# Patient Record
Sex: Male | Born: 1955
Health system: Southern US, Community
[De-identification: ages and names within clinical notes are randomized; demographics above are authoritative.]

## PROBLEM LIST (undated history)

## (undated) DIAGNOSIS — Z87442 Personal history of urinary calculi: Secondary | ICD-10-CM

## (undated) DIAGNOSIS — E46 Unspecified protein-calorie malnutrition: Secondary | ICD-10-CM

## (undated) DIAGNOSIS — Z9221 Personal history of antineoplastic chemotherapy: Secondary | ICD-10-CM

## (undated) DIAGNOSIS — M1990 Unspecified osteoarthritis, unspecified site: Secondary | ICD-10-CM

## (undated) DIAGNOSIS — F329 Major depressive disorder, single episode, unspecified: Secondary | ICD-10-CM

## (undated) DIAGNOSIS — R011 Cardiac murmur, unspecified: Secondary | ICD-10-CM

## (undated) DIAGNOSIS — N289 Disorder of kidney and ureter, unspecified: Secondary | ICD-10-CM

## (undated) DIAGNOSIS — Z923 Personal history of irradiation: Secondary | ICD-10-CM

## (undated) DIAGNOSIS — R0600 Dyspnea, unspecified: Secondary | ICD-10-CM

## (undated) DIAGNOSIS — R682 Dry mouth, unspecified: Secondary | ICD-10-CM

## (undated) DIAGNOSIS — E039 Hypothyroidism, unspecified: Secondary | ICD-10-CM

## (undated) DIAGNOSIS — F17200 Nicotine dependence, unspecified, uncomplicated: Secondary | ICD-10-CM

## (undated) DIAGNOSIS — I2510 Atherosclerotic heart disease of native coronary artery without angina pectoris: Secondary | ICD-10-CM

## (undated) DIAGNOSIS — K117 Disturbances of salivary secretion: Secondary | ICD-10-CM

## (undated) DIAGNOSIS — C109 Malignant neoplasm of oropharynx, unspecified: Secondary | ICD-10-CM

## (undated) DIAGNOSIS — I10 Essential (primary) hypertension: Secondary | ICD-10-CM

## (undated) DIAGNOSIS — Z972 Presence of dental prosthetic device (complete) (partial): Secondary | ICD-10-CM

## (undated) DIAGNOSIS — K08109 Complete loss of teeth, unspecified cause, unspecified class: Secondary | ICD-10-CM

## (undated) HISTORY — PX: APPENDECTOMY: SHX54

## (undated) HISTORY — PX: BIOPSY TONGUE: PRO39

## (undated) HISTORY — PX: HERNIA REPAIR: SHX51

## (undated) HISTORY — DX: Malignant neoplasm of oropharynx, unspecified: C10.9

## (undated) HISTORY — DX: Disorder of kidney and ureter, unspecified: N28.9

## (undated) HISTORY — PX: TONSILLECTOMY: SUR1361

---

## 1998-11-21 ENCOUNTER — Encounter: Payer: Self-pay | Admitting: *Deleted

## 1998-11-21 ENCOUNTER — Ambulatory Visit (HOSPITAL_COMMUNITY): Admission: RE | Admit: 1998-11-21 | Discharge: 1998-11-21 | Payer: Self-pay | Admitting: *Deleted

## 2000-06-20 ENCOUNTER — Encounter: Payer: Self-pay | Admitting: General Surgery

## 2000-06-22 ENCOUNTER — Ambulatory Visit (HOSPITAL_COMMUNITY): Admission: RE | Admit: 2000-06-22 | Discharge: 2000-06-22 | Payer: Self-pay | Admitting: General Surgery

## 2005-11-19 ENCOUNTER — Ambulatory Visit: Payer: Self-pay | Admitting: Family Medicine

## 2005-11-19 ENCOUNTER — Encounter: Admission: RE | Admit: 2005-11-19 | Discharge: 2005-11-19 | Payer: Self-pay | Admitting: Family Medicine

## 2005-11-22 ENCOUNTER — Ambulatory Visit: Payer: Self-pay | Admitting: Family Medicine

## 2010-03-01 DIAGNOSIS — C109 Malignant neoplasm of oropharynx, unspecified: Secondary | ICD-10-CM

## 2010-03-01 DIAGNOSIS — N289 Disorder of kidney and ureter, unspecified: Secondary | ICD-10-CM

## 2010-03-01 HISTORY — DX: Malignant neoplasm of oropharynx, unspecified: C10.9

## 2010-03-01 HISTORY — DX: Disorder of kidney and ureter, unspecified: N28.9

## 2010-08-10 ENCOUNTER — Emergency Department (HOSPITAL_COMMUNITY): Payer: BC Managed Care – PPO

## 2010-08-10 ENCOUNTER — Encounter (HOSPITAL_COMMUNITY): Payer: Self-pay | Admitting: Radiology

## 2010-08-10 ENCOUNTER — Inpatient Hospital Stay (INDEPENDENT_AMBULATORY_CARE_PROVIDER_SITE_OTHER)
Admission: RE | Admit: 2010-08-10 | Discharge: 2010-08-10 | Disposition: A | Discharge status: Home / Self Care | Payer: Self-pay | Source: Ambulatory Visit | Attending: Emergency Medicine | Admitting: Emergency Medicine

## 2010-08-10 ENCOUNTER — Inpatient Hospital Stay (HOSPITAL_COMMUNITY)
Admission: EM | Admit: 2010-08-10 | Discharge: 2010-08-14 | DRG: 270 | Disposition: A | Discharge status: Home / Self Care | Payer: BC Managed Care – PPO | Attending: Otolaryngology | Admitting: Otolaryngology

## 2010-08-10 DIAGNOSIS — C4442 Squamous cell carcinoma of skin of scalp and neck: Principal | ICD-10-CM | POA: Diagnosis present

## 2010-08-10 DIAGNOSIS — F17200 Nicotine dependence, unspecified, uncomplicated: Secondary | ICD-10-CM | POA: Diagnosis present

## 2010-08-10 DIAGNOSIS — M542 Cervicalgia: Secondary | ICD-10-CM

## 2010-08-10 LAB — DIFFERENTIAL
Basophils Absolute: 0 10*3/uL (ref 0.0–0.1)
Basophils Relative: 0 % (ref 0–1)
Eosinophils Absolute: 0.1 10*3/uL (ref 0.0–0.7)
Eosinophils Relative: 0 % (ref 0–5)
Lymphocytes Relative: 11 % — ABNORMAL LOW (ref 12–46)
Lymphs Abs: 1.8 10*3/uL (ref 0.7–4.0)
Monocytes Absolute: 0.8 10*3/uL (ref 0.1–1.0)
Monocytes Relative: 5 % (ref 3–12)
Neutro Abs: 13.6 10*3/uL — ABNORMAL HIGH (ref 1.7–7.7)
Neutrophils Relative %: 84 % — ABNORMAL HIGH (ref 43–77)

## 2010-08-10 LAB — POCT I-STAT, CHEM 8
BUN: 16 mg/dL (ref 6–23)
Calcium, Ion: 1.14 mmol/L (ref 1.12–1.32)
Chloride: 100 mEq/L (ref 96–112)
Creatinine, Ser: 1.2 mg/dL (ref 0.4–1.5)
Glucose, Bld: 149 mg/dL — ABNORMAL HIGH (ref 70–99)
HCT: 45 % (ref 39.0–52.0)
Hemoglobin: 15.3 g/dL (ref 13.0–17.0)
Potassium: 3.8 mEq/L (ref 3.5–5.1)
Sodium: 135 mEq/L (ref 135–145)
TCO2: 25 mmol/L (ref 0–100)

## 2010-08-10 LAB — PROTIME-INR
INR: 1.1 (ref 0.00–1.49)
Prothrombin Time: 14.4 seconds (ref 11.6–15.2)

## 2010-08-10 LAB — CBC
HCT: 41.3 % (ref 39.0–52.0)
Hemoglobin: 14.5 g/dL (ref 13.0–17.0)
MCHC: 35.1 g/dL (ref 30.0–36.0)
RBC: 4.83 MIL/uL (ref 4.22–5.81)
WBC: 16.2 10*3/uL — ABNORMAL HIGH (ref 4.0–10.5)

## 2010-08-10 LAB — APTT: aPTT: 29 seconds (ref 24–37)

## 2010-08-10 MED ORDER — IOHEXOL 300 MG/ML  SOLN: 75.0000 mL | Freq: Once | INTRAMUSCULAR | Status: AC | PRN

## 2010-08-11 LAB — CBC
HCT: 36.6 % — ABNORMAL LOW (ref 39.0–52.0)
Hemoglobin: 12.5 g/dL — ABNORMAL LOW (ref 13.0–17.0)
MCHC: 34.2 g/dL (ref 30.0–36.0)
RBC: 4.29 MIL/uL (ref 4.22–5.81)
WBC: 13.1 10*3/uL — ABNORMAL HIGH (ref 4.0–10.5)

## 2010-08-11 LAB — DIFFERENTIAL
Basophils Absolute: 0 10*3/uL (ref 0.0–0.1)
Basophils Relative: 0 % (ref 0–1)
Lymphocytes Relative: 18 % (ref 12–46)
Neutro Abs: 9.2 10*3/uL — ABNORMAL HIGH (ref 1.7–7.7)
Neutrophils Relative %: 70 % (ref 43–77)

## 2010-08-12 LAB — CBC
HCT: 38.1 % — ABNORMAL LOW (ref 39.0–52.0)
Hemoglobin: 13 g/dL (ref 13.0–17.0)
MCH: 29.3 pg (ref 26.0–34.0)
MCHC: 34.1 g/dL (ref 30.0–36.0)
RBC: 4.43 MIL/uL (ref 4.22–5.81)

## 2010-08-12 LAB — DIFFERENTIAL
Basophils Relative: 0 % (ref 0–1)
Lymphocytes Relative: 15 % (ref 12–46)
Monocytes Absolute: 0.8 10*3/uL (ref 0.1–1.0)
Monocytes Relative: 8 % (ref 3–12)
Neutro Abs: 7.9 10*3/uL — ABNORMAL HIGH (ref 1.7–7.7)
Neutrophils Relative %: 76 % (ref 43–77)

## 2010-08-13 ENCOUNTER — Other Ambulatory Visit: Payer: Self-pay | Admitting: Otolaryngology

## 2010-08-13 LAB — CBC
Hemoglobin: 14 g/dL (ref 13.0–17.0)
MCH: 29.4 pg (ref 26.0–34.0)
MCHC: 33.7 g/dL (ref 30.0–36.0)
MCV: 87.2 fL (ref 78.0–100.0)
RBC: 4.77 MIL/uL (ref 4.22–5.81)

## 2010-08-13 LAB — DIFFERENTIAL
Basophils Relative: 0 % (ref 0–1)
Eosinophils Absolute: 0.2 10*3/uL (ref 0.0–0.7)
Lymphs Abs: 2.5 10*3/uL (ref 0.7–4.0)
Monocytes Absolute: 0.8 10*3/uL (ref 0.1–1.0)
Monocytes Relative: 7 % (ref 3–12)
Neutro Abs: 8.6 10*3/uL — ABNORMAL HIGH (ref 1.7–7.7)
Neutrophils Relative %: 71 % (ref 43–77)

## 2010-08-14 LAB — CBC
MCV: 86.5 fL (ref 78.0–100.0)
Platelets: 243 10*3/uL (ref 150–400)
RBC: 4.16 MIL/uL — ABNORMAL LOW (ref 4.22–5.81)
RDW: 13 % (ref 11.5–15.5)
WBC: 11.5 10*3/uL — ABNORMAL HIGH (ref 4.0–10.5)

## 2010-08-14 LAB — DIFFERENTIAL
Basophils Absolute: 0 10*3/uL (ref 0.0–0.1)
Basophils Relative: 0 % (ref 0–1)
Eosinophils Absolute: 0.2 10*3/uL (ref 0.0–0.7)
Eosinophils Relative: 1 % (ref 0–5)
Neutrophils Relative %: 74 % (ref 43–77)

## 2010-08-16 LAB — TISSUE CULTURE

## 2010-08-17 ENCOUNTER — Other Ambulatory Visit (HOSPITAL_COMMUNITY): Payer: Self-pay | Admitting: Otolaryngology

## 2010-08-17 DIAGNOSIS — C760 Malignant neoplasm of head, face and neck: Secondary | ICD-10-CM

## 2010-08-25 ENCOUNTER — Encounter (HOSPITAL_COMMUNITY): Payer: Self-pay

## 2010-08-25 ENCOUNTER — Encounter (HOSPITAL_COMMUNITY)
Admission: RE | Admit: 2010-08-25 | Discharge: 2010-08-25 | Disposition: A | Discharge status: Home / Self Care | Payer: BC Managed Care – PPO | Source: Ambulatory Visit | Attending: Otolaryngology | Admitting: Otolaryngology

## 2010-08-25 DIAGNOSIS — C770 Secondary and unspecified malignant neoplasm of lymph nodes of head, face and neck: Secondary | ICD-10-CM | POA: Insufficient documentation

## 2010-08-25 DIAGNOSIS — C760 Malignant neoplasm of head, face and neck: Secondary | ICD-10-CM

## 2010-08-25 DIAGNOSIS — C801 Malignant (primary) neoplasm, unspecified: Secondary | ICD-10-CM | POA: Insufficient documentation

## 2010-08-25 DIAGNOSIS — K5730 Diverticulosis of large intestine without perforation or abscess without bleeding: Secondary | ICD-10-CM | POA: Insufficient documentation

## 2010-08-25 DIAGNOSIS — R599 Enlarged lymph nodes, unspecified: Secondary | ICD-10-CM | POA: Insufficient documentation

## 2010-08-25 LAB — GLUCOSE, CAPILLARY: Glucose-Capillary: 113 mg/dL — ABNORMAL HIGH (ref 70–99)

## 2010-08-25 MED ORDER — FLUDEOXYGLUCOSE F - 18 (FDG) INJECTION
18.6000 | Freq: Once | INTRAVENOUS | Status: AC | PRN
  Administered 2010-08-25: 18.6 via INTRAVENOUS

## 2010-09-04 ENCOUNTER — Other Ambulatory Visit: Payer: Self-pay | Admitting: Otolaryngology

## 2010-09-04 ENCOUNTER — Ambulatory Visit (HOSPITAL_COMMUNITY)
Admission: RE | Admit: 2010-09-04 | Discharge: 2010-09-04 | Disposition: A | Discharge status: Home / Self Care | Payer: BC Managed Care – PPO | Source: Ambulatory Visit | Attending: Otolaryngology | Admitting: Otolaryngology

## 2010-09-04 DIAGNOSIS — C801 Malignant (primary) neoplasm, unspecified: Secondary | ICD-10-CM | POA: Insufficient documentation

## 2010-09-04 DIAGNOSIS — F17200 Nicotine dependence, unspecified, uncomplicated: Secondary | ICD-10-CM | POA: Insufficient documentation

## 2010-09-04 DIAGNOSIS — K219 Gastro-esophageal reflux disease without esophagitis: Secondary | ICD-10-CM | POA: Insufficient documentation

## 2010-09-04 DIAGNOSIS — C760 Malignant neoplasm of head, face and neck: Secondary | ICD-10-CM | POA: Insufficient documentation

## 2010-09-04 DIAGNOSIS — Z01812 Encounter for preprocedural laboratory examination: Secondary | ICD-10-CM | POA: Insufficient documentation

## 2010-09-04 LAB — CBC
MCH: 29.3 pg (ref 26.0–34.0)
MCHC: 34.1 g/dL (ref 30.0–36.0)
RBC: 4.98 MIL/uL (ref 4.22–5.81)
RDW: 14.5 % (ref 11.5–15.5)
WBC: 9.1 10*3/uL (ref 4.0–10.5)

## 2010-09-16 ENCOUNTER — Ambulatory Visit
Admission: RE | Admit: 2010-09-16 | Discharge: 2010-09-16 | Disposition: A | Discharge status: Home / Self Care | Payer: BC Managed Care – PPO | Source: Ambulatory Visit | Attending: Radiation Oncology | Admitting: Radiation Oncology

## 2010-09-16 DIAGNOSIS — C779 Secondary and unspecified malignant neoplasm of lymph node, unspecified: Secondary | ICD-10-CM | POA: Insufficient documentation

## 2010-09-16 DIAGNOSIS — C801 Malignant (primary) neoplasm, unspecified: Secondary | ICD-10-CM | POA: Insufficient documentation

## 2010-09-16 DIAGNOSIS — Z510 Encounter for antineoplastic radiation therapy: Secondary | ICD-10-CM | POA: Insufficient documentation

## 2010-09-16 DIAGNOSIS — B977 Papillomavirus as the cause of diseases classified elsewhere: Secondary | ICD-10-CM | POA: Insufficient documentation

## 2010-09-16 DIAGNOSIS — F17200 Nicotine dependence, unspecified, uncomplicated: Secondary | ICD-10-CM | POA: Insufficient documentation

## 2010-09-16 DIAGNOSIS — C50919 Malignant neoplasm of unspecified site of unspecified female breast: Secondary | ICD-10-CM | POA: Insufficient documentation

## 2010-09-18 ENCOUNTER — Other Ambulatory Visit (HOSPITAL_COMMUNITY): Payer: Self-pay | Admitting: Radiation Oncology

## 2010-09-21 ENCOUNTER — Other Ambulatory Visit (HOSPITAL_COMMUNITY): Payer: Self-pay | Admitting: Dentistry

## 2010-09-21 ENCOUNTER — Other Ambulatory Visit (HOSPITAL_COMMUNITY): Payer: Self-pay | Admitting: Radiation Oncology

## 2010-09-21 ENCOUNTER — Other Ambulatory Visit: Payer: Self-pay | Admitting: Oncology

## 2010-09-21 ENCOUNTER — Encounter (HOSPITAL_BASED_OUTPATIENT_CLINIC_OR_DEPARTMENT_OTHER): Payer: Self-pay | Admitting: Oncology

## 2010-09-21 DIAGNOSIS — C770 Secondary and unspecified malignant neoplasm of lymph nodes of head, face and neck: Secondary | ICD-10-CM

## 2010-09-21 LAB — COMPREHENSIVE METABOLIC PANEL
ALT: 14 U/L (ref 0–53)
AST: 9 U/L (ref 0–37)
Albumin: 3.9 g/dL (ref 3.5–5.2)
Calcium: 8.9 mg/dL (ref 8.4–10.5)
Chloride: 107 mEq/L (ref 96–112)
Creatinine, Ser: 0.99 mg/dL (ref 0.50–1.35)
Potassium: 4.3 mEq/L (ref 3.5–5.3)
Sodium: 141 mEq/L (ref 135–145)
Total Protein: 6.6 g/dL (ref 6.0–8.3)

## 2010-09-21 LAB — CBC WITH DIFFERENTIAL/PLATELET
BASO%: 0.4 % (ref 0.0–2.0)
EOS%: 1.8 % (ref 0.0–7.0)
MCH: 29.4 pg (ref 27.2–33.4)
MCHC: 33.7 g/dL (ref 32.0–36.0)
RBC: 4.77 10*6/uL (ref 4.20–5.82)
RDW: 14.7 % — ABNORMAL HIGH (ref 11.0–14.6)
WBC: 10 10*3/uL (ref 4.0–10.3)
lymph#: 2 10*3/uL (ref 0.9–3.3)

## 2010-09-22 ENCOUNTER — Other Ambulatory Visit (HOSPITAL_COMMUNITY): Payer: Self-pay | Admitting: Radiation Oncology

## 2010-09-22 ENCOUNTER — Other Ambulatory Visit (HOSPITAL_COMMUNITY): Payer: Self-pay | Admitting: Dentistry

## 2010-09-22 ENCOUNTER — Other Ambulatory Visit: Payer: Self-pay | Admitting: Radiation Oncology

## 2010-09-22 DIAGNOSIS — K029 Dental caries, unspecified: Secondary | ICD-10-CM

## 2010-09-22 DIAGNOSIS — K0530 Chronic periodontitis, unspecified: Secondary | ICD-10-CM

## 2010-09-22 DIAGNOSIS — K036 Deposits [accretions] on teeth: Secondary | ICD-10-CM

## 2010-09-23 ENCOUNTER — Other Ambulatory Visit (HOSPITAL_COMMUNITY): Payer: Self-pay | Admitting: Radiation Oncology

## 2010-09-23 NOTE — Op Note (Signed)
[name] [name], [name] [name] NO.:  [id number]  MEDICAL RECORD NO.:  [id number]  LOCATION:                                 FACILITY:  PHYSICIAN:  Antony Contras, MD     DATE OF BIRTH:  January 09, 1956  DATE OF PROCEDURE: DATE OF DISCHARGE:                              OPERATIVE REPORT   PREOPERATIVE DIAGNOSES:  Right neck mass and infection.  POSTOPERATIVE DIAGNOSES:  Right neck mass and infection.  PROCEDURE:  Incision and drainage of right neck infection.  SURGEON:  Excell Seltzer. Jenne Pane, MD  ANESTHESIA:  General endotracheal anesthesia.  COMPLICATIONS:  None.  INDICATIONS:  The patient is a 55 year old white male who was admitted to the hospital a few days ago with a 1-week history of a right neck mass that was tender.  It got a little better on its own, but he came to emergency room because of persistence.  He had had some fever and chills with it as well.  A CT scan was performed in the emergency department showing a phlegmonous mass of the right jugular chain inferiorly with central necrosis suggesting abscess formation.  He is admitted to hospital and placed on intravenous Unasyn and has been on that for a couple of days without much improvement in the neck mass.  Thus, he presents to the operating room for incision and drainage.  FINDINGS:  The patient has a firm mass of the right lower neck deep to the sternocleidomastoid muscle.  Dissection was performed down to the area of the mass, and the firm area was entered by dissection, but no purulent pocket was encountered.  The area was probed in several angles with blunt dissection without any pus encountered.  Portions of the tissue in that area were removed and sent for both tissue culture and pathology.  DESCRIPTION OF PROCEDURE:  The patient was identified in the holding room and informed consent having been obtained including discussion of risks, benefits, alternatives, the patient was brought to the  operative suite and put on the operative table in the supine position.  Anesthesia was induced, and the patient was intubated by anesthesia team without difficulty.  The patient had been receiving intravenous antibiotics in the hospital.  The incision on the right side of the neck was marked with a marking pen and injected with 1% lidocaine with 1:100,000 of epinephrine.  The right neck was then prepped and draped in sterile fashion.  Incision was made with a 15 blade scalpel through the skin and extended through subcutaneous and platysma layers using Bovie electrocautery.  Subplatysmal flaps were elevated superiorly and inferiorly as, and a self-retaining retractor was added.  Dissection was then performed down along the anterior edge of the sternocleidomastoid muscle and then deep to the muscle.  Dissection was then performed along the deep surface of the muscle into the area of firm edema using hemostats and Bovie electrocautery.  Several penetrations of the deep layer were made with the hemostats and spread with no pus pocket encountered.  Some of the firm surrounding tissue was then removed with scissors and sent for pathology and tissue culture.  The neck was palpated on the  outside while probing on the inside, and it was felt that the cavity was opened into the area of firmness.  Without any pus encountered, the wound was copiously irrigated with saline, and quarter- inch Penrose drain was placed and secured to the skin using 2-0 nylon suture.  Much of the platysma muscle was then reestablished using 4-0 Vicryl suture in a simple running fashion.  The skin was then closed with 5-0 nylon in a simple interrupted fashion.  The anterior part of the incision opened with a Penrose drain in place.  The patient was then cleaned off, and Kerlix fluff dressing was placed around the neck.  The patient was returned to Anesthesia for wake up, was extubated, and moved to recovery room in stable  condition.     Antony Contras, MD     DDB/MEDQ  D:  08/13/2010  T:  08/14/2010  Job:  [id number]  Electronically Signed by Christia Reading MD on 09/23/2010 08:11:22 PM

## 2010-09-23 NOTE — Op Note (Signed)
[name] [name], [name] NO.:  [id number]  MEDICAL RECORD NO.:  [id number]  LOCATION:  [facility]                         FACILITY:  [facility]  PHYSICIAN:  Antony Contras, MD     DATE OF BIRTH:  [birth year]  DATE OF PROCEDURE:  09/04/2010 DATE OF DISCHARGE:                              OPERATIVE REPORT   PREOPERATIVE DIAGNOSIS:  Right neck carcinoma with unknown primary.  POSTOPERATIVE DIAGNOSIS:  Right neck carcinoma with unknown primary.  PROCEDURES: 1. Direct laryngoscopy with biopsy. 2. Esophagoscopy. 3. Right tonsillectomy.  SURGEON:  Antony Contras, MD  ANESTHESIA:  General endotracheal anesthesia.  COMPLICATIONS:  None.  INDICATION:  The patient is a 55 year old white male who was recently admitted into the hospital with what was thought to be a right neck abscess, who underwent incision and drainage yielding no pus.  A biopsy of the tissue was performed, which came back as carcinoma that stained positive for P16 markers.  He thus presents to the operating room today for examination under anesthesia and directed biopsies.  This is occurring following a PET scan that shows hypermetabolic activity in both tonsils, a bit more intense in the right side and a little bit of activity in the supraglottis as well as the known neck nodes.  FINDINGS:  Both tonsils were rubbery, firm to palpation without much asymmetry to them.  The right tonsil was removed and sent for biopsy. The right tongue base was a bit firm and a bit friable and biopsies were taken from this site.  The remainder of the pharynx, larynx, and esophagus were without abnormality.  DESCRIPTION OF PROCEDURE:  The patient was identified in the holding room and informed consent having been obtained including discussion of risks, benefits, and alternatives, the patient was brought to the operating suite and laid on the table in supine position.  Anesthesia was induced and the patient was intubated by  the anesthesia team without difficulty.  The eyes were taped closed and bed was turned to 90 degrees from anesthesia.  A head wrap was placed around the patient's head and a tooth guard was placed.  A Dedo laryngoscope was then placed and used to evaluate the various areas of the pharynx and larynx including the piriform sinuses, postcricoid region, endolarynx, vallecula, and pharynx.  The right tongue base was found to be a bit friable and firm and so biopsies were taken with cup forceps and passed to nursing for Pathology.  After this was completed, the cervical esophagoscope was inserted and passed down the esophagus keeping the lumen in view down to the depth of the esophagoscope and then was carefully backed out examining the esophagus and no abnormalities were seen.  At this point, the tooth guard was removed and the endotracheal tube was moved to a medial position.  A Crowe-Davis retractor was inserted and opened to reveal the oropharynx.  This was placed in suspension on a Mayo stand. The right tonsil was then grasped with a curved Allis and retracted medially, while a curvilinear incision was made along the anterior tonsillar pillar using Bovie electrocautery in the setting of 20. Dissection continued in the subcapsular plane until the tonsil  was removed.  The tonsil was passed to nursing for Pathology.  Bleeding was then controlled with suction cautery on a setting of 30.  Tonsillar fossa was then closed with 3-0 Vicryl suture in a simple interrupted fashion.  This was done in order to try to encourage quick healing so that he can get onto radiation chemotherapy should that be needed.  At this point, the nose and throat were copiously irrigated with saline and the throat was suctioned.  A nasogastric tube was passed down the esophagus to suction the stomach and esophagus.  The retractors were then taken off suspension and removed from the patient's mouth.  He was turned back to  anesthesia for wake up, was extubated, and moved to the recovery room in stable condition.     Antony Contras, MD     DDB/MEDQ  D:  09/04/2010  T:  09/04/2010  Job:  [id number]  Electronically Signed by Christia Reading MD on 09/23/2010 08:11:27 PM

## 2010-09-23 NOTE — H&P (Signed)
[name] [name], KRAYNAK NO.:  [id number]  MEDICAL RECORD NO.:  [id number]  LOCATION:  5159                         FACILITY:  [facility]  PHYSICIAN:  Antony Contras, MD     DATE OF BIRTH:  1955/09/19  DATE OF ADMISSION:  08/10/2010 DATE OF DISCHARGE:  08/10/2010                             HISTORY & PHYSICAL   CHIEF COMPLAINT:  Right neck swelling.  HISTORY OF PRESENT ILLNESS:  The patient is a 55 year old white male who awoke with swelling in the right lower neck about 1 week ago that was tender associated with right ear pain.  It was worse last week than it is now, but has persisted for the whole week.  He spent a lot of the week in bed because of fever and chills, but did not seek medical attention.  He has not been on any treatment.  He came to the emergency room this evening because of the problem persisting not because it was particularly worse.  He denies dysphagia, odynophagia, dyspnea, and hoarseness.  He denies any difficulty with movement of the neck.  He has been taking aspirin at home.  He reports a history of some extensive dental cleaning and fillings within 1 month ago.  He has no other complaints.  PAST MEDICAL HISTORY:  None.  PAST SURGICAL HISTORY:  Appendectomy and facial trauma surgery.  MEDICATIONS:  None.  ALLERGIES:  No known drug allergies.  FAMILY HISTORY:  Diabetes, leukemia, and lung cancer.  SOCIAL HISTORY:  He admits to one pack per day cigarette habit with 40 years.  He denies alcohol or drug use.  REVIEW OF SYSTEMS:  Negative except as listed above.  PHYSICAL EXAMINATION:  VITAL SIGNS:  Afebrile, vital signs stable. GENERAL:  The patient is in no acute distress and is pleasant and cooperative. VOICE:  Normal. EYES:  Extraocular movements are intact.  Pupils are equal, round, and reactive to light. NOSE:  External nose is normal.  Nasal passages are patent with a relatively midline septum. EARS:  External ears are normal.   External canals are patent.  Tympanic membranes are intact.  Middle ear spaces are aerated. HEARING:  Grossly normal. ORAL CAVITY/OROPHARYNX:  Lips, teeth, and gums are normal.  The tongue and floor of mouth are normal.  The oropharynx is normal.  Tonsils are small. NECK:  There is firm edema in the right lower sternocleidomastoid muscle region that is tender to palpation.  The remainder of the neck is nontender without deformity.  The laryngotracheal structures are deviated toward the left slightly. LYMPHATICS:  There are no palpable enlarged lymph nodes in the neck. THYROID:  Normal to palpation. SALIVARY GLANDS:  Normal to palpation. CRANIAL NERVES:  II through XII grossly intact.  RADIOLOGIC EXAM:  A neck CT with contrast was personally reviewed and demonstrates a 4 x 2-cm area of heterogeneous appearance with appearance of central fluid deep to the right sternocleidomastoid muscle adjacent through the thyroid cartilage.  The area enhances on the scan.  There is an enlarged node in the right zone 2 region, which may have central necrosis on the scan.  LABORATORY DATA:  White blood count 16.2 with 84%  neutrophils.  ASSESSMENT:  The patient is a 55 year old white male with a history and exam suggesting a right lower neck infectious process which on imaging suggests early abscess formation and certainly phlegmon formation.  PLAN:  The patient has not been on any antibiotic therapy at this point. I will admit him to the hospital for intravenous antibiotic therapy starting with Unasyn 3 g every 6 hours.  I recommended that we do this before going to surgery as there is a possibility that the infection will respond to IV antibiotics at this point.  He will be closely observed and if there is no evidence of improvement after a couple of days on IV antibiotic, repeat imaging will be considered as well and incision and drainage.  The patient is comfortable with this  plan.     Antony Contras, MD     DDB/MEDQ  D:  08/11/2010  T:  08/11/2010  Job:  [id number]  Electronically Signed by Christia Reading MD on 09/23/2010 08:11:17 PM

## 2010-09-25 ENCOUNTER — Other Ambulatory Visit (HOSPITAL_COMMUNITY): Payer: Self-pay

## 2010-09-28 ENCOUNTER — Ambulatory Visit (HOSPITAL_COMMUNITY): Admission: RE | Admit: 2010-09-28 | Payer: Self-pay | Source: Ambulatory Visit | Admitting: Dentistry

## 2010-09-29 ENCOUNTER — Ambulatory Visit (HOSPITAL_COMMUNITY): Payer: Self-pay

## 2010-09-29 ENCOUNTER — Other Ambulatory Visit (HOSPITAL_COMMUNITY): Payer: Self-pay | Admitting: Radiation Oncology

## 2010-09-29 ENCOUNTER — Other Ambulatory Visit (HOSPITAL_COMMUNITY): Payer: Self-pay

## 2010-10-01 ENCOUNTER — Ambulatory Visit: Payer: BC Managed Care – PPO | Attending: Radiation Oncology

## 2010-10-01 ENCOUNTER — Ambulatory Visit (HOSPITAL_COMMUNITY)
Admission: RE | Admit: 2010-10-01 | Discharge: 2010-10-01 | Disposition: A | Discharge status: Home / Self Care | Payer: BC Managed Care – PPO | Source: Ambulatory Visit | Attending: Dentistry | Admitting: Dentistry

## 2010-10-01 DIAGNOSIS — C760 Malignant neoplasm of head, face and neck: Secondary | ICD-10-CM | POA: Insufficient documentation

## 2010-10-01 DIAGNOSIS — K0530 Chronic periodontitis, unspecified: Secondary | ICD-10-CM

## 2010-10-01 DIAGNOSIS — K009 Disorder of tooth development, unspecified: Secondary | ICD-10-CM

## 2010-10-01 DIAGNOSIS — M278 Other specified diseases of jaws: Secondary | ICD-10-CM | POA: Insufficient documentation

## 2010-10-01 DIAGNOSIS — F17200 Nicotine dependence, unspecified, uncomplicated: Secondary | ICD-10-CM | POA: Insufficient documentation

## 2010-10-01 DIAGNOSIS — K029 Dental caries, unspecified: Secondary | ICD-10-CM | POA: Insufficient documentation

## 2010-10-01 DIAGNOSIS — C801 Malignant (primary) neoplasm, unspecified: Secondary | ICD-10-CM | POA: Insufficient documentation

## 2010-10-02 ENCOUNTER — Other Ambulatory Visit (HOSPITAL_COMMUNITY): Payer: Self-pay

## 2010-10-02 ENCOUNTER — Ambulatory Visit (HOSPITAL_COMMUNITY): Payer: BC Managed Care – PPO

## 2010-10-02 NOTE — Op Note (Signed)
[name] [name]                ACCOUNT NO.:  [id number]  MEDICAL RECORD NO.:  [id number]  LOCATION:  [facility]                         FACILITY:  [facility]  PHYSICIAN:  Cindra Eves, D.D.S.DATE OF BIRTH:  1955/11/19  DATE OF PROCEDURE:  10/01/2010 DATE OF DISCHARGE:  10/01/2010                              OPERATIVE REPORT   PREOPERATIVE DIAGNOSES: 1. 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 2. Pre-chemoradiation therapy dental protocol. 3. Chronic periodontitis. 4. Dental caries. 5. Mandibular right lingual torus.  POSTOPERATIVE DIAGNOSES: 1. 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 2. Pre-chemoradiation therapy dental protocol. 3. Chronic periodontitis. 4. Dental caries. 5. Mandibular right lingual torus.  OPERATIONS: 1. Extraction of remaining teeth (tooth numbers 2, 3, 4, 5, 6, 7, 9,     10, 11, 12, 13, 14, 15, 18, 19, 20, 21, 22, 23, 24, 25, 26, 27, 28,     29, 30, 31). 2. Four quadrants of alveoloplasty. 3. Mandibular right lingual torus reduction.  SURGEON:  Cindra Eves, D.D.S.  ASSISTANT:  Zettie Pho (Sales executive.)  ANESTHESIA:  General anesthesia via nasoendotracheal tube.  MEDICATIONS: 1. Ancef 2 g IV prior to invasive dental procedures. 2. Local anesthesia with a total utilization of 4 carpules each     containing 34 mg of lidocaine with 0.017 mg of epinephrine as well     as 4 carpules each containing 9 mg of bupivacaine with 0.009 mg of     epinephrine. 3. Toradol 30 mg IV at the end of the dental medicine procedure.  SPECIMENS:  There were 28 teeth that were discarded.  DRAINS:  None.  CULTURES:  None.  COMPLICATIONS:  None.  ESTIMATED BLOOD LOSS:  100 mL.  FLUIDS:  1600 mL lactated Ringer solution.  INDICATIONS:  The patient was recently diagnosed with squamous cell carcinoma involving the right neck with an unknown primary tumor.  The patient with  anticipated chemoradiation therapy.  The patient was seen as part of the pre-chemoradiation therapy dental protocol evaluation. The patient was examined and treatment planned for multiple extractions of remaining teeth with alveoloplasty and preprosthetic surgery as indicated. This treatment plan was formulated to decrease the risk and complications from dental infection that would affect the patient's systemic health while undergoing active chemoradiation therapy as well as to prevent future complications such as infection or osteoradionecrosis.  OPERATIVE FINDINGS:  The patient was examined in the operating room #6. The teeth were identified for extraction.  The patient noted be affected by chronic periodontitis, multiple dental caries, tooth mobility, and the presence of mandibular right lingual torus.  DESCRIPTION OF PROCEDURE:  The patient was brought to the main operating room #6.  The patient was then placed in the supine position on the operating room table.  General anesthesia then induced via nasoendotracheal tube.  The patient was then prepped and draped in usual manner for dental medicine procedure.  Time-out was performed.  The patient was identified, procedures were verified.  A throat pack was placed at this time.  The oral cavity was then thoroughly examined with findings noted above.  The patient was then ready for the dental medicine procedure as follows:  Local anesthesia was administered sequentially with a total utilization of 4 carpules each containing 34 mg of lidocaine with 0.017 mg of epinephrine as well as 4 carpules each containing 9 mg of bupivacaine with 0.009 mg of epinephrine.  The maxillary left and right quadrants first approached.  Anesthesia was delivered via infiltration utilizing the lidocaine with epinephrine.  At this point in time, a 15 blade incision was made from the maxillary right tuberosity and extended to the maxillary left tuberosity.   Surgical flap was then carefully reflected. Appropriate amounts of buccal and interseptal bone were removed with a surgical handpiece and bur and copious amounts of sterile saline.  The teeth were then subluxated with a series of straight elevators.  Tooth numbers 2 and 3 were then removed with a 53 R forceps without complications.  Tooth numbers 4, 5, 6, 7, 8, 9, 10, 11, 12, and 13 were then removed with a 150 forceps without complications.  Tooth numbers 14 and 15 were then removed with a 53 L forceps without complications. Alveoplasty was then performed utilizing rongeurs and bone file. Tissues were approximated and trimmed appropriately.  The surgical site was then irrigated with copious amounts of sterile saline x2.  The maxillary right surgical site was then closed from the maxillary right tuberosity and extended to the mesial #8 utilizing 3-0 chromic gut suture in a continuous interrupted suture technique x1.  The maxillary left surgical site was then closed from the maxillary left tuberosity and extended to the mesial #9 utilizing 3-0 chromic gut suture in a continuous interrupted suture technique x1.  At this point in time, the mandibular quadrants were approached.  The patient was given bilateral inferior alveolar nerve blocks and long buccal nerve blocks utilizing the bupivacaine with epinephrine.  Further infiltration was then achieved utilizing the bupivacaine with epinephrine.  At this point in time, 15 blade incision was made from the distal #32 and extended to the distal of #17.  A surgical flap was then carefully reflected.  Appropriate amounts of buccal and interseptal bone were removed with a surgical handpiece and bur as indicated along with sterile saline.  The teeth were then subluxated with a series of straight elevators.  Tooth numbers 18 and 19 were then removed with a 23 forceps without complications.  Tooth numbers 20, 21, 22, 23, 24, 25, 26, 27, 28, 29 were  then removed with a 151 forceps without complications.  The coronal aspect of tooth numbers 30 and 31 were then removed with a 23 forceps leaving the roots remaining.  Further bone was then removed as indicated and the roots were elevated out appropriately with a series of Cryer elevators.  At this point in time, further alveoloplasty was performed utilizing rongeurs and bone file.  The surgical handpiece, bur, and copious amounts of sterile saline were utilized to remove the mandibular right lingual torus at this time. Further alveoplasty was then performed utilizing rongeurs and bone file. The tissues were approximated and trimmed appropriately.  The surgical site was then irrigated with copious amounts of sterile saline x4.  The mandibular left surgical site was then closed from the distal of #17 and extended to the mesial #24 utilizing 3-0 chromic gut suture in a continuous interrupted suture technique x1.  One individual interrupted suture was placed to further close the surgical site.  At this point in time, the mandibular right surgical site was closed from the  mandibular right area #32 and extended to the mesial #25 utilizing 3-0 chromic gut suture in a continuous interrupted suture technique x1.    At this point in time, the entire mouth was irrigated with copious amounts of sterile saline.  The patient was examined for complications, seeing none, dental medicine procedure was deemed to be complete.  The throat pack was removed at this time.  A series of 4 x 4 gauze were placed in the mouth to aid hemostasis.  An oral airway was then placed at the request of the  Anesthesia Team.  The patient was then handed over to the anesthesia team for final disposition.  The patient was given 30 mg of Toradol IV at this time for pain control.  The patient was then extubated appropriately.  The patient was then taken to the post anesthesia care unit with stable vital signs, good oxygenation  level.  All counts were correct for the dental medicine procedure.  The patient will be placed on appropriate pain medication, will be seen in approximately 7-10 days for evaluation for suture removal.  The patient should be ready to start radiation therapy approximately on October 15, 2010, at the discretion of Dr. Basilio Cairo.          ______________________________ Cindra Eves, D.D.S.     RK/MEDQ  D:  10/01/2010  T:  10/02/2010  Job:  [id number]  cc:   Grayland Jack, M.D. Fax: [phone]  Antony Contras, MD Fax: [phone]  Exie Parody, M.D.  Dr. Onalee Hua Mandalinich-Primary Dentist  Electronically Signed by Cindra Eves D.D.S. on 10/02/2010 08:29:09 AM

## 2010-10-05 ENCOUNTER — Inpatient Hospital Stay (HOSPITAL_COMMUNITY): Admission: RE | Admit: 2010-10-05 | Payer: Self-pay | Source: Ambulatory Visit

## 2010-10-05 ENCOUNTER — Ambulatory Visit (HOSPITAL_COMMUNITY): Admission: RE | Admit: 2010-10-05 | Payer: Self-pay | Source: Ambulatory Visit

## 2010-10-06 ENCOUNTER — Other Ambulatory Visit (HOSPITAL_COMMUNITY): Payer: Self-pay | Admitting: Radiation Oncology

## 2010-10-08 DIAGNOSIS — K08109 Complete loss of teeth, unspecified cause, unspecified class: Secondary | ICD-10-CM

## 2010-10-09 ENCOUNTER — Ambulatory Visit (HOSPITAL_COMMUNITY): Admit: 2010-10-09 | Payer: Self-pay | Admitting: *Deleted

## 2010-10-09 ENCOUNTER — Ambulatory Visit (HOSPITAL_COMMUNITY)
Admission: RE | Admit: 2010-10-09 | Discharge: 2010-10-09 | Disposition: A | Discharge status: Home / Self Care | Payer: BC Managed Care – PPO | Source: Ambulatory Visit | Attending: Radiation Oncology | Admitting: Radiation Oncology

## 2010-10-09 DIAGNOSIS — R1310 Dysphagia, unspecified: Secondary | ICD-10-CM | POA: Insufficient documentation

## 2010-10-12 ENCOUNTER — Ambulatory Visit (HOSPITAL_COMMUNITY): Payer: Self-pay

## 2010-10-13 ENCOUNTER — Ambulatory Visit (HOSPITAL_COMMUNITY): Payer: BC Managed Care – PPO

## 2010-10-13 ENCOUNTER — Ambulatory Visit (HOSPITAL_COMMUNITY)
Admission: RE | Admit: 2010-10-13 | Discharge: 2010-10-13 | Disposition: A | Discharge status: Home / Self Care | Payer: BC Managed Care – PPO | Source: Ambulatory Visit | Attending: Radiation Oncology | Admitting: Radiation Oncology

## 2010-10-13 DIAGNOSIS — C760 Malignant neoplasm of head, face and neck: Secondary | ICD-10-CM | POA: Insufficient documentation

## 2010-10-13 DIAGNOSIS — F17200 Nicotine dependence, unspecified, uncomplicated: Secondary | ICD-10-CM | POA: Insufficient documentation

## 2010-10-13 LAB — CBC
MCHC: 33.3 g/dL (ref 30.0–36.0)
Platelets: 197 10*3/uL (ref 150–400)
RDW: 15 % (ref 11.5–15.5)

## 2010-10-13 LAB — PROTIME-INR
INR: 0.97 (ref 0.00–1.49)
Prothrombin Time: 13.1 seconds (ref 11.6–15.2)

## 2010-10-13 MED ORDER — IOHEXOL 300 MG/ML  SOLN
50.0000 mL | Freq: Once | INTRAMUSCULAR | Status: AC | PRN
  Administered 2010-10-13: 20 mL via INTRATHECAL

## 2010-10-15 ENCOUNTER — Encounter (HOSPITAL_BASED_OUTPATIENT_CLINIC_OR_DEPARTMENT_OTHER): Payer: BC Managed Care – PPO | Admitting: Oncology

## 2010-10-15 ENCOUNTER — Other Ambulatory Visit: Payer: Self-pay | Admitting: Oncology

## 2010-10-15 DIAGNOSIS — C109 Malignant neoplasm of oropharynx, unspecified: Secondary | ICD-10-CM

## 2010-10-15 DIAGNOSIS — C770 Secondary and unspecified malignant neoplasm of lymph nodes of head, face and neck: Secondary | ICD-10-CM

## 2010-10-15 LAB — BASIC METABOLIC PANEL
Chloride: 107 mEq/L (ref 96–112)
Creatinine, Ser: 0.98 mg/dL (ref 0.50–1.35)
Potassium: 4.1 mEq/L (ref 3.5–5.3)

## 2010-10-15 LAB — CBC WITH DIFFERENTIAL/PLATELET
BASO%: 1.2 % (ref 0.0–2.0)
EOS%: 1.1 % (ref 0.0–7.0)
MCH: 29.9 pg (ref 27.2–33.4)
MCHC: 34.5 g/dL (ref 32.0–36.0)
MONO%: 4.8 % (ref 0.0–14.0)
RDW: 15.4 % — ABNORMAL HIGH (ref 11.0–14.6)
lymph#: 1.8 10*3/uL (ref 0.9–3.3)

## 2010-10-20 ENCOUNTER — Encounter (HOSPITAL_BASED_OUTPATIENT_CLINIC_OR_DEPARTMENT_OTHER): Payer: Medicaid Other | Admitting: Oncology

## 2010-10-20 DIAGNOSIS — B977 Papillomavirus as the cause of diseases classified elsewhere: Secondary | ICD-10-CM

## 2010-10-20 DIAGNOSIS — C109 Malignant neoplasm of oropharynx, unspecified: Secondary | ICD-10-CM

## 2010-10-20 DIAGNOSIS — Z5111 Encounter for antineoplastic chemotherapy: Secondary | ICD-10-CM

## 2010-10-27 ENCOUNTER — Other Ambulatory Visit: Payer: Self-pay | Admitting: Oncology

## 2010-10-27 ENCOUNTER — Encounter (HOSPITAL_BASED_OUTPATIENT_CLINIC_OR_DEPARTMENT_OTHER): Payer: Medicaid Other | Admitting: Oncology

## 2010-10-27 DIAGNOSIS — R112 Nausea with vomiting, unspecified: Secondary | ICD-10-CM

## 2010-10-27 DIAGNOSIS — B977 Papillomavirus as the cause of diseases classified elsewhere: Secondary | ICD-10-CM

## 2010-10-27 DIAGNOSIS — C109 Malignant neoplasm of oropharynx, unspecified: Secondary | ICD-10-CM

## 2010-10-27 DIAGNOSIS — R197 Diarrhea, unspecified: Secondary | ICD-10-CM

## 2010-10-27 LAB — BASIC METABOLIC PANEL
BUN: 26 mg/dL — ABNORMAL HIGH (ref 6–23)
Chloride: 99 mEq/L (ref 96–112)
Glucose, Bld: 80 mg/dL (ref 70–99)
Potassium: 4.3 mEq/L (ref 3.5–5.3)
Sodium: 137 mEq/L (ref 135–145)

## 2010-10-28 ENCOUNTER — Encounter (HOSPITAL_BASED_OUTPATIENT_CLINIC_OR_DEPARTMENT_OTHER): Payer: Medicaid Other | Admitting: Oncology

## 2010-10-28 DIAGNOSIS — B977 Papillomavirus as the cause of diseases classified elsewhere: Secondary | ICD-10-CM

## 2010-10-28 DIAGNOSIS — E860 Dehydration: Secondary | ICD-10-CM

## 2010-10-28 DIAGNOSIS — C770 Secondary and unspecified malignant neoplasm of lymph nodes of head, face and neck: Secondary | ICD-10-CM

## 2010-10-28 DIAGNOSIS — R112 Nausea with vomiting, unspecified: Secondary | ICD-10-CM

## 2010-10-28 DIAGNOSIS — C109 Malignant neoplasm of oropharynx, unspecified: Secondary | ICD-10-CM

## 2010-10-28 DIAGNOSIS — R197 Diarrhea, unspecified: Secondary | ICD-10-CM

## 2010-10-30 ENCOUNTER — Other Ambulatory Visit: Payer: Self-pay | Admitting: Oncology

## 2010-10-30 ENCOUNTER — Encounter (HOSPITAL_BASED_OUTPATIENT_CLINIC_OR_DEPARTMENT_OTHER): Payer: Medicaid Other | Admitting: Oncology

## 2010-10-30 DIAGNOSIS — E860 Dehydration: Secondary | ICD-10-CM

## 2010-10-30 DIAGNOSIS — R197 Diarrhea, unspecified: Secondary | ICD-10-CM

## 2010-10-30 DIAGNOSIS — C109 Malignant neoplasm of oropharynx, unspecified: Secondary | ICD-10-CM

## 2010-10-30 LAB — BASIC METABOLIC PANEL
CO2: 24 mEq/L (ref 19–32)
Calcium: 9.3 mg/dL (ref 8.4–10.5)
Chloride: 103 mEq/L (ref 96–112)
Glucose, Bld: 108 mg/dL — ABNORMAL HIGH (ref 70–99)
Sodium: 139 mEq/L (ref 135–145)

## 2010-10-31 ENCOUNTER — Encounter (HOSPITAL_BASED_OUTPATIENT_CLINIC_OR_DEPARTMENT_OTHER): Payer: Medicaid Other | Admitting: Oncology

## 2010-10-31 DIAGNOSIS — R112 Nausea with vomiting, unspecified: Secondary | ICD-10-CM

## 2010-11-03 DIAGNOSIS — C109 Malignant neoplasm of oropharynx, unspecified: Secondary | ICD-10-CM

## 2010-11-03 DIAGNOSIS — K1370 Unspecified lesions of oral mucosa: Secondary | ICD-10-CM

## 2010-11-04 ENCOUNTER — Encounter (HOSPITAL_BASED_OUTPATIENT_CLINIC_OR_DEPARTMENT_OTHER): Payer: Medicaid Other | Admitting: Oncology

## 2010-11-04 DIAGNOSIS — R197 Diarrhea, unspecified: Secondary | ICD-10-CM

## 2010-11-04 DIAGNOSIS — C109 Malignant neoplasm of oropharynx, unspecified: Secondary | ICD-10-CM

## 2010-11-04 DIAGNOSIS — E860 Dehydration: Secondary | ICD-10-CM

## 2010-11-04 DIAGNOSIS — R112 Nausea with vomiting, unspecified: Secondary | ICD-10-CM

## 2010-11-06 ENCOUNTER — Encounter (HOSPITAL_BASED_OUTPATIENT_CLINIC_OR_DEPARTMENT_OTHER): Payer: BC Managed Care – PPO | Admitting: Oncology

## 2010-11-06 ENCOUNTER — Other Ambulatory Visit: Payer: Self-pay | Admitting: Oncology

## 2010-11-06 DIAGNOSIS — C770 Secondary and unspecified malignant neoplasm of lymph nodes of head, face and neck: Secondary | ICD-10-CM

## 2010-11-06 LAB — CBC WITH DIFFERENTIAL/PLATELET
Basophils Absolute: 0 10*3/uL (ref 0.0–0.1)
EOS%: 1.3 % (ref 0.0–7.0)
Eosinophils Absolute: 0.1 10*3/uL (ref 0.0–0.5)
HGB: 13.2 g/dL (ref 13.0–17.1)
LYMPH%: 10.9 % — ABNORMAL LOW (ref 14.0–49.0)
MCH: 30 pg (ref 27.2–33.4)
MCV: 87.4 fL (ref 79.3–98.0)
MONO%: 3.6 % (ref 0.0–14.0)
NEUT#: 4.6 10*3/uL (ref 1.5–6.5)
NEUT%: 83.6 % — ABNORMAL HIGH (ref 39.0–75.0)
Platelets: 144 10*3/uL (ref 140–400)
RDW: 15.2 % — ABNORMAL HIGH (ref 11.0–14.6)

## 2010-11-06 LAB — COMPREHENSIVE METABOLIC PANEL
AST: 12 U/L (ref 0–37)
Albumin: 3.9 g/dL (ref 3.5–5.2)
Alkaline Phosphatase: 90 U/L (ref 39–117)
BUN: 19 mg/dL (ref 6–23)
Creatinine, Ser: 1.27 mg/dL (ref 0.50–1.35)
Glucose, Bld: 175 mg/dL — ABNORMAL HIGH (ref 70–99)
Potassium: 3.8 mEq/L (ref 3.5–5.3)
Total Bilirubin: 0.7 mg/dL (ref 0.3–1.2)

## 2010-11-09 ENCOUNTER — Encounter (HOSPITAL_BASED_OUTPATIENT_CLINIC_OR_DEPARTMENT_OTHER): Payer: Medicaid Other | Admitting: Oncology

## 2010-11-09 DIAGNOSIS — C109 Malignant neoplasm of oropharynx, unspecified: Secondary | ICD-10-CM

## 2010-11-09 DIAGNOSIS — B977 Papillomavirus as the cause of diseases classified elsewhere: Secondary | ICD-10-CM

## 2010-11-09 DIAGNOSIS — Z5111 Encounter for antineoplastic chemotherapy: Secondary | ICD-10-CM

## 2010-11-10 ENCOUNTER — Encounter (HOSPITAL_BASED_OUTPATIENT_CLINIC_OR_DEPARTMENT_OTHER): Payer: Medicaid Other | Admitting: Oncology

## 2010-11-10 DIAGNOSIS — C770 Secondary and unspecified malignant neoplasm of lymph nodes of head, face and neck: Secondary | ICD-10-CM

## 2010-11-10 DIAGNOSIS — E860 Dehydration: Secondary | ICD-10-CM

## 2010-11-11 ENCOUNTER — Encounter (HOSPITAL_BASED_OUTPATIENT_CLINIC_OR_DEPARTMENT_OTHER): Payer: Medicaid Other | Admitting: Oncology

## 2010-11-11 DIAGNOSIS — E860 Dehydration: Secondary | ICD-10-CM

## 2010-11-13 ENCOUNTER — Encounter (HOSPITAL_BASED_OUTPATIENT_CLINIC_OR_DEPARTMENT_OTHER): Payer: Medicaid - Dental | Admitting: Oncology

## 2010-11-13 ENCOUNTER — Other Ambulatory Visit: Payer: Self-pay | Admitting: Oncology

## 2010-11-13 DIAGNOSIS — C770 Secondary and unspecified malignant neoplasm of lymph nodes of head, face and neck: Secondary | ICD-10-CM

## 2010-11-13 LAB — BASIC METABOLIC PANEL
BUN: 39 mg/dL — ABNORMAL HIGH (ref 6–23)
Chloride: 101 mEq/L (ref 96–112)
Glucose, Bld: 100 mg/dL — ABNORMAL HIGH (ref 70–99)
Potassium: 4 mEq/L (ref 3.5–5.3)
Sodium: 140 mEq/L (ref 135–145)

## 2010-11-13 NOTE — Discharge Summary (Signed)
  [name] [name], [name] [name] NO.:  [id number]  MEDICAL RECORD NO.:  [id number]  LOCATION:  5159                         FACILITY:  [facility]  PHYSICIAN:  Antony Contras, MD     DATE OF BIRTH:  Jul 10, 1955  DATE OF ADMISSION:  08/10/2010 DATE OF DISCHARGE:  08/14/2010                              DISCHARGE SUMMARY   ADMISSION DIAGNOSIS:  Right neck infection, possible abscess.  DISCHARGE DIAGNOSES: 1. Right neck infection, possible abscess. 2. Right neck carcinoma.  PROCEDURES: 1. CT of the neck with contrast. 2. Incision and drainage and biopsy of right neck mass.  HISTORY OF PRESENT ILLNESS:  The patient is a 55 year old white male who awoke with swelling in the right lower neck about a week ago that was tender and associated with right ear pain.  It was worse last week and it is now.  He had fever and chills but did not seek medical attention. He came to the emergency department on the evening of admission because his problems were persistent.  CT imaging was performed in the emergency department and demonstrated a 4 x 2 cm area of heterogeneous appearance with central fluid collection deep to the right sternocleidomastoid muscle.  He was also found to have an elevated white blood count.  Thus, he was admitted to hospital.  HOSPITAL COURSE:  The patient was admitted to the hospital and given intravenous antibiotic therapy starting on Unasyn.  He was kept on antibiotics for a couple of days in order to try to reduce inflammation of the site.  The area of edema and tenderness remained prominent in spite of antibiotic therapy and so on August 13, 2010, he was taken to the operating room for surgical management.  For details of that procedure, please see the dictated operative note.  He did well after surgery with a Penrose drain in place and a fluff dressing around the neck.  On the day after surgery, the pathologist called with the diagnosis of a biopsy material  showing squamous cell carcinoma.  Thus, the Penrose drain was removed and he was discharged home with a fluff dressing in place.  DISCHARGE INSTRUCTIONS:  The patient was asked to keep the wound dry until it closes.  He was asked to apply bacitracin ointment twice daily. He was asked to increase activity slowly and had no diet restrictions. An outpatient PET scan will be set up for him.  DISCHARGE MEDICATIONS:  Vicodin.  FOLLOWUP:  With Dr. Jenne Pane in 1 week.     Antony Contras, MD     DDB/MEDQ  D:  09/23/2010  T:  09/24/2010  Job:  [id number]  Electronically Signed by Christia Reading MD on 11/13/2010 12:32:06 PM

## 2010-11-24 ENCOUNTER — Encounter (HOSPITAL_BASED_OUTPATIENT_CLINIC_OR_DEPARTMENT_OTHER): Payer: BC Managed Care – PPO | Admitting: Oncology

## 2010-11-24 DIAGNOSIS — C109 Malignant neoplasm of oropharynx, unspecified: Secondary | ICD-10-CM

## 2010-11-25 ENCOUNTER — Encounter (HOSPITAL_BASED_OUTPATIENT_CLINIC_OR_DEPARTMENT_OTHER): Payer: Medicaid Other | Admitting: Oncology

## 2010-11-25 DIAGNOSIS — C109 Malignant neoplasm of oropharynx, unspecified: Secondary | ICD-10-CM

## 2010-11-27 ENCOUNTER — Other Ambulatory Visit: Payer: Self-pay | Admitting: Oncology

## 2010-11-27 ENCOUNTER — Encounter (HOSPITAL_BASED_OUTPATIENT_CLINIC_OR_DEPARTMENT_OTHER): Payer: Self-pay | Admitting: Oncology

## 2010-11-27 DIAGNOSIS — C770 Secondary and unspecified malignant neoplasm of lymph nodes of head, face and neck: Secondary | ICD-10-CM

## 2010-11-27 LAB — CBC WITH DIFFERENTIAL/PLATELET
BASO%: 0.5 % (ref 0.0–2.0)
Eosinophils Absolute: 0.1 10*3/uL (ref 0.0–0.5)
HCT: 33.9 % — ABNORMAL LOW (ref 38.4–49.9)
LYMPH%: 25.4 % (ref 14.0–49.0)
MCHC: 35.1 g/dL (ref 32.0–36.0)
MCV: 86 fL (ref 79.3–98.0)
MONO#: 0.5 10*3/uL (ref 0.1–0.9)
MONO%: 17.3 % — ABNORMAL HIGH (ref 0.0–14.0)
NEUT%: 54.6 % (ref 39.0–75.0)
Platelets: 293 10*3/uL (ref 140–400)
RBC: 3.95 10*6/uL — ABNORMAL LOW (ref 4.20–5.82)
WBC: 2.7 10*3/uL — ABNORMAL LOW (ref 4.0–10.3)

## 2010-11-27 LAB — COMPREHENSIVE METABOLIC PANEL
BUN: 22 mg/dL (ref 6–23)
CO2: 22 mEq/L (ref 19–32)
Calcium: 9.6 mg/dL (ref 8.4–10.5)
Chloride: 101 mEq/L (ref 96–112)
Creatinine, Ser: 1.75 mg/dL — ABNORMAL HIGH (ref 0.50–1.35)
Total Bilirubin: 0.3 mg/dL (ref 0.3–1.2)

## 2010-11-27 LAB — TECHNOLOGIST REVIEW

## 2010-11-30 ENCOUNTER — Encounter (HOSPITAL_BASED_OUTPATIENT_CLINIC_OR_DEPARTMENT_OTHER): Payer: Self-pay | Admitting: Oncology

## 2010-11-30 DIAGNOSIS — N289 Disorder of kidney and ureter, unspecified: Secondary | ICD-10-CM

## 2010-11-30 DIAGNOSIS — E860 Dehydration: Secondary | ICD-10-CM

## 2010-11-30 DIAGNOSIS — B977 Papillomavirus as the cause of diseases classified elsewhere: Secondary | ICD-10-CM

## 2010-11-30 DIAGNOSIS — C109 Malignant neoplasm of oropharynx, unspecified: Secondary | ICD-10-CM

## 2010-12-01 ENCOUNTER — Encounter (HOSPITAL_BASED_OUTPATIENT_CLINIC_OR_DEPARTMENT_OTHER): Payer: Medicaid Other | Admitting: Oncology

## 2010-12-01 DIAGNOSIS — E860 Dehydration: Secondary | ICD-10-CM

## 2010-12-01 DIAGNOSIS — B977 Papillomavirus as the cause of diseases classified elsewhere: Secondary | ICD-10-CM

## 2010-12-01 DIAGNOSIS — C109 Malignant neoplasm of oropharynx, unspecified: Secondary | ICD-10-CM

## 2010-12-02 ENCOUNTER — Encounter (HOSPITAL_BASED_OUTPATIENT_CLINIC_OR_DEPARTMENT_OTHER): Payer: Self-pay | Admitting: Oncology

## 2010-12-02 DIAGNOSIS — N289 Disorder of kidney and ureter, unspecified: Secondary | ICD-10-CM

## 2010-12-02 DIAGNOSIS — E860 Dehydration: Secondary | ICD-10-CM

## 2010-12-07 ENCOUNTER — Encounter (HOSPITAL_BASED_OUTPATIENT_CLINIC_OR_DEPARTMENT_OTHER): Payer: Medicaid Other | Admitting: Oncology

## 2010-12-07 ENCOUNTER — Other Ambulatory Visit: Payer: Self-pay | Admitting: Oncology

## 2010-12-07 DIAGNOSIS — H9190 Unspecified hearing loss, unspecified ear: Secondary | ICD-10-CM

## 2010-12-07 DIAGNOSIS — E860 Dehydration: Secondary | ICD-10-CM

## 2010-12-07 LAB — BASIC METABOLIC PANEL
CO2: 21 mEq/L (ref 19–32)
Chloride: 102 mEq/L (ref 96–112)
Glucose, Bld: 94 mg/dL (ref 70–99)
Sodium: 140 mEq/L (ref 135–145)

## 2010-12-08 ENCOUNTER — Encounter (HOSPITAL_BASED_OUTPATIENT_CLINIC_OR_DEPARTMENT_OTHER): Payer: Medicaid Other | Admitting: Oncology

## 2010-12-08 DIAGNOSIS — H9190 Unspecified hearing loss, unspecified ear: Secondary | ICD-10-CM

## 2010-12-08 DIAGNOSIS — E860 Dehydration: Secondary | ICD-10-CM

## 2010-12-09 ENCOUNTER — Encounter (HOSPITAL_BASED_OUTPATIENT_CLINIC_OR_DEPARTMENT_OTHER): Payer: Self-pay | Admitting: Oncology

## 2010-12-09 DIAGNOSIS — N289 Disorder of kidney and ureter, unspecified: Secondary | ICD-10-CM

## 2010-12-09 DIAGNOSIS — E860 Dehydration: Secondary | ICD-10-CM

## 2010-12-09 DIAGNOSIS — C109 Malignant neoplasm of oropharynx, unspecified: Secondary | ICD-10-CM

## 2010-12-10 ENCOUNTER — Encounter: Payer: Self-pay | Admitting: Oncology

## 2010-12-10 DIAGNOSIS — N289 Disorder of kidney and ureter, unspecified: Secondary | ICD-10-CM | POA: Insufficient documentation

## 2010-12-10 DIAGNOSIS — C109 Malignant neoplasm of oropharynx, unspecified: Secondary | ICD-10-CM | POA: Insufficient documentation

## 2010-12-15 ENCOUNTER — Ambulatory Visit
Admission: RE | Admit: 2010-12-15 | Discharge: 2010-12-15 | Disposition: A | Discharge status: Home / Self Care | Payer: Medicaid Other | Source: Ambulatory Visit | Attending: Radiation Oncology | Admitting: Radiation Oncology

## 2010-12-15 DIAGNOSIS — Z510 Encounter for antineoplastic radiation therapy: Secondary | ICD-10-CM | POA: Insufficient documentation

## 2010-12-15 DIAGNOSIS — C50919 Malignant neoplasm of unspecified site of unspecified female breast: Secondary | ICD-10-CM | POA: Insufficient documentation

## 2010-12-15 DIAGNOSIS — R1310 Dysphagia, unspecified: Secondary | ICD-10-CM | POA: Insufficient documentation

## 2010-12-15 DIAGNOSIS — R634 Abnormal weight loss: Secondary | ICD-10-CM | POA: Insufficient documentation

## 2010-12-15 DIAGNOSIS — Y842 Radiological procedure and radiotherapy as the cause of abnormal reaction of the patient, or of later complication, without mention of misadventure at the time of the procedure: Secondary | ICD-10-CM | POA: Insufficient documentation

## 2010-12-15 DIAGNOSIS — T66XXXA Radiation sickness, unspecified, initial encounter: Secondary | ICD-10-CM | POA: Insufficient documentation

## 2010-12-28 ENCOUNTER — Ambulatory Visit (HOSPITAL_BASED_OUTPATIENT_CLINIC_OR_DEPARTMENT_OTHER): Payer: Medicaid Other | Admitting: Oncology

## 2010-12-28 ENCOUNTER — Other Ambulatory Visit: Payer: Self-pay | Admitting: Oncology

## 2010-12-28 ENCOUNTER — Telehealth: Payer: Self-pay | Admitting: Oncology

## 2010-12-28 DIAGNOSIS — E860 Dehydration: Secondary | ICD-10-CM

## 2010-12-28 DIAGNOSIS — C109 Malignant neoplasm of oropharynx, unspecified: Secondary | ICD-10-CM

## 2010-12-28 DIAGNOSIS — Z5111 Encounter for antineoplastic chemotherapy: Secondary | ICD-10-CM

## 2010-12-28 DIAGNOSIS — R197 Diarrhea, unspecified: Secondary | ICD-10-CM

## 2010-12-28 DIAGNOSIS — R112 Nausea with vomiting, unspecified: Secondary | ICD-10-CM

## 2010-12-28 LAB — CBC WITH DIFFERENTIAL/PLATELET
Basophils Absolute: 0 10*3/uL (ref 0.0–0.1)
EOS%: 2.3 % (ref 0.0–7.0)
HCT: 34.8 % — ABNORMAL LOW (ref 38.4–49.9)
HGB: 12.1 g/dL — ABNORMAL LOW (ref 13.0–17.1)
LYMPH%: 6 % — ABNORMAL LOW (ref 14.0–49.0)
MCH: 31 pg (ref 27.2–33.4)
MCHC: 34.8 g/dL (ref 32.0–36.0)
MCV: 89.1 fL (ref 79.3–98.0)
MONO%: 7.8 % (ref 0.0–14.0)
NEUT%: 83.7 % — ABNORMAL HIGH (ref 39.0–75.0)
Platelets: 161 10*3/uL (ref 140–400)

## 2010-12-28 LAB — COMPREHENSIVE METABOLIC PANEL
AST: 15 U/L (ref 0–37)
Alkaline Phosphatase: 98 U/L (ref 39–117)
BUN: 39 mg/dL — ABNORMAL HIGH (ref 6–23)
Calcium: 9.9 mg/dL (ref 8.4–10.5)
Chloride: 101 mEq/L (ref 96–112)
Creatinine, Ser: 1.67 mg/dL — ABNORMAL HIGH (ref 0.50–1.35)
Total Bilirubin: 0.3 mg/dL (ref 0.3–1.2)

## 2010-12-28 NOTE — Telephone Encounter (Signed)
gv pt appt schedule for nov/jan including pet scan @ [facility] for 1/17 @ 8:30 am.

## 2011-01-04 ENCOUNTER — Emergency Department (HOSPITAL_COMMUNITY)
Admission: EM | Admit: 2011-01-04 | Discharge: 2011-01-05 | Disposition: A | Discharge status: Home / Self Care | Payer: Medicaid Other | Attending: Emergency Medicine | Admitting: Emergency Medicine

## 2011-01-04 ENCOUNTER — Encounter (HOSPITAL_COMMUNITY): Payer: Self-pay | Admitting: Emergency Medicine

## 2011-01-04 DIAGNOSIS — C109 Malignant neoplasm of oropharynx, unspecified: Secondary | ICD-10-CM | POA: Insufficient documentation

## 2011-01-04 DIAGNOSIS — F3289 Other specified depressive episodes: Secondary | ICD-10-CM | POA: Insufficient documentation

## 2011-01-04 DIAGNOSIS — N289 Disorder of kidney and ureter, unspecified: Secondary | ICD-10-CM | POA: Insufficient documentation

## 2011-01-04 DIAGNOSIS — Z931 Gastrostomy status: Secondary | ICD-10-CM | POA: Insufficient documentation

## 2011-01-04 DIAGNOSIS — F329 Major depressive disorder, single episode, unspecified: Secondary | ICD-10-CM | POA: Insufficient documentation

## 2011-01-04 DIAGNOSIS — F419 Anxiety disorder, unspecified: Secondary | ICD-10-CM

## 2011-01-04 DIAGNOSIS — F17200 Nicotine dependence, unspecified, uncomplicated: Secondary | ICD-10-CM | POA: Insufficient documentation

## 2011-01-04 DIAGNOSIS — R5383 Other fatigue: Secondary | ICD-10-CM | POA: Insufficient documentation

## 2011-01-04 DIAGNOSIS — R5381 Other malaise: Secondary | ICD-10-CM | POA: Insufficient documentation

## 2011-01-04 NOTE — ED Notes (Signed)
Pt arrives via EMS, c/o weakness, pt hx of cancer, "neck", onset ago, resp even unlabored, skin pwd, VSS

## 2011-01-04 NOTE — ED Notes (Signed)
ZOX:[id number]<AV> Expected date:01/04/11<BR> Expected time:11:10 PM<BR> Means of arrival:Ambulance<BR> Comments:<BR> EMS 211 GC, 55 yom [place] pt, general illness

## 2011-01-05 ENCOUNTER — Telehealth: Payer: Self-pay | Admitting: *Deleted

## 2011-01-05 DIAGNOSIS — C770 Secondary and unspecified malignant neoplasm of lymph nodes of head, face and neck: Secondary | ICD-10-CM

## 2011-01-05 LAB — POCT I-STAT, CHEM 8
Calcium, Ion: 1.23 mmol/L (ref 1.12–1.32)
Creatinine, Ser: 1.9 mg/dL — ABNORMAL HIGH (ref 0.50–1.35)
Hemoglobin: 11.6 g/dL — ABNORMAL LOW (ref 13.0–17.0)
Sodium: 138 mEq/L (ref 135–145)
TCO2: 24 mmol/L (ref 0–100)

## 2011-01-05 MED ORDER — MAGIC MOUTHWASH: 15.0000 mL | Freq: Four times a day (QID) | ORAL | Status: DC

## 2011-01-05 MED ORDER — LORAZEPAM 1 MG PO TABS
1.0000 mg | ORAL_TABLET | Freq: Once | ORAL | Status: AC
  Administered 2011-01-05: 1 mg via ORAL

## 2011-01-05 MED ORDER — LORAZEPAM 1 MG PO TABS: 1.0000 mg | ORAL_TABLET | Freq: Four times a day (QID) | ORAL | Status: DC | PRN

## 2011-01-05 MED ORDER — LORAZEPAM 1 MG PO TABS
ORAL_TABLET | ORAL | Status: AC
  Filled 2011-01-05: qty 1

## 2011-01-05 NOTE — ED Notes (Signed)
Pt presents to desk, states he feels fine, doesn't want to complete stay in ER, informed if he leaves before disposition that he would be leaving against medical advice, pt returns to room, MD made aware, will see pt. Charge Rn aware

## 2011-01-05 NOTE — ED Notes (Signed)
IV Placed. 20 ga PIV placed to RAC.  Full rainbow sent to lab. Site prepped. Pt tolerated well. IV secured.

## 2011-01-05 NOTE — ED Notes (Signed)
Pt given discharge instructions and verbalizes understanding  

## 2011-01-05 NOTE — ED Provider Notes (Signed)
History     CSN: [id number] Arrival date & time: 01/04/2011 11:35 PM   First MD Initiated Contact with Patient 01/04/11 2351      Chief Complaint  Patient presents with  . Fatigue    (Consider location/radiation/quality/duration/timing/severity/associated sxs/prior treatment) The history is provided by the patient and a friend.   patient presents with anxiety and weakness which started suddenly. Patient notes he has been more depressed recently due to his diagnosis of esophageal cancer and recent PEG tube placement. Does admit to anorexia, insomnia. Patient denies any shortness of breath, cough, chest pain. Patient called EMS and on arrival he was hyperventilating and noted he had tingling in his hands and feet. Patient feels better now that he has caught himself and was back to baseline. He denies any suicidal or homicidal ideations at this time, denies any hallucinations  Past Medical History  Diagnosis Date  . Oropharyngeal cancer 2012  . Renal insufficiency 2012    Past Surgical History  Procedure Date  . Appendectomy     No family history on file.  History  Substance Use Topics  . Smoking status: Passive Smoker -- 1.0 packs/day for 25 years    Types: Cigarettes  . Smokeless tobacco: Not on file  . Alcohol Use: No      Review of Systems  All other systems reviewed and are negative.    Allergies  Review of patient's allergies indicates no known allergies.  Home Medications   Current Outpatient Rx  Name Route Sig Dispense Refill  . ACETAMINOPHEN-CODEINE 120-12 MG/5ML PO SOLN Oral Take 5 mLs by mouth every 6 (six) hours as needed.        BP 116/76  Pulse 89  Temp(Src) 97.7 F (36.5 C) (Oral)  Resp 18  SpO2 99%  Physical Exam  Nursing note and vitals reviewed. Constitutional: He is oriented to person, place, and time. Vital signs are normal. He appears well-developed and well-nourished.  Non-toxic appearance. No distress.  HENT:  Head: Normocephalic  and atraumatic.  Eyes: Conjunctivae and EOM are normal. Pupils are equal, round, and reactive to light.  Neck: Normal range of motion. Neck supple. No tracheal deviation present.  Cardiovascular: Normal rate, regular rhythm and normal heart sounds.  Exam reveals no gallop.   No murmur heard. Pulmonary/Chest: Effort normal and breath sounds normal. No stridor. No respiratory distress. He has no wheezes.  Abdominal: Soft. Normal appearance and bowel sounds are normal. He exhibits no distension. There is no tenderness. There is no rebound.  Musculoskeletal: Normal range of motion. He exhibits no edema and no tenderness.  Neurological: He is alert and oriented to person, place, and time. He has normal strength. No cranial nerve deficit or sensory deficit. GCS eye subscore is 4. GCS verbal subscore is 5. GCS motor subscore is 6.  Skin: Skin is warm and dry.  Psychiatric: His speech is normal and behavior is normal. His affect is blunt. He exhibits a depressed mood.       Depressed mood, blunted affect     ED Course  Procedures (including critical care time)   Labs Reviewed  I-STAT, CHEM 8   No results found.   No diagnosis found.    MDM  Patient informed of his worsening renal insufficiency. Given Ativan feels better will discharge to home        Toy Baker, MD 01/05/11 [phone]

## 2011-01-05 NOTE — Telephone Encounter (Signed)
REFILL COMPLETED. 

## 2011-01-05 NOTE — ED Notes (Signed)
Pt states feels a lot better. Pt cont to await further dispo. Family at bedside. Will cont to monitor

## 2011-01-06 ENCOUNTER — Other Ambulatory Visit: Payer: Self-pay | Admitting: Oncology

## 2011-01-07 ENCOUNTER — Other Ambulatory Visit: Payer: Self-pay | Admitting: Oncology

## 2011-01-08 ENCOUNTER — Encounter: Payer: Self-pay | Admitting: *Deleted

## 2011-01-14 ENCOUNTER — Telehealth: Payer: Self-pay | Admitting: *Deleted

## 2011-01-14 NOTE — Telephone Encounter (Signed)
Andrez Grime Called to report that Mr Beeney has been having panic since he completed Radiation therapy and had a visit to [facility] ED on 01/05/11.  Ms Tawanna Solo states that she was informed by the ED Physician that he was having a "panic attack" and he was sent home with a script for Lorazepam 1 mg tabs (12) ordered as 1 po Q 6 hours prn.  Currently patient refuses to leave his home for his scheduled PC visit for an earache and refuses to see the [facility] dietician.  He is refusing to get out of bed.  Ms Tawanna Solo is asking for advice on how to motivate pt to come for his visits.  Ms Tawanna Solo is also concerned that Mr Steig is taking too much of his Hydrocodone Elixir since she is not at home to monitor all day.  Addiitonally, patient  Is refusing his enteral nutrition because of nausea.  It was suggested by Zenovia Jarred, Dietician that to decrease the rate via the enteral pump but Mr Lambert refuses.    Ms. Tawanna Solo is requesting a refill on the Ativan because as she states she feels his behavior is part of his "Panic attack" behavior.    Informed Ms Elder that I would contact  Zenovia Jarred, Dietician and  Roselee Nova, SW  social worker in regards to Mr Villagran's nutritional status, and possible psychosocial needs.  Left message for Zenovia Jarred, Dietician and Roselee Nova, SW at [phone].

## 2011-01-14 NOTE — Telephone Encounter (Signed)
After consulting with Dr. Champ Mungo called Ms Elder to ascertain if patient would be willing to come to see Dr. Basilio Cairo on tomorrow for an assessment of his earache.  Pt agreed and called transfer to [facility] to schedule.  Informed Ms Elder that per Dr. Michell Heinrich Mr Elder's medications would be reviewed on tomorrow by Dr. Basilio Cairo before reordering.

## 2011-01-15 ENCOUNTER — Encounter: Payer: Self-pay | Admitting: Radiation Oncology

## 2011-01-15 ENCOUNTER — Ambulatory Visit
Admission: RE | Admit: 2011-01-15 | Discharge: 2011-01-15 | Disposition: A | Discharge status: Home / Self Care | Payer: Medicaid Other | Source: Ambulatory Visit | Attending: Radiation Oncology | Admitting: Radiation Oncology

## 2011-01-15 DIAGNOSIS — Z0000 Encounter for general adult medical examination without abnormal findings: Secondary | ICD-10-CM

## 2011-01-15 DIAGNOSIS — C770 Secondary and unspecified malignant neoplasm of lymph nodes of head, face and neck: Secondary | ICD-10-CM

## 2011-01-15 DIAGNOSIS — F419 Anxiety disorder, unspecified: Secondary | ICD-10-CM

## 2011-01-15 DIAGNOSIS — C109 Malignant neoplasm of oropharynx, unspecified: Secondary | ICD-10-CM

## 2011-01-15 DIAGNOSIS — N289 Disorder of kidney and ureter, unspecified: Secondary | ICD-10-CM

## 2011-01-15 MED ORDER — HYDROCODONE-ACETAMINOPHEN 7.5-500 MG/15ML PO SOLN: 15.0000 mL | Freq: Four times a day (QID) | ORAL | Status: DC | PRN

## 2011-01-15 MED ORDER — LORAZEPAM 1 MG PO TABS: 1.0000 mg | ORAL_TABLET | Freq: Four times a day (QID) | ORAL | Status: AC | PRN

## 2011-01-15 NOTE — Progress Notes (Signed)
Was transported to ed , fingers started tingling, couldn't get breath, went numb.  Couldn't talk.  Happened on 11- 6- 12.  Was diagnosed with panic attack.  Dr. Sable Feil, he says it is helping

## 2011-01-15 NOTE — Progress Notes (Signed)
Encounter addended by: Delynn Flavin, RN on: 01/15/2011  4:41 PM<BR>     Documentation filed: Charges VN

## 2011-01-15 NOTE — Progress Notes (Signed)
[facility] Radiation Oncology Follow up Note  Name: [name] [name] MRN: [id number]  Date: 01/15/2011  DOB: [birth year]  CC: Jethro Bolus, MD; Christia Reading, MD; Eather Colas, MD.  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   MEDICATIONS: reviewed  NARRATIVE: He is recovering from treatment. Reports decreased hearing, especially per right ear.  Was diagnosed with a panic attach on 11/6 after going to ED for tingling/numbness in arms, hyperventilation, anxiety. Was given Ativan Rx for this.  He informs me he has no PCP.  His mouth is sore with PO intake, continues mouthwash and Lortab for this.  He is taking some nutrition by mouth, some by feed tube.     PHYSICAL EXAM:  weight is 152 lb (68.947 kg). His temperature is 97.6 F (36.4 C). His blood pressure is 124/77 and his pulse is 89. His respiration is 18.  He is in no acute distress. He is thin.  Scalp positive for alopecia at neck. Tympanic membranes occluded by cerumen. His oropharynx is without teeth, thrush, mucositis, or tumor. Mucous membranes are dry.  Neck is without palpable adenopathy. Heart is regular in rate and rhythm. Chest is clear to auscultation bilaterally.   LABORATORY DATA: Cr was 1.9 on 11/6 in ED.   RADIOGRAPHIC STUDIES:  N/A   IMPRESSION: He is healing from treatment; has continued renal insufficiency, mouth soreness and anxiety. Also, hearing loss.     PLAN: Recommended bland foods for mouth soreness with continued Lortab and mouthwash use.  Encouraged weaning off of feeding tube. Renal issues may be related to past chemotherapy and he will discuss w/ Dr. [name].  Hearing loss could be from cerumen and  / or effects of chemotherapy.  I suggested asking his PCP (referral being made for one) about removing cerumen.  Continue Ativan for anxiety.  I will see him back in  later January. In the meantime, we will try to get him a PCP. He will see Dr. [name] with a PET scan in the next several weeks.  I refilled his Ativan and his Lortab Elixir today.      I spent 30 minutes face to face with the patient and more than 50% of that time was spent in counseling and/or coordination of care.

## 2011-01-15 NOTE — Progress Notes (Signed)
A user error has taken place: encounter opened in error, closed for administrative reasons.

## 2011-01-18 ENCOUNTER — Encounter: Payer: Self-pay | Admitting: *Deleted

## 2011-01-18 NOTE — Progress Notes (Signed)
CSW received referral to consult for anxiety, depression, and other psychosocial needs.  CSW met with pt in the exam room on 11/16 to assess for needs.  CSW and pt discussed his recent panic attacks and his experience when he went to the ED as a result of a panic attack.  Pt also expressed some depressed feelings, and stated they began after he was dx.  Pt and CSW discussed how the combination of his health issues and loss of his job could have contributed to his feeling of depression and anxiety.  The pt reported symptoms positive with depression, such as loss of interest, loss of motivation, loneliness, and fatigue.  CSW and pt discussed positive coping techniques and idea that interested the pt.  The pt identified walking as a coping skill.  The pt also reported feeling better since his increase in activity over the past few days.  CSW encouraged pt to maintain his activity level and to contact CSW with any needs or concerns.  CSW gave pt contact information and offered to meet with pt to discuss and process any feeling or concers.  Pt stated he would call with any needs.

## 2011-01-19 ENCOUNTER — Telehealth: Payer: Self-pay | Admitting: Oncology

## 2011-01-19 ENCOUNTER — Other Ambulatory Visit: Payer: Self-pay | Admitting: Oncology

## 2011-01-20 ENCOUNTER — Telehealth: Payer: Self-pay | Admitting: Oncology

## 2011-01-20 ENCOUNTER — Telehealth: Payer: Self-pay | Admitting: *Deleted

## 2011-01-20 NOTE — Telephone Encounter (Signed)
Made a note °

## 2011-01-29 ENCOUNTER — Ambulatory Visit: Payer: Medicaid Other | Admitting: Radiation Oncology

## 2011-01-30 ENCOUNTER — Other Ambulatory Visit: Payer: Self-pay | Admitting: Oncology

## 2011-02-02 ENCOUNTER — Telehealth: Payer: Self-pay | Admitting: Oncology

## 2011-02-02 NOTE — Telephone Encounter (Signed)
lmonvm advising the pt of his r/s appts from jan 18th due to the md being on cme to feb. Pt will still keep the pet scan as scheduled

## 2011-02-03 ENCOUNTER — Telehealth: Payer: Self-pay | Admitting: *Deleted

## 2011-02-03 NOTE — Telephone Encounter (Signed)
Damaris Schooner called today upon patients request that he be scheduled for removal of his feeding tube. When questioned if Mr Krygier was maintaining his weight, Ms Elder responded, "We;ll he is eating".  Informed her that I would notify Dr. Basilio Cairo, but that Mr Tawanna Solo may need to come in for a visit to assess his nutritional statue to ensure that it is safe to remove his PEG tube.  Last seen in October 2012.  Will call Ms Elder after notifying Dr. Basilio Cairo.

## 2011-02-04 ENCOUNTER — Telehealth: Payer: Self-pay | Admitting: *Deleted

## 2011-02-04 NOTE — Telephone Encounter (Signed)
Called patient's number to speak with Ms Elder.  Unable to reach to address desire for PEG removal.  Will continue to call.

## 2011-02-04 NOTE — Telephone Encounter (Signed)
Informed [name] [name] that Dr. Basilio Cairo would like for [name] [name] to have his PET which is scheduled for 03/17/10 so that his treatment response can be evaluated, in addiiton to seeing her on 03/25/10 and seeing Dr. [name] on 04/06/10 before determining whether his PEG tube can be removed.  [name] [name] will also be evaluated for weight maintenance before a decision is made regarding his PEG tube removal. [name] states he is now asking for for [employer] hamburgers and not using PEG at the present time.  As reported previously, he reported to the [facility] dietician that his enteral supplements make him nauseated.  Encouraged [name] [name] to provide foods that are higher in calories and protein such as whole vs. Skim milk and Adding sauces,gravies, cheese etc... to food.    [name] also reports that [name]. [name] is now getting up more often and wanting to get out of the house.   Encouraged [name] [name] to please call for any further questions or concerns.

## 2011-03-10 ENCOUNTER — Other Ambulatory Visit (HOSPITAL_COMMUNITY): Payer: Self-pay | Admitting: Dentistry

## 2011-03-11 ENCOUNTER — Ambulatory Visit (HOSPITAL_COMMUNITY): Payer: Medicaid Other | Admitting: Dentistry

## 2011-03-11 VITALS — BP 120/65 | HR 73 | Temp 97.5°F

## 2011-03-11 DIAGNOSIS — R432 Parageusia: Secondary | ICD-10-CM

## 2011-03-11 DIAGNOSIS — K08109 Complete loss of teeth, unspecified cause, unspecified class: Secondary | ICD-10-CM

## 2011-03-11 DIAGNOSIS — K117 Disturbances of salivary secretion: Secondary | ICD-10-CM

## 2011-03-11 NOTE — Progress Notes (Signed)
Thursday, March 11, 2011  BP: 120/65          P: 73            T: 97.5            Wgt: 150 lbs now, was 200 lbs at start of treatment.  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  Patient underwent extraction of all remaining teeth with alveoloplasty and pre-prosthetic surgery on 10/01/2010. Patient then underwent chemoradiation therapy from 10/20/2010 through 12/24/2010. Patient now presents for a periodic oral examination after radiation therapy.     REVIEW OF CHIEF COMPLAINTS: DRY MOUTH: Yes  HARD TO SWALLOW: No HURT TO SWALLOW: No TASTE CHANGES: Taste is coming back. SORES IN MOUTH: None TRISMUS SYMPTOMS: No problems with trismus exercises are trismus. SYMPTOM RELIEF:  Using salt water and Biotene Rinses.  HOME ORAL HYGIENE REGIMEN:  BRUSHING: N/A FLOSSING: N/A RINSING: As above. FLUORIDE: N/A TRISMUS EXERCISES: Maximim interincisql opening: 65 mm.   DENTAL EXAM: ORAL HYGIENE(PLAQUE): Patient is edentulous. LOCATION OF MUCOSITIS: None noted DESCRIPTION OF SALIVA: Decreased saliva. Moderate to severe xerostomia. ANY EXPOSED BONE: None noted OTHER WATCHED AREAS: None DIAGNOSES: 1.  Xerostomia -moderate to severe 2.  Dysgeusia-resolving 3.  Edentulous  RECOMMENDATIONS: 1. Brush tongue twice daily. 2. Use trismus exercises as directed. 3. Use Biotene Rinse or salt water/baking soda rinses. 4. Multiple sips of water as needed. 5. RTC in two weeks for start of denture fabrication. We'll need to obtain prior approval from [facility] for the upper and lower complete dentures. Dr. Cindra Eves

## 2011-03-17 ENCOUNTER — Telehealth: Payer: Self-pay | Admitting: Oncology

## 2011-03-17 NOTE — Telephone Encounter (Signed)
Left VM on patient's cell phone:  Pet Scan has not been approved by Medicaid.  I have rescheduled this procedure to 03/30/2011 arrival time @ 10:00am.  Left my phone number for patient to return call if he so desires too.

## 2011-03-18 ENCOUNTER — Other Ambulatory Visit (HOSPITAL_COMMUNITY): Payer: Self-pay

## 2011-03-18 ENCOUNTER — Encounter: Payer: Self-pay | Admitting: Oncology

## 2011-03-18 NOTE — Progress Notes (Signed)
03/18/2011 Dear Dr, [name],  On a brighter note, [name] [name] was approved for the PET SCAN.  I will call the patient and inform him of this.  Thanks,  [employer]

## 2011-03-18 NOTE — Progress Notes (Signed)
Thanks

## 2011-03-19 ENCOUNTER — Other Ambulatory Visit: Payer: Self-pay | Admitting: Lab

## 2011-03-19 ENCOUNTER — Ambulatory Visit: Payer: Self-pay | Admitting: Oncology

## 2011-03-23 ENCOUNTER — Encounter: Payer: Self-pay | Admitting: *Deleted

## 2011-03-26 ENCOUNTER — Ambulatory Visit: Payer: Medicaid Other | Admitting: Radiation Oncology

## 2011-03-29 ENCOUNTER — Encounter (HOSPITAL_COMMUNITY): Payer: Self-pay | Admitting: Dentistry

## 2011-03-29 ENCOUNTER — Ambulatory Visit (HOSPITAL_COMMUNITY): Payer: Medicaid - Dental | Admitting: Dentistry

## 2011-03-29 VITALS — BP 122/75 | HR 75 | Temp 97.2°F

## 2011-03-29 DIAGNOSIS — K117 Disturbances of salivary secretion: Secondary | ICD-10-CM

## 2011-03-29 DIAGNOSIS — K08109 Complete loss of teeth, unspecified cause, unspecified class: Secondary | ICD-10-CM

## 2011-03-29 DIAGNOSIS — B370 Candidal stomatitis: Secondary | ICD-10-CM

## 2011-03-29 MED ORDER — FLUCONAZOLE 100 MG PO TABS: ORAL_TABLET | ORAL | Status: AC

## 2011-03-29 NOTE — Progress Notes (Signed)
"----------  Monday, March 29, 2011 ----------" BP: 122/75             P: 75             T:   97.2  [name] [name] presents for start of upper and lower complete denture fabrication. Exam: Patient is edentulous. Oral candidiasis involving right buccal mucosa. Rx: Diflucan 100 mg Take two tablets today, and then one daily for 13 days. Take until all gone. #15. No refills. Discussed with Dr. Basilio Cairo. Use of Diflucan ok per Dr. Basilio Cairo.  Discussed procedures involved in upper and lower complete denture fabrication and prognosis for successful ability to wear dentures. Price for dentures confirmed. Patient agrees to proceed with upper and lower complete denture fabrication. Procedure: Upper and lower complete denture primary impressions in alginate. Lab pour. To [facility] for custom impression tray fabrication. RTC for upper and lower complete denture final impressions. Dr. Cindra Eves

## 2011-03-30 ENCOUNTER — Encounter (HOSPITAL_COMMUNITY)
Admission: RE | Admit: 2011-03-30 | Discharge: 2011-03-30 | Disposition: A | Discharge status: Home / Self Care | Payer: Medicaid Other | Source: Ambulatory Visit | Attending: Oncology | Admitting: Oncology

## 2011-03-30 ENCOUNTER — Encounter (HOSPITAL_COMMUNITY): Payer: Self-pay

## 2011-03-30 DIAGNOSIS — C109 Malignant neoplasm of oropharynx, unspecified: Secondary | ICD-10-CM | POA: Insufficient documentation

## 2011-03-30 MED ORDER — FLUDEOXYGLUCOSE F - 18 (FDG) INJECTION
17.4000 | Freq: Once | INTRAVENOUS | Status: AC | PRN
  Administered 2011-03-30: 17.4 via INTRAVENOUS

## 2011-04-06 ENCOUNTER — Other Ambulatory Visit: Payer: Self-pay | Admitting: *Deleted

## 2011-04-06 DIAGNOSIS — C109 Malignant neoplasm of oropharynx, unspecified: Secondary | ICD-10-CM

## 2011-04-07 ENCOUNTER — Ambulatory Visit (HOSPITAL_COMMUNITY): Payer: Medicaid Other | Admitting: Dentistry

## 2011-04-07 ENCOUNTER — Ambulatory Visit (HOSPITAL_BASED_OUTPATIENT_CLINIC_OR_DEPARTMENT_OTHER): Payer: Medicaid Other | Admitting: Oncology

## 2011-04-07 ENCOUNTER — Other Ambulatory Visit: Payer: Self-pay | Admitting: Oncology

## 2011-04-07 ENCOUNTER — Other Ambulatory Visit: Payer: Self-pay | Admitting: Lab

## 2011-04-07 VITALS — BP 125/70 | HR 65 | Temp 97.2°F

## 2011-04-07 VITALS — BP 118/74 | HR 59 | Temp 97.6°F | Ht 69.0 in | Wt 144.3 lb

## 2011-04-07 DIAGNOSIS — R599 Enlarged lymph nodes, unspecified: Secondary | ICD-10-CM

## 2011-04-07 DIAGNOSIS — C109 Malignant neoplasm of oropharynx, unspecified: Secondary | ICD-10-CM

## 2011-04-07 DIAGNOSIS — K08109 Complete loss of teeth, unspecified cause, unspecified class: Secondary | ICD-10-CM

## 2011-04-07 DIAGNOSIS — K117 Disturbances of salivary secretion: Secondary | ICD-10-CM

## 2011-04-07 DIAGNOSIS — E46 Unspecified protein-calorie malnutrition: Secondary | ICD-10-CM

## 2011-04-07 DIAGNOSIS — Z463 Encounter for fitting and adjustment of dental prosthetic device: Secondary | ICD-10-CM

## 2011-04-07 DIAGNOSIS — F17200 Nicotine dependence, unspecified, uncomplicated: Secondary | ICD-10-CM

## 2011-04-07 LAB — CBC WITH DIFFERENTIAL/PLATELET
BASO%: 0.2 % (ref 0.0–2.0)
EOS%: 1 % (ref 0.0–7.0)
LYMPH%: 7.1 % — ABNORMAL LOW (ref 14.0–49.0)
MCH: 31.5 pg (ref 27.2–33.4)
MCHC: 34.5 g/dL (ref 32.0–36.0)
MONO#: 0.4 10*3/uL (ref 0.1–0.9)
MONO%: 6.2 % (ref 0.0–14.0)
Platelets: 184 10*3/uL (ref 140–400)
RBC: 3.96 10*6/uL — ABNORMAL LOW (ref 4.20–5.82)
WBC: 7.2 10*3/uL (ref 4.0–10.3)

## 2011-04-07 LAB — COMPREHENSIVE METABOLIC PANEL
ALT: 10 U/L (ref 0–53)
AST: 10 U/L (ref 0–37)
Alkaline Phosphatase: 86 U/L (ref 39–117)
CO2: 22 mEq/L (ref 19–32)
Creatinine, Ser: 1.32 mg/dL (ref 0.50–1.35)
Sodium: 140 mEq/L (ref 135–145)
Total Bilirubin: 0.3 mg/dL (ref 0.3–1.2)
Total Protein: 6.8 g/dL (ref 6.0–8.3)

## 2011-04-07 NOTE — Progress Notes (Signed)
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  PAST THERAPY: Between October 30, 2010, and November 09, 2010, for q3wk cisplatin chemotherapy (x2 doses out of 3 due to renal insufficiency).  Radiation therapy was given between October 27, 2010, and December 23, 2010.  Of note, he had a lot of breaks in radiation therapy due to poor adherence.  CURRENT THERAPY: watchful observation.   INTERVAL HISTORY: [name] [name] 56 y.o. male returns to clinic with his girlfriend for routine visit.  He had a recent restaging PET scan.  He reports that he still has mild residual fatigue; however, this is much better compared to when he was on therapy.  He is independent of all activities of daily living.  He still has xerostomia; however, he is able to eat solid foods this past month.  He has had further weight loss since the transition to oral intake from PEG.  His PEG has been hurting him and he wants this removed soon.  He does not have dysphagia, odynophagia, palpable neck nodes.  He still smokes cigarettes.  He no longer has mucositis and does not need any pain med.   Patient denies headache, visual changes, confusion, drenching night sweats, nausea vomiting, jaundice, chest pain, palpitation, shortness of breath, dyspnea on exertion, productive cough, gum bleeding, epistaxis, hematemesis, hemoptysis, abdominal pain, abdominal swelling, early satiety, melena, hematochezia, hematuria, skin rash, spontaneous bleeding, joint swelling, joint pain, heat or cold intolerance, bowel bladder incontinence, back pain, focal motor weakness, paresthesia, depression, suicidal or homocidal ideation, feeling hopelessness.   MEDICAL HISTORY: Past Medical History  Diagnosis Date  . Oropharyngeal cancer 2012  . Renal insufficiency 2012    SURGICAL  HISTORY:  Past Surgical History  Procedure Date  . Appendectomy     MEDICATIONS: Current Outpatient Prescriptions  Medication Sig Dispense Refill  . Alum & Mag Hydroxide-Simeth (MAGIC MOUTHWASH) SOLN Take 15 mLs by mouth 4 (four) times daily.  240 mL  0    ALLERGIES:   has no known allergies.  REVIEW OF SYSTEMS:  The rest of the 14-point review of system was negative.   Filed Vitals:   04/07/11 1203  BP: 118/74  Pulse: 59  Temp: 97.6 F (36.4 C)   Wt Readings from Last 3 Encounters:  04/07/11 144 lb 4.8 oz (65.454 kg)  12/28/10 152 lb 11.2 oz (69.264 kg)  01/15/11 152 lb (68.947 kg)   ECOG Performance status: 1  PHYSICAL EXAMINATION:  General:  Thin-appearing male in no acute distress.  Eyes:  no scleral icterus.  ENT:  There were no oropharyngeal lesions.  Neck was without thyromegaly.  Lymphatics:  Negative cervical, supraclavicular or axillary adenopathy.  Respiratory: lungs were clear bilaterally without wheezing or crackles.  Cardiovascular:  Regular rate and rhythm, S1/S2, without murmur, rub or gallop.  There was no pedal edema.  GI:  abdomen was soft, flat, nontender, nondistended, without organomegaly.  PEG tube was dry, clean, intact without purulent discharge or pain on palpation.  Muscoloskeletal:  no spinal tenderness of palpation of vertebral spine.  Skin exam was without echymosis, petichae.  Neuro exam was nonfocal.  Patient was able to get on and off exam table without assistance.  Gait was normal.  Patient was alerted and oriented.  Attention was good.   Language was appropriate.  Mood was normal without  depression.  Speech was not pressured.  Thought content was not tangential.    LABORATORY/RADIOLOGY DATA:  Lab Results  Component Value Date   WBC 7.2 04/07/2011   HGB 12.4* 04/07/2011   HCT 36.1* 04/07/2011   PLT 184 04/07/2011   GLUCOSE 106* 04/07/2011   ALT 10 04/07/2011   AST 10 04/07/2011   NA 140 04/07/2011   K 4.2 04/07/2011   CL 107 04/07/2011   CREATININE 1.32  04/07/2011   BUN 19 04/07/2011   CO2 22 04/07/2011   INR 0.97 10/13/2010    IMAGING:  I personally reviewed the following PET scan and showed the patient and his relative the images.  There was resolution of the primary cancer and right cervical nodes.  However, there was a very slight uptake of a 1cm left cervical level 2 node.   Nm Pet Image Restag (ps) Skull Base To Thigh  03/30/2011  *RADIOLOGY REPORT*  Clinical Data: Subsequent treatment strategy for oropharyngeal carcinoma.  Interval chemotherapy and radiation therapy.  NUCLEAR MEDICINE PET CT RESTAGING (PS) SKULL BASE TO THIGH  Technique:  17.4 mCi F-18 FDG was injected intravenously via the left arm.  Full-ring PET imaging was performed from the skull base through the mid-thighs 69  minutes after injection.  CT data was obtained and used for attenuation correction and anatomic localization only.  (This was not acquired as a diagnostic CT examination.)  Fasting Blood Glucose:  91  Patient Weight:  150 pounds.  Comparison: 08/25/2010  Findings: There has been interval resolution of right cervical jugular chain lymphadenopathy since the prior exam.  There has also been resolution of asymmetric hypermetabolic adenopathy in the palatine tonsils since prior exam.  A small less than 1 cm left upper internal jugular (level II B) lymph node shows no change in size, but does show increased mild hypermetabolic activity with a maximum SUV of 3.0.  No other hypermetabolic lymph nodes are seen within the neck.  No hypermetabolic soft tissue masses or lymphadenopathy identified within the chest, abdomen, or pelvis.  IMPRESSION:  1.  Resolution of the hypermetabolic right cervical adenopathy and activity within the palatine tonsils since prior exam. 2.  Less than 1 cm left upper internal jugular lymph node is unchanged in size, but shows mildly increased hypermetabolic activity.  This is of uncertain etiology and clinical significance. 3.  No evidence of metastatic  disease within the chest, abdomen, or pelvis.  Original Report Authenticated By: Danae Orleans, M.D.     ASSESSMENT AND PLAN:   1. History of squamous cell carcinoma mostly oropharynx in origin:  I discussed with Mr. Fecher and his fiancee that he has no evidence of residual disease on the PET scan.  He has an 1cm left neck node with very faint uptake on PET.  I have low pretest probability of this being residual disease as his disease in the right neck has completely responded to chemoradiation.  I discussed with the patient and his girlfriend that the aggressive option would be proceeding with left neck dissection at this time.  However it is not unreasonable to consider observation and repeating a CT of the neck in about 4 months to assess progression of this left cervical neck node.  I referred Mr. Defino to Dr. Jenne Pane for his opinion.  I requested a CT of the neck in about 4 months. 2. Mucositis:  Resolved.  3. Calorie-protein malnutrition: Even though his weight has slightly decreased compared to last visit; he is PEG  tube independent now and has been able to eat by mouth for one month. I referred him to interventional radiology for removal of the PEG tube within the next week. 4. Renal insufficiency secondary to history of cisplatin and dehydration:  Resolved. 5. Smoking:  I advised Mr. Herbers and his girlfriend that he is at extremely high risk of recurrence and metastatic disease given that he is still smoking cigarettes.  I had given him Rx for nicotine patch in the past however he has been using them.  I again recommended nicotine patch or smokeless cigarette. I referred him to smoking cessation class. 6. Followup: CT of the neck in about 4 months. He has lab and return visit with me the after CT scan of the neck in 4 months. I encouraged him to follow up with Dr. Basilio Cairo and Dr. Jenne Pane as well.  The length of time of the face-to-face encounter was 25 minutes. More than 50% of time was spent  counseling and coordination of care.

## 2011-04-07 NOTE — Progress Notes (Signed)
Wednesday, April 07, 2011   BP: 125/70             P: 65           T: 97.2   [name] [name] presents for continued upper and lower complete denture fabrication. Procedure: Upper and lower complete denture border molding and final impressions in Aquasil. Patient tolerated procedure well. To [facility] for custom baseplates with rims. Return to clinic for upper and lower complete denture jaw relations. Dr. Cindra Eves

## 2011-04-08 ENCOUNTER — Other Ambulatory Visit: Payer: Self-pay | Admitting: Oncology

## 2011-04-08 ENCOUNTER — Telehealth: Payer: Self-pay | Admitting: Oncology

## 2011-04-08 DIAGNOSIS — C109 Malignant neoplasm of oropharynx, unspecified: Secondary | ICD-10-CM

## 2011-04-08 NOTE — Telephone Encounter (Signed)
called pts lmovm for appt for [id number] including ct scan appt and instruction. asked pt to rtn call to confirm appts

## 2011-04-09 ENCOUNTER — Telehealth: Payer: Self-pay | Admitting: *Deleted

## 2011-04-09 ENCOUNTER — Ambulatory Visit
Admission: RE | Admit: 2011-04-09 | Discharge: 2011-04-09 | Disposition: A | Discharge status: Home / Self Care | Payer: Medicaid Other | Source: Ambulatory Visit | Attending: Radiation Oncology | Admitting: Radiation Oncology

## 2011-04-09 ENCOUNTER — Encounter: Payer: Self-pay | Admitting: Radiation Oncology

## 2011-04-09 DIAGNOSIS — C770 Secondary and unspecified malignant neoplasm of lymph nodes of head, face and neck: Secondary | ICD-10-CM

## 2011-04-09 HISTORY — DX: Personal history of irradiation: Z92.3

## 2011-04-09 HISTORY — DX: Unspecified osteoarthritis, unspecified site: M19.90

## 2011-04-09 MED ORDER — PILOCARPINE HCL 5 MG PO TABS: 5.0000 mg | ORAL_TABLET | Freq: Three times a day (TID) | ORAL | Status: AC

## 2011-04-09 NOTE — Telephone Encounter (Signed)
Attempted to call patient to talk about our smoking cessation classes. Patient was not available at time of phone call. Left message with patient's family member to call me back.

## 2011-04-09 NOTE — Progress Notes (Addendum)
Main Complaint today is the difficulty eating since he has dry mouth.  States it is difficult to move food around in his mouth to swallow and he has to drink liquids with every bite of food. Oral mucosa lightly moist, intact and pink.   Denies any odonyphagia or dysphagia.   No other complaints.  States he is ready to get his dentures.

## 2011-04-09 NOTE — Progress Notes (Signed)
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 He had a PET scan on 03-30-11 which revealed excellent response to radiotherapy although there is a 1 cm left upper internal jugular lymph node that has mildly increased hypermetabolic activity. The patient has plans for a CT scan of his neck in about 4 months to follow this up.  He is otherwise doing pretty well. He has dentures being made by Dr. Kirtland Bouchard. He has suboptimal salivary function and would like a medication for this. He has been using Biotine. Dr. [name] has referred the patient for a smoking cessation class. This is a difficult habit for him to stop.  The patient has an appointment next week for PEG tube removal. He is now taking all of his nutrition by mouth.   ALLERGIES: Review of patient's allergies indicates no known allergies.   MEDICATIONS:  Current Outpatient Prescriptions  Medication Sig Dispense Refill  . Alum & Mag Hydroxide-Simeth (MAGIC MOUTHWASH) SOLN Take 15 mLs by mouth 4 (four) times daily.  240 mL  0  . pilocarpine (SALAGEN) 5 MG tablet Take 1 tablet (5 mg total) by mouth 3 (three) times daily.  90 tablet  3        PHYSICAL EXAM:   weight is 144 lb 8 oz (65.545 kg). His oral temperature is 97 F (36.1 C). His blood pressure is 127/81 and his pulse is 62. His respiration is 18.  He is in no acute distress. His oropharynx demonstrates somewhat dry mucous membranes but he does have salivary function. There is no thrush. I cannot palpate any concerning lesions in his mouth or base of tongue. He is edentulous. His neck is supple with no palpable lymphadenopathy. There is some subcutaneous edema in his neck. His skin has healed well.   LABORATORY  DATA:  Lab Results  Component Value Date   WBC 7.2 04/07/2011   HGB 12.4* 04/07/2011   HCT 36.1* 04/07/2011   MCV 91.3 04/07/2011   PLT 184 04/07/2011   CMP     Component Value Date/Time   NA 140 04/07/2011 1145   K 4.2 04/07/2011 1145   CL 107 04/07/2011 1145   CO2 22 04/07/2011 1145   GLUCOSE 106* 04/07/2011 1145   BUN 19 04/07/2011 1145   CREATININE 1.32 04/07/2011 1145   CALCIUM 9.7 04/07/2011 1145   PROT 6.8 04/07/2011 1145   ALBUMIN 4.2 04/07/2011 1145   AST 10 04/07/2011 1145   ALT 10 04/07/2011 1145   ALKPHOS 86 04/07/2011 1145   BILITOT 0.3 04/07/2011 1145      RADIOGRAPHIC STUDIES:  As above   IMPRESSION/PLAN: He has recovered well from radiotherapy. He will have a CT scan to followup the slightly hypermetabolic lymph node in about 4 months. I think it is important that we follow him closely as he had suboptimal compliance during his radiotherapy and did have a significant treatment break (due to poor attendance) which somewhat compromises his chance of cure.   I will see him back in about 4 months. I offered a referral to physical therapy for the subcutaneous edema in his neck but he would rather hold off on that. He will have his PEG tube removed next week.  Hopefully he will follow through the smoking cessation classes. 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 I did speak with him about the risks benefits and side effects of this medication. He is enthusiastic about trying this.  As above, I will see him back in about 4 months. He will continue to be followed by the other specialists as well.

## 2011-04-09 NOTE — Telephone Encounter (Signed)
Patient called me back. Talked to him about our smoking cessation program. Jovita Gamma him the dates, time and location. Patient interested in signing up. Have signed patient up for class per his request. Told patient will leave the information with all the details of class times, dates, and location at the front desk with [facility] for him to pick up since he has an appointment today. Patient verbalized understanding.

## 2011-04-12 ENCOUNTER — Other Ambulatory Visit: Payer: Self-pay | Admitting: Oncology

## 2011-04-12 DIAGNOSIS — C109 Malignant neoplasm of oropharynx, unspecified: Secondary | ICD-10-CM

## 2011-04-13 ENCOUNTER — Encounter: Payer: Self-pay | Admitting: Oncology

## 2011-04-13 ENCOUNTER — Ambulatory Visit (HOSPITAL_COMMUNITY)
Admission: RE | Admit: 2011-04-13 | Discharge: 2011-04-13 | Disposition: A | Discharge status: Home / Self Care | Payer: Medicaid Other | Source: Ambulatory Visit | Attending: Oncology | Admitting: Oncology

## 2011-04-13 DIAGNOSIS — C109 Malignant neoplasm of oropharynx, unspecified: Secondary | ICD-10-CM

## 2011-04-13 DIAGNOSIS — R599 Enlarged lymph nodes, unspecified: Secondary | ICD-10-CM

## 2011-04-13 HISTORY — PX: PEG TUBE REMOVAL: SHX2187

## 2011-04-13 NOTE — Procedures (Signed)
Gastrostomy tube is no longer needed.  Gastrostomy removed without complication.

## 2011-04-13 NOTE — Progress Notes (Unsigned)
Received office notes from [facility]; forwarded to Dr. Ha. 

## 2011-04-13 NOTE — Progress Notes (Signed)
Patient received one prescription from [facility] on 04/12/11 $49.79,his remaning balance [facility] $168.12.

## 2011-04-14 ENCOUNTER — Telehealth: Payer: Self-pay | Admitting: Oncology

## 2011-04-14 NOTE — Telephone Encounter (Signed)
called pt home lmovm for appt on 05/31/2011

## 2011-04-19 ENCOUNTER — Ambulatory Visit (HOSPITAL_COMMUNITY): Payer: Self-pay | Admitting: Dentistry

## 2011-04-19 VITALS — BP 124/81 | HR 81 | Temp 97.1°F

## 2011-04-19 DIAGNOSIS — K117 Disturbances of salivary secretion: Secondary | ICD-10-CM

## 2011-04-19 DIAGNOSIS — K08109 Complete loss of teeth, unspecified cause, unspecified class: Secondary | ICD-10-CM

## 2011-04-19 NOTE — Progress Notes (Signed)
Monday, April 19, 2011  BP: 124/81           P: 69            T: 97.1   [name] [name] presents for continued upper and lower complete denture fabrication. Procedure: Upper and lower complete denture Jaw relations with aluwax bite registration. Patient  agrees to tooth selection of 11H, S, and 10 degree posteriors to match with Portrait A2 shade.  Patient tolerated procedure well. Return to clinic for upper and lower complete denture wax tryin.   Dr. Cindra Eves

## 2011-04-23 NOTE — Telephone Encounter (Signed)
XXXX 

## 2011-04-28 ENCOUNTER — Ambulatory Visit (HOSPITAL_COMMUNITY): Payer: Self-pay | Admitting: Dentistry

## 2011-04-28 VITALS — BP 130/75 | HR 83 | Temp 97.4°F

## 2011-04-28 DIAGNOSIS — K08109 Complete loss of teeth, unspecified cause, unspecified class: Secondary | ICD-10-CM

## 2011-04-28 DIAGNOSIS — K117 Disturbances of salivary secretion: Secondary | ICD-10-CM

## 2011-04-28 DIAGNOSIS — Z463 Encounter for fitting and adjustment of dental prosthetic device: Secondary | ICD-10-CM

## 2011-04-28 NOTE — Progress Notes (Signed)
Wednesday, April 28, 2011  BP:130/75                      P:83                    T: 97.4   [name] [name] presents for continued upper and lower complete denture fabrication. Procedure: Upper and lower complete denture wax tryin. Patient accepts esthetics, phonetics, fit and function. Patient agrees to process "as is" in [place] [address]. Patient to return to clinic for upper and lower complete denture insertion.  Dr. Cindra Eves

## 2011-05-03 ENCOUNTER — Ambulatory Visit: Payer: Self-pay | Admitting: Oncology

## 2011-05-06 ENCOUNTER — Encounter (HOSPITAL_COMMUNITY): Payer: Self-pay | Admitting: Dentistry

## 2011-05-06 ENCOUNTER — Ambulatory Visit (HOSPITAL_COMMUNITY): Payer: Self-pay | Admitting: Dentistry

## 2011-05-06 VITALS — BP 124/71 | HR 72 | Temp 97.9°F

## 2011-05-06 NOTE — Patient Instructions (Signed)
Instructions for Denture Use and Care  Congratulations, you are on the way to oral rehabilitation!  You have just received a new set of complete or partial dentures.  These prostheses will help to improve both your appearance and chewing ability.  These instructions will help you get adjusted to your dentures as well as care for them properly.  Please read these instructions carefully and completely as soon as you get home.  If you or your caregiver have any questions please notify the [facility] at [phone].  HOW YOUR DENTURES LOOK AND FEEL Soon after you begin wearing your dentures, you may feel that your dentures are too large or even loose.  As our mouth and facial muscles become accustomed to the dentures, these feelings will go away.  You also may feel that you are salivating more than you normally do.  This feeling should go away as you get used to having the dentures in your mouth.  You may bite your cheek or your tongue; this will eventually resolve itself as you wear your dentures.  Some soreness is to be expected, but you should not hurt.  If your mouth hurts, call your dentist.  A denture adhesive may occasionally be necessary to hold your dentures in place more securely.  The dentist will let you know when one is recommended for you.  SPEAKING Wearing dentures will change the sound of your voice initially.  This will be noticed by you more than anyone else.  Bite and swallow before you speak, in order to place your dentures in position so that you may speak more clearly.  Practice speaking by reading aloud or counting from 1 to 100 very slowly and distinctly.  After some practice your mouth will become accustomed to your dentures and you will speak more clearly.  EATING Chewing will definitely be different after you receive your dentures.  With a little practice and patience you should be able to eat just about any kind of food.  Begin by eating small quantities of food  that are cut into small pieces.  Star with soft foods such as eggs, cooked vegetables, or puddings.  As you gain confidence advance  Your diet to whatever texture foods you can tolerate.  DENTURE CARE Dentures can collect plaque and calculus much the same as natural teeth can.  If not removed on a regular basis, your dentures will not look or feel clean, and you will experience denture odor.  It is very important that you remove your dentures at bedtime and clean them thoroughly.  You should: 1. Clean your dentures over a sink full of water so if dropped, breakage will be prevented. 2. Rinse your dentures with cool water to remove any large food particles. 3. Use soap and water or a denture cleanser or paste to clean the dentures.  Do not use regular toothpaste as it may abrade the denture base or teeth. 4. Use a moistened denture brush to clean all surfaces (inside and outside). 5. Rinse thoroughly to remove any remaining soap or denture cleanser. 6. Use a soft bristle toothbrush to gently brush any natural teeth, gums, tongue, and palate at bedtime and before reinserting your dentures. 7. Do not sleep with your dentures in your mouth at night.  Remove your dentures and soak them overnight in a denture cup filled with water or denture solution as recommended by your dentist.  This routine will become second nature and will increase the life and comfort   of your dentures.  Please do not try to adjust these dentures yourself; you could damage them.  FOLLOW-UP You should call or make an appointment with your dentist.  Your dentist would like to see you at least once a year for a check-up and examination. 

## 2011-05-06 NOTE — Progress Notes (Signed)
05/06/2011  BP 124/71  Pulse 72  Temp(Src) 97.9 F (36.6 C) (Oral)  [name] [name] presents for insertion of upper and lower complete dentures. Procedure: Pressure indicating paste applied to dentures. Adjustments made as needed. [name]. Occlusion evaluated and adjustments made as needed for Centric Relation and protrusive strokes. Good esthetics, phonetics, fit, and function noted. Patient accepts results. Post op instructions provided in written and verbal formats on use and care of dentures. Gave patient denture brush and cup. Patient to keep dentures out if sore spots develop. Use salt water rinses as needed to aid healing. Return to clinic as scheduled for denture adjustment.  Call if problems arise before then. Patient dismissed in stable condition.  Charlynne Pander, DDS

## 2011-05-10 ENCOUNTER — Encounter (HOSPITAL_COMMUNITY): Payer: Medicaid - Dental | Admitting: Dentistry

## 2011-05-12 ENCOUNTER — Ambulatory Visit (HOSPITAL_COMMUNITY): Payer: Medicaid - Dental | Admitting: Dentistry

## 2011-05-12 VITALS — BP 125/75 | HR 83 | Temp 97.0°F

## 2011-05-12 DIAGNOSIS — Z463 Encounter for fitting and adjustment of dental prosthetic device: Secondary | ICD-10-CM

## 2011-05-12 DIAGNOSIS — K062 Gingival and edentulous alveolar ridge lesions associated with trauma: Secondary | ICD-10-CM

## 2011-05-12 DIAGNOSIS — K1370 Unspecified lesions of oral mucosa: Secondary | ICD-10-CM

## 2011-05-12 NOTE — Progress Notes (Signed)
\  Wednesday, May 12, 2011   BP: 125/75        P:  83       T: 97.0  [name] [name] presents for evaluation of recently inserted upper and lower complete dentures. Subjective: Patient is having problems with denture irritation to the maxillary right anterior area #6. Exam: No signs of denture ulceration or erythema is noted. Procedure: Pressure indicating paste was applied to upper and lower complete dentures. Adjustments made as needed. [name]. Occlusion evaluated and adjustments made as needed for centric relation and protrusive strokes. The patient accepts results. Patient reminded to keep dentures out if sore spots develop. Use salt water rinses as needed to aid healing. RTC as scheduled for denture adjustment. Call if problems arise before then. Patient dismissed in stable condition.  Dr. Cindra Eves

## 2011-05-24 ENCOUNTER — Ambulatory Visit (HOSPITAL_COMMUNITY): Payer: Medicaid - Dental | Admitting: Dentistry

## 2011-05-24 VITALS — BP 127/68 | HR 56 | Temp 97.4°F

## 2011-05-24 DIAGNOSIS — Z463 Encounter for fitting and adjustment of dental prosthetic device: Secondary | ICD-10-CM

## 2011-05-24 DIAGNOSIS — K062 Gingival and edentulous alveolar ridge lesions associated with trauma: Secondary | ICD-10-CM

## 2011-05-24 DIAGNOSIS — K1370 Unspecified lesions of oral mucosa: Secondary | ICD-10-CM

## 2011-05-24 NOTE — Progress Notes (Signed)
Monday, May 24, 2011   BP: 127/68            P:  56             T:  97.4  [name] [name] presents for evaluation of recently inserted upper and lower complete dentures. Subjective: Patient is not having problems any problems with denture irritation. Exam: No signs of denture ulceration or erythema is noted. Procedure: Pressure indicating paste was applied to upper and lower complete dentures. Adjustments made as needed. [name]. Occlusion evaluated and adjustments made as needed for centric relation and protrusive strokes. The patient accepts results. Patient reminded to keep dentures out if sore spots develop. Use salt water rinses as needed to aid healing. RTC as scheduled for denture adjustment. Call if problems arise before then. Patient dismissed in stable condition. Dr. Cindra Eves

## 2011-05-31 ENCOUNTER — Other Ambulatory Visit: Payer: Self-pay | Admitting: Lab

## 2011-05-31 ENCOUNTER — Ambulatory Visit (HOSPITAL_BASED_OUTPATIENT_CLINIC_OR_DEPARTMENT_OTHER): Payer: Medicaid Other | Admitting: Oncology

## 2011-05-31 VITALS — BP 135/73 | HR 50 | Temp 97.2°F | Ht 69.0 in | Wt 139.5 lb

## 2011-05-31 DIAGNOSIS — F17200 Nicotine dependence, unspecified, uncomplicated: Secondary | ICD-10-CM

## 2011-05-31 DIAGNOSIS — E46 Unspecified protein-calorie malnutrition: Secondary | ICD-10-CM

## 2011-05-31 DIAGNOSIS — C109 Malignant neoplasm of oropharynx, unspecified: Secondary | ICD-10-CM

## 2011-05-31 HISTORY — DX: Unspecified protein-calorie malnutrition: E46

## 2011-05-31 LAB — CBC WITH DIFFERENTIAL/PLATELET
BASO%: 0.4 % (ref 0.0–2.0)
HCT: 37.8 % — ABNORMAL LOW (ref 38.4–49.9)
LYMPH%: 9.9 % — ABNORMAL LOW (ref 14.0–49.0)
MCHC: 33.9 g/dL (ref 32.0–36.0)
MCV: 89.1 fL (ref 79.3–98.0)
MONO%: 5.5 % (ref 0.0–14.0)
NEUT%: 83.2 % — ABNORMAL HIGH (ref 39.0–75.0)
Platelets: 158 10*3/uL (ref 140–400)
RBC: 4.24 10*6/uL (ref 4.20–5.82)
nRBC: 0 % (ref 0–0)

## 2011-05-31 LAB — BASIC METABOLIC PANEL
BUN: 17 mg/dL (ref 6–23)
CO2: 22 mEq/L (ref 19–32)
Calcium: 9.6 mg/dL (ref 8.4–10.5)
Chloride: 107 mEq/L (ref 96–112)
Creatinine, Ser: 1.47 mg/dL — ABNORMAL HIGH (ref 0.50–1.35)

## 2011-05-31 MED ORDER — BUPROPION HCL ER (SMOKING DET) 150 MG PO TB12: 150.0000 mg | ORAL_TABLET | Freq: Two times a day (BID) | ORAL | Status: AC

## 2011-05-31 NOTE — Progress Notes (Signed)
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  PAST THERAPY: Between October 30, 2010, and November 09, 2010, for q3wk cisplatin chemotherapy (x2 doses out of 3 due to renal insufficiency).  Radiation therapy was given between October 27, 2010, and December 23, 2010.  Of note, he had a lot of breaks in radiation therapy due to poor adherence.    CURRENT THERAPY: watchful observation.   INTERVAL HISTORY: [name] [name] 56 y.o. male returns to clinic by himself for regular close follow up.  His 40-month post treatment PET scan showed resolution of primary disease site but slight uptake in the left cervical node.  He had his denture placed a few weeks ago.  He is still learning how to chew foods with it.  He no longer has PEG tube.  His weight has thus slightly decreased due to complete oral intake with an unfamiliar denture. He still smokes about 1/2 pack cigarettes a day.  He has xerostomia but without dysphagia, odynophagia, palpable neck node.  He reports feeling depressed at time having gone through the treatment for cancer.  He still has not found a job. He has mild fatigue, but he is independent of activities of daily living.   Patient denies headache, visual changes, confusion, drenching night sweats, palpable lymph node swelling, mucositis, odynophagia, dysphagia, nausea vomiting, jaundice, chest pain, palpitation, shortness of breath, dyspnea on exertion, productive cough, gum bleeding, epistaxis, hematemesis, hemoptysis, abdominal pain, abdominal swelling, early satiety, melena, hematochezia, hematuria, skin rash, spontaneous bleeding, joint swelling, joint pain, heat or cold intolerance, bowel bladder incontinence, back pain, focal motor weakness, paresthesia, depression, suicidal or homocidal ideation, feeling  hopelessness.    MEDICAL HISTORY: Past Medical History  Diagnosis Date  . Oropharyngeal cancer 2012  . Renal insufficiency 2012  . Status post radiation therapy 10/11/10 - 12/24/10    Bilateral Neck and Mucosa axis  . Arthritis     SURGICAL HISTORY:  Past Surgical History  Procedure Date  . Appendectomy   . Biopsy tongue     TonsilandBaseoftongue    MEDICATIONS: Current Outpatient Prescriptions  Medication Sig Dispense Refill  . Alum & Mag Hydroxide-Simeth (MAGIC MOUTHWASH) SOLN Take 15 mLs by mouth 4 (four) times daily.  240 mL  0  . pilocarpine (SALAGEN) 5 MG tablet Take 1 tablet (5 mg total) by mouth 3 (three) times daily.  90 tablet  3    ALLERGIES:   has no known allergies.  REVIEW OF SYSTEMS:  The rest of the 14-point review of system was negative.   Filed Vitals:   05/31/11 1230  BP: 135/73  Pulse: 50  Temp: 97.2 F (36.2 C)   Wt Readings from Last 3 Encounters:  05/31/11 139 lb 8 oz (63.277 kg)  04/09/11 144 lb 8 oz (65.545 kg)  04/07/11 144 lb 4.8 oz (65.454 kg)   ECOG Performance status: 1  PHYSICAL EXAMINATION:  General:  Thin-appearing male in no acute distress.  Eyes:  no scleral icterus.  ENT:  There were no oropharyngeal lesions.  Neck was without thyromegaly.  There was no submental edema.  Lymphatics:  Negative cervical, supraclavicular or axillary adenopathy.  Respiratory: lungs were clear bilaterally without wheezing or crackles.  Cardiovascular:  Regular rate and rhythm, S1/S2, without murmur, rub or gallop.  There was no pedal edema.  GI:  abdomen  was soft, flat, nontender, nondistended, without organomegaly.   Muscoloskeletal:  no spinal tenderness of palpation of vertebral spine.  Skin exam was without echymosis, petichae.  Neuro exam was nonfocal.  Patient was able to get on and off exam table without assistance.  Gait was normal.  Patient was alerted and oriented.  Attention was good.   Language was appropriate.  Mood was normal without  depression.  Speech was not pressured.  Thought content was not tangential.    LABORATORY/RADIOLOGY DATA:  Lab Results  Component Value Date   WBC 6.6 05/31/2011   HGB 12.8* 05/31/2011   HCT 37.8* 05/31/2011   PLT 158 05/31/2011   GLUCOSE 106* 04/07/2011   ALT 10 04/07/2011   AST 10 04/07/2011   NA 140 04/07/2011   K 4.2 04/07/2011   CL 107 04/07/2011   CREATININE 1.32 04/07/2011   BUN 19 04/07/2011   CO2 22 04/07/2011   INR 0.97 10/13/2010     ASSESSMENT AND PLAN:   1. History of squamous cell carcinoma mostly oropharynx in origin:  I discussed with Mr. Tamburrino that he had a 1cm left neck node with very faint uptake on PET in Feb 2013.  I have low pretest probability of this being residual disease as his disease in the right neck has completely responded to chemoradiation.  On close follow up today, he has no palpable left neck node.  For surveillance, he has a neck CT scheduled for June 2013 and to see me the day after.  2. Mucositis:  Resolved.  3. Calorie-protein malnutrition: he is taking oral now without PEG.  He is trying to get use to the denture to increase his intake.  4. Renal insufficiency secondary to history of cisplatin and dehydration:  Stable.  There was no sign of fluid overload.  5. Smoking:  I again explained that he is at very high risk of recurrence of HNSCC with continuation of smoking.   I referred him to smoking cessation class 2 months ago.  He is still smoking.  He reported that he has depression which is interfering with his effort to quit.  I prescribed Buproprion 150mg  PO daily x 3 days followed by 150mg  PO BID.  He has left over nicotine patch from before.  6. Depression:  I started him on Buproprion.   The length of time of the face-to-face encounter was 15 minutes. More than 50% of time was spent counseling and coordination of care.

## 2011-06-04 ENCOUNTER — Encounter: Payer: Self-pay | Admitting: Oncology

## 2011-06-04 NOTE — Progress Notes (Signed)
Patient received one prescription from [facility] pharmacy on 05/31/11 $53.25,his remaninig balance [facility] $114.87.

## 2011-06-12 DIAGNOSIS — F32A Depression, unspecified: Secondary | ICD-10-CM

## 2011-06-12 DIAGNOSIS — F17200 Nicotine dependence, unspecified, uncomplicated: Secondary | ICD-10-CM

## 2011-06-12 DIAGNOSIS — IMO0001 Reserved for inherently not codable concepts without codable children: Secondary | ICD-10-CM

## 2011-06-12 HISTORY — DX: Nicotine dependence, unspecified, uncomplicated: F17.200

## 2011-06-12 HISTORY — DX: Depression, unspecified: F32.A

## 2011-06-12 HISTORY — DX: Reserved for inherently not codable concepts without codable children: IMO0001

## 2011-08-05 ENCOUNTER — Other Ambulatory Visit (HOSPITAL_COMMUNITY): Payer: Self-pay

## 2011-08-05 ENCOUNTER — Other Ambulatory Visit: Payer: Self-pay | Admitting: Lab

## 2011-08-06 ENCOUNTER — Encounter: Payer: Self-pay | Admitting: Radiation Oncology

## 2011-08-06 ENCOUNTER — Ambulatory Visit
Admission: RE | Admit: 2011-08-06 | Discharge: 2011-08-06 | Disposition: A | Discharge status: Home / Self Care | Payer: Medicaid Other | Source: Ambulatory Visit | Attending: Radiation Oncology | Admitting: Radiation Oncology

## 2011-08-06 VITALS — BP 127/83 | HR 80 | Temp 97.0°F | Wt 148.3 lb

## 2011-08-06 DIAGNOSIS — C770 Secondary and unspecified malignant neoplasm of lymph nodes of head, face and neck: Secondary | ICD-10-CM

## 2011-08-06 HISTORY — DX: Presence of dental prosthetic device (complete) (partial): K08.109

## 2011-08-06 HISTORY — DX: Complete loss of teeth, unspecified cause, unspecified class: Z97.2

## 2011-08-06 HISTORY — DX: Nicotine dependence, unspecified, uncomplicated: F17.200

## 2011-08-06 HISTORY — DX: Major depressive disorder, single episode, unspecified: F32.9

## 2011-08-06 NOTE — Progress Notes (Signed)
Intermittent pain back of neck and reports arthritis in his back.[name] [name] He went back to work and was only able to work on day because of increase neck and back pain. Full dentures.  Swallowing"okay" but has to drink a lot of fluids to get food down and feels as if food sticks in his mid esophagus at times.  C/o dry mouth and drinks more fluids.  Stopped taking Salagen because he made him sweat and have fevers, but he could not see any appreciable difference in salivary flow.    Stes when he is laying down his hands feel very sore.

## 2011-08-06 NOTE — Progress Notes (Signed)
Encounter addended by: Lonie Peak, MD on: 08/06/2011  7:05 PM<BR>     Documentation filed: Normajean Glasgow VN

## 2011-08-06 NOTE — Progress Notes (Signed)
Radiation Oncology         [phone] ________________________________  Name: [name] [name] MRN: [id number]  Date: 08/06/2011  DOB: 22-Oct-1955  Follow-Up Visit Note  Diagnosis:  TX N2 B. M0 head and neck cancer unknown primary  Interval Since Last Radiation:  The patient completed radiotherapy approximately 7 months ago  Narrative:  The patient returns today for routine follow-up.  He is doing well. His dentures were made recently and they look good. He reports that his energy is slowly getting back to normal. He has a little bit of dysphagia in his distal esophagus but he attributes this to not chewing his food well enough as he is getting used to his dentures. He has not seen his otolaryngologist recently. He had a CT scan which was scheduled for this week but it was canceled and the patient can explain why. Not sure if is the Medicaid issue. He reports that when he flexes or extend his neck there is some tingling to his back down to his toes. He has a history of arthritis but he feels that the neck pain is worse than usual.                          ALLERGIES:   has no known allergies.  Meds: Current Outpatient Prescriptions  Medication Sig Dispense Refill  . Alum & Mag Hydroxide-Simeth (MAGIC MOUTHWASH) SOLN Take 15 mLs by mouth 4 (four) times daily.  240 mL  0  . pilocarpine (SALAGEN) 5 MG tablet Take 1 tablet (5 mg total) by mouth 3 (three) times daily.  90 tablet  3    Physical Findings: The patient is in no acute distress. Patient is alert and oriented.  weight is 148 lb 4.8 oz (67.268 kg). His temperature is 97 F (36.1 C). His blood pressure is 127/83 and his pulse is 80. Marland Kitchen  He is sitting comfortably in no acute distress. Oropharyngeal exam reveals no palpable or visual lesions. No thrush. He is edentulous. Mucous membranes are somewhat dry but there is some salivary function. His neck is smooth with a little bit of subcutaneous edema. No palpable lymph nodes in the neck  Lab  Findings: Lab Results  Component Value Date   WBC 6.6 05/31/2011   HGB 12.8* 05/31/2011   HCT 37.8* 05/31/2011   MCV 89.1 05/31/2011   PLT 158 05/31/2011    CMP     Component Value Date/Time   NA 139 05/31/2011 1205   K 3.7 05/31/2011 1205   CL 107 05/31/2011 1205   CO2 22 05/31/2011 1205   GLUCOSE 96 05/31/2011 1205   BUN 17 05/31/2011 1205   CREATININE 1.47* 05/31/2011 1205   CALCIUM 9.6 05/31/2011 1205   PROT 6.8 04/07/2011 1145   ALBUMIN 4.2 04/07/2011 1145   AST 10 04/07/2011 1145   ALT 10 04/07/2011 1145   ALKPHOS 86 04/07/2011 1145   BILITOT 0.3 04/07/2011 1145     Radiographic Findings: No results found.  Impression:  The patient is recovering from the effects of radiation.    Plan:  Followup in 6 months. Reorder CT scan.  I will refer the patient back to his otolaryngologist as it seems that he's been lost to followup there. According to the notes from February I think that Dr. Jenne Pane had intentions to see him back in one month. The patient does have an issue with compliance. As for his followup imaging which is in canceled for  unclear reasons, I will try to reschedule that as well. If there is anything concerning on the followup scan I can work him back in my schedule earlier.  As for the tingling to the patient's back in toes with flexion of his neck, this could be Lhermitte's sign, which can be caused by radiotherapy and resolves over time. However if it's due to arthritis, I wouldn't expect it to resolve on its own. The patient does not seem particularly bothered by this. He knows let me know if this gets worse.   -----------------------------------  Lonie Peak, MD

## 2011-08-09 ENCOUNTER — Ambulatory Visit: Payer: Self-pay | Admitting: Oncology

## 2011-08-09 ENCOUNTER — Ambulatory Visit: Payer: Medicaid Other | Admitting: Oncology

## 2011-08-09 ENCOUNTER — Telehealth: Payer: Self-pay | Admitting: *Deleted

## 2011-08-09 NOTE — Telephone Encounter (Signed)
CALLED PATIENT TO INFORM OF TEST, SPOKE WITH PATIENT AND HE IS AWARE OF THIS TEST. 

## 2011-08-13 ENCOUNTER — Ambulatory Visit: Payer: Medicaid Other | Admitting: Radiation Oncology

## 2011-08-25 ENCOUNTER — Ambulatory Visit (HOSPITAL_COMMUNITY): Payer: Medicaid - Dental | Admitting: Dentistry

## 2011-08-25 ENCOUNTER — Encounter (HOSPITAL_COMMUNITY): Payer: Self-pay | Admitting: Dentistry

## 2011-08-25 VITALS — BP 121/81 | HR 60 | Temp 97.5°F

## 2011-08-25 DIAGNOSIS — Z463 Encounter for fitting and adjustment of dental prosthetic device: Secondary | ICD-10-CM

## 2011-08-25 DIAGNOSIS — K062 Gingival and edentulous alveolar ridge lesions associated with trauma: Secondary | ICD-10-CM

## 2011-08-25 NOTE — Progress Notes (Signed)
Wednesday, August 25, 2011   BP: 121/81          P: 60         T: 97.5  [name] [name] presents for evaluation of recently inserted upper and lower complete dentures. Subjective: Patient is having problems with lower denture on the buccal extensions in molar regions. Exam: No signs of denture ulceration or erythema is noted. Procedure: Pressure indicating paste was applied to upper and lower complete dentures. Adjustments made as needed.  Thick PIP added to denture borders. Adjustments made and dentures were polished. Occlusion evaluated and adjustments made as needed for centric relation and protrusive strokes. The patient accepts results. Patient reminded to keep dentures out if sore spots develop. Use salt water rinses as needed to aid healing. RTC as scheduled for denture adjustment. Call if problems arise before then. Patient dismissed in stable condition. Dr. Kristin Bruins

## 2011-08-26 DIAGNOSIS — K1370 Unspecified lesions of oral mucosa: Secondary | ICD-10-CM

## 2011-08-26 DIAGNOSIS — K117 Disturbances of salivary secretion: Secondary | ICD-10-CM

## 2011-11-03 ENCOUNTER — Ambulatory Visit (HOSPITAL_COMMUNITY): Payer: Medicaid - Dental | Admitting: Dentistry

## 2011-11-03 ENCOUNTER — Encounter (HOSPITAL_COMMUNITY): Payer: Self-pay | Admitting: Dentistry

## 2011-11-03 VITALS — BP 152/74 | HR 78 | Temp 98.3°F

## 2011-11-03 DIAGNOSIS — Z463 Encounter for fitting and adjustment of dental prosthetic device: Secondary | ICD-10-CM

## 2011-11-03 DIAGNOSIS — K062 Gingival and edentulous alveolar ridge lesions associated with trauma: Secondary | ICD-10-CM

## 2011-11-03 DIAGNOSIS — K117 Disturbances of salivary secretion: Secondary | ICD-10-CM

## 2011-11-03 DIAGNOSIS — K08109 Complete loss of teeth, unspecified cause, unspecified class: Secondary | ICD-10-CM

## 2011-11-03 NOTE — Patient Instructions (Signed)
RTC as scheduled for denture adjustment. Call if problems arise before then.

## 2011-11-03 NOTE — Progress Notes (Signed)
11/03/2011  Patient:            [name] [name] Date of Birth:  12-02-1955 MRN:                [id number]  BP 152/74  Pulse 78  Temp 98.3 F (36.8 C)  [name] presents for evaluation of upper and lower complete dentures.  Subjective: Patient is having no problems with dentures. "They are much better". Exam: No signs of denture ulceration or erythema is noted. Procedure: Pressure indicating paste was applied to upper and lower complete dentures. Adjustments made as needed.  Occlusion evaluated and adjustments made as needed for centric relation and protrusive strokes. The patient accepts results. Patient reminded to keep dentures out if sore spots develop. Use salt water rinses as needed to aid healing. RTC as scheduled for denture adjustment. Call if problems arise before then. Patient dismissed in stable condition. Dr. Kristin Bruins

## 2012-02-07 ENCOUNTER — Ambulatory Visit (HOSPITAL_COMMUNITY)
Admission: RE | Admit: 2012-02-07 | Discharge: 2012-02-07 | Disposition: A | Discharge status: Home / Self Care | Payer: Self-pay | Source: Ambulatory Visit | Attending: Radiation Oncology | Admitting: Radiation Oncology

## 2012-02-07 DIAGNOSIS — C770 Secondary and unspecified malignant neoplasm of lymph nodes of head, face and neck: Secondary | ICD-10-CM | POA: Insufficient documentation

## 2012-02-07 DIAGNOSIS — Z9221 Personal history of antineoplastic chemotherapy: Secondary | ICD-10-CM | POA: Insufficient documentation

## 2012-02-07 DIAGNOSIS — Z923 Personal history of irradiation: Secondary | ICD-10-CM | POA: Insufficient documentation

## 2012-02-07 MED ORDER — IOHEXOL 300 MG/ML  SOLN
100.0000 mL | Freq: Once | INTRAMUSCULAR | Status: AC | PRN
  Administered 2012-02-07: 100 mL via INTRAVENOUS

## 2012-02-09 ENCOUNTER — Encounter: Payer: Self-pay | Admitting: Radiation Oncology

## 2012-02-11 ENCOUNTER — Ambulatory Visit (HOSPITAL_BASED_OUTPATIENT_CLINIC_OR_DEPARTMENT_OTHER)
Admission: RE | Admit: 2012-02-11 | Discharge: 2012-02-11 | Disposition: A | Discharge status: Home / Self Care | Payer: Medicaid Other | Source: Ambulatory Visit | Attending: Radiation Oncology | Admitting: Radiation Oncology

## 2012-02-11 ENCOUNTER — Ambulatory Visit
Admission: RE | Admit: 2012-02-11 | Discharge: 2012-02-11 | Disposition: A | Discharge status: Home / Self Care | Payer: Medicaid Other | Source: Ambulatory Visit | Attending: Radiation Oncology | Admitting: Radiation Oncology

## 2012-02-11 VITALS — BP 152/77 | HR 56 | Resp 20 | Wt 157.2 lb

## 2012-02-11 DIAGNOSIS — C109 Malignant neoplasm of oropharynx, unspecified: Secondary | ICD-10-CM

## 2012-02-11 DIAGNOSIS — C770 Secondary and unspecified malignant neoplasm of lymph nodes of head, face and neck: Secondary | ICD-10-CM

## 2012-02-11 HISTORY — DX: Dry mouth, unspecified: R68.2

## 2012-02-11 HISTORY — DX: Personal history of antineoplastic chemotherapy: Z92.21

## 2012-02-11 HISTORY — DX: Unspecified protein-calorie malnutrition: E46

## 2012-02-11 HISTORY — DX: Disturbances of salivary secretion: K11.7

## 2012-02-11 NOTE — Progress Notes (Addendum)
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 He received 70 gray in 35 fractions. He had gaps in radiotherapy treatments due to Noncompliance.  He did not complete all planned chemotherapy  Narrative:  The patient returns today for routine follow-up.  Overall, doing relatively well. He does not drink alcohol but he does continue to smoke about half a pack per day. He does report some cold intolerance. He is gaining weight and eating well. Able to eat most foods but he has some sticking of food in his esophagus.    He has not seen his ENT doctor since the beginning of the year. He did not see medical oncology this summer, as scheduled. He does not have a Contractor. Occasionally, he has clumsiness of his hands. He also complains of a popping sensation in his jaw when he opens his mouth wide.  CT scan performed on December 9 of the neck is negative for mass or adenopathy. No evidence of recurrent carcinoma  ALLERGIES:   has no known allergies.  Meds: Current Outpatient Prescriptions  Medication Sig Dispense Refill  . pilocarpine (SALAGEN) 5 MG tablet Take 1 tablet (5 mg total) by mouth 3 (three) times daily.  90 tablet  3    Physical Findings: The patient is in no acute distress. Patient is alert and oriented.  weight is 157 lb 3.2 oz (71.305 kg). His blood pressure is 152/77 and his pulse is 56. His respiration is 20 and oxygen saturation is 98%. . General: Alert and oriented, in no acute distress HEENT: Head is normocephalic. Pupils are equally round and reactive to light. Extraocular movements are intact. Oropharynx is without concerning lesions. He is edentulous. Mild  xerostomia Neck: Neck is supple, no palpable cervical or supraclavicular lymphadenopathy. Extremities: No cyanosis or edema. Lymphatics: No concerning lymphadenopathy. Skin: No concerning lesions. Musculoskeletal: symmetric strength and muscle tone throughout. Good hand and arm strength bilaterally. Neurologic: Cranial nerves II through XII are grossly intact. No obvious focalities. Speech is fluent. Coordination is intact. No numbness in his upper extremities. Psychiatric: Judgment and insight are intact.    Lab Findings: Lab Results  Component Value Date   WBC 6.6 05/31/2011   HGB 12.8* 05/31/2011   HCT 37.8* 05/31/2011   MCV 89.1 05/31/2011   PLT 158 05/31/2011    CMP     Component Value Date/Time   NA 139 05/31/2011 1205   K 3.7 05/31/2011 1205   CL 107 05/31/2011 1205   CO2 22 05/31/2011 1205   GLUCOSE 96 05/31/2011 1205   BUN 17 05/31/2011 1205   CREATININE 1.47* 05/31/2011 1205   CALCIUM 9.6 05/31/2011 1205   PROT 6.8 04/07/2011 1145   ALBUMIN 4.2 04/07/2011 1145   AST 10 04/07/2011 1145   ALT 10 04/07/2011 1145   ALKPHOS 86 04/07/2011 1145   BILITOT 0.3 04/07/2011 1145     Radiographic Findings: Ct Soft Tissue Neck W Contrast  02/07/2012  *RADIOLOGY REPORT*  Clinical Data: Cancer with occult primary.  Post radiation and chemotherapy.  CT NECK WITH CONTRAST  Technique:  Multidetector CT imaging of the neck was performed with intravenous contrast.  Contrast: OMNIPAQUE IOHEXOL 300 MG/ML  SOLN  Comparison:  CT neck 08/10/2010.  PET CT 03/30/2011  Findings: Mild patchy airspace density in the lung apices bilaterally may be related to radiation change or bronchial infection.  Visualized paranasal sinuses are clear.  Visualized intracranial contents show no acute abnormality.  Posterior nasopharynx is normal.  Oropharynx and tongue is normal. No mass in the region of the tonsil.  Epiglottis and larynx are normal.  Thyroid is normal bilaterally.  Submandibular and parotid glands are normal bilaterally.   Previous identified cervical adenopathy has resolved.  No enlarged lymph nodes are present in the neck.  Disc degeneration and spondylosis at C6-7.  No bony lesion.  IMPRESSION: Negative for mass or adenopathy.  No evidence of recurrent carcinoma.  Patchy airspace disease in the upper lobes bilaterally question acute infection versus radiation change.   Original Report Authenticated By: Janeece Riggers, M.D.     Impression/Plan:    1) Head and Neck Cancer Status: No evidence of disease; I will refer the patient back to medical oncology and otolaryngology  2) Nutritional Status: - weight: Increasing  3) Risk Factors: The patient has been educated about risk factors including alcohol and tobacco abuse; they understand that avoidance of alcohol and tobacco is important to prevent recurrences as well as other cancers. However, he is currently smoking a half pack a day - I talked to him about cessation methods that he's not interested in attempting this right now  4) Swallowing: Overall, no significant dysphagia  5) Dental: He is edentulous; I told him to call Dr. Kristin Bruins if the popping in his jaw worsens. Until then, I told him to try not to open his mouth extremely wide  6) thyroid function: Due to cold intolerance, and history of radiotherapy, I ordered a TSH (he did not have this done is scheduled over the summer.) It is  elevated at 7.8.  If the patient cannot get a primary physician. to manage his hypothyroidism, I will start him on levothyroxine.  7) Social: No active social issues to address at this time  8) Follow-up in 6 months. The patient was encouraged to call with any issues or questions before then. _____________________________________   Lonie Peak, MD

## 2012-02-11 NOTE — Progress Notes (Signed)
Patient here for routine follow up head/neck cancer radiation completed 12/24/10.Occasional sore throat and popping of left mandible.Able to eat most foods but has some sticking of food in esophagus.Has not seen ENT doctor since first of year. Has continued clicking of dentures but nothing else Dr.Kulinski can do to help with this.

## 2012-02-11 NOTE — Addendum Note (Signed)
Encounter addended by: Lonie Peak, MD on: 02/11/2012  6:15 PM<BR>     Documentation filed: Notes Section

## 2012-02-14 ENCOUNTER — Telehealth: Payer: Self-pay | Admitting: *Deleted

## 2012-02-14 NOTE — Telephone Encounter (Signed)
Per dr.squire's notes requested that patient see dr.ha in two months

## 2012-02-16 ENCOUNTER — Encounter: Payer: Self-pay | Admitting: Radiation Oncology

## 2012-02-16 DIAGNOSIS — E039 Hypothyroidism, unspecified: Secondary | ICD-10-CM

## 2012-02-16 MED ORDER — LEVOTHYROXINE SODIUM 125 MCG PO TABS: 125.0000 ug | ORAL_TABLET | Freq: Every day | ORAL | Status: DC

## 2012-02-16 NOTE — Progress Notes (Signed)
I spoke with the patient by phone about his elevated TSH lab. I recommended starting levothyroxine. I told him to take this medication at the same time each day, and not with any other medications, to avoid interactions. He demonstrates understanding of these directions. I will recheck his TSH when I see him back in followup.  -----------------------------------  Lonie Peak, MD

## 2012-02-17 ENCOUNTER — Telehealth: Payer: Self-pay | Admitting: *Deleted

## 2012-02-17 NOTE — Telephone Encounter (Signed)
CALLED PATIENT TO INFORM OF LAB FOR 04-2012, LVM FOR A RETURN CALL

## 2012-02-22 ENCOUNTER — Other Ambulatory Visit: Payer: Self-pay | Admitting: Radiation Oncology

## 2012-04-13 ENCOUNTER — Other Ambulatory Visit: Payer: Self-pay | Admitting: Oncology

## 2012-04-13 DIAGNOSIS — C109 Malignant neoplasm of oropharynx, unspecified: Secondary | ICD-10-CM

## 2012-04-16 NOTE — Patient Instructions (Signed)
1.  Diagnosis:  History of head and neck cancer. 2.  Status:  Clinically in remission. 3.  Follow up: with Radiation Oncology, Ear/Nose/Throat, and with Medical Oncology at least once every 4 months.

## 2012-04-17 ENCOUNTER — Other Ambulatory Visit: Payer: Self-pay | Admitting: Lab

## 2012-04-17 ENCOUNTER — Ambulatory Visit: Payer: Self-pay | Admitting: Oncology

## 2012-04-17 NOTE — Progress Notes (Signed)
No show.  Letter sent to remind patient to reschedule.

## 2012-05-02 ENCOUNTER — Encounter (HOSPITAL_COMMUNITY): Payer: Self-pay | Admitting: Dentistry

## 2012-05-03 ENCOUNTER — Encounter (HOSPITAL_COMMUNITY): Payer: Self-pay | Admitting: Dentistry

## 2012-05-17 ENCOUNTER — Ambulatory Visit
Admission: RE | Admit: 2012-05-17 | Discharge: 2012-05-17 | Disposition: A | Discharge status: Home / Self Care | Payer: Self-pay | Source: Ambulatory Visit | Attending: Radiation Oncology | Admitting: Radiation Oncology

## 2012-05-17 LAB — CBC WITH DIFFERENTIAL/PLATELET
Basophils Absolute: 0 10*3/uL (ref 0.0–0.1)
Eosinophils Absolute: 0.1 10*3/uL (ref 0.0–0.5)
HCT: 38.2 % — ABNORMAL LOW (ref 38.4–49.9)
HGB: 12.9 g/dL — ABNORMAL LOW (ref 13.0–17.1)
MCV: 85.8 fL (ref 79.3–98.0)
MONO%: 6 % (ref 0.0–14.0)
NEUT#: 5.2 10*3/uL (ref 1.5–6.5)
NEUT%: 78.5 % — ABNORMAL HIGH (ref 39.0–75.0)
Platelets: 217 10*3/uL (ref 140–400)
RDW: 16 % — ABNORMAL HIGH (ref 11.0–14.6)

## 2012-05-17 LAB — COMPREHENSIVE METABOLIC PANEL (CC13)
Albumin: 3.2 g/dL — ABNORMAL LOW (ref 3.5–5.0)
Alkaline Phosphatase: 98 U/L (ref 40–150)
BUN: 12.3 mg/dL (ref 7.0–26.0)
Calcium: 9.1 mg/dL (ref 8.4–10.4)
Glucose: 113 mg/dl — ABNORMAL HIGH (ref 70–99)
Potassium: 4.2 mEq/L (ref 3.5–5.1)

## 2012-08-11 ENCOUNTER — Ambulatory Visit
Admission: RE | Admit: 2012-08-11 | Discharge: 2012-08-11 | Disposition: A | Discharge status: Home / Self Care | Payer: Medicaid Other | Source: Ambulatory Visit | Attending: Radiation Oncology | Admitting: Radiation Oncology

## 2012-08-11 ENCOUNTER — Encounter: Payer: Self-pay | Admitting: Radiation Oncology

## 2012-08-11 VITALS — BP 169/84 | HR 55 | Temp 97.9°F | Wt 164.0 lb

## 2012-08-11 DIAGNOSIS — C109 Malignant neoplasm of oropharynx, unspecified: Secondary | ICD-10-CM

## 2012-08-11 DIAGNOSIS — E039 Hypothyroidism, unspecified: Secondary | ICD-10-CM

## 2012-08-11 MED ORDER — LEVOTHYROXINE SODIUM 75 MCG PO TABS: 75.0000 ug | ORAL_TABLET | Freq: Every day | ORAL | Status: DC

## 2012-08-11 NOTE — Progress Notes (Signed)
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 He received 70 gray in 35 fractions. He had gaps in radiotherapy treatments due to Noncompliance. He did not complete all planned chemotherapy  Narrative:  The patient returns today for routine follow-up.  He reports that he stopped using Salagen due to sweating while taking this medication. His weight has increased. He reports that his swallowing is adequate but he does have trouble with food such as breads. But he is able to eat whereby state. He quit smoking tobacco. He is not chewing any tobacco he reports he is not using alcohol. He is able to quit without any pharmacology. He stopped using his Synthroid about a week ago because he felt that it was not helping. However his TSH did go down nicely when his labs were drawn in March. He sees a Emergency planning/management officer next week for evaluation of disability eligibility.      Of note he reports that when he turns his head a certain way to the lab while lying supine in bed, he gets a pain in his right neck and   this causes nausea. No vertigo associated with this.            ALLERGIES:  has No Known Allergies.  Meds: Current Outpatient Prescriptions  Medication Sig Dispense Refill  . levothyroxine (SYNTHROID) 125 MCG tablet Take 1 tablet (125 mcg total) by mouth daily.  30 tablet  3   No current facility-administered medications for this encounter.    Physical Findings: The patient is in no acute distress. Patient is alert and oriented.  weight is 164 lb (74.39 kg). His temperature is 97.9 F (36.6 C). His blood pressure is 169/84 and his pulse is 55. Marland Kitchen  Dentures removed. He is edentulous. No  visible or palpable lesions in his oropharynx. No palpable adenopathy in the cervical or supraclavicular regions.   Lab Findings: Lab Results  Component Value Date   WBC 6.7 05/17/2012   HGB 12.9* 05/17/2012   HCT 38.2* 05/17/2012   MCV 85.8 05/17/2012   PLT 217 05/17/2012    CMP     Component Value Date/Time   NA 142 05/17/2012 1100   NA 139 05/31/2011 1205   K 4.2 05/17/2012 1100   K 3.7 05/31/2011 1205   CL 110* 05/17/2012 1100   CL 107 05/31/2011 1205   CO2 23 05/17/2012 1100   CO2 22 05/31/2011 1205   GLUCOSE 113* 05/17/2012 1100   GLUCOSE 96 05/31/2011 1205   BUN 12.3 05/17/2012 1100   BUN 17 05/31/2011 1205   CREATININE 1.5* 05/17/2012 1100   CREATININE 1.47* 05/31/2011 1205   CALCIUM 9.1 05/17/2012 1100   CALCIUM 9.6 05/31/2011 1205   PROT 6.9 05/17/2012 1100   PROT 6.8 04/07/2011 1145   ALBUMIN 3.2* 05/17/2012 1100   ALBUMIN 4.2 04/07/2011 1145   AST 8 05/17/2012 1100   AST 10 04/07/2011 1145   ALT 10 05/17/2012 1100   ALT 10 04/07/2011 1145   ALKPHOS 98 05/17/2012 1100   ALKPHOS 86 04/07/2011 1145   BILITOT 0.34 05/17/2012 1100   BILITOT 0.3 04/07/2011 1145     Radiographic Findings: No results found.  Impression/Plan:    1) Head and Neck Cancer Status: No evidence of recurrent disease  2) Nutritional Status: No issues to address at this time  3) Risk Factors: The patient has been educated about risk factors including alcohol and tobacco abuse; they understand that avoidance of alcohol and tobacco is important to prevent recurrences as well as other cancers -he is now abstaining from alcohol and tobacco. Encouraged patient to continue abstinence  4) Swallowing: mild dysphasia with food such as breads, otherwise good function  5) Dental: No issues as he is edentulous  6) Energy: Patient reports continued fatigue, unchanged while on Synthroid. I encouraged him to resume Synthroid. Not sure it's necessary for him to be on quite as high doses he had been; we'll modify this dose to 75 mcg. We'll  not check his TSH today as it is probably going to be altered by the fact that he has not been taking his medication for a week. We'll check his TSH at his next followup  7) Social: No active social issues to address at this time  8) Other: Encouraged to continue followup with medical oncology and ENT. I am not sure of the etiology of his nausea/neck pain in bed. Advised him not to repeat this maneuver, if possible.  9) Follow-up in 4 months. The patient was encouraged to call with any issues or questions before then.  I spent 30 minutes minutes face to face with the patient and more than 50% of that time was spent in counseling and/or coordination of care. _____________________________________   Lonie Peak, MD

## 2012-08-11 NOTE — Progress Notes (Addendum)
Mr. [name] [name] here today for FU appt. S/P radiation which completed on 12/24/10.  He states that since the end of radiation, if he lies on his back and turns his head a certain way he actually becomes nauseated and "throw up".  He has reported this to Dr. Jenne Pane and he thinks it may be related to his prior radiation treatment. He states he has "a lot of" dry mouth which causes difficulty with swallowing and he has to take a drink with every swallow of food.   He is taking an unknown pill to help with dry mouth, but it only last for 5 minutes.  He constantly drinks fluids.    Upon inspection his mouth appears moist.  He has not taken his Synthroid 125 mcg  x 1 week.  He states he stopped it because he is always tired and he thought it was related to taking the synthroid, but he has not noted any difference.

## 2012-12-01 ENCOUNTER — Ambulatory Visit
Admission: RE | Admit: 2012-12-01 | Discharge: 2012-12-01 | Disposition: A | Discharge status: Home / Self Care | Payer: Self-pay | Source: Ambulatory Visit | Attending: Radiation Oncology | Admitting: Radiation Oncology

## 2012-12-01 DIAGNOSIS — C109 Malignant neoplasm of oropharynx, unspecified: Secondary | ICD-10-CM

## 2012-12-01 LAB — TSH CHCC: TSH: 2.706 m(IU)/L (ref 0.320–4.118)

## 2012-12-07 ENCOUNTER — Encounter: Payer: Self-pay | Admitting: Radiation Oncology

## 2012-12-08 ENCOUNTER — Ambulatory Visit
Admission: RE | Admit: 2012-12-08 | Discharge: 2012-12-08 | Disposition: A | Discharge status: Home / Self Care | Payer: Self-pay | Source: Ambulatory Visit | Attending: Radiation Oncology | Admitting: Radiation Oncology

## 2012-12-08 ENCOUNTER — Encounter: Payer: Self-pay | Admitting: Radiation Oncology

## 2012-12-08 VITALS — BP 135/95 | HR 64 | Temp 97.5°F | Resp 20 | Wt 166.0 lb

## 2012-12-08 DIAGNOSIS — C770 Secondary and unspecified malignant neoplasm of lymph nodes of head, face and neck: Secondary | ICD-10-CM

## 2012-12-08 DIAGNOSIS — C109 Malignant neoplasm of oropharynx, unspecified: Secondary | ICD-10-CM

## 2012-12-08 NOTE — Progress Notes (Addendum)
Follow  Up  Head neck radiation completed 12/24/10 70 Gy,  alert,oriented x3, has dry mouth, tabblets that is for producing saliva not sure of name, doesn't help with that, it makes him sweat, he takes that 3x day, takes biotene mouth rinse, has pain in left side neck > right side, pain is like "tonsilitis discomfort" states patient, food gets  Stuck  At end of sternum , he doesn't use his dentures to eat food, tops ones keep coming out, energy level comes and goes states patient, still takes synthroid daily, last Ct neck 02/17/12 ,TSH on 12/01/12=2.706, patient is havingDifficulty starting his stream, voiding, no dysuria, "Urine just don'y want to come out, has had kidney stones 3x" 3:21 PM

## 2012-12-08 NOTE — Progress Notes (Signed)
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 He received 70 gray in 35 fractions. He had gaps in radiotherapy treatments due to Noncompliance. He did not complete all planned chemotherapy   Narrative:  The patient returns today for routine follow-up.  Continues to have dry mouth, pilocarpine makes him sweat, he takes that 3x day, and it doesn't help saliva production.  He takes biotene mouth rinse, has pain in left side neck > right side, pain is like "tonsilitis discomfort" states patient, (although he points to cervical neck well below oropharynx. Food gets stuck at end of sternum - this is chronic - existed before start of ChRT, per patient. Energy level comes and goes - still takes synthroid daily.  Difficulty starting his stream, voiding; no dysuria, "Urine just don'y want to come out, has had kidney stones 3x."                             ALLERGIES:  has No Known Allergies.  Meds: Current Outpatient Prescriptions  Medication Sig Dispense Refill  . levothyroxine (SYNTHROID) 75 MCG tablet Take 1 tablet (75 mcg total) by mouth daily.  30 tablet  3  . PRESCRIPTION MEDICATION Take 1 tablet by mouth 3 (three) times daily.       No current facility-administered medications for this encounter.    Physical Findings: The patient is in no acute distress. Patient is alert and oriented.  weight is 166 lb (75.297 kg). His oral temperature is 97.5 F (36.4 C). His blood pressure is 135/95 and his pulse is 64. His respiration is 20. Marland Kitchen  Oropharynx - clear but dry mucosa, no palpable or visualized lesions.  Neck, has some lymphedema, modest, in anterior central  cervical neck.   There is a little asymmetry in the anterior neck tissues - with some fullness more evident on the left side - favor this to be benign, related to edema. No obvious adenopathy appreciated.  Lab Findings: Lab Results  Component Value Date   WBC 6.7 05/17/2012   HGB 12.9* 05/17/2012   HCT 38.2* 05/17/2012   MCV 85.8 05/17/2012   PLT 217 05/17/2012   Lab Results  Component Value Date   TSH 2.706 12/01/2012   CMP     Component Value Date/Time   NA 142 05/17/2012 1100   NA 139 05/31/2011 1205   K 4.2 05/17/2012 1100   K 3.7 05/31/2011 1205   CL 110* 05/17/2012 1100   CL 107 05/31/2011 1205   CO2 23 05/17/2012 1100   CO2 22 05/31/2011 1205   GLUCOSE 113* 05/17/2012 1100   GLUCOSE 96 05/31/2011 1205   BUN 12.3 05/17/2012 1100   BUN 17 05/31/2011 1205   CREATININE 1.5* 05/17/2012 1100   CREATININE 1.47* 05/31/2011 1205   CALCIUM 9.1 05/17/2012 1100   CALCIUM 9.6 05/31/2011 1205   PROT 6.9 05/17/2012 1100   PROT 6.8 04/07/2011 1145   ALBUMIN 3.2* 05/17/2012 1100   ALBUMIN 4.2 04/07/2011 1145   AST 8 05/17/2012 1100   AST 10 04/07/2011 1145   ALT 10 05/17/2012 1100   ALT 10 04/07/2011 1145   ALKPHOS  98 05/17/2012 1100   ALKPHOS 86 04/07/2011 1145   BILITOT 0.34 05/17/2012 1100   BILITOT 0.3 04/07/2011 1145     Radiographic Findings: No results found.  Impression/Plan:    1) Head and Neck Cancer Status: NED per physical exam. Last CT was 01/2012 with NED.  2) Nutritional Status: weight up 2 lb since June  3) Risk Factors: The patient has been educated about risk factors including alcohol and tobacco abuse; they understand that avoidance of alcohol and tobacco is important to prevent recurrences as well as other cancers  4) Swallowing: Stable and functional  5) Dental: edentulous  6) Energy: stable, TSH normal, on Synthroid  7) Social: No active social issues to address at this time  8) Xerostomia - not helped by pilocarpine.  I called pharmacist.  There is a small chance it could be  affecting his voiding issues (urinary) as there are cases in the literature of this for patient with history of nephrolithiasis.   More often, this causes incontinence instead.  Will d/c pilocarpine. It doesn't need tapering per pharmacist.  8) Other: Due to complaints of neck pain and slight asymmetry on neck exam (as well as some noncompliance during ChRT which could diminish efficacy of past treatments), will obtain CT of neck with contrast, next available  9) Follow-up in 6 months with TSH. The patient was encouraged to call with any issues or questions before then. My head and neck navigator will make sure patient has f/u with Dr. Jenne Pane as well.  Pt is not sure of next appointment. He requests ENT appointment in early Dec.  I spent 25 minutes minutes face to face with the patient and more than 50% of that time was spent in counseling and/or coordination of care. _____________________________________   Lonie Peak, MD

## 2012-12-10 ENCOUNTER — Encounter: Payer: Self-pay | Admitting: Radiation Oncology

## 2012-12-11 ENCOUNTER — Telehealth: Payer: Self-pay | Admitting: *Deleted

## 2012-12-11 NOTE — Telephone Encounter (Signed)
CALLED PATIENT TO INFORM OF TEST AND LABS, SPOKE WITH PATIENT AND HE IS AWARE OF THESE APPTS.

## 2012-12-14 ENCOUNTER — Ambulatory Visit
Admission: RE | Admit: 2012-12-14 | Discharge: 2012-12-14 | Disposition: A | Discharge status: Home / Self Care | Payer: Self-pay | Source: Ambulatory Visit | Attending: Radiation Oncology | Admitting: Radiation Oncology

## 2012-12-14 ENCOUNTER — Encounter (HOSPITAL_COMMUNITY): Payer: Self-pay

## 2012-12-14 ENCOUNTER — Ambulatory Visit (HOSPITAL_COMMUNITY)
Admission: RE | Admit: 2012-12-14 | Discharge: 2012-12-14 | Disposition: A | Discharge status: Home / Self Care | Payer: Self-pay | Source: Ambulatory Visit | Attending: Radiation Oncology | Admitting: Radiation Oncology

## 2012-12-14 DIAGNOSIS — C770 Secondary and unspecified malignant neoplasm of lymph nodes of head, face and neck: Secondary | ICD-10-CM

## 2012-12-14 DIAGNOSIS — C801 Malignant (primary) neoplasm, unspecified: Secondary | ICD-10-CM | POA: Insufficient documentation

## 2012-12-14 DIAGNOSIS — C50919 Malignant neoplasm of unspecified site of unspecified female breast: Secondary | ICD-10-CM | POA: Insufficient documentation

## 2012-12-14 DIAGNOSIS — C109 Malignant neoplasm of oropharynx, unspecified: Secondary | ICD-10-CM

## 2012-12-14 DIAGNOSIS — M47812 Spondylosis without myelopathy or radiculopathy, cervical region: Secondary | ICD-10-CM | POA: Insufficient documentation

## 2012-12-14 LAB — BUN AND CREATININE (CC13): Creatinine: 1.6 mg/dL — ABNORMAL HIGH (ref 0.7–1.3)

## 2012-12-14 MED ORDER — IOHEXOL 300 MG/ML  SOLN
80.0000 mL | Freq: Once | INTRAMUSCULAR | Status: AC | PRN
  Administered 2012-12-14: 80 mL via INTRAVENOUS

## 2013-02-24 ENCOUNTER — Other Ambulatory Visit: Payer: Self-pay | Admitting: Radiation Oncology

## 2013-06-15 ENCOUNTER — Ambulatory Visit
Admission: RE | Admit: 2013-06-15 | Discharge: 2013-06-15 | Disposition: A | Discharge status: Home / Self Care | Payer: Self-pay | Source: Ambulatory Visit | Attending: Radiation Oncology | Admitting: Radiation Oncology

## 2013-06-15 ENCOUNTER — Encounter: Payer: Self-pay | Admitting: Radiation Oncology

## 2013-06-15 VITALS — BP 190/110 | HR 56 | Temp 97.6°F | Ht 69.0 in | Wt 171.1 lb

## 2013-06-15 DIAGNOSIS — Z923 Personal history of irradiation: Secondary | ICD-10-CM | POA: Insufficient documentation

## 2013-06-15 DIAGNOSIS — E039 Hypothyroidism, unspecified: Secondary | ICD-10-CM | POA: Insufficient documentation

## 2013-06-15 DIAGNOSIS — C770 Secondary and unspecified malignant neoplasm of lymph nodes of head, face and neck: Secondary | ICD-10-CM | POA: Insufficient documentation

## 2013-06-15 DIAGNOSIS — F17200 Nicotine dependence, unspecified, uncomplicated: Secondary | ICD-10-CM | POA: Insufficient documentation

## 2013-06-15 DIAGNOSIS — R030 Elevated blood-pressure reading, without diagnosis of hypertension: Secondary | ICD-10-CM | POA: Insufficient documentation

## 2013-06-15 DIAGNOSIS — C109 Malignant neoplasm of oropharynx, unspecified: Secondary | ICD-10-CM | POA: Insufficient documentation

## 2013-06-15 DIAGNOSIS — K117 Disturbances of salivary secretion: Secondary | ICD-10-CM | POA: Insufficient documentation

## 2013-06-15 DIAGNOSIS — Z9221 Personal history of antineoplastic chemotherapy: Secondary | ICD-10-CM | POA: Insufficient documentation

## 2013-06-15 DIAGNOSIS — Z9119 Patient's noncompliance with other medical treatment and regimen: Secondary | ICD-10-CM | POA: Insufficient documentation

## 2013-06-15 DIAGNOSIS — R490 Dysphonia: Secondary | ICD-10-CM | POA: Insufficient documentation

## 2013-06-15 DIAGNOSIS — Z91199 Patient's noncompliance with other medical treatment and regimen due to unspecified reason: Secondary | ICD-10-CM | POA: Insufficient documentation

## 2013-06-15 LAB — TSH CHCC: TSH: 24.633 m(IU)/L — ABNORMAL HIGH (ref 0.320–4.118)

## 2013-06-15 MED ORDER — LARYNGOSCOPY SOLUTION RAD-ONC
15.0000 mL | Freq: Once | TOPICAL | Status: AC
  Administered 2013-06-15: 15 mL via TOPICAL
  Filled 2013-06-15: qty 15

## 2013-06-15 NOTE — Progress Notes (Signed)
[name] [name] here today for follow up s/p radiation therapy for head and neck cancer, unknown primary.  He reports that he has been hoarse for 2 weeks.  He also reports that he has intermittent pain in his posterior neck region causing dfficulty in holding his head in an upright position. He is able to eat "anything" he wants, but continues to have dry mouth which he manages by drinking soft drinks, because, as he reports, the sugar in the drinks decreases his dry mouth. BP elevated at 203/128, then 190/100 using 2 different dinamaps.  He reports that he has stopped taking his Synthroid, but was unable to report how long this has been.     1:16pm lab called to report that [name] [name]'s TSH value from today is 24.0.  Report to Dr. Isidore Moos.

## 2013-06-15 NOTE — Progress Notes (Signed)
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 He received 70 Gray in 35 fractions. He had gaps in radiotherapy treatments due to Noncompliance. He did not complete all planned chemotherapy   Narrative:  The patient returns today for routine follow-up.Here today for follow up s/p radiation therapy for head and neck cancer, unknown primary. He reports that he has been hoarse for 2 weeks. He also reports that he has intermittent pain in his posterior neck region causing dfficulty in holding his head in an upright position - this is episodic, ie when driving.  Pain in neck is chronic. He is able to eat "anything" he wants, but continues to have dry mouth which he manages by drinking soft drinks, because, as he reports, the sugar in the drinks decreases his dry mouth. BP elevated at 203/128, then 190/100 using 2 different dinamaps. He is not on BP meds. He is not taking his Synthroid at all.  He smoke 3-4 cigarettes daily.   Swallowing is good.                              ALLERGIES:  has No Known Allergies.  Meds: None  Physical Findings: The patient is in no acute distress. Patient is alert and oriented.  height is 5\' 9"  (1.753 m) and weight is 171 lb 1.6 oz (77.61 kg). His temperature is 97.6 F (36.4 C). His blood pressure is 190/110 and his pulse is 56. Marland Kitchen  General: Alert and oriented, in no acute distress HEENT: Head is normocephalic.  Extraocular movements are intact. Oropharynx is clear but dry. Edentulous. Neck: No palpable cervical or supraclavicular lymphadenopathy.   Procedure note: After anesthetizing the nasal cavity with anesthetic, the flexible  endoscope was introduced and passed through the nasal cavity. I appreciated no lesions in the pharynx or larynx to explain his hoarseness, but there was some pharyngeal thrush.  Lab Findings: Lab Results  Component Value Date   WBC 6.7 05/17/2012   HGB 12.9* 05/17/2012   HCT 38.2* 05/17/2012   MCV 85.8 05/17/2012   PLT 217 05/17/2012    Lab Results  Component Value Date   TSH 24.633* 06/15/2013    Radiographic Findings: No results found.  Impression/Plan:    1) Head and Neck Cancer Status: NED  2) Nutritional Status: - weight: slightly increased  3) Risk Factors: The patient has been educated about risk factors including alcohol and tobacco abuse; they understand that avoidance of alcohol and tobacco is important to prevent recurrences as well as other cancers. The patient continues to use tobacco. The patient was counseled to stop using tobacco and was offered pharmacotherapy for this. He would like a nicotine patch Rx. I will Rx this for him.  4) Swallowing: good   5) hoarseness - no tumor appreciated in laryngoscopy; but, some thrush is appreciated incidentally. Will Rx Diflucan.  6) Energy: no complaints, but he is not taking synthroid as Rx'd for hypothyroidism. After appt, results for TSH were elevated.  Will call pt to advise him to resume synthroid - he had been controlled at 28mcg when taking it. Will resume for  now at 9mcg  7)   Xerostomia: biotene recommended  8) Other: high BP. Urged pt to call PCP ASAP for appt to evaluation. Asymptomatic.  9) Follow-up in 2 months to gauge compliance, smoking cessation, TSH and other sx. The patient was encouraged to call with any issues or questions before then.  I spent 25 minutes face to face with the patient and more than 50% of that time was spent in counseling and/or coordination of care. _____________________________________   Eppie Gibson, MD

## 2013-06-18 ENCOUNTER — Encounter: Payer: Self-pay | Admitting: Radiation Oncology

## 2013-06-18 ENCOUNTER — Encounter: Payer: Self-pay | Admitting: Family Medicine

## 2013-06-18 ENCOUNTER — Ambulatory Visit (INDEPENDENT_AMBULATORY_CARE_PROVIDER_SITE_OTHER): Payer: Self-pay | Admitting: Family Medicine

## 2013-06-18 VITALS — BP 140/80 | HR 60 | Wt 168.0 lb

## 2013-06-18 DIAGNOSIS — I10 Essential (primary) hypertension: Secondary | ICD-10-CM

## 2013-06-18 MED ORDER — NICOTINE 7 MG/24HR TD PT24: MEDICATED_PATCH | TRANSDERMAL | Status: DC

## 2013-06-18 MED ORDER — NICOTINE 14 MG/24HR TD PT24: MEDICATED_PATCH | TRANSDERMAL | Status: DC

## 2013-06-18 MED ORDER — LEVOTHYROXINE SODIUM 50 MCG PO TABS: 50.0000 ug | ORAL_TABLET | Freq: Every day | ORAL | Status: DC

## 2013-06-18 MED ORDER — HYDROCHLOROTHIAZIDE 12.5 MG PO CAPS: 12.5000 mg | ORAL_CAPSULE | Freq: Every day | ORAL | Status: DC

## 2013-06-18 MED ORDER — FLUCONAZOLE 100 MG PO TABS: ORAL_TABLET | ORAL | Status: DC

## 2013-06-18 NOTE — Progress Notes (Deleted)
   Subjective:    Patient ID: [name] [name], male    DOB: 04/12/1955, 59 y.o.   MRN: [id number]  HPI    Review of Systems     Objective:   Physical Exam        Assessment & Plan:

## 2013-06-18 NOTE — Progress Notes (Signed)
Subjective:    Patient ID: [name] [name], male    DOB: 12-09-1955, 58 y.o.   MRN: [id number]  HPI [name] [name] is a 58 y.o. yo man who presents today with several concerns.  The patient presents today for evaluation of two recorded high blood pressure readings at the office of his ENT doctor. The patient believes that his high blood pressure is causing left arm pain and headaches.   The left arm pain has bothered the patient on and off for many years. In the last six months the pain has worsened and is now bothering the patient significantly more, occuring 2-3 times a week. The pain only occurs on the left and occasionally feels sharp and shooting and other times feels like tingling down his arm. The pain begins at the bottom of the patient's neck and then radiates down the length on his arm, stopping before his hand. The patient states that keeping his arm elevated worsens the pain and that laying down or dropping his arm by his side improves the pain somewhat. The patient denies any SOB, chest pain or diaphoresis with this pain. The patient does note some lightheadedness which sometimes accompanies the arm pain. This feeling occurs both with and without the arm pain. The patient also notes that his headaches occur somewhat concurrently with his left arm pain.   The patient's headache and dizziness have been occuring on and off for many years. The headache occurs on the left side of his head, near the temple twice a week. These headaches are not associated with light or sound sensitivity but are occasionally related to nausea and vomiting. The nausea and vomiting seem to be more related to the position of his head when he is on his back for a long period of time and not related to the left arm pain mentioned above. This positional dizziness has been evaluated by the patient's ENT previously and a cause was not found.  It is difficult to understand the origin or occurrence of the patient's  dizziness. The dizziness is occasionally associated with loss of orientation and a "feeling of floating out of my body." The patient denies any loss of consciousness, post-ictal state or loss of bowel or bladder continence. The patient has never tried drinking water or taking any medications to help relieve these headaches due to his aversion to medication status post CA treatment. The patient also admits to not drinking water because it is un-chlorinated and gives him dry mouth.   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 This has been evaluated by the patient's radiation oncologist and HENT with both CT and laryngoscopy. The patient was told that these symptoms are likely the result of the radiation treatment. Per the patient, there is nothing further that can be done about these symptoms at this time.   Review of Systems is negative except per HPI.     Objective:   Physical Exam  Constitutional: Patient is oriented to person, place, and time and well-developed, well-nourished, and in no distress. HENT: Head: Normocephalic and atraumatic. Neck strap muscles slightly asymmetric, larger strap muscle to the left. Mouth/Throat: Oropharynx is clear and moist. Dentures in place. Eyes: Conjunctivae and EOM are normal. Pupils are equal, round, and reactive to light.  Neck: Normal range of motion. Neck supple. No JVD present.  Cardiovascular: Normal rate, regular  rhythm. Exam reveals no murmurs, gallops and no friction rub.  Pulmonary/Chest: Effort normal and breath sounds normal. No respiratory distress. No wheezes or ronchi.   MSK: Biceps, Tricep and Patellar reflexes were all 2+ and equal bilaterally. Normal strength and sensation present in the upper extremity bilaterally. Pain illicited with elevation of the left arm while patient looks to the right for several seconds.     Assessment &  Plan:   Hypertension - Plan: hydrochlorothiazide (MICROZIDE) 12.5 MG capsule  Given the extensive workup provided by to the patient by his radiation oncologist and HENT specialist, will recommend following up here in1 month to further evaluate his concerns.  Arm Pain

## 2013-06-18 NOTE — Progress Notes (Deleted)
Subjective:    Patient ID: [name] [name], male    DOB: [birth year], 58 y.o.   MRN: [id number]  HPI Mr. [name] [name] is a 58 y.o. yo man who presents today with several concerns.  The patient presents today for evaluation of two recorded high blood pressure readings at the office of his [place]. The patient believes that his high blood pressure is causing left arm pain and headaches.   The left arm pain has bothered the patient on and off for many years. In the last six months the pain has worsened and is now bothering the patient significantly more, occuring 2-3 times a week. The pain only occurs on the left and occasionally feels sharp and shooting and other times feels like tingling down his arm. The pain begins at the bottom of the patient's neck and then radiates down the length on his arm, stopping before his hand. The patient states that keeping his arm elevated worsens the pain and that laying down or dropping his arm by his side improves the pain somewhat. The patient denies any SOB, chest pain or diaphoresis with this pain. The patient does note some lightheadedness which sometimes accompanies the arm pain. This feeling occurs both with and without the arm pain. The patient also notes that his headaches occur somewhat concurrently with his left arm pain.   The patient's headache and dizziness have been occuring on and off for many years. The headache occurs on the left side of his head, near the temple twice a week. These headaches are not associated with light or sound sensitivity but are occasionally related to nausea and vomiting. The nausea and vomiting seem to be more related to the position of his head when he is on his back for a long period of time and not related to the left arm pain mentioned above. This positional dizziness has been evaluated by the patient's ENT previously and a cause was not found.  It is difficult to understand the origin or occurrence of the patient's  dizziness. The dizziness is occasionally associated with loss of orientation and a "feeling of floating out of my body." The patient denies any loss of consciousness, post-ictal state or loss of bowel or bladder continence. The patient has never tried drinking water or taking any medications to help relieve these headaches due to his aversion to medication status post CA treatment. The patient also admits to not drinking water because it is un-chlorinated and gives him dry mouth.   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 This has been evaluated by the patient's radiation oncologist and HENT with both CT and laryngoscopy. The patient was told that these symptoms are likely the result of the radiation treatment. Per the patient, there is nothing further that can be done about these symptoms at this time.   Review of Systems is negative except per HPI.     Objective:   Physical Exam  Constitutional: Patient is oriented to person, place, and time and well-developed, well-nourished, and in no distress. HENT: Head: Normocephalic and atraumatic. Neck strap muscles slightly asymmetric, larger strap muscle to the left. Mouth/Throat: Oropharynx is clear and moist. Dentures in place. Eyes: Conjunctivae and EOM are normal. Pupils are equal, round, and reactive to light.  Neck: Normal range of motion. Neck supple. No JVD present.  Cardiovascular: Normal rate, regular  rhythm. Exam reveals no murmurs, gallops and no friction rub.  Pulmonary/Chest: Effort normal and breath sounds normal. No respiratory distress. No wheezes or ronchi.   MSK: Biceps, Tricep and Patellar reflexes were all 2+ and equal bilaterally. Normal strength and sensation present in the upper extremity bilaterally. Pain illicited with elevation of the left arm while patient looks to the right for several seconds.     Assessment &  Plan:   Hypertension - Plan: hydrochlorothiazide (MICROZIDE) 12.5 MG capsule  Given the extensive workup provided by to the patient by his radiation oncologist and HENT specialist, will recommend following up here in 4 months to further evaluate his concerns.  Arm Pain

## 2013-06-19 ENCOUNTER — Telehealth: Payer: Self-pay | Admitting: *Deleted

## 2013-06-19 NOTE — Telephone Encounter (Signed)
Called patient to inform that fu has been moved to June and he'll have a lab prior to this fu visit, spoke with patient and he is aware of this change.

## 2013-08-10 ENCOUNTER — Encounter: Payer: Self-pay | Admitting: Radiation Oncology

## 2013-08-10 ENCOUNTER — Ambulatory Visit
Admission: RE | Admit: 2013-08-10 | Discharge: 2013-08-10 | Disposition: A | Discharge status: Home / Self Care | Payer: Self-pay | Source: Ambulatory Visit | Attending: Radiation Oncology | Admitting: Radiation Oncology

## 2013-08-10 VITALS — BP 163/79 | HR 66 | Temp 97.8°F | Wt 169.3 lb

## 2013-08-10 DIAGNOSIS — E039 Hypothyroidism, unspecified: Secondary | ICD-10-CM

## 2013-08-10 DIAGNOSIS — C770 Secondary and unspecified malignant neoplasm of lymph nodes of head, face and neck: Secondary | ICD-10-CM

## 2013-08-10 LAB — TSH CHCC: TSH: 17.73 m(IU)/L — ABNORMAL HIGH (ref 0.320–4.118)

## 2013-08-10 MED ORDER — LEVOTHYROXINE SODIUM 100 MCG PO TABS: 100.0000 ug | ORAL_TABLET | Freq: Every day | ORAL | Status: DC

## 2013-08-10 NOTE — Progress Notes (Signed)
  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 He received 70 Gray in 35 fractions. He had gaps in radiotherapy treatments due to Noncompliance. He did not complete all planned chemotherapy    Narrative:  The patient returns today for routine follow-up.  Denies pain. Hoarseness improved on Diflucan.  Taking Synthroid as Rx'd. Not using nicotine patches, however.  No other new issues.  Last seen by ENT 01/2013.                              ALLERGIES:  has No Known Allergies.  Meds: Current Outpatient Prescriptions  Medication Sig Dispense Refill  . hydrochlorothiazide (MICROZIDE) 12.5 MG capsule Take 1 capsule (12.5 mg total) by mouth daily.  30 capsule  1  . levothyroxine (SYNTHROID, LEVOTHROID) 100 MCG tablet Take 1 tablet (100 mcg total) by mouth daily before breakfast.  30 tablet  4  . nicotine (NICODERM CQ) 14 mg/24hr patch apply 14 mg patch daily x 3 wk, then 7 mg patch daily x 2 wk  21 patch  0  . nicotine (NICODERM CQ) 7 mg/24hr patch apply 14 mg patch daily x 3 wk, then 7 mg patch daily x 2 wk  14 patch  0   No current facility-administered medications for this encounter.    Physical Findings: The patient is in no acute distress. Patient is alert and oriented.  weight is 169 lb 4.8 oz (76.794 kg). His temperature is 97.8 F (36.6 C). His blood pressure is 163/79 and his pulse is 66. His oxygen saturation is 99%. .  Oropharynx - clear.  No thrush or lesions. Neck - no cervical or SCV adenopathy. Not hoarse  Lab Findings: Lab Results  Component Value Date   WBC 6.7 05/17/2012   HGB 12.9* 05/17/2012   HCT 38.2* 05/17/2012   MCV 85.8 05/17/2012   PLT  217 05/17/2012    Lab Results  Component Value Date   TSH 17.730* 08/10/2013    Radiographic Findings: No results found.  Impression/Plan:    1) Head and Neck Cancer Status: NED   2) Nutritional Status: no active issues  3) Risk Factors: The patient has been educated about risk factors including alcohol and tobacco abuse; they understand that avoidance of alcohol and tobacco is important to prevent recurrences as well as other cancers. The patient continues to use tobacco. He is not using the nicotine patches that I prescribed him 2 months ago.  4) Swallowing: good   5) hoarseness - resolved with Diflucan that was given 2 mo ago for thrush  6) Energy: TSH improved in 69mcg synthroid but still elevated. Will increase to 120mcg and recheck in 90mo  7) Xerostomia: biotene recommended   8) Follow-up in 2 months with labs only (TSH).  F/u in  33mo. He hasn't seen Dr Redmond Baseman in a while. Will re-refer via pt navigator to see him in 55mo. The patient was encouraged to call with any issues or questions before then.   _____________________________________   Eppie Gibson, MD

## 2013-08-10 NOTE — Progress Notes (Signed)
Patient for routine follow up completion of radiation on 12/24/10 to head and neck.Denies pain.Has swallowing difficulties related to dry mouth.Offered suggestion of biotene products but he states it doesn't work for him so he keeps cup of fluids with him at all times.Last seen by ENT 01/2013.TSH drawn today.Informed we will call him if current dosage of synthroid 50 mcq needs to be adjusted.

## 2013-08-14 ENCOUNTER — Telehealth: Payer: Self-pay | Admitting: *Deleted

## 2013-08-14 NOTE — Telephone Encounter (Signed)
Spoke with Anderson Malta at [facility], requested per Dr. Pearlie Oyster guidance, a follow-up appt with Dr. Redmond Baseman in mid-September, i.e. 3 months s/p her 08/10/13 follow-up appt with patient.  Anderson Malta indicated she would contact patient.  Gayleen Orem, RN, BSN, [facility] Head & Neck Oncology Navigator [phone]

## 2013-08-15 ENCOUNTER — Other Ambulatory Visit: Payer: Self-pay | Admitting: Family Medicine

## 2013-10-06 ENCOUNTER — Other Ambulatory Visit: Payer: Self-pay | Admitting: Radiation Oncology

## 2013-10-12 ENCOUNTER — Ambulatory Visit: Payer: Self-pay | Attending: Radiation Oncology

## 2013-12-18 ENCOUNTER — Ambulatory Visit: Payer: Self-pay | Admitting: Radiation Oncology

## 2014-01-31 ENCOUNTER — Telehealth: Payer: Self-pay | Admitting: *Deleted

## 2014-01-31 NOTE — Telephone Encounter (Signed)
Called patient to ask question, lvm for a return cal

## 2014-02-01 ENCOUNTER — Ambulatory Visit
Admission: RE | Admit: 2014-02-01 | Discharge: 2014-02-01 | Disposition: A | Discharge status: Home / Self Care | Payer: Self-pay | Source: Ambulatory Visit | Attending: Radiation Oncology | Admitting: Radiation Oncology

## 2014-02-01 ENCOUNTER — Encounter: Payer: Self-pay | Admitting: Radiation Oncology

## 2014-02-01 VITALS — BP 141/95 | HR 57 | Temp 97.5°F | Ht 69.0 in | Wt 175.9 lb

## 2014-02-01 DIAGNOSIS — C801 Malignant (primary) neoplasm, unspecified: Secondary | ICD-10-CM

## 2014-02-01 DIAGNOSIS — C770 Secondary and unspecified malignant neoplasm of lymph nodes of head, face and neck: Secondary | ICD-10-CM

## 2014-02-01 DIAGNOSIS — E039 Hypothyroidism, unspecified: Secondary | ICD-10-CM

## 2014-02-01 DIAGNOSIS — E038 Other specified hypothyroidism: Secondary | ICD-10-CM

## 2014-02-01 LAB — TSH CHCC: TSH: 35.042 m[IU]/L — AB (ref 0.320–4.118)

## 2014-02-01 NOTE — Progress Notes (Signed)
[name] [name] here for reassessment today.  He reports dry mouth with the need to drink liquids frequently.   He denies any difficulty swallwoing, but notes "food sticking" in the lower esophageal region when eating which necessitates drinking large quantities of water.   Oral mucosa moist and intact.  Skin on neck soft and intact.  Vital stable

## 2014-02-01 NOTE — Progress Notes (Signed)
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 He received 70 Gray in 35 fractions. He had gaps in radiotherapy treatments due to Noncompliance. He did not complete all planned chemotherapy    Narrative:  The patient returns today for routine follow-up.  [name] [name] here for reassessment today.  He reports dry mouth with the need to drink liquids frequently.   He denies any difficulty swallwoing, but notes "food sticking" in the lower esophageal region when eating which necessitates drinking large quantities of water.   This is stable.  He is NOT taking his levothyroxine. He reports worsening short term memory. He is not seeing ENT due to financial issues. He is not seeing a PCP for same reason.   ALLERGIES:  has No Known Allergies.  Meds: Current Outpatient Prescriptions  Medication Sig Dispense Refill  . hydrochlorothiazide (MICROZIDE) 12.5 MG capsule TAKE ONE CAPSULE BY MOUTH EVERY DAY 30 capsule 1  . levothyroxine (SYNTHROID, LEVOTHROID) 100 MCG tablet Take 1 tablet (100 mcg total) by mouth daily before breakfast. 30 tablet 4  . nicotine (NICODERM CQ) 14 mg/24hr patch apply 14 mg patch daily x 3 wk, then 7 mg patch daily x 2 wk (Patient not taking: Reported on 02/01/2014) 21 patch 0  . nicotine (NICODERM CQ) 7 mg/24hr patch apply 14 mg patch daily x 3 wk, then 7 mg patch daily x 2 wk (Patient not taking: Reported on 02/01/2014) 14 patch 0   No current facility-administered medications for this encounter.    Physical Findings: The patient is in no acute distress. Patient is alert and oriented.  height is 5\' 9"   (1.753 m) and weight is 175 lb 14.4 oz (79.788 kg). His temperature is 97.5 F (36.4 C). His blood pressure is 141/95 and his pulse is 57. His oxygen saturation is 99%. .  Oropharynx - clear.  No thrush or lesions. Neck - no cervical or SCV adenopathy.   Lab Findings: Lab Results  Component Value Date   WBC 6.7 05/17/2012   HGB 12.9* 05/17/2012   HCT 38.2* 05/17/2012   MCV 85.8 05/17/2012   PLT 217 05/17/2012    Lab Results  Component Value Date   TSH 35.042* 02/01/2014    Radiographic Findings: No results found.  No results found.  Impression/Plan:    1) Head and Neck Cancer Status: NED   2) Nutritional Status: no active issues  3) Risk Factors: The patient has been educated about risk factors including alcohol and tobacco abuse; they understand that avoidance of alcohol and tobacco is important to prevent recurrences as well as other cancers. The patient continues to use tobacco.   4) Swallowing: stable  5) Short term memory decline: May be interfering with compliance on taking thyroid medication.  Unclear etiology.  Needs PCP and workup for this.  Needs hypothyroidism to be treated as well (could be contributing to cognition).  Pt has family h/o dementia. Will get social work and Gayleen Orem, Therapist, sports, our Head and Neck Oncology Navigator to help him with getting a PCP and establishing better compliance with medications  6) Energy: TSH worse today.  Not taking meds.  See #5.  Urged to start back on Levothyroxine 129mcg. Recheck TSH in 2 mo.  7) Xerostomia: biotene recommended   8) Follow-up in  1 year.  TSH in 2 mo.   Gayleen Orem, RN, our Head and Neck Oncology Navigator AND social worker to help with #5, #6, and loss of f/u with ENT.   25 minutes of  Face to face time spent on this visit, >50% on counseling and coordination of care. _____________________________________   Eppie Gibson, MD

## 2014-02-04 DIAGNOSIS — C801 Malignant (primary) neoplasm, unspecified: Secondary | ICD-10-CM

## 2014-02-04 DIAGNOSIS — C770 Secondary and unspecified malignant neoplasm of lymph nodes of head, face and neck: Secondary | ICD-10-CM | POA: Insufficient documentation

## 2014-02-04 MED ORDER — LEVOTHYROXINE SODIUM 100 MCG PO TABS
100.0000 ug | ORAL_TABLET | Freq: Every day | ORAL | Status: DC
Start: 2014-02-04 — End: 2014-10-09

## 2014-03-18 ENCOUNTER — Telehealth: Payer: Self-pay | Admitting: *Deleted

## 2014-03-18 NOTE — Telephone Encounter (Signed)
Spoke with [place] at [facility] ENT.  She confirmed that patient has not see Dr. Redmond Baseman in recent months.  I requested that she call patient to arrange f/u appt; she verbalized understanding.    Gayleen Orem, RN, BSN, [place] at [place] [phone]

## 2014-03-18 NOTE — Telephone Encounter (Signed)
Called patient to check on well-being and in f/u to his appt with Dr. Isidore Moos on 02/01/14.  LVM on CP, no answer at H.  Gayleen Orem, RN, BSN, [place] at [place] [phone]

## 2014-03-22 ENCOUNTER — Telehealth: Payer: Self-pay | Admitting: *Deleted

## 2014-03-22 NOTE — Telephone Encounter (Signed)
Called patient in f/u to early December appt with Dr. Isidore Moos.  LVM on mobile, asking that he return my call. Called home # x3, small child answered, adult did not come to phone x2; no answer on third attemp.  Gayleen Orem, RN, BSN, [place] at [place] [phone]

## 2014-10-09 ENCOUNTER — Other Ambulatory Visit: Payer: Self-pay | Admitting: Radiation Oncology

## 2015-01-31 ENCOUNTER — Telehealth: Payer: Self-pay

## 2015-01-31 ENCOUNTER — Ambulatory Visit
Admission: RE | Admit: 2015-01-31 | Discharge: 2015-01-31 | Disposition: A | Discharge status: Home / Self Care | Payer: Self-pay | Source: Ambulatory Visit | Attending: Radiation Oncology | Admitting: Radiation Oncology

## 2015-01-31 NOTE — Telephone Encounter (Signed)
I called [name] [name] and left a voice mail inquiring about his missed appointment today. I have asked him to call me to reschedule.

## 2015-04-20 ENCOUNTER — Emergency Department (HOSPITAL_COMMUNITY)
Admission: EM | Admit: 2015-04-20 | Discharge: 2015-04-20 | Disposition: A | Discharge status: Home / Self Care | Payer: Self-pay | Attending: Emergency Medicine | Admitting: Emergency Medicine

## 2015-04-20 ENCOUNTER — Encounter (HOSPITAL_COMMUNITY): Payer: Self-pay | Admitting: Nurse Practitioner

## 2015-04-20 DIAGNOSIS — X58XXXA Exposure to other specified factors, initial encounter: Secondary | ICD-10-CM

## 2015-04-20 DIAGNOSIS — R300 Dysuria: Secondary | ICD-10-CM | POA: Insufficient documentation

## 2015-04-20 DIAGNOSIS — I10 Essential (primary) hypertension: Secondary | ICD-10-CM | POA: Insufficient documentation

## 2015-04-20 DIAGNOSIS — F17210 Nicotine dependence, cigarettes, uncomplicated: Secondary | ICD-10-CM | POA: Insufficient documentation

## 2015-04-20 DIAGNOSIS — R109 Unspecified abdominal pain: Secondary | ICD-10-CM | POA: Insufficient documentation

## 2015-04-20 HISTORY — DX: Essential (primary) hypertension: I10

## 2015-04-20 LAB — COMPREHENSIVE METABOLIC PANEL
ALK PHOS: 92 U/L (ref 38–126)
ALT: 15 U/L — ABNORMAL LOW (ref 17–63)
ANION GAP: 11 (ref 5–15)
AST: 15 U/L (ref 15–41)
Albumin: 3.9 g/dL (ref 3.5–5.0)
BUN: 19 mg/dL (ref 6–20)
CO2: 18 mmol/L — AB (ref 22–32)
Calcium: 9.3 mg/dL (ref 8.9–10.3)
Chloride: 112 mmol/L — ABNORMAL HIGH (ref 101–111)
Creatinine, Ser: 1.82 mg/dL — ABNORMAL HIGH (ref 0.61–1.24)
GFR calc Af Amer: 45 mL/min — ABNORMAL LOW (ref >60)
GFR calc non Af Amer: 39 mL/min — ABNORMAL LOW (ref >60)
Glucose, Bld: 108 mg/dL — ABNORMAL HIGH (ref 65–99)
POTASSIUM: 4 mmol/L (ref 3.5–5.1)
SODIUM: 141 mmol/L (ref 135–145)
Total Bilirubin: 0.4 mg/dL (ref 0.3–1.2)
Total Protein: 7.6 g/dL (ref 6.5–8.1)

## 2015-04-20 LAB — CBC
HEMATOCRIT: 45.6 % (ref 39.0–52.0)
HEMOGLOBIN: 14.8 g/dL (ref 13.0–17.0)
MCH: 27.7 pg (ref 26.0–34.0)
MCHC: 32.5 g/dL (ref 30.0–36.0)
MCV: 85.2 fL (ref 78.0–100.0)
Platelets: 166 10*3/uL (ref 150–400)
RBC: 5.35 MIL/uL (ref 4.22–5.81)
RDW: 15.6 % — ABNORMAL HIGH (ref 11.5–15.5)
WBC: 12.2 10*3/uL — ABNORMAL HIGH (ref 4.0–10.5)

## 2015-04-20 NOTE — ED Notes (Addendum)
Woke this am with R sided abd pain, dysuyria, n/v, hot flashes. He is not having the pain currently. He states he has not had a bowel movment for 2 days which is not normal for him. He is A&Ox4, resp e/u

## 2015-06-22 ENCOUNTER — Emergency Department (HOSPITAL_COMMUNITY): Payer: Self-pay

## 2015-06-22 ENCOUNTER — Encounter (HOSPITAL_COMMUNITY): Payer: Self-pay | Admitting: Emergency Medicine

## 2015-06-22 ENCOUNTER — Inpatient Hospital Stay (HOSPITAL_COMMUNITY)
Admission: EM | Admit: 2015-06-22 | Discharge: 2015-06-25 | DRG: 683 | Disposition: A | Discharge status: Home / Self Care | Payer: Self-pay | Attending: Internal Medicine | Admitting: Internal Medicine

## 2015-06-22 DIAGNOSIS — I129 Hypertensive chronic kidney disease with stage 1 through stage 4 chronic kidney disease, or unspecified chronic kidney disease: Secondary | ICD-10-CM | POA: Diagnosis present

## 2015-06-22 DIAGNOSIS — K5792 Diverticulitis of intestine, part unspecified, without perforation or abscess without bleeding: Secondary | ICD-10-CM | POA: Diagnosis present

## 2015-06-22 DIAGNOSIS — Z87442 Personal history of urinary calculi: Secondary | ICD-10-CM

## 2015-06-22 DIAGNOSIS — K5732 Diverticulitis of large intestine without perforation or abscess without bleeding: Secondary | ICD-10-CM | POA: Diagnosis present

## 2015-06-22 DIAGNOSIS — N23 Unspecified renal colic: Secondary | ICD-10-CM

## 2015-06-22 DIAGNOSIS — N289 Disorder of kidney and ureter, unspecified: Secondary | ICD-10-CM

## 2015-06-22 DIAGNOSIS — N179 Acute kidney failure, unspecified: Principal | ICD-10-CM | POA: Diagnosis present

## 2015-06-22 DIAGNOSIS — N200 Calculus of kidney: Secondary | ICD-10-CM

## 2015-06-22 DIAGNOSIS — E876 Hypokalemia: Secondary | ICD-10-CM | POA: Diagnosis present

## 2015-06-22 DIAGNOSIS — R1032 Left lower quadrant pain: Secondary | ICD-10-CM

## 2015-06-22 DIAGNOSIS — C109 Malignant neoplasm of oropharynx, unspecified: Secondary | ICD-10-CM | POA: Diagnosis present

## 2015-06-22 DIAGNOSIS — Z91199 Patient's noncompliance with other medical treatment and regimen due to unspecified reason: Secondary | ICD-10-CM

## 2015-06-22 DIAGNOSIS — N1330 Unspecified hydronephrosis: Secondary | ICD-10-CM | POA: Diagnosis present

## 2015-06-22 DIAGNOSIS — Z9049 Acquired absence of other specified parts of digestive tract: Secondary | ICD-10-CM

## 2015-06-22 DIAGNOSIS — Z923 Personal history of irradiation: Secondary | ICD-10-CM

## 2015-06-22 DIAGNOSIS — Z9119 Patient's noncompliance with other medical treatment and regimen: Secondary | ICD-10-CM

## 2015-06-22 DIAGNOSIS — N132 Hydronephrosis with renal and ureteral calculous obstruction: Secondary | ICD-10-CM | POA: Diagnosis present

## 2015-06-22 DIAGNOSIS — E039 Hypothyroidism, unspecified: Secondary | ICD-10-CM

## 2015-06-22 DIAGNOSIS — N183 Chronic kidney disease, stage 3 (moderate): Secondary | ICD-10-CM | POA: Diagnosis present

## 2015-06-22 DIAGNOSIS — Z9221 Personal history of antineoplastic chemotherapy: Secondary | ICD-10-CM

## 2015-06-22 DIAGNOSIS — F17210 Nicotine dependence, cigarettes, uncomplicated: Secondary | ICD-10-CM | POA: Diagnosis present

## 2015-06-22 DIAGNOSIS — N189 Chronic kidney disease, unspecified: Secondary | ICD-10-CM

## 2015-06-22 LAB — CBC WITH DIFFERENTIAL/PLATELET
BASOS ABS: 0 10*3/uL (ref 0.0–0.1)
Basophils Relative: 0 %
EOS PCT: 0 %
Eosinophils Absolute: 0.1 10*3/uL (ref 0.0–0.7)
HCT: 43.2 % (ref 39.0–52.0)
Hemoglobin: 14.6 g/dL (ref 13.0–17.0)
LYMPHS PCT: 6 %
Lymphs Abs: 0.9 10*3/uL (ref 0.7–4.0)
MCH: 26.9 pg (ref 26.0–34.0)
MCHC: 33.8 g/dL (ref 30.0–36.0)
MCV: 79.6 fL (ref 78.0–100.0)
MONO ABS: 1 10*3/uL (ref 0.1–1.0)
Monocytes Relative: 7 %
Neutro Abs: 12.8 10*3/uL — ABNORMAL HIGH (ref 1.7–7.7)
Neutrophils Relative %: 87 %
PLATELETS: 184 10*3/uL (ref 150–400)
RBC: 5.43 MIL/uL (ref 4.22–5.81)
RDW: 15.3 % (ref 11.5–15.5)
WBC: 14.7 10*3/uL — ABNORMAL HIGH (ref 4.0–10.5)

## 2015-06-22 LAB — COMPREHENSIVE METABOLIC PANEL
ALT: 14 U/L — ABNORMAL LOW (ref 17–63)
AST: 16 U/L (ref 15–41)
Albumin: 3.8 g/dL (ref 3.5–5.0)
Alkaline Phosphatase: 90 U/L (ref 38–126)
Anion gap: 6 (ref 5–15)
BUN: 17 mg/dL (ref 6–20)
CHLORIDE: 110 mmol/L (ref 101–111)
CO2: 19 mmol/L — ABNORMAL LOW (ref 22–32)
Calcium: 8.7 mg/dL — ABNORMAL LOW (ref 8.9–10.3)
Creatinine, Ser: 2.27 mg/dL — ABNORMAL HIGH (ref 0.61–1.24)
GFR, EST AFRICAN AMERICAN: 34 mL/min — AB (ref >60)
GFR, EST NON AFRICAN AMERICAN: 30 mL/min — AB (ref >60)
Glucose, Bld: 98 mg/dL (ref 65–99)
POTASSIUM: 3.7 mmol/L (ref 3.5–5.1)
Sodium: 135 mmol/L (ref 135–145)
Total Bilirubin: 0.5 mg/dL (ref 0.3–1.2)
Total Protein: 7.2 g/dL (ref 6.5–8.1)

## 2015-06-22 LAB — I-STAT TROPONIN, ED: TROPONIN I, POC: 0 ng/mL (ref 0.00–0.08)

## 2015-06-22 LAB — URINALYSIS, ROUTINE W REFLEX MICROSCOPIC
Bilirubin Urine: NEGATIVE
Glucose, UA: NEGATIVE mg/dL
Ketones, ur: NEGATIVE mg/dL
LEUKOCYTES UA: NEGATIVE
NITRITE: NEGATIVE
PROTEIN: 30 mg/dL — AB
Specific Gravity, Urine: 1.021 (ref 1.005–1.030)
pH: 6 (ref 5.0–8.0)

## 2015-06-22 LAB — URINE MICROSCOPIC-ADD ON: SQUAMOUS EPITHELIAL / LPF: NONE SEEN

## 2015-06-22 LAB — LIPASE, BLOOD: LIPASE: 29 U/L (ref 11–51)

## 2015-06-22 LAB — TSH: TSH: 45.778 u[IU]/mL — AB (ref 0.350–4.500)

## 2015-06-22 MED ORDER — SODIUM CHLORIDE 0.9 % IV BOLUS (SEPSIS)
1000.0000 mL | Freq: Once | INTRAVENOUS | Status: AC
Start: 2015-06-22 — End: 2015-06-22
  Administered 2015-06-22: 1000 mL via INTRAVENOUS

## 2015-06-22 MED ORDER — METRONIDAZOLE IN NACL 5-0.79 MG/ML-% IV SOLN
500.0000 mg | Freq: Three times a day (TID) | INTRAVENOUS | Status: DC
  Administered 2015-06-23 – 2015-06-24 (×4): 500 mg via INTRAVENOUS
  Filled 2015-06-22 (×5): qty 100

## 2015-06-22 MED ORDER — KETOROLAC TROMETHAMINE 30 MG/ML IJ SOLN
30.0000 mg | Freq: Once | INTRAMUSCULAR | Status: AC
  Administered 2015-06-22: 30 mg via INTRAVENOUS
  Filled 2015-06-22: qty 1

## 2015-06-22 MED ORDER — METRONIDAZOLE IN NACL 5-0.79 MG/ML-% IV SOLN
500.0000 mg | Freq: Once | INTRAVENOUS | Status: AC
  Administered 2015-06-23: 500 mg via INTRAVENOUS
  Filled 2015-06-22: qty 100

## 2015-06-22 MED ORDER — ONDANSETRON HCL 4 MG/2ML IJ SOLN
4.0000 mg | Freq: Once | INTRAMUSCULAR | Status: AC
  Administered 2015-06-22: 4 mg via INTRAVENOUS
  Filled 2015-06-22: qty 2

## 2015-06-22 MED ORDER — CIPROFLOXACIN IN D5W 400 MG/200ML IV SOLN
400.0000 mg | Freq: Once | INTRAVENOUS | Status: AC
  Administered 2015-06-22: 400 mg via INTRAVENOUS
  Filled 2015-06-22: qty 200

## 2015-06-22 MED ORDER — FENTANYL CITRATE (PF) 100 MCG/2ML IJ SOLN
50.0000 ug | Freq: Once | INTRAMUSCULAR | Status: AC
  Administered 2015-06-22: 50 ug via INTRAVENOUS
  Filled 2015-06-22: qty 2

## 2015-06-22 MED ORDER — DIATRIZOATE MEGLUMINE & SODIUM 66-10 % PO SOLN
15.0000 mL | Freq: Once | ORAL | Status: AC
  Administered 2015-06-22: 15 mL via ORAL

## 2015-06-22 MED ORDER — HYDRALAZINE HCL 20 MG/ML IJ SOLN
10.0000 mg | INTRAMUSCULAR | Status: DC | PRN
  Administered 2015-06-22: 20 mg via INTRAVENOUS
  Filled 2015-06-22: qty 1

## 2015-06-22 MED ORDER — LEVOTHYROXINE SODIUM 100 MCG PO TABS
100.0000 ug | ORAL_TABLET | Freq: Every day | ORAL | Status: DC
  Administered 2015-06-23 – 2015-06-25 (×3): 100 ug via ORAL
  Filled 2015-06-22 (×4): qty 1

## 2015-06-22 MED ORDER — FENTANYL CITRATE (PF) 100 MCG/2ML IJ SOLN
25.0000 ug | Freq: Once | INTRAMUSCULAR | Status: AC
  Administered 2015-06-22: 25 ug via INTRAVENOUS
  Filled 2015-06-22: qty 2

## 2015-06-22 MED ORDER — CIPROFLOXACIN IN D5W 400 MG/200ML IV SOLN
400.0000 mg | Freq: Two times a day (BID) | INTRAVENOUS | Status: DC
  Administered 2015-06-23 (×2): 400 mg via INTRAVENOUS
  Filled 2015-06-22 (×3): qty 200

## 2015-06-22 MED ORDER — MORPHINE SULFATE (PF) 2 MG/ML IV SOLN
2.0000 mg | INTRAVENOUS | Status: DC | PRN
  Administered 2015-06-23: 4 mg via INTRAVENOUS
  Filled 2015-06-22: qty 2

## 2015-06-22 MED ORDER — SODIUM CHLORIDE 0.9 % IV SOLN
INTRAVENOUS | Status: DC
  Administered 2015-06-22 – 2015-06-23 (×3): via INTRAVENOUS

## 2015-06-22 NOTE — Progress Notes (Signed)
Pharmacy Antibiotic Note  [name] [name] is a 60 y.o. male admitted on 06/22/2015 with intra-abdominal infection.  Presented with reported symptoms of worsening abdominal pain, nausea/vomiting, shaking chills.  Pharmacy has been consulted for Cipro dosing.  Plan: Cipro '400mg'$  IV q12h Flagyl per MD Watch renal function  Weight: 172 lb 13.5 oz (78.4 kg)  Temp (24hrs), Avg:97.5 F (36.4 C), Min:97.4 F (36.3 C), Max:97.6 F (36.4 C)   Recent Labs Lab 06/22/15 2117  WBC 14.7*  CREATININE 2.27*    Estimated Creatinine Clearance: 34.6 mL/min (by C-G formula based on Cr of 2.27).    No Known Allergies  Antimicrobials this admission: 4/23 Cipro >>   4/23 Flagyl >>    Dose adjustments this admission:    Microbiology results:  Thank you for allowing pharmacy to be a part of this patient's care.  Everette Rank, PharmD 06/22/2015 11:22 PM

## 2015-06-22 NOTE — ED Notes (Signed)
Gave patient a urinal for urine sample.

## 2015-06-22 NOTE — ED Notes (Signed)
Pt c/o L sided abd pain onset over a week ago. Pt states he waiting in [facility] lobby last week but did not stay. Emesis x 3 today, +dizziness.  Pt states he is did not see follow up with Oncology for final appointment. Pt also states he has been out of his HTN medication and has not made appt with PCP.

## 2015-06-22 NOTE — H&P (Signed)
History and Physical    TABER SWEETSER EGB:[id number] DOB: [birth year] DOA: 06/22/2015  Referring MD/NP/PA: Dr. Lita Mains PCP: Wyatt Haste, MD Outpatient Specialists: None currently Patient coming from: Home  Chief Complaint: Abdominal pain  HPI: [name] [name] is a 60 y.o. male with medical history significant of oropharyngeal cancer, s/p chemo and radiation therapy which has left him with CKD baseline creatinine of about 1.8, missed his last follow up appointment with cancer center and isnt compliant with his synthroid.  Patient presents to the ED today with 2 day history of gradually worsening abdominal pain.  Pain is worse in the LLQ, associated with nausea and multiple episodes of vomiting.  Also states he has a "couple week" history of back tenderness as well that he attributes to "arthritis".  Does endorse chills.  ED Course: work up in ED shows R sided obstructing kidney stone, R sided hydronephrosis with perinephric stranding, AKI on CKD, and possibly diverticulitis as well by CT.  Review of Systems: As per HPI otherwise 10 point review of systems negative.    Past Medical History  Diagnosis Date  . Oropharyngeal cancer ([place]) 2012  . Renal insufficiency 2012    Secondary to Hx. of Cisplatin and Dhydrtion  . Status post radiation therapy 10/11/10 - 12/24/10    Bilateral Neck and Mucosa axis /  70 gray in 35 fractions  . Arthritis   . Full dentures     Fitting Per Dr. Enrique Sack  . Depression 06/12/11    Buproprion per Dr. Lamonte Sakai  . Smoking 06/12/11    1/2 pack/day  . Status post chemotherapy 10/30/10 - 11/09/10     2 doses of Q 3 week Cisplatin  . Xerostomia   . Protein calorie malnutrition ([place]) 05/31/11  . Hypertension     Past Surgical History  Procedure Laterality Date  . Appendectomy    . Biopsy tongue      TonsilandBaseoftongue  . Peg tube removal  04/13/11     reports that he has been smoking Cigarettes.  He has a 6.25 pack-year smoking history. He does not  have any smokeless tobacco history on file. He reports that he does not drink alcohol or use illicit drugs.  No Known Allergies  Family History  Problem Relation Age of Onset  . Cancer Father 36    colon  . Cancer Sister 69    leukemia     Prior to Admission medications   Medication Sig Start Date End Date Taking? Authorizing Provider  hydrochlorothiazide (MICROZIDE) 12.5 MG capsule TAKE ONE CAPSULE BY MOUTH EVERY DAY 08/15/13  Yes Denita Lung, MD  levothyroxine (SYNTHROID, LEVOTHROID) 100 MCG tablet TAKE 1 TABLET (100 MCG TOTAL) BY MOUTH DAILY BEFORE BREAKFAST. 10/09/14  Yes Eppie Gibson, MD  Pseudoeph-Doxylamine-DM-APAP (NYQUIL PO) Take 1 Dose by mouth daily as needed (cold symptoms).   Yes Historical Provider, MD    Physical Exam: Filed Vitals:   06/22/15 2024 06/22/15 2207 06/22/15 2321 06/22/15 2324  BP: 212/96 204/91  174/100  Pulse: 66 68  67  Temp: 97.4 F (36.3 C) 97.6 F (36.4 C)  97.6 F (36.4 C)  TempSrc: Oral Oral  Oral  Resp: '20 18  16  '$ Weight:   78.4 kg (172 lb 13.5 oz)   SpO2: 99% 100%  95%      Constitutional: NAD, calm, comfortable Filed Vitals:   06/22/15 2024 06/22/15 2207 06/22/15 2321 06/22/15 2324  BP: 212/96 204/91  174/100  Pulse: 66 68  67  Temp: 97.4 F (36.3 C) 97.6 F (36.4 C)  97.6 F (36.4 C)  TempSrc: Oral Oral  Oral  Resp: '20 18  16  '$ Weight:   78.4 kg (172 lb 13.5 oz)   SpO2: 99% 100%  95%   Eyes: PERRL, lids and conjunctivae normal ENMT: Mucous membranes are moist. Posterior pharynx clear of any exudate or lesions.Normal dentition.  Neck: normal, supple, no masses, no thyromegaly Respiratory: clear to auscultation bilaterally, no wheezing, no crackles. Normal respiratory effort. No accessory muscle use.  Cardiovascular: Regular rate and rhythm, no murmurs / rubs / gallops. No extremity edema. 2+ pedal pulses. No carotid bruits.  Abdomen: no tenderness, no masses palpated. No hepatosplenomegaly. Bowel sounds positive.    Musculoskeletal: no clubbing / cyanosis. No joint deformity upper and lower extremities. Good ROM, no contractures. Normal muscle tone.  Skin: no rashes, lesions, ulcers. No induration Neurologic: CN 2-12 grossly intact. Sensation intact, DTR normal. Strength 5/5 in all 4.  Psychiatric: Normal judgment and insight. Alert and oriented x 3. Normal mood.    Labs on Admission: I have personally reviewed following labs and imaging studies  CBC:  Recent Labs Lab 06/22/15 2117  WBC 14.7*  NEUTROABS 12.8*  HGB 14.6  HCT 43.2  MCV 79.6  PLT 379   Basic Metabolic Panel:  Recent Labs Lab 06/22/15 2117  NA 135  K 3.7  CL 110  CO2 19*  GLUCOSE 98  BUN 17  CREATININE 2.27*  CALCIUM 8.7*   GFR: Estimated Creatinine Clearance: 34.6 mL/min (by C-G formula based on Cr of 2.27). Liver Function Tests:  Recent Labs Lab 06/22/15 2117  AST 16  ALT 14*  ALKPHOS 90  BILITOT 0.5  PROT 7.2  ALBUMIN 3.8    Recent Labs Lab 06/22/15 2117  LIPASE 29   No results for input(s): AMMONIA in the last 168 hours. Coagulation Profile: No results for input(s): INR, PROTIME in the last 168 hours. Cardiac Enzymes: No results for input(s): CKTOTAL, CKMB, CKMBINDEX, TROPONINI in the last 168 hours. BNP (last 3 results) No results for input(s): PROBNP in the last 8760 hours. HbA1C: No results for input(s): HGBA1C in the last 72 hours. CBG: No results for input(s): GLUCAP in the last 168 hours. Lipid Profile: No results for input(s): CHOL, HDL, LDLCALC, TRIG, CHOLHDL, LDLDIRECT in the last 72 hours. Thyroid Function Tests:  Recent Labs  06/22/15 2119  TSH 45.778*   Anemia Panel: No results for input(s): VITAMINB12, FOLATE, FERRITIN, TIBC, IRON, RETICCTPCT in the last 72 hours. Urine analysis:    Component Value Date/Time   COLORURINE YELLOW 06/22/2015 2210   APPEARANCEUR CLEAR 06/22/2015 2210   LABSPEC 1.021 06/22/2015 2210   PHURINE 6.0 06/22/2015 2210   GLUCOSEU NEGATIVE  06/22/2015 2210   HGBUR TRACE* 06/22/2015 2210   BILIRUBINUR NEGATIVE 06/22/2015 2210   KETONESUR NEGATIVE 06/22/2015 2210   PROTEINUR 30* 06/22/2015 2210   NITRITE NEGATIVE 06/22/2015 2210   LEUKOCYTESUR NEGATIVE 06/22/2015 2210   Sepsis Labs: '@LABRCNTIP'$ (procalcitonin:4,lacticidven:4) )No results found for this or any previous visit (from the past 240 hour(s)).   Radiological Exams on Admission: Ct Abdomen Pelvis Wo Contrast  06/22/2015  CLINICAL DATA:  Acute onset of left-sided abdominal pain. Vomiting and dizziness. Initial encounter. EXAM: CT ABDOMEN AND PELVIS WITHOUT CONTRAST TECHNIQUE: Multidetector CT imaging of the abdomen and pelvis was performed following the standard protocol without IV contrast. COMPARISON:  PET/CT performed 03/30/2011 FINDINGS: The visualized lung bases are clear. Scattered coronary artery calcifications are seen. The liver  and spleen are unremarkable in appearance. The gallbladder is within normal limits. The pancreas is unremarkable. There is suggestion of a 1.2 cm adrenal adenoma at the left adrenal gland. The right adrenal gland is grossly unremarkable. There is mild-to-moderate left-sided hydronephrosis, with left-sided perinephric stranding and fluid. This reflects a large obstructing stone at the left renal pelvis, measuring 2.1 x 1.5 cm. Mild nonspecific right-sided perinephric stranding is seen. Nonobstructing right-sided renal stones measure up to 5 mm in size. A single smaller nonobstructing left renal stone is also seen. No free fluid is identified. The small bowel is unremarkable in appearance. The stomach is within normal limits. No acute vascular abnormalities are seen. Scattered calcification is noted along the abdominal aorta and its branches. There is ectasia of the infrarenal abdominal aorta to nearly 3.0 cm in transverse dimension and 2.7 cm in AP dimension, without significant aneurysmal dilatation. The patient is status post appendectomy. Scattered  diverticulosis is noted along the descending and sigmoid colon. Wall thickening is noted along the sigmoid colon, raising question for mild diverticulitis. The bladder is moderately distended and grossly unremarkable. The prostate is mildly enlarged, measuring 5.2 cm in transverse dimension. No inguinal lymphadenopathy is seen. No acute osseous abnormalities are identified. IMPRESSION: 1. Mild-to-moderate left-sided hydronephrosis, reflecting a large obstructing stone at the left renal pelvis, measuring 2.1 x 1.5 cm. 2. Scattered diverticulosis along the descending and sigmoid colon. Wall thickening at the sigmoid colon raises question for mild diverticulitis. 3. Nonobstructing bilateral renal stones, more prominent on the right, measuring up to 5 mm in size. 4. 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 5. Suggestion of 1.2 cm left adrenal adenoma. 6. Scattered coronary artery calcifications noted. 7. Mildly enlarged prostate noted. Electronically Signed   By: Garald Balding M.D.   On: 06/22/2015 22:39    EKG: Independently reviewed.  Assessment/Plan Principal Problem:   Acute kidney injury superimposed on chronic kidney disease ([place]) Active Problems:   Left nephrolithiasis   Hydronephrosis, left   Acute diverticulitis  AKI on CKD -  Creatinine 2.2 today up from 1.8 in March  Hypertensive today, think that this is most likely related to the obstructing L sided kidney stone and L sided hydronephrosis  Urology being called as it seems unlikely that this >2cm stone will spontaneously pass.  NPO  IVF to prevent dehydration  Acute diverticulitis -  Will put patient on cipro / flagyl  Appears very mild on CT scan  HTN - PRN hydralazine ordered  Hypothyroidism - not taking synthroid due to not having a script or PCP follow up, ordering synthroid  Oropharyngeal cancer  -  Apparently in remission without symptoms  However patient is very overdue for oncology follow up and no showed his last appointment in Dec   DVT prophylaxis: SCDs only as patient likely will need urologic surgery procedure for the kidney stone Code Status: Full Family Communication: No family in room Consults called: Urology called by EDP Admission status: Admit to inpatient   Etta Quill DO [facility] Pager [phone] from 7PM-7AM  If 7AM-7PM, please contact the day physician for the patient [email or web address] Password [name]  06/22/2015, 11:28 PM

## 2015-06-22 NOTE — ED Notes (Signed)
Patient transported to CT 

## 2015-06-22 NOTE — ED Provider Notes (Signed)
CSN: [id number]     Arrival date & time 06/22/15  2007 History   First MD Initiated Contact with Patient 06/22/15 2034     Chief Complaint  Patient presents with  . Abdominal Pain     (Consider location/radiation/quality/duration/timing/severity/associated sxs/prior Treatment) HPI Patient presents with 2 days of gradually worsening abdominal pain. Worse in the left lower quadrant. Associated with nausea and multiple episodes of vomiting. Patient states he's been unable to tolerate fluids or solids today. States he's been having shaking chills. Last bowel movement was yesterday. He has not been passing gas today. Admits to lightheadedness especially with standing. Denies room spinning sensation. No headache, chest pain, shortness of breath or cough. No neck pain or stiffness. Patient's had appendectomy and PEG tube surgery in the past. Past Medical History  Diagnosis Date  . Oropharyngeal cancer ([place]) 2012  . Renal insufficiency 2012    Secondary to Hx. of Cisplatin and Dhydrtion  . Status post radiation therapy 10/11/10 - 12/24/10    Bilateral Neck and Mucosa axis /  70 gray in 35 fractions  . Arthritis   . Full dentures     Fitting Per Dr. Enrique Sack  . Depression 06/12/11    Buproprion per Dr. Lamonte Sakai  . Smoking 06/12/11    1/2 pack/day  . Status post chemotherapy 10/30/10 - 11/09/10     2 doses of Q 3 week Cisplatin  . Xerostomia   . Protein calorie malnutrition ([place]) 05/31/11  . Hypertension    Past Surgical History  Procedure Laterality Date  . Appendectomy    . Biopsy tongue      TonsilandBaseoftongue  . Peg tube removal  04/13/11   Family History  Problem Relation Age of Onset  . Cancer Father 66    colon  . Cancer Sister 58    leukemia   Social History  Substance Use Topics  . Smoking status: Current Every Day Smoker -- 0.25 packs/day for 25 years    Types: Cigarettes  . Smokeless tobacco: None     Comment: Smoking 4-5 cigarettes daily on average  . Alcohol Use: No     Review of Systems  Constitutional: Positive for chills and fatigue. Negative for fever.  Respiratory: Negative for cough and shortness of breath.   Cardiovascular: Negative for chest pain and leg swelling.  Gastrointestinal: Positive for nausea, vomiting, abdominal pain, constipation and abdominal distention. Negative for diarrhea.  Genitourinary: Negative for dysuria, frequency, hematuria, flank pain and difficulty urinating.  Musculoskeletal: Negative for myalgias, back pain, neck pain and neck stiffness.  Skin: Negative for rash and wound.  Neurological: Positive for dizziness and light-headedness. Negative for syncope, weakness, numbness and headaches.  All other systems reviewed and are negative.     Allergies  Review of patient's allergies indicates no known allergies.  Home Medications   Prior to Admission medications   Medication Sig Start Date End Date Taking? Authorizing Provider  hydrochlorothiazide (MICROZIDE) 12.5 MG capsule TAKE ONE CAPSULE BY MOUTH EVERY DAY 08/15/13  Yes Denita Lung, MD  ciprofloxacin (CIPRO) 500 MG tablet Take 1 tablet (500 mg total) by mouth 2 (two) times daily. 06/25/15   Kelvin Cellar, MD  isosorbide mononitrate (IMDUR) 60 MG 24 hr tablet Take 1 tablet (60 mg total) by mouth daily. 06/25/15   Kelvin Cellar, MD  levothyroxine (SYNTHROID, LEVOTHROID) 100 MCG tablet Take 1 tablet (100 mcg total) by mouth daily before breakfast. 06/25/15   Kelvin Cellar, MD  metroNIDAZOLE (FLAGYL) 500 MG tablet Take 1  tablet (500 mg total) by mouth 3 (three) times daily. 06/25/15   Kelvin Cellar, MD   BP 139/63 mmHg  Pulse 63  Temp(Src) 97.5 F (36.4 C) (Oral)  Resp 16  Ht '5\' 9"'$  (1.753 m)  Wt 171 lb (77.565 kg)  BMI 25.24 kg/m2  SpO2 95% Physical Exam  Constitutional: He is oriented to person, place, and time. He appears well-developed and well-nourished. No distress.  HENT:  Head: Normocephalic and atraumatic.  Mouth/Throat: Oropharynx is clear and  moist. No oropharyngeal exudate.  Eyes: EOM are normal. Pupils are equal, round, and reactive to light.  Neck: Normal range of motion. Neck supple.  Cardiovascular: Normal rate and regular rhythm.   Pulmonary/Chest: Effort normal and breath sounds normal. No respiratory distress. He has no wheezes. He has no rales.  Abdominal: Soft. He exhibits distension (diffuse abdominal distention). There is tenderness (patient with left lower quadrant tenderness to palpation.). There is no rebound and no guarding.  High-pitched bowel sounds heard in the left side of the abdomen.  Musculoskeletal: Normal range of motion. He exhibits tenderness. He exhibits no edema.  Left flank pain with percussion. No midline thoracic or lumbar tenderness. No lower extremity swelling, asymmetry or tenderness. Distal pulses are equal and intact.  Neurological: He is alert and oriented to person, place, and time.  Patient is alert and oriented x3 with clear, goal oriented speech. Patient has 5/5 motor in all extremities. Sensation is intact to light touch. Bilateral finger-to-nose is normal with no signs of dysmetria. Patient has a normal gait and walks without assistance.  Skin: Skin is warm and dry. No rash noted. No erythema.  Psychiatric: He has a normal mood and affect. His behavior is normal.  Nursing note and vitals reviewed.   ED Course  Procedures (including critical care time) Labs Review Labs Reviewed  CBC WITH DIFFERENTIAL/PLATELET - Abnormal; Notable for the following:    WBC 14.7 (*)    Neutro Abs 12.8 (*)    All other components within normal limits  COMPREHENSIVE METABOLIC PANEL - Abnormal; Notable for the following:    CO2 19 (*)    Creatinine, Ser 2.27 (*)    Calcium 8.7 (*)    ALT 14 (*)    GFR calc non Af Amer 30 (*)    GFR calc Af Amer 34 (*)    All other components within normal limits  URINALYSIS, ROUTINE W REFLEX MICROSCOPIC (NOT AT [facility]) - Abnormal; Notable for the following:    Hgb urine  dipstick TRACE (*)    Protein, ur 30 (*)    All other components within normal limits  TSH - Abnormal; Notable for the following:    TSH 45.778 (*)    All other components within normal limits  URINE MICROSCOPIC-ADD ON - Abnormal; Notable for the following:    Bacteria, UA RARE (*)    All other components within normal limits  BASIC METABOLIC PANEL - Abnormal; Notable for the following:    CO2 19 (*)    Creatinine, Ser 1.86 (*)    Calcium 8.3 (*)    GFR calc non Af Amer 38 (*)    GFR calc Af Amer 44 (*)    All other components within normal limits  CBC - Abnormal; Notable for the following:    RDW 15.7 (*)    All other components within normal limits  BASIC METABOLIC PANEL - Abnormal; Notable for the following:    CO2 21 (*)    Creatinine, Ser  1.68 (*)    Calcium 8.7 (*)    GFR calc non Af Amer 43 (*)    GFR calc Af Amer 49 (*)    All other components within normal limits  CBC - Abnormal; Notable for the following:    Platelets 149 (*)    All other components within normal limits  LIPASE, BLOOD  T4, FREE  CBC  I-STAT TROPOININ, ED    Imaging Review No results found. I have personally reviewed and evaluated these images and lab results as part of my medical decision-making.   EKG Interpretation   Date/Time:  Sunday June 22 2015 21:07:09 EDT Ventricular Rate:  61 PR Interval:  160 QRS Duration: 102 QT Interval:  413 QTC Calculation: 416 R Axis:   67 Text Interpretation:  Sinus rhythm Atrial premature complexes Baseline  wander in lead(s) V5 since last tracing no significant change Confirmed by  WENTZ  MD, ELLIOTT ([id number]) on 06/23/2015 3:51:53 PM      MDM   Final diagnoses:  Left lower quadrant pain  Renal colic on left side  Renal insufficiency      Patient with likely sigmoid diverticulitis and left-sided nephrolithiasis. Discussed with hospitalist and we'll admit.  Julianne Rice, MD 06/26/15 [phone]

## 2015-06-23 DIAGNOSIS — C109 Malignant neoplasm of oropharynx, unspecified: Secondary | ICD-10-CM

## 2015-06-23 DIAGNOSIS — E039 Hypothyroidism, unspecified: Secondary | ICD-10-CM

## 2015-06-23 DIAGNOSIS — Z91199 Patient's noncompliance with other medical treatment and regimen due to unspecified reason: Secondary | ICD-10-CM

## 2015-06-23 DIAGNOSIS — Z9119 Patient's noncompliance with other medical treatment and regimen: Secondary | ICD-10-CM

## 2015-06-23 LAB — BASIC METABOLIC PANEL
ANION GAP: 12 (ref 5–15)
BUN: 17 mg/dL (ref 6–20)
CHLORIDE: 110 mmol/L (ref 101–111)
CO2: 19 mmol/L — AB (ref 22–32)
Calcium: 8.3 mg/dL — ABNORMAL LOW (ref 8.9–10.3)
Creatinine, Ser: 1.86 mg/dL — ABNORMAL HIGH (ref 0.61–1.24)
GFR calc Af Amer: 44 mL/min — ABNORMAL LOW (ref >60)
GFR calc non Af Amer: 38 mL/min — ABNORMAL LOW (ref >60)
Glucose, Bld: 93 mg/dL (ref 65–99)
POTASSIUM: 3.7 mmol/L (ref 3.5–5.1)
Sodium: 141 mmol/L (ref 135–145)

## 2015-06-23 LAB — CBC
HEMATOCRIT: 40.3 % (ref 39.0–52.0)
HEMOGLOBIN: 13.2 g/dL (ref 13.0–17.0)
MCH: 26.8 pg (ref 26.0–34.0)
MCHC: 32.8 g/dL (ref 30.0–36.0)
MCV: 81.7 fL (ref 78.0–100.0)
Platelets: 165 10*3/uL (ref 150–400)
RBC: 4.93 MIL/uL (ref 4.22–5.81)
RDW: 15.7 % — ABNORMAL HIGH (ref 11.5–15.5)
WBC: 9.7 10*3/uL (ref 4.0–10.5)

## 2015-06-23 LAB — T4, FREE: Free T4: 0.64 ng/dL (ref 0.61–1.12)

## 2015-06-23 MED ORDER — HYDROCHLOROTHIAZIDE 12.5 MG PO CAPS
12.5000 mg | ORAL_CAPSULE | Freq: Every day | ORAL | Status: DC
  Administered 2015-06-23 – 2015-06-24 (×2): 12.5 mg via ORAL
  Filled 2015-06-23 (×2): qty 1

## 2015-06-23 MED ORDER — BISACODYL 10 MG RE SUPP
10.0000 mg | Freq: Once | RECTAL | Status: AC
  Administered 2015-06-23: 10 mg via RECTAL
  Filled 2015-06-23: qty 1

## 2015-06-23 MED ORDER — PNEUMOCOCCAL VAC POLYVALENT 25 MCG/0.5ML IJ INJ
0.5000 mL | INJECTION | INTRAMUSCULAR | Status: AC
  Administered 2015-06-24: 0.5 mL via INTRAMUSCULAR
  Filled 2015-06-23 (×2): qty 0.5

## 2015-06-23 NOTE — Consult Note (Signed)
Urology Consult   Physician requesting consult: Jennette Kettle  Reason for consult: Left sided nephrolithiasis  History of Present Illness: [name] [name] is a 60 y.o. male with PMH significant for nephrolithiasis, oropharyngeal cancer s/p chemo and radiation, CKD (baseline Cr 1.8) and HTN who presented to the ED yesterday with c/o two days of LLQ pain, N/V, and chills.  He denied hematuria, dysuria, back/flank pain, and fevers.  CT scan shows 2 cm left UPJ obstructing stone with mild-moderate hydro, possible mild diverticulitis, non obstructing right renal stones, and suggestion of 1.2cm left adrenal adenoma. Cr on admission 2.27 and today is 1.86. WBC on admission 14.7 and today 9.7.  UA shows only trace hemoglobin and rare bacteria.  He was started on Cipro and Flagyl for diverticulitis.     Pt is currently resting and without complaint.  He denies F/C, HA, CP, SOB, N/V, diarrhea/constipation, abd/flank/back pain, dysuria, and hematuria.  He has passed two kidney stones with the last stone approx 6 years ago. He denies a history of voiding or storage urinary symptoms, hematuria, UTIs, GU malignancy/trauma/surgery.  He smokes 1/2 ppd x 40 years.  Denies ETOH and other drug use.    He states he has not eaten since last Friday and has not had anything to drink since early last night.   Past Medical History  Diagnosis Date  . Oropharyngeal cancer ([place]) 2012  . Renal insufficiency 2012    Secondary to Hx. of Cisplatin and Dhydrtion  . Status post radiation therapy 10/11/10 - 12/24/10    Bilateral Neck and Mucosa axis /  70 gray in 35 fractions  . Arthritis   . Full dentures     Fitting Per Dr. Enrique Sack  . Depression 06/12/11    Buproprion per Dr. Lamonte Sakai  . Smoking 06/12/11    1/2 pack/day  . Status post chemotherapy 10/30/10 - 11/09/10     2 doses of Q 3 week Cisplatin  . Xerostomia   . Protein calorie malnutrition ([place]) 05/31/11  . Hypertension     Past Surgical History  Procedure Laterality  Date  . Appendectomy    . Biopsy tongue      TonsilandBaseoftongue  . Peg tube removal  04/13/11    Current Hospital Medications:  Home Meds:    Medication List    ASK your doctor about these medications        hydrochlorothiazide 12.5 MG capsule  Commonly known as:  MICROZIDE  TAKE ONE CAPSULE BY MOUTH EVERY DAY     levothyroxine 100 MCG tablet  Commonly known as:  SYNTHROID, LEVOTHROID  TAKE 1 TABLET (100 MCG TOTAL) BY MOUTH DAILY BEFORE BREAKFAST.     NYQUIL PO  Take 1 Dose by mouth daily as needed (cold symptoms).        Scheduled Meds: . ciprofloxacin  400 mg Intravenous Q12H  . levothyroxine  100 mcg Oral QAC breakfast  . metronidazole  500 mg Intravenous Q8H  . [START ON 06/24/2015] pneumococcal 23 valent vaccine  0.5 mL Intramuscular Tomorrow-1000   Continuous Infusions: . sodium chloride 75 mL/hr at 06/22/15 2338   PRN Meds:.hydrALAZINE, morphine injection  Allergies: No Known Allergies  Family History  Problem Relation Age of Onset  . Cancer Father 61    colon  . Cancer Sister 37    leukemia    Social History:  reports that he has been smoking Cigarettes.  He has a 6.25 pack-year smoking history. He does not have any smokeless tobacco history on  file. He reports that he does not drink alcohol or use illicit drugs.  ROS: A complete review of systems was performed.  All systems are negative except for pertinent findings as noted.  Physical Exam:  Vital signs in last 24 hours: Temp:  [97.4 F (36.3 C)-98 F (36.7 C)] 97.9 F (36.6 C) (04/24 0333) Pulse Rate:  [66-101] 69 (04/24 0333) Resp:  [14-25] 23 (04/24 0333) BP: (112-212)/(59-100) 147/77 mmHg (04/24 0333) SpO2:  [95 %-100 %] 97 % (04/24 0333) Weight:  [77.565 kg (171 lb)-81.3 kg (179 lb 3.7 oz)] 77.565 kg (171 lb) (04/24 0930) Constitutional:  Alert and oriented, No acute distress Cardiovascular: Regular rate and rhythm Respiratory: Normal respiratory effort GI: Abdomen is soft, mildly  tender LLQ , nondistended, no abdominal masses GU: No CVA tenderness Lymphatic: No lymphadenopathy Neurologic: Grossly intact, no focal deficits Psychiatric: Normal mood and affect  Laboratory Data:   Recent Labs  06/22/15 2117 06/23/15 0951  WBC 14.7* 9.7  HGB 14.6 13.2  HCT 43.2 40.3  PLT 184 165     Recent Labs  06/22/15 2117 06/23/15 0951  NA 135 141  K 3.7 3.7  CL 110 110  GLUCOSE 98 93  BUN 17 17  CALCIUM 8.7* 8.3*  CREATININE 2.27* 1.86*     Results for orders placed or performed during the hospital encounter of 06/22/15 (from the past 24 hour(s))  CBC with Differential/Platelet     Status: Abnormal   Collection Time: 06/22/15  9:17 PM  Result Value Ref Range   WBC 14.7 (H) 4.0 - 10.5 K/uL   RBC 5.43 4.22 - 5.81 MIL/uL   Hemoglobin 14.6 13.0 - 17.0 g/dL   HCT 43.2 39.0 - 52.0 %   MCV 79.6 78.0 - 100.0 fL   MCH 26.9 26.0 - 34.0 pg   MCHC 33.8 30.0 - 36.0 g/dL   RDW 15.3 11.5 - 15.5 %   Platelets 184 150 - 400 K/uL   Neutrophils Relative % 87 %   Neutro Abs 12.8 (H) 1.7 - 7.7 K/uL   Lymphocytes Relative 6 %   Lymphs Abs 0.9 0.7 - 4.0 K/uL   Monocytes Relative 7 %   Monocytes Absolute 1.0 0.1 - 1.0 K/uL   Eosinophils Relative 0 %   Eosinophils Absolute 0.1 0.0 - 0.7 K/uL   Basophils Relative 0 %   Basophils Absolute 0.0 0.0 - 0.1 K/uL  Comprehensive metabolic panel     Status: Abnormal   Collection Time: 06/22/15  9:17 PM  Result Value Ref Range   Sodium 135 135 - 145 mmol/L   Potassium 3.7 3.5 - 5.1 mmol/L   Chloride 110 101 - 111 mmol/L   CO2 19 (L) 22 - 32 mmol/L   Glucose, Bld 98 65 - 99 mg/dL   BUN 17 6 - 20 mg/dL   Creatinine, Ser 2.27 (H) 0.61 - 1.24 mg/dL   Calcium 8.7 (L) 8.9 - 10.3 mg/dL   Total Protein 7.2 6.5 - 8.1 g/dL   Albumin 3.8 3.5 - 5.0 g/dL   AST 16 15 - 41 U/L   ALT 14 (L) 17 - 63 U/L   Alkaline Phosphatase 90 38 - 126 U/L   Total Bilirubin 0.5 0.3 - 1.2 mg/dL   GFR calc non Af Amer 30 (L) >60 mL/min   GFR calc Af Amer  34 (L) >60 mL/min   Anion gap 6 5 - 15  Lipase, blood     Status: None   Collection Time: 06/22/15  9:17 PM  Result Value Ref Range   Lipase 29 11 - 51 U/L  TSH     Status: Abnormal   Collection Time: 06/22/15  9:19 PM  Result Value Ref Range   TSH 45.778 (H) 0.350 - 4.500 uIU/mL  I-stat troponin, ED     Status: None   Collection Time: 06/22/15  9:25 PM  Result Value Ref Range   Troponin i, poc 0.00 0.00 - 0.08 ng/mL   Comment 3          Urinalysis, Routine w reflex microscopic (not at [facility])     Status: Abnormal   Collection Time: 06/22/15 10:10 PM  Result Value Ref Range   Color, Urine YELLOW YELLOW   APPearance CLEAR CLEAR   Specific Gravity, Urine 1.021 1.005 - 1.030   pH 6.0 5.0 - 8.0   Glucose, UA NEGATIVE NEGATIVE mg/dL   Hgb urine dipstick TRACE (A) NEGATIVE   Bilirubin Urine NEGATIVE NEGATIVE   Ketones, ur NEGATIVE NEGATIVE mg/dL   Protein, ur 30 (A) NEGATIVE mg/dL   Nitrite NEGATIVE NEGATIVE   Leukocytes, UA NEGATIVE NEGATIVE  Urine microscopic-add on     Status: Abnormal   Collection Time: 06/22/15 10:10 PM  Result Value Ref Range   Squamous Epithelial / LPF NONE SEEN NONE SEEN   WBC, UA 0-5 0 - 5 WBC/hpf   RBC / HPF 0-5 0 - 5 RBC/hpf   Bacteria, UA RARE (A) NONE SEEN  Basic metabolic panel     Status: Abnormal   Collection Time: 06/23/15  9:51 AM  Result Value Ref Range   Sodium 141 135 - 145 mmol/L   Potassium 3.7 3.5 - 5.1 mmol/L   Chloride 110 101 - 111 mmol/L   CO2 19 (L) 22 - 32 mmol/L   Glucose, Bld 93 65 - 99 mg/dL   BUN 17 6 - 20 mg/dL   Creatinine, Ser 1.86 (H) 0.61 - 1.24 mg/dL   Calcium 8.3 (L) 8.9 - 10.3 mg/dL   GFR calc non Af Amer 38 (L) >60 mL/min   GFR calc Af Amer 44 (L) >60 mL/min   Anion gap 12 5 - 15  CBC     Status: Abnormal   Collection Time: 06/23/15  9:51 AM  Result Value Ref Range   WBC 9.7 4.0 - 10.5 K/uL   RBC 4.93 4.22 - 5.81 MIL/uL   Hemoglobin 13.2 13.0 - 17.0 g/dL   HCT 40.3 39.0 - 52.0 %   MCV 81.7 78.0 - 100.0 fL    MCH 26.8 26.0 - 34.0 pg   MCHC 32.8 30.0 - 36.0 g/dL   RDW 15.7 (H) 11.5 - 15.5 %   Platelets 165 150 - 400 K/uL   No results found for this or any previous visit (from the past 240 hour(s)).  Renal Function:  Recent Labs  06/22/15 2117 06/23/15 0951  CREATININE 2.27* 1.86*   Estimated Creatinine Clearance: 42.2 mL/min (by C-G formula based on Cr of 1.86).  Radiologic Imaging: Ct Abdomen Pelvis Wo Contrast  06/22/2015  CLINICAL DATA:  Acute onset of left-sided abdominal pain. Vomiting and dizziness. Initial encounter. EXAM: CT ABDOMEN AND PELVIS WITHOUT CONTRAST TECHNIQUE: Multidetector CT imaging of the abdomen and pelvis was performed following the standard protocol without IV contrast. COMPARISON:  PET/CT performed 03/30/2011 FINDINGS: The visualized lung bases are clear. Scattered coronary artery calcifications are seen. The liver and spleen are unremarkable in appearance. The gallbladder is within normal limits. The pancreas is unremarkable. There is  suggestion of a 1.2 cm adrenal adenoma at the left adrenal gland. The right adrenal gland is grossly unremarkable. There is mild-to-moderate left-sided hydronephrosis, with left-sided perinephric stranding and fluid. This reflects a large obstructing stone at the left renal pelvis, measuring 2.1 x 1.5 cm. Mild nonspecific right-sided perinephric stranding is seen. Nonobstructing right-sided renal stones measure up to 5 mm in size. A single smaller nonobstructing left renal stone is also seen. No free fluid is identified. The small bowel is unremarkable in appearance. The stomach is within normal limits. No acute vascular abnormalities are seen. Scattered calcification is noted along the abdominal aorta and its branches. There is ectasia of the infrarenal abdominal aorta to nearly 3.0 cm in transverse dimension and 2.7 cm in AP dimension, without significant aneurysmal dilatation. The patient is status post appendectomy. Scattered diverticulosis  is noted along the descending and sigmoid colon. Wall thickening is noted along the sigmoid colon, raising question for mild diverticulitis. The bladder is moderately distended and grossly unremarkable. The prostate is mildly enlarged, measuring 5.2 cm in transverse dimension. No inguinal lymphadenopathy is seen. No acute osseous abnormalities are identified. IMPRESSION: 1. Mild-to-moderate left-sided hydronephrosis, reflecting a large obstructing stone at the left renal pelvis, measuring 2.1 x 1.5 cm. 2. Scattered diverticulosis along the descending and sigmoid colon. Wall thickening at the sigmoid colon raises question for mild diverticulitis. 3. Nonobstructing bilateral renal stones, more prominent on the right, measuring up to 5 mm in size. 4. 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 5. Suggestion of 1.2 cm left adrenal adenoma. 6. Scattered coronary artery calcifications noted. 7. Mildly enlarged prostate noted. Electronically Signed   By: Garald Balding M.D.   On: 06/22/2015 22:39     Impression/Recommendation  Left UPJ stone with mild-moderate hydronephrosis--unable to differentiate whether LLQ pain is due to diverticulitis vs stone (although less likely from stone due to its location with mild prox hydro and pt is not complaining of back or flank pain).  Pt is on Cipro and Flagyl to cover diverticulitis. He will need to have the stone treated likely via percutaneous nephrostomy; however, this can be scheduled electively as there is no evidence of UTI, Cr has returned to baseline, and his pain is controlled. Dr. Risa Grill will review chart and speak with pt later today.   DANCY, AMANDA 06/23/2015, 1:04 PM   Addendum: Agree with history and exam as above. Currently without any discomfort. No flank pain. Difficult to determine if his initial presenting symptoms were really due to  diverticulitis and/or the renal stone or both. There currently is no indication for any urgent intervention. The stone is large enough that it would be most suitable for a percutaneous nephrolithotomy versus ESWL or ureteroscopy. This would be best managed after his acute illness has resolved sometime in the next 2-4 weeks. If his clinical situation deteriorates and/or he develops problematic left flank pain or a decline in renal function and then a percutaneous nephrostomy tube can be placed by interventional radiology with the idea of going in subsequently for a percutaneous nephrolithotomy.

## 2015-06-23 NOTE — Progress Notes (Signed)
PROGRESS NOTE    [name] [name]  RJJ:[id number] DOB: October 09, 1955 DOA: 06/22/2015  PCP: Wyatt Haste, MD  Outpatient Specialists:  Dr Isidore Moos    Brief Narrative:  60 y/o male with head and neck cancer treated with radiation, hypothyroidism, HTN  not taking his medication admitted for 1 wk of LLQ pain nausea and vomiting. Found to have left 2 cm nephrolithiasis,hydronephrosis and possible diverticulitis. WBC count 14.7, AKI.   Assessment & Plan:   Principal Problem:   Acute kidney injury superimposed on chronic kidney disease 3 - does not appear pre-renal - may be due to nephrolithiasis- improving  Active Problems: Nephrolithiasis - urology will evaluate him  Possible diverticulitis - in sigmoid area- pain is in this region therefore treating with Cipro and Flagyl- start clear    Hypothyroidism - TSH 45.7- FT4 normal- has not taken his Synthroid in 1 month as he ran out- he states Dr Isidore Moos was prescribing it - have resumed    Oropharyngeal cancer - completed treatment but missed his last appt in 12/16- will have an appt made for him  HTN - will resume HCTZ on discharge   DVT prophylaxis: SCDs Code Status: full code Family Communication:  Disposition Plan: home when stable- will request appt with PCP as well   Consultants:   Urology  Procedures:   none  Antimicrobials:  Anti-infectives    Start     Dose/Rate Route Frequency Ordered Stop   06/23/15 1100  ciprofloxacin (CIPRO) IVPB 400 mg     400 mg 200 mL/hr over 60 Minutes Intravenous Every 12 hours 06/22/15 2326     06/23/15 0800  metroNIDAZOLE (FLAGYL) IVPB 500 mg     500 mg 100 mL/hr over 60 Minutes Intravenous Every 8 hours 06/22/15 2313     06/22/15 2300  ciprofloxacin (CIPRO) IVPB 400 mg     400 mg 200 mL/hr over 60 Minutes Intravenous  Once 06/22/15 2254 06/23/15 0030   06/22/15 2300  metroNIDAZOLE (FLAGYL) IVPB 500 mg     500 mg 100 mL/hr over 60 Minutes Intravenous  Once 06/22/15 2254  06/23/15 0130      Subjective: Pain in left lower abdomen. No further vomiting. No diarrhea. No fevers and chills.   Objective: Filed Vitals:   06/23/15 0100 06/23/15 0333 06/23/15 0930 06/23/15 1355  BP:  147/77  161/82  Pulse:  69  51  Temp:  97.9 F (36.6 C)  97.6 F (36.4 C)  TempSrc:  Oral  Oral  Resp:  23  20  Height:   '5\' 9"'$  (1.753 m)   Weight: 81.3 kg (179 lb 3.7 oz)  77.565 kg (171 lb)   SpO2:  97%  97%    Intake/Output Summary (Last 24 hours) at 06/23/15 1552 Last data filed at 06/23/15 1357  Gross per 24 hour  Intake      0 ml  Output    800 ml  Net   -800 ml   Filed Weights   06/22/15 2321 06/23/15 0100 06/23/15 0930  Weight: 78.4 kg (172 lb 13.5 oz) 81.3 kg (179 lb 3.7 oz) 77.565 kg (171 lb)    Examination: General exam: Appears comfortable  HEENT: PERRLA, oral mucosa moist, no sclera icterus or thrush Respiratory system: Clear to auscultation. Respiratory effort normal. Cardiovascular system: S1 & S2 heard, RRR.  No murmurs  Gastrointestinal system: Abdomen soft, tender in LLQ, nondistended. Normal bowel sound. No organomegaly Central nervous system: Alert and oriented. No focal neurological deficits. Extremities: No cyanosis,  clubbing or edema Skin: No rashes or ulcers Psychiatry:  Mood & affect appropriate.     Data Reviewed: I have personally reviewed following labs and imaging studies  CBC:  Recent Labs Lab 06/22/15 2117 06/23/15 0951  WBC 14.7* 9.7  NEUTROABS 12.8*  --   HGB 14.6 13.2  HCT 43.2 40.3  MCV 79.6 81.7  PLT 184 161   Basic Metabolic Panel:  Recent Labs Lab 06/22/15 2117 06/23/15 0951  NA 135 141  K 3.7 3.7  CL 110 110  CO2 19* 19*  GLUCOSE 98 93  BUN 17 17  CREATININE 2.27* 1.86*  CALCIUM 8.7* 8.3*   GFR: Estimated Creatinine Clearance: 42.2 mL/min (by C-G formula based on Cr of 1.86). Liver Function Tests:  Recent Labs Lab 06/22/15 2117  AST 16  ALT 14*  ALKPHOS 90  BILITOT 0.5  PROT 7.2  ALBUMIN  3.8    Recent Labs Lab 06/22/15 2117  LIPASE 29   No results for input(s): AMMONIA in the last 168 hours. Coagulation Profile: No results for input(s): INR, PROTIME in the last 168 hours. Cardiac Enzymes: No results for input(s): CKTOTAL, CKMB, CKMBINDEX, TROPONINI in the last 168 hours. BNP (last 3 results) No results for input(s): PROBNP in the last 8760 hours. HbA1C: No results for input(s): HGBA1C in the last 72 hours. CBG: No results for input(s): GLUCAP in the last 168 hours. Lipid Profile: No results for input(s): CHOL, HDL, LDLCALC, TRIG, CHOLHDL, LDLDIRECT in the last 72 hours. Thyroid Function Tests:  Recent Labs  06/22/15 2119 06/23/15 0951  TSH 45.778*  --   FREET4  --  0.64   Anemia Panel: No results for input(s): VITAMINB12, FOLATE, FERRITIN, TIBC, IRON, RETICCTPCT in the last 72 hours. Urine analysis:    Component Value Date/Time   COLORURINE YELLOW 06/22/2015 2210   APPEARANCEUR CLEAR 06/22/2015 2210   LABSPEC 1.021 06/22/2015 2210   PHURINE 6.0 06/22/2015 2210   GLUCOSEU NEGATIVE 06/22/2015 2210   HGBUR TRACE* 06/22/2015 2210   BILIRUBINUR NEGATIVE 06/22/2015 2210   KETONESUR NEGATIVE 06/22/2015 2210   PROTEINUR 30* 06/22/2015 2210   NITRITE NEGATIVE 06/22/2015 2210   LEUKOCYTESUR NEGATIVE 06/22/2015 2210   Sepsis Labs: '@LABRCNTIP'$ (procalcitonin:4,lacticidven:4)  )No results found for this or any previous visit (from the past 240 hour(s)).       Radiology Studies: Ct Abdomen Pelvis Wo Contrast  06/22/2015  CLINICAL DATA:  Acute onset of left-sided abdominal pain. Vomiting and dizziness. Initial encounter. EXAM: CT ABDOMEN AND PELVIS WITHOUT CONTRAST TECHNIQUE: Multidetector CT imaging of the abdomen and pelvis was performed following the standard protocol without IV contrast. COMPARISON:  PET/CT performed 03/30/2011 FINDINGS: The visualized lung bases are clear. Scattered coronary artery calcifications are seen. The liver and spleen are  unremarkable in appearance. The gallbladder is within normal limits. The pancreas is unremarkable. There is suggestion of a 1.2 cm adrenal adenoma at the left adrenal gland. The right adrenal gland is grossly unremarkable. There is mild-to-moderate left-sided hydronephrosis, with left-sided perinephric stranding and fluid. This reflects a large obstructing stone at the left renal pelvis, measuring 2.1 x 1.5 cm. Mild nonspecific right-sided perinephric stranding is seen. Nonobstructing right-sided renal stones measure up to 5 mm in size. A single smaller nonobstructing left renal stone is also seen. No free fluid is identified. The small bowel is unremarkable in appearance. The stomach is within normal limits. No acute vascular abnormalities are seen. Scattered calcification is noted along the abdominal aorta and its branches. There is ectasia  of the infrarenal abdominal aorta to nearly 3.0 cm in transverse dimension and 2.7 cm in AP dimension, without significant aneurysmal dilatation. The patient is status post appendectomy. Scattered diverticulosis is noted along the descending and sigmoid colon. Wall thickening is noted along the sigmoid colon, raising question for mild diverticulitis. The bladder is moderately distended and grossly unremarkable. The prostate is mildly enlarged, measuring 5.2 cm in transverse dimension. No inguinal lymphadenopathy is seen. No acute osseous abnormalities are identified. IMPRESSION: 1. Mild-to-moderate left-sided hydronephrosis, reflecting a large obstructing stone at the left renal pelvis, measuring 2.1 x 1.5 cm. 2. Scattered diverticulosis along the descending and sigmoid colon. Wall thickening at the sigmoid colon raises question for mild diverticulitis. 3. Nonobstructing bilateral renal stones, more prominent on the right, measuring up to 5 mm in size. 4. 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 5. Suggestion of 1.2 cm left adrenal adenoma. 6. Scattered coronary artery calcifications noted. 7. Mildly enlarged prostate noted. Electronically Signed   By: Garald Balding M.D.   On: 06/22/2015 22:39        Scheduled Meds: . ciprofloxacin  400 mg Intravenous Q12H  . levothyroxine  100 mcg Oral QAC breakfast  . metronidazole  500 mg Intravenous Q8H  . [START ON 06/24/2015] pneumococcal 23 valent vaccine  0.5 mL Intramuscular Tomorrow-1000   Continuous Infusions: . sodium chloride 125 mL/hr at 06/23/15 0943     LOS: 1 day    Time spent in minutes: 45    Benson, MD [facility] Pager: [email or web address] Password [email or web address] 06/23/2015, 3:52 PM

## 2015-06-24 LAB — CBC
HEMATOCRIT: 39.7 % (ref 39.0–52.0)
HEMOGLOBIN: 13 g/dL (ref 13.0–17.0)
MCH: 26.3 pg (ref 26.0–34.0)
MCHC: 32.7 g/dL (ref 30.0–36.0)
MCV: 80.2 fL (ref 78.0–100.0)
Platelets: 149 10*3/uL — ABNORMAL LOW (ref 150–400)
RBC: 4.95 MIL/uL (ref 4.22–5.81)
RDW: 15.3 % (ref 11.5–15.5)
WBC: 7.5 10*3/uL (ref 4.0–10.5)

## 2015-06-24 LAB — BASIC METABOLIC PANEL
ANION GAP: 11 (ref 5–15)
BUN: 17 mg/dL (ref 6–20)
CO2: 21 mmol/L — AB (ref 22–32)
Calcium: 8.7 mg/dL — ABNORMAL LOW (ref 8.9–10.3)
Chloride: 109 mmol/L (ref 101–111)
Creatinine, Ser: 1.68 mg/dL — ABNORMAL HIGH (ref 0.61–1.24)
GFR, EST AFRICAN AMERICAN: 49 mL/min — AB (ref >60)
GFR, EST NON AFRICAN AMERICAN: 43 mL/min — AB (ref >60)
Glucose, Bld: 87 mg/dL (ref 65–99)
Potassium: 3.5 mmol/L (ref 3.5–5.1)
Sodium: 141 mmol/L (ref 135–145)

## 2015-06-24 MED ORDER — CIPROFLOXACIN HCL 500 MG PO TABS
500.0000 mg | ORAL_TABLET | Freq: Two times a day (BID) | ORAL | Status: DC
  Administered 2015-06-24 – 2015-06-25 (×3): 500 mg via ORAL
  Filled 2015-06-24 (×5): qty 1

## 2015-06-24 MED ORDER — METRONIDAZOLE 500 MG PO TABS
500.0000 mg | ORAL_TABLET | Freq: Three times a day (TID) | ORAL | Status: DC
  Administered 2015-06-24 – 2015-06-25 (×3): 500 mg via ORAL
  Filled 2015-06-24 (×5): qty 1

## 2015-06-24 NOTE — Progress Notes (Signed)
PROGRESS NOTE    [name] [name]  TKP:[id number] DOB: 02/07/1956 DOA: 06/22/2015  PCP: Wyatt Haste, MD  Outpatient Specialists:  Dr Isidore Moos    Brief Narrative:  60 y/o male with head and neck cancer treated with radiation, hypothyroidism, HTN  not taking his medication admitted for 1 wk of LLQ pain nausea and vomiting. Found to have left 2 cm nephrolithiasis,hydronephrosis and possible diverticulitis. WBC count 14.7, AKI.   Assessment & Plan:   Principal Problem:   Acute kidney injury superimposed on chronic kidney disease 3 - does not appear pre-renal - may be due to nephrolithiasis- improved to baseline  Active Problems: Nephrolithiasis - urology doe not feel his pain is related to the nephrolithiasis- Dr Risa Grill recommending f/u in 2-4 wks for percutaneous nephrolithotomy versus ESWL or ureteroscopy - if he deteriorates, perc nephrostomy can be placed by IR  Possible diverticulitis - in sigmoid area- pain is in this region therefore treating with Cipro and Flagyl- started clear - this pain is improving with treatment- IV removed due to pain- will change to oral antibiotics, full liquids at lunch and soft diet at dinner - if stable, can go home tomorrow    Hypothyroidism - TSH 45.7- FT4 normal- has not taken his Synthroid in 1 month as he ran out- he states Dr Isidore Moos was prescribing it - have resumed  Mild Hypokalemia - follow    Oropharyngeal cancer - completed treatment but missed his last appt in 12/16- will have an appt made for him  HTN - will resume HCTZ on discharge   DVT prophylaxis: SCDs Code Status: full code Family Communication:  Disposition Plan: home when stable- will request appt with PCP as well   Consultants:   Urology  Procedures:   none  Antimicrobials:  Anti-infectives    Start     Dose/Rate Route Frequency Ordered Stop   06/24/15 1600  metroNIDAZOLE (FLAGYL) tablet 500 mg     500 mg Oral 3 times daily 06/24/15 1050     06/24/15 1100  ciprofloxacin (CIPRO) tablet 500 mg     500 mg Oral 2 times daily 06/24/15 1050     06/23/15 1100  ciprofloxacin (CIPRO) IVPB 400 mg  Status:  Discontinued     400 mg 200 mL/hr over 60 Minutes Intravenous Every 12 hours 06/22/15 2326 06/24/15 1053   06/23/15 0800  metroNIDAZOLE (FLAGYL) IVPB 500 mg  Status:  Discontinued     500 mg 100 mL/hr over 60 Minutes Intravenous Every 8 hours 06/22/15 2313 06/24/15 1053   06/22/15 2300  ciprofloxacin (CIPRO) IVPB 400 mg     400 mg 200 mL/hr over 60 Minutes Intravenous  Once 06/22/15 2254 06/23/15 0030   06/22/15 2300  metroNIDAZOLE (FLAGYL) IVPB 500 mg     500 mg 100 mL/hr over 60 Minutes Intravenous  Once 06/22/15 2254 06/23/15 0130      Subjective: Pain in left lower abdomen resolved. Clear liquids. No further vomiting. No diarrhea. No fevers and chills.   Objective: Filed Vitals:   06/23/15 0930 06/23/15 1355 06/23/15 2119 06/24/15 0538  BP:  161/82 176/80 143/76  Pulse:  51 69 50  Temp:  97.6 F (36.4 C) 97.9 F (36.6 C) 98.1 F (36.7 C)  TempSrc:  Oral Oral Oral  Resp:  '20 19 20  '$ Height: '5\' 9"'$  (1.753 m)     Weight: 77.565 kg (171 lb)     SpO2:  97% 98% 97%    Intake/Output Summary (Last 24 hours) at 06/24/15  [place] filed at 06/24/15 1012  Gross per 24 hour  Intake 3503.34 ml  Output   2300 ml  Net 1203.34 ml   Filed Weights   06/22/15 2321 06/23/15 0100 06/23/15 0930  Weight: 78.4 kg (172 lb 13.5 oz) 81.3 kg (179 lb 3.7 oz) 77.565 kg (171 lb)    Examination: General exam: Appears comfortable  HEENT: PERRLA, oral mucosa moist, no sclera icterus or thrush Respiratory system: Clear to auscultation. Respiratory effort normal. Cardiovascular system: S1 & S2 heard, RRR.  No murmurs  Gastrointestinal system: Abdomen soft, mildly tender in LLQ, nondistended. Normal bowel sound. No organomegaly Central nervous system: Alert and oriented. No focal neurological deficits. Extremities: No cyanosis, clubbing  or edema Skin: No rashes or ulcers Psychiatry:  Mood & affect appropriate.     Data Reviewed: I have personally reviewed following labs and imaging studies  CBC:  Recent Labs Lab 06/22/15 2117 06/23/15 0951 06/24/15 0425  WBC 14.7* 9.7 7.5  NEUTROABS 12.8*  --   --   HGB 14.6 13.2 13.0  HCT 43.2 40.3 39.7  MCV 79.6 81.7 80.2  PLT 184 165 025*   Basic Metabolic Panel:  Recent Labs Lab 06/22/15 2117 06/23/15 0951 06/24/15 0425  NA 135 141 141  K 3.7 3.7 3.5  CL 110 110 109  CO2 19* 19* 21*  GLUCOSE 98 93 87  BUN '17 17 17  '$ CREATININE 2.27* 1.86* 1.68*  CALCIUM 8.7* 8.3* 8.7*   GFR: Estimated Creatinine Clearance: 46.8 mL/min (by C-G formula based on Cr of 1.68). Liver Function Tests:  Recent Labs Lab 06/22/15 2117  AST 16  ALT 14*  ALKPHOS 90  BILITOT 0.5  PROT 7.2  ALBUMIN 3.8    Recent Labs Lab 06/22/15 2117  LIPASE 29   No results for input(s): AMMONIA in the last 168 hours. Coagulation Profile: No results for input(s): INR, PROTIME in the last 168 hours. Cardiac Enzymes: No results for input(s): CKTOTAL, CKMB, CKMBINDEX, TROPONINI in the last 168 hours. BNP (last 3 results) No results for input(s): PROBNP in the last 8760 hours. HbA1C: No results for input(s): HGBA1C in the last 72 hours. CBG: No results for input(s): GLUCAP in the last 168 hours. Lipid Profile: No results for input(s): CHOL, HDL, LDLCALC, TRIG, CHOLHDL, LDLDIRECT in the last 72 hours. Thyroid Function Tests:  Recent Labs  06/22/15 2119 06/23/15 0951  TSH 45.778*  --   FREET4  --  0.64   Anemia Panel: No results for input(s): VITAMINB12, FOLATE, FERRITIN, TIBC, IRON, RETICCTPCT in the last 72 hours. Urine analysis:    Component Value Date/Time   COLORURINE YELLOW 06/22/2015 2210   APPEARANCEUR CLEAR 06/22/2015 2210   LABSPEC 1.021 06/22/2015 2210   PHURINE 6.0 06/22/2015 2210   GLUCOSEU NEGATIVE 06/22/2015 2210   HGBUR TRACE* 06/22/2015 2210   BILIRUBINUR  NEGATIVE 06/22/2015 2210   KETONESUR NEGATIVE 06/22/2015 2210   PROTEINUR 30* 06/22/2015 2210   NITRITE NEGATIVE 06/22/2015 2210   LEUKOCYTESUR NEGATIVE 06/22/2015 2210   Sepsis Labs: '@LABRCNTIP'$ (procalcitonin:4,lacticidven:4)  )No results found for this or any previous visit (from the past 240 hour(s)).       Radiology Studies: Ct Abdomen Pelvis Wo Contrast  06/22/2015  CLINICAL DATA:  Acute onset of left-sided abdominal pain. Vomiting and dizziness. Initial encounter. EXAM: CT ABDOMEN AND PELVIS WITHOUT CONTRAST TECHNIQUE: Multidetector CT imaging of the abdomen and pelvis was performed following the standard protocol without IV contrast. COMPARISON:  PET/CT performed 03/30/2011 FINDINGS: The visualized lung  bases are clear. Scattered coronary artery calcifications are seen. The liver and spleen are unremarkable in appearance. The gallbladder is within normal limits. The pancreas is unremarkable. There is suggestion of a 1.2 cm adrenal adenoma at the left adrenal gland. The right adrenal gland is grossly unremarkable. There is mild-to-moderate left-sided hydronephrosis, with left-sided perinephric stranding and fluid. This reflects a large obstructing stone at the left renal pelvis, measuring 2.1 x 1.5 cm. Mild nonspecific right-sided perinephric stranding is seen. Nonobstructing right-sided renal stones measure up to 5 mm in size. A single smaller nonobstructing left renal stone is also seen. No free fluid is identified. The small bowel is unremarkable in appearance. The stomach is within normal limits. No acute vascular abnormalities are seen. Scattered calcification is noted along the abdominal aorta and its branches. There is ectasia of the infrarenal abdominal aorta to nearly 3.0 cm in transverse dimension and 2.7 cm in AP dimension, without significant aneurysmal dilatation. The patient is status post appendectomy. Scattered diverticulosis is noted along the descending and sigmoid colon. Wall  thickening is noted along the sigmoid colon, raising question for mild diverticulitis. The bladder is moderately distended and grossly unremarkable. The prostate is mildly enlarged, measuring 5.2 cm in transverse dimension. No inguinal lymphadenopathy is seen. No acute osseous abnormalities are identified. IMPRESSION: 1. Mild-to-moderate left-sided hydronephrosis, reflecting a large obstructing stone at the left renal pelvis, measuring 2.1 x 1.5 cm. 2. Scattered diverticulosis along the descending and sigmoid colon. Wall thickening at the sigmoid colon raises question for mild diverticulitis. 3. Nonobstructing bilateral renal stones, more prominent on the right, measuring up to 5 mm in size. 4. 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 5. Suggestion of 1.2 cm left adrenal adenoma. 6. Scattered coronary artery calcifications noted. 7. Mildly enlarged prostate noted. Electronically Signed   By: Garald Balding M.D.   On: 06/22/2015 22:39        Scheduled Meds: . ciprofloxacin  500 mg Oral BID  . levothyroxine  100 mcg Oral QAC breakfast  . metroNIDAZOLE  500 mg Oral TID   Continuous Infusions:     LOS: 2 days    Time spent in minutes: 24    [place], MD [facility] Pager: [email or web address] Password [email or web address] 06/24/2015, 1:17 PM

## 2015-06-25 DIAGNOSIS — N189 Chronic kidney disease, unspecified: Secondary | ICD-10-CM

## 2015-06-25 DIAGNOSIS — K5792 Diverticulitis of intestine, part unspecified, without perforation or abscess without bleeding: Secondary | ICD-10-CM

## 2015-06-25 DIAGNOSIS — N179 Acute kidney failure, unspecified: Principal | ICD-10-CM

## 2015-06-25 DIAGNOSIS — N1330 Unspecified hydronephrosis: Secondary | ICD-10-CM

## 2015-06-25 LAB — CBC
HCT: 41.9 % (ref 39.0–52.0)
Hemoglobin: 14 g/dL (ref 13.0–17.0)
MCH: 27.1 pg (ref 26.0–34.0)
MCHC: 33.4 g/dL (ref 30.0–36.0)
MCV: 81.2 fL (ref 78.0–100.0)
PLATELETS: 163 10*3/uL (ref 150–400)
RBC: 5.16 MIL/uL (ref 4.22–5.81)
RDW: 15.3 % (ref 11.5–15.5)
WBC: 8.5 10*3/uL (ref 4.0–10.5)

## 2015-06-25 MED ORDER — LEVOTHYROXINE SODIUM 100 MCG PO TABS: 100.0000 ug | ORAL_TABLET | Freq: Every day | ORAL | Status: DC

## 2015-06-25 MED ORDER — CIPROFLOXACIN HCL 500 MG PO TABS: 500.0000 mg | ORAL_TABLET | Freq: Two times a day (BID) | ORAL | Status: DC

## 2015-06-25 MED ORDER — ISOSORBIDE MONONITRATE ER 60 MG PO TB24: 60.0000 mg | ORAL_TABLET | Freq: Every day | ORAL | Status: DC

## 2015-06-25 MED ORDER — METRONIDAZOLE 500 MG PO TABS: 500.0000 mg | ORAL_TABLET | Freq: Three times a day (TID) | ORAL | Status: DC

## 2015-06-25 MED ORDER — ISOSORBIDE MONONITRATE ER 60 MG PO TB24
60.0000 mg | ORAL_TABLET | Freq: Every day | ORAL | Status: DC
  Administered 2015-06-25: 60 mg via ORAL
  Filled 2015-06-25: qty 1

## 2015-06-25 NOTE — Discharge Summary (Signed)
Physician Discharge Summary  [name] [name] OTL:[id number] DOB: 1955/11/03 DOA: 06/22/2015  PCP: Wyatt Haste, MD  Admit date: 06/22/2015 Discharge date: 06/25/2015  Time spent: 35 minutes  Recommendations for Outpatient Follow-up:  1. Has left UPJ stone associate with mild to moderate hydronephrosis, he was evaluated by Dr. Risa Grill of urology during this hospitalization who recommended follow-up at [facility] urology for further assessment and treatment in 1-2 weeks. 2. He was discharged on 4 days (for a total of 7 days) of ciprofloxacin and Flagyl 3. Follow-up on blood pressures, patient hypertensive during this hospitalization   Discharge Diagnoses:  Principal Problem:   Acute kidney injury superimposed on chronic kidney disease ([place]) Active Problems:   Oropharyngeal cancer ([place])   Left nephrolithiasis   Hydronephrosis, left   Acute diverticulitis   Hypothyroidism   Noncompliance with follow up and medications   Discharge Condition: Stable  Diet recommendation: Regular diet  Filed Weights   06/22/15 2321 06/23/15 0100 06/23/15 0930  Weight: 78.4 kg (172 lb 13.5 oz) 81.3 kg (179 lb 3.7 oz) 77.565 kg (171 lb)    History of present illness:  [name] [name] is a 60 y.o. male with medical history significant of oropharyngeal cancer, s/p chemo and radiation therapy which has left him with CKD baseline creatinine of about 1.8, missed his last follow up appointment with cancer center and isnt compliant with his synthroid.  Patient presents to the ED today with 2 day history of gradually worsening abdominal pain. Pain is worse in the LLQ, associated with nausea and multiple episodes of vomiting. Also states he has a "couple week" history of back tenderness as well that he attributes to "arthritis". Does endorse chills.  Hospital Course:  [name] [name] is a pleasant 60 year old gentleman with a history of oropharyngeal cancer, admitted to the medicine service on 06/22/2015 when he  presented with complaints of abdominal pain. Further workup with CT scan of abdomen and pelvis revealed mild to moderate left-sided hydronephrosis session with a 2.1 x 1.5 cm obstructing stone at the left renal pelvis. Radiology also noted scattered diverticulosis along the descending and sigmoid colon associated with wall thickening consistent with diverticulitis. He was made nothing by mouth, given IV fluids, narcotic analgesia, empiric IV antibiotic therapy. During this hospitalization he was seen and evaluated by Dr. Risa Grill of urology who fell abdominal symptoms likely reflected acute diverticulitis rather than kidney stones. Urology did not recommend urgent urologic procedure during this hospitalization. He recommended patient follow with in the office to discuss treatments which could've include percutaneous nephrolithotomy versus ESWL vs ureteroscopy. He showed improvement from a clinical standpoint and by 06/25/2015 was tolerating regular diet. He was discharged in stable condition on 06/25/2015.  Consultations:  Urology  Discharge Exam: Filed Vitals:   06/25/15 0100 06/25/15 0502  BP: 174/84 175/87  Pulse:  56  Temp:  98.6 F (37 C)  Resp:  16    General: Nontoxic appearing awake and alert, he is ambulating around his room Cardiovascular: Regular rate and rhythm normal S1-S2 no murmurs rubs or gallops Respiratory: Normal respiratory effort Abdomen: Soft nontender nondistended Extremities: No edema  Discharge Instructions   Discharge Instructions    Call MD for:  difficulty breathing, headache or visual disturbances    Complete by:  As directed      Call MD for:  extreme fatigue    Complete by:  As directed      Call MD for:  hives    Complete by:  As directed  Call MD for:  persistant dizziness or light-headedness    Complete by:  As directed      Call MD for:  persistant nausea and vomiting    Complete by:  As directed      Call MD for:  redness, tenderness, or signs  of infection (pain, swelling, redness, odor or green/yellow discharge around incision site)    Complete by:  As directed      Call MD for:  severe uncontrolled pain    Complete by:  As directed      Call MD for:  temperature >100.4    Complete by:  As directed      Call MD for:    Complete by:  As directed      Diet - low sodium heart healthy    Complete by:  As directed      Increase activity slowly    Complete by:  As directed           Current Discharge Medication List    START taking these medications   Details  ciprofloxacin (CIPRO) 500 MG tablet Take 1 tablet (500 mg total) by mouth 2 (two) times daily. Qty: 6 tablet, Refills: 0    isosorbide mononitrate (IMDUR) 60 MG 24 hr tablet Take 1 tablet (60 mg total) by mouth daily. Qty: 30 tablet, Refills: 0    metroNIDAZOLE (FLAGYL) 500 MG tablet Take 1 tablet (500 mg total) by mouth 3 (three) times daily. Qty: 12 tablet, Refills: 0      CONTINUE these medications which have CHANGED   Details  levothyroxine (SYNTHROID, LEVOTHROID) 100 MCG tablet Take 1 tablet (100 mcg total) by mouth daily before breakfast. Qty: 30 tablet, Refills: 3      CONTINUE these medications which have NOT CHANGED   Details  hydrochlorothiazide (MICROZIDE) 12.5 MG capsule TAKE ONE CAPSULE BY MOUTH EVERY DAY Qty: 30 capsule, Refills: 1      STOP taking these medications     Pseudoeph-Doxylamine-DM-APAP (NYQUIL PO)        No Known Allergies Follow-up Information    Follow up with Wyatt Haste, MD.   Specialty:  Family Medicine   Contact information:   [place] [address] [phone]       Follow up with Wyatt Haste, MD In 2 weeks.   Specialty:  Family Medicine   Contact information:   [place] [address] [phone]        The results of significant diagnostics from this hospitalization (including imaging, microbiology, ancillary and laboratory) are listed below  for reference.    Significant Diagnostic Studies: Ct Abdomen Pelvis Wo Contrast  06/22/2015  CLINICAL DATA:  Acute onset of left-sided abdominal pain. Vomiting and dizziness. Initial encounter. EXAM: CT ABDOMEN AND PELVIS WITHOUT CONTRAST TECHNIQUE: Multidetector CT imaging of the abdomen and pelvis was performed following the standard protocol without IV contrast. COMPARISON:  PET/CT performed 03/30/2011 FINDINGS: The visualized lung bases are clear. Scattered coronary artery calcifications are seen. The liver and spleen are unremarkable in appearance. The gallbladder is within normal limits. The pancreas is unremarkable. There is suggestion of a 1.2 cm adrenal adenoma at the left adrenal gland. The right adrenal gland is grossly unremarkable. There is mild-to-moderate left-sided hydronephrosis, with left-sided perinephric stranding and fluid. This reflects a large obstructing stone at the left renal pelvis, measuring 2.1 x 1.5 cm. Mild nonspecific right-sided perinephric stranding is seen. Nonobstructing right-sided renal stones measure up to 5 mm in size.  A single smaller nonobstructing left renal stone is also seen. No free fluid is identified. The small bowel is unremarkable in appearance. The stomach is within normal limits. No acute vascular abnormalities are seen. Scattered calcification is noted along the abdominal aorta and its branches. There is ectasia of the infrarenal abdominal aorta to nearly 3.0 cm in transverse dimension and 2.7 cm in AP dimension, without significant aneurysmal dilatation. The patient is status post appendectomy. Scattered diverticulosis is noted along the descending and sigmoid colon. Wall thickening is noted along the sigmoid colon, raising question for mild diverticulitis. The bladder is moderately distended and grossly unremarkable. The prostate is mildly enlarged, measuring 5.2 cm in transverse dimension. No inguinal lymphadenopathy is seen. No acute osseous abnormalities  are identified. IMPRESSION: 1. Mild-to-moderate left-sided hydronephrosis, reflecting a large obstructing stone at the left renal pelvis, measuring 2.1 x 1.5 cm. 2. Scattered diverticulosis along the descending and sigmoid colon. Wall thickening at the sigmoid colon raises question for mild diverticulitis. 3. Nonobstructing bilateral renal stones, more prominent on the right, measuring up to 5 mm in size. 4. 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 5. Suggestion of 1.2 cm left adrenal adenoma. 6. Scattered coronary artery calcifications noted. 7. Mildly enlarged prostate noted. Electronically Signed   By: Garald Balding M.D.   On: 06/22/2015 22:39    Microbiology: No results found for this or any previous visit (from the past 240 hour(s)).   Labs: Basic Metabolic Panel:  Recent Labs Lab 06/22/15 2117 06/23/15 0951 06/24/15 0425  NA 135 141 141  K 3.7 3.7 3.5  CL 110 110 109  CO2 19* 19* 21*  GLUCOSE 98 93 87  BUN '17 17 17  '$ CREATININE 2.27* 1.86* 1.68*  CALCIUM 8.7* 8.3* 8.7*   Liver Function Tests:  Recent Labs Lab 06/22/15 2117  AST 16  ALT 14*  ALKPHOS 90  BILITOT 0.5  PROT 7.2  ALBUMIN 3.8    Recent Labs Lab 06/22/15 2117  LIPASE 29   No results for input(s): AMMONIA in the last 168 hours. CBC:  Recent Labs Lab 06/22/15 2117 06/23/15 0951 06/24/15 0425 06/25/15 0418  WBC 14.7* 9.7 7.5 8.5  NEUTROABS 12.8*  --   --   --   HGB 14.6 13.2 13.0 14.0  HCT 43.2 40.3 39.7 41.9  MCV 79.6 81.7 80.2 81.2  PLT 184 165 149* 163   Cardiac Enzymes: No results for input(s): CKTOTAL, CKMB, CKMBINDEX, TROPONINI in the last 168 hours. BNP: BNP (last 3 results) No results for input(s): BNP in the last 8760 hours.  ProBNP (last 3 results) No results for input(s): PROBNP in the last 8760 hours.  CBG: No results for input(s): GLUCAP in the last 168  hours.     Signed:  Kelvin Cellar MD.  [facility] 06/25/2015, 6:37 AM

## 2015-06-25 NOTE — Progress Notes (Signed)
Noted elevated systolic 007. Above parameters per M.D. Covering provider Tylene Fantasia with new parameters orders. Pt asymptomatic at this time noted pulse brady. Will continue to monitor per MD orders and unit protocol.Marland Kitchen

## 2015-06-25 NOTE — Care Management Note (Signed)
Case Management Note  Patient Details  Name: YASSIN SCALES MRN: [id number] Date of Birth: [birth year]  Subjective/Objective:                  Acute kidney injury superimposed on chronic kidney disease [facility]) Action/Plan: Discharge planning Expected Discharge Date:  06/25/15               Expected Discharge Plan:  Home/Self Care  In-House Referral:     Discharge planning Services  CM Consult, Medication Assistance, [facility]  Post Acute Care Choice:    Choice offered to:  Patient  DME Arranged:    DME Agency:     HH Arranged:    [place] Agency:     Status of Service:  Completed, signed off  Medicare Important Message Given:    Date Medicare IM Given:    Medicare IM give by:    Date Additional Medicare IM Given:    Additional Medicare Important Message give by:     If discussed at [place] of Stay Meetings, dates discussed:    Additional Comments: CM met with pt who verbalized understanding he will go to the [facility] walkin on Thursday or Friday morning at 08:30 am and ask for AN APPOINTMENT TO SECURE A PCP; AN APPOINTMENT WITH A NAVIGATOR TO SECURE INSURANCE; AN APPOINTMENT FOR FOLLOW UP MEDICAL CARE.  Pt has been prescribed Cipro and Synthroid; both are $4 at [facility] and [place] not needed.  No other CM needs were communicated. Dellie Catholic, RN 06/25/2015, 10:50 AM

## 2015-07-18 ENCOUNTER — Ambulatory Visit (INDEPENDENT_AMBULATORY_CARE_PROVIDER_SITE_OTHER): Payer: No Typology Code available for payment source | Admitting: Family Medicine

## 2015-07-18 ENCOUNTER — Encounter: Payer: Self-pay | Admitting: Family Medicine

## 2015-07-18 VITALS — BP 162/98 | HR 53 | Resp 18 | Wt 168.0 lb

## 2015-07-18 DIAGNOSIS — N200 Calculus of kidney: Secondary | ICD-10-CM

## 2015-07-18 DIAGNOSIS — Z1159 Encounter for screening for other viral diseases: Secondary | ICD-10-CM

## 2015-07-18 DIAGNOSIS — Z114 Encounter for screening for human immunodeficiency virus [HIV]: Secondary | ICD-10-CM

## 2015-07-18 DIAGNOSIS — I10 Essential (primary) hypertension: Secondary | ICD-10-CM

## 2015-07-18 LAB — COMPLETE METABOLIC PANEL WITH GFR
ALBUMIN: 3.9 g/dL (ref 3.6–5.1)
ALK PHOS: 81 U/L (ref 40–115)
ALT: 9 U/L (ref 9–46)
AST: 9 U/L — ABNORMAL LOW (ref 10–35)
BUN: 14 mg/dL (ref 7–25)
CALCIUM: 8.8 mg/dL (ref 8.6–10.3)
CHLORIDE: 108 mmol/L (ref 98–110)
CO2: 22 mmol/L (ref 20–31)
CREATININE: 1.39 mg/dL — AB (ref 0.70–1.25)
GFR, EST AFRICAN AMERICAN: 63 mL/min (ref >=60)
GFR, Est Non African American: 55 mL/min — ABNORMAL LOW (ref >=60)
Glucose, Bld: 99 mg/dL (ref 65–99)
Potassium: 4 mmol/L (ref 3.5–5.3)
Sodium: 141 mmol/L (ref 135–146)
Total Bilirubin: 0.3 mg/dL (ref 0.2–1.2)
Total Protein: 6.5 g/dL (ref 6.1–8.1)

## 2015-07-18 LAB — LIPID PANEL
CHOLESTEROL: 154 mg/dL (ref 125–200)
HDL: 20 mg/dL — ABNORMAL LOW (ref >=40)
LDL Cholesterol: 98 mg/dL (ref <130)
TRIGLYCERIDES: 182 mg/dL — AB (ref <150)
Total CHOL/HDL Ratio: 7.7 Ratio — ABNORMAL HIGH (ref <=5.0)
VLDL: 36 mg/dL — AB (ref <30)

## 2015-07-18 LAB — TSH: TSH: 1.83 mIU/L (ref 0.40–4.50)

## 2015-07-18 LAB — HIV ANTIBODY (ROUTINE TESTING W REFLEX): HIV: NONREACTIVE

## 2015-07-18 MED ORDER — LEVOTHYROXINE SODIUM 100 MCG PO TABS: 100.0000 ug | ORAL_TABLET | Freq: Every day | ORAL | Status: DC

## 2015-07-18 MED ORDER — LISINOPRIL 20 MG PO TABS: 20.0000 mg | ORAL_TABLET | Freq: Every day | ORAL | Status: DC

## 2015-07-18 NOTE — Progress Notes (Signed)
Patient ID: [name] [name], male   DOB: [birth year], 60 y.o.   MRN: [id number]   [name] [name], is a 60 y.o. male  DPO:[id number]  ERX:[id number]  DOB - November 29, 1955  CC:  Chief Complaint  Patient presents with  . Establish Care       HPI: [name] [name] is a 60 y.o. male here to establish care. Patient was seen in ED and was admitted on 4/23-4/26 for kidney stones. He was assess by Dr. Risa Grill and was instructed to follow-up with [facility]. That has not yet happened.  He reports he has had no further pain since discharge. He was also treated at that time for diverticulitis. 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 He has not seen by oncology in about a year. He has a history of renal insuffiency due to the chemo therapy. He is on synthroid for hypothyroidism and Imdur for hypertension. He apparently has a history of non-adherence to his medical regime.  He reports feeling "funny" since he has been back on his synthroid and the Imdur. He reports continuing to smoke 1/2 pack of cigarettes daily. He reports only occasional alcohol use and denies drug use.   Health Maintenance:  Is in need of Tdap, colon cancer screening, HIV and Hep C screening.   No Known Allergies Past Medical History  Diagnosis Date  . Oropharyngeal cancer ([place]) 2012  . Renal insufficiency 2012    Secondary to Hx. of Cisplatin and Dhydrtion  . Status post radiation therapy 10/11/10 - 12/24/10    Bilateral Neck and Mucosa axis /  70 gray in 35 fractions  . Arthritis   . Full dentures     Fitting Per Dr. Enrique Sack  . Depression 06/12/11    Buproprion per Dr. Lamonte Sakai  . Smoking 06/12/11    1/2 pack/day  . Status post chemotherapy 10/30/10 - 11/09/10     2 doses of Q 3 week Cisplatin  . Xerostomia   . Protein calorie malnutrition ([place]) 05/31/11  . Hypertension    No current outpatient prescriptions on file prior to visit.   No current facility-administered medications on file prior to  visit.   Family History  Problem Relation Age of Onset  . Cancer Father 100    colon  . Cancer Sister 59    leukemia   Social History   Social History  . Marital Status: Single    Spouse Name: N/A  . Number of Children: N/A  . Years of Education: N/A   Occupational History  . Not on file.   Social History Main Topics  . Smoking status: Current Every Day Smoker -- 0.25 packs/day for 25 years    Types: Cigarettes  . Smokeless tobacco: Not on file     Comment: Smoking 4-5 cigarettes daily on average  . Alcohol Use: No  . Drug Use: No  . Sexual Activity: Not on file   Other Topics Concern  . Not on file   Social History Narrative    Review of Systems: Constitutional: Negative for fever, chills, appetite change, weight loss.Positive for fatigue. Skin: Negative for rashes or lesions of concern. HENT: Negative for ear pain, ear discharge.nose bleeds. Positive for some ringing in the ears Eyes: Negative for pain, discharge, redness, itching.Partial blindness left eye.  Neck: Negative for pain, stiffness Respiratory: Reports vague shortness of breath, coughing, wheezing. "nothing that needs attention"   Cardiovascular: Negative for chest pain, palpitations and leg swelling. Gastrointestinal: Negative for  abdominal pain, nausea, vomiting, diarrhea.Some constipation and heartburn. Genitourinary: Negative for dysuria, urgency, hematuria. Positive for frequency Musculoskeletal: Positive for back, knee and ankle pain. Negative for gait problem.Negative for weakness. Neurological: Negative for dizziness, tremors, seizures, syncope,   light-headedness, numbness and headaches.  Hematological: Negative for easy bruising or bleeding Psychiatric/Behavioral: Negative for depression, anxiety, decreased concentration, confusion   Objective:   Filed Vitals:   07/18/15 0900 07/18/15 0913  BP: 170/83 162/98  Pulse: 53   Resp: 18     Physical Exam: Constitutional: Patient appears  well-developed and well-nourished. No distress. HENT: Normocephalic, atraumatic, External right and left ear normal. Oropharynx is clear and moist.  Eyes: Conjunctivae and EOM are normal. PERRLA, no scleral icterus. Neck: Normal ROM. Neck supple. No lymphadenopathy, No thyromegaly. CVS: RRR, S1/S2 +, no murmurs, no gallops, no rubs Pulmonary: Effort and breath sounds normal, no stridor, rhonchi, wheezes, rales.  Abdominal: Soft. Normoactive BS,, no distension, tenderness, rebound or guarding.  Musculoskeletal: Normal range of motion. No edema and no tenderness.  Neuro: Alert.Normal muscle tone coordination. Non-focal Skin: Skin is warm and dry. No rash noted. Not diaphoretic. No erythema. No pallor.There is evidence of easy bruising Psychiatric: Normal mood and affect. Behavior, judgment, thought content normal.  Lab Results  Component Value Date   WBC 8.5 06/25/2015   HGB 14.0 06/25/2015   HCT 41.9 06/25/2015   MCV 81.2 06/25/2015   PLT 163 06/25/2015   Lab Results  Component Value Date   CREATININE 1.68* 06/24/2015   BUN 17 06/24/2015   NA 141 06/24/2015   K 3.5 06/24/2015   CL 109 06/24/2015   CO2 21* 06/24/2015    No results found for: HGBA1C Lipid Panel  No results found for: CHOL, TRIG, HDL, CHOLHDL, VLDL, LDLCALC     Assessment and plan:   1. Essential hypertension  - COMPLETE METABOLIC PANEL WITH GFR - Lipid panel - TSH  2. Screening for HIV (human immunodeficiency virus)  - HIV antibody (with reflex)  3. Need for hepatitis C screening test  - Hepatitis c antibody (reflex)  4. Kidney stones  - Ambulatory referral to Urology   Return in about 4 weeks (around 08/15/2015).  The patient was given clear instructions to go to ER or return to medical center if symptoms don't improve, worsen or new problems develop. The patient verbalized understanding.    Micheline Chapman FNP  07/18/2015, 11:31 AM

## 2015-07-18 NOTE — Patient Instructions (Signed)
1. Changing BP medication.  Stop Imdur and start lisinopril 2. Nurse visit for BP check in one month. 3. Will set up appointment with urology.

## 2015-07-19 LAB — HEPATITIS C ANTIBODY: HCV AB: NEGATIVE

## 2015-07-29 ENCOUNTER — Other Ambulatory Visit: Payer: Self-pay

## 2015-07-29 MED ORDER — LISINOPRIL 20 MG PO TABS: 20.0000 mg | ORAL_TABLET | Freq: Every day | ORAL | Status: DC

## 2015-07-29 NOTE — Telephone Encounter (Signed)
Refill for lisinopril sent into pharmacy. Thanks!

## 2015-08-01 ENCOUNTER — Other Ambulatory Visit: Payer: Self-pay | Admitting: Family Medicine

## 2015-08-01 ENCOUNTER — Ambulatory Visit: Payer: No Typology Code available for payment source | Admitting: *Deleted

## 2015-08-01 ENCOUNTER — Telehealth: Payer: Self-pay | Admitting: *Deleted

## 2015-08-01 VITALS — BP 156/79 | HR 60 | Temp 97.9°F | Resp 18 | Ht 69.0 in | Wt 173.0 lb

## 2015-08-01 DIAGNOSIS — Z0130 Encounter for examination of blood pressure without abnormal findings: Secondary | ICD-10-CM

## 2015-08-01 MED ORDER — AMLODIPINE BESYLATE 5 MG PO TABS: 5.0000 mg | ORAL_TABLET | Freq: Every day | ORAL | Status: DC

## 2015-08-01 NOTE — Progress Notes (Signed)
Patient is here for BP recheck.

## 2015-10-09 NOTE — Telephone Encounter (Signed)
Error

## 2016-01-03 ENCOUNTER — Other Ambulatory Visit: Payer: Self-pay | Admitting: Family Medicine

## 2016-04-26 ENCOUNTER — Other Ambulatory Visit: Payer: Self-pay | Admitting: Family Medicine

## 2016-11-14 ENCOUNTER — Emergency Department (HOSPITAL_COMMUNITY): Payer: Self-pay

## 2016-11-14 ENCOUNTER — Encounter (HOSPITAL_COMMUNITY): Payer: Self-pay | Admitting: Emergency Medicine

## 2016-11-14 ENCOUNTER — Inpatient Hospital Stay (HOSPITAL_COMMUNITY)
Admission: EM | Admit: 2016-11-14 | Discharge: 2016-11-16 | DRG: 694 | Disposition: A | Discharge status: Home / Self Care | Payer: Self-pay | Attending: Internal Medicine | Admitting: Internal Medicine

## 2016-11-14 DIAGNOSIS — Z23 Encounter for immunization: Secondary | ICD-10-CM

## 2016-11-14 DIAGNOSIS — Z79899 Other long term (current) drug therapy: Secondary | ICD-10-CM

## 2016-11-14 DIAGNOSIS — N183 Chronic kidney disease, stage 3 (moderate): Secondary | ICD-10-CM | POA: Diagnosis present

## 2016-11-14 DIAGNOSIS — F17210 Nicotine dependence, cigarettes, uncomplicated: Secondary | ICD-10-CM | POA: Diagnosis present

## 2016-11-14 DIAGNOSIS — I160 Hypertensive urgency: Secondary | ICD-10-CM | POA: Diagnosis present

## 2016-11-14 DIAGNOSIS — F329 Major depressive disorder, single episode, unspecified: Secondary | ICD-10-CM | POA: Diagnosis present

## 2016-11-14 DIAGNOSIS — I129 Hypertensive chronic kidney disease with stage 1 through stage 4 chronic kidney disease, or unspecified chronic kidney disease: Secondary | ICD-10-CM | POA: Diagnosis present

## 2016-11-14 DIAGNOSIS — Z800 Family history of malignant neoplasm of digestive organs: Secondary | ICD-10-CM

## 2016-11-14 DIAGNOSIS — R030 Elevated blood-pressure reading, without diagnosis of hypertension: Secondary | ICD-10-CM

## 2016-11-14 DIAGNOSIS — Z79890 Hormone replacement therapy (postmenopausal): Secondary | ICD-10-CM

## 2016-11-14 DIAGNOSIS — N132 Hydronephrosis with renal and ureteral calculous obstruction: Principal | ICD-10-CM | POA: Diagnosis present

## 2016-11-14 DIAGNOSIS — Z85818 Personal history of malignant neoplasm of other sites of lip, oral cavity, and pharynx: Secondary | ICD-10-CM

## 2016-11-14 DIAGNOSIS — Z806 Family history of leukemia: Secondary | ICD-10-CM

## 2016-11-14 DIAGNOSIS — N179 Acute kidney failure, unspecified: Secondary | ICD-10-CM | POA: Diagnosis present

## 2016-11-14 DIAGNOSIS — Z9221 Personal history of antineoplastic chemotherapy: Secondary | ICD-10-CM

## 2016-11-14 DIAGNOSIS — E876 Hypokalemia: Secondary | ICD-10-CM | POA: Diagnosis present

## 2016-11-14 DIAGNOSIS — I700 Atherosclerosis of aorta: Secondary | ICD-10-CM | POA: Diagnosis present

## 2016-11-14 DIAGNOSIS — N200 Calculus of kidney: Secondary | ICD-10-CM

## 2016-11-14 DIAGNOSIS — Z923 Personal history of irradiation: Secondary | ICD-10-CM

## 2016-11-14 DIAGNOSIS — E039 Hypothyroidism, unspecified: Secondary | ICD-10-CM | POA: Diagnosis present

## 2016-11-14 LAB — COMPREHENSIVE METABOLIC PANEL
ALBUMIN: 3.8 g/dL (ref 3.5–5.0)
ALT: 13 U/L — AB (ref 17–63)
AST: 17 U/L (ref 15–41)
Alkaline Phosphatase: 86 U/L (ref 38–126)
Anion gap: 10 (ref 5–15)
BUN: 19 mg/dL (ref 6–20)
CHLORIDE: 108 mmol/L (ref 101–111)
CO2: 20 mmol/L — AB (ref 22–32)
Calcium: 8.8 mg/dL — ABNORMAL LOW (ref 8.9–10.3)
Creatinine, Ser: 2.42 mg/dL — ABNORMAL HIGH (ref 0.61–1.24)
GFR calc non Af Amer: 27 mL/min — ABNORMAL LOW (ref >60)
GFR, EST AFRICAN AMERICAN: 32 mL/min — AB (ref >60)
GLUCOSE: 108 mg/dL — AB (ref 65–99)
Potassium: 3.7 mmol/L (ref 3.5–5.1)
SODIUM: 138 mmol/L (ref 135–145)
Total Bilirubin: 0.5 mg/dL (ref 0.3–1.2)
Total Protein: 7.2 g/dL (ref 6.5–8.1)

## 2016-11-14 LAB — URINALYSIS, MICROSCOPIC (REFLEX)
Bacteria, UA: NONE SEEN
WBC, UA: NONE SEEN WBC/hpf (ref 0–5)

## 2016-11-14 LAB — URINALYSIS, ROUTINE W REFLEX MICROSCOPIC
BILIRUBIN URINE: NEGATIVE
Glucose, UA: NEGATIVE mg/dL
Ketones, ur: NEGATIVE mg/dL
LEUKOCYTES UA: NEGATIVE
NITRITE: NEGATIVE
PH: 7 (ref 5.0–8.0)
Specific Gravity, Urine: 1.015 (ref 1.005–1.030)

## 2016-11-14 LAB — CBC
HCT: 42.5 % (ref 39.0–52.0)
HEMOGLOBIN: 14.1 g/dL (ref 13.0–17.0)
MCH: 27.9 pg (ref 26.0–34.0)
MCHC: 33.2 g/dL (ref 30.0–36.0)
MCV: 84.2 fL (ref 78.0–100.0)
Platelets: 177 10*3/uL (ref 150–400)
RBC: 5.05 MIL/uL (ref 4.22–5.81)
RDW: 15.6 % — ABNORMAL HIGH (ref 11.5–15.5)
WBC: 12.7 10*3/uL — ABNORMAL HIGH (ref 4.0–10.5)

## 2016-11-14 LAB — LIPASE, BLOOD: LIPASE: 37 U/L (ref 11–51)

## 2016-11-14 MED ORDER — SODIUM CHLORIDE 0.9 % IV BOLUS (SEPSIS)
1000.0000 mL | Freq: Once | INTRAVENOUS | Status: AC
  Administered 2016-11-14: 1000 mL via INTRAVENOUS

## 2016-11-14 MED ORDER — MORPHINE SULFATE (PF) 4 MG/ML IV SOLN
4.0000 mg | Freq: Once | INTRAVENOUS | Status: AC
  Administered 2016-11-14: 4 mg via INTRAVENOUS
  Filled 2016-11-14: qty 1

## 2016-11-14 MED ORDER — HEPARIN SODIUM (PORCINE) 5000 UNIT/ML IJ SOLN
5000.0000 [IU] | Freq: Three times a day (TID) | INTRAMUSCULAR | Status: DC
  Administered 2016-11-14 – 2016-11-16 (×4): 5000 [IU] via SUBCUTANEOUS
  Filled 2016-11-14 (×4): qty 1

## 2016-11-14 MED ORDER — DEXTROSE-NACL 5-0.9 % IV SOLN
INTRAVENOUS | Status: DC
  Administered 2016-11-14 – 2016-11-15 (×3): via INTRAVENOUS

## 2016-11-14 MED ORDER — CIPROFLOXACIN IN D5W 400 MG/200ML IV SOLN
400.0000 mg | Freq: Once | INTRAVENOUS | Status: AC
  Administered 2016-11-15: 400 mg via INTRAVENOUS
  Filled 2016-11-14: qty 200

## 2016-11-14 MED ORDER — PANTOPRAZOLE SODIUM 40 MG PO TBEC
40.0000 mg | DELAYED_RELEASE_TABLET | Freq: Every day | ORAL | Status: DC
  Administered 2016-11-14 – 2016-11-16 (×3): 40 mg via ORAL
  Filled 2016-11-14 (×3): qty 1

## 2016-11-14 MED ORDER — HYDRALAZINE HCL 50 MG PO TABS
75.0000 mg | ORAL_TABLET | Freq: Three times a day (TID) | ORAL | Status: DC
  Administered 2016-11-14 – 2016-11-16 (×5): 75 mg via ORAL
  Filled 2016-11-14 (×5): qty 1

## 2016-11-14 MED ORDER — HYDROMORPHONE HCL-NACL 0.5-0.9 MG/ML-% IV SOSY
0.5000 mg | PREFILLED_SYRINGE | INTRAVENOUS | Status: DC | PRN
  Administered 2016-11-14 – 2016-11-16 (×4): 1 mg via INTRAVENOUS
  Filled 2016-11-14 (×5): qty 2

## 2016-11-14 MED ORDER — HYDROMORPHONE HCL-NACL 0.5-0.9 MG/ML-% IV SOSY
0.5000 mg | PREFILLED_SYRINGE | INTRAVENOUS | Status: DC | PRN
  Administered 2016-11-14 (×2): 0.5 mg via INTRAVENOUS
  Filled 2016-11-14 (×2): qty 1

## 2016-11-14 MED ORDER — ONDANSETRON HCL 4 MG/2ML IJ SOLN: 4.0000 mg | Freq: Four times a day (QID) | INTRAMUSCULAR | Status: DC | PRN

## 2016-11-14 MED ORDER — AMLODIPINE BESYLATE 5 MG PO TABS
5.0000 mg | ORAL_TABLET | Freq: Every day | ORAL | Status: DC
  Administered 2016-11-14 – 2016-11-15 (×2): 5 mg via ORAL
  Filled 2016-11-14 (×2): qty 1

## 2016-11-14 MED ORDER — LEVOTHYROXINE SODIUM 100 MCG PO TABS
100.0000 ug | ORAL_TABLET | Freq: Every day | ORAL | Status: DC
  Administered 2016-11-15 – 2016-11-16 (×2): 100 ug via ORAL
  Filled 2016-11-14 (×2): qty 1

## 2016-11-14 MED ORDER — HYDRALAZINE HCL 25 MG PO TABS
25.0000 mg | ORAL_TABLET | Freq: Once | ORAL | Status: AC
  Administered 2016-11-14: 25 mg via ORAL
  Filled 2016-11-14: qty 1

## 2016-11-14 MED ORDER — HYDRALAZINE HCL 50 MG PO TABS
50.0000 mg | ORAL_TABLET | Freq: Three times a day (TID) | ORAL | Status: DC
  Administered 2016-11-14: 50 mg via ORAL
  Filled 2016-11-14: qty 1

## 2016-11-14 MED ORDER — HYDROCODONE-ACETAMINOPHEN 5-325 MG PO TABS
1.0000 | ORAL_TABLET | Freq: Four times a day (QID) | ORAL | Status: DC | PRN
  Administered 2016-11-14 – 2016-11-16 (×4): 1 via ORAL
  Filled 2016-11-14 (×2): qty 2
  Filled 2016-11-14 (×3): qty 1

## 2016-11-14 MED ORDER — INFLUENZA VAC SPLIT QUAD 0.5 ML IM SUSY
0.5000 mL | PREFILLED_SYRINGE | INTRAMUSCULAR | Status: AC
  Administered 2016-11-16: 0.5 mL via INTRAMUSCULAR
  Filled 2016-11-14: qty 0.5

## 2016-11-14 MED ORDER — HYDROMORPHONE HCL 1 MG/ML IJ SOLN
0.5000 mg | Freq: Once | INTRAMUSCULAR | Status: AC
  Administered 2016-11-14: 0.5 mg via INTRAVENOUS
  Filled 2016-11-14: qty 1

## 2016-11-14 NOTE — ED Notes (Addendum)
0822, Notified PA for pt BP verbally.

## 2016-11-14 NOTE — ED Triage Notes (Signed)
Patient complaining of lower abdominal pain and urinary retention x 2 days. Patient states that he has urinated but just a little. Patient abdomen is distended.

## 2016-11-14 NOTE — Progress Notes (Signed)
Received patient from ED, foley catheter intact, VS obtained, oriented to unit, call light placed in reach

## 2016-11-14 NOTE — ED Provider Notes (Signed)
[name] DEPT Provider Note   CSN: [id number] Arrival date & time: 11/14/16  0427     History   Chief Complaint Chief Complaint  Patient presents with  . Urinary Retention  . Abdominal Pain    HPI [name] [name] is a 61 y.o. male.  The history is provided by the patient and medical records. No language interpreter was used.  Abdominal Pain   Associated symptoms include nausea.   [name] [name] is a 61 y.o. male  with a PMH as listed below who presents to the Emergency Department complaining of acute onset of sharp, constant left flank and lower quadrant pain which began last night. No alleviating or aggravating factors noted. History of similar pain with kidney stone in the past, but not as severe. Associated with nausea. Patient states that he tried to urinate but very little came out. He has not been able to urinate as usual since last night. No fever or chills. No recent illness. No vomiting.    Past Medical History:  Diagnosis Date  . Arthritis   . Depression 06/12/11   Buproprion per Dr. Lamonte Sakai  . Full dentures    Fitting Per Dr. Enrique Sack  . Hypertension   . Oropharyngeal cancer ([place]) 2012  . Protein calorie malnutrition ([place]) 05/31/11  . Renal insufficiency 2012   Secondary to Hx. of Cisplatin and Dhydrtion  . Smoking 06/12/11   1/2 pack/day  . Status post chemotherapy 10/30/10 - 11/09/10    2 doses of Q 3 week Cisplatin  . Status post radiation therapy 10/11/10 - 12/24/10   Bilateral Neck and Mucosa axis /  70 gray in 35 fractions  . Xerostomia     Patient Active Problem List   Diagnosis Date Noted  . Hypothyroidism 06/23/2015  . Noncompliance with follow up and medications 06/23/2015  . Acute kidney injury superimposed on chronic kidney disease ([place]) 06/22/2015  . Left nephrolithiasis 06/22/2015  . Hydronephrosis, left 06/22/2015  . Acute diverticulitis 06/22/2015  . 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 Oropharyngeal cancer ([place])   . Renal insufficiency     Past Surgical History:  Procedure Laterality Date  . APPENDECTOMY    . BIOPSY TONGUE     TonsilandBaseoftongue  . PEG TUBE REMOVAL  04/13/11       Home Medications    Prior to Admission medications   Medication Sig Start Date End Date Taking? Authorizing Provider  amLODipine (NORVASC) 5 MG tablet Take 1 tablet (5 mg total) by mouth daily. 08/01/15  Yes Micheline Chapman, NP  levothyroxine (SYNTHROID, LEVOTHROID) 100 MCG tablet TAKE 1 TABLET (100 MCG TOTAL) BY MOUTH DAILY BEFORE BREAKFAST. 01/05/16  Yes Micheline Chapman, NP  lisinopril (PRINIVIL,ZESTRIL) 20 MG tablet Take 1 tablet (20 mg total) by mouth daily. Patient not taking: Reported on 11/14/2016 07/29/15   Dorena Dew, FNP    Family History Family History  Problem Relation Age of Onset  . Cancer Father 26       colon  . Cancer Sister 76       leukemia    Social History Social History  Substance Use Topics  . Smoking status: Current Every Day Smoker    Packs/day: 0.25    Years: 25.00    Types: Cigarettes  . Smokeless tobacco: Never Used     Comment: Smoking 4-5  cigarettes daily on average  . Alcohol use No     Allergies   Patient has no known allergies.   Review of Systems Review of Systems  Gastrointestinal: Positive for abdominal pain and nausea.  Genitourinary: Positive for difficulty urinating.  All other systems reviewed and are negative.    Physical Exam Updated Vital Signs BP (!) 206/98 (BP Location: Left Arm)   Pulse 67   Temp 97.7 F (36.5 C) (Oral)   Resp (!) 21   Ht 5\' 9"  (1.753 m)   Wt 79.4 kg (175 lb)   SpO2 94%   BMI 25.84 kg/m   Physical Exam  Constitutional: He is oriented to person, place, and time. He appears well-developed and well-nourished. No distress.  HENT:  Head: Normocephalic and atraumatic.    Cardiovascular: Normal rate, regular rhythm and normal heart sounds.   No murmur heard. Pulmonary/Chest: Effort normal and breath sounds normal. No respiratory distress.  Abdominal: Soft. He exhibits no distension. There is tenderness (LLQ).  Musculoskeletal: He exhibits no edema.  Neurological: He is alert and oriented to person, place, and time.  Skin: Skin is warm and dry.  Nursing note and vitals reviewed.    ED Treatments / Results  Labs (all labs ordered are listed, but only abnormal results are displayed) Labs Reviewed  URINALYSIS, ROUTINE W REFLEX MICROSCOPIC - Abnormal; Notable for the following:       Result Value   Hgb urine dipstick LARGE (*)    Protein, ur TRACE (*)    All other components within normal limits  COMPREHENSIVE METABOLIC PANEL - Abnormal; Notable for the following:    CO2 20 (*)    Glucose, Bld 108 (*)    Creatinine, Ser 2.42 (*)    Calcium 8.8 (*)    ALT 13 (*)    GFR calc non Af Amer 27 (*)    GFR calc Af Amer 32 (*)    All other components within normal limits  CBC - Abnormal; Notable for the following:    WBC 12.7 (*)    RDW 15.6 (*)    All other components within normal limits  URINALYSIS, MICROSCOPIC (REFLEX) - Abnormal; Notable for the following:    Squamous Epithelial / LPF 0-5 (*)    All other components within normal limits  LIPASE, BLOOD    EKG  EKG Interpretation None       Radiology Ct Renal Stone Study  Result Date: 11/14/2016 CLINICAL DATA:  Lower abdominal pain in urinary retention over the last 2 days. EXAM: CT ABDOMEN AND PELVIS WITHOUT CONTRAST TECHNIQUE: Multidetector CT imaging of the abdomen and pelvis was performed following the standard protocol without IV contrast. COMPARISON:  06/22/2015 FINDINGS: Lower chest: Normal Hepatobiliary: 5 mm cyst at the dome as seen previously. Question 2 cm low-density area in the left lobe which appears similar to the study of April 2017 and is therefore likely benign, possibly a  hemangioma. Some prominence of the left lobe raises the question of early cirrhosis. No calcified gallstones. Pancreas: Normal Spleen: Normal Adrenals/Urinary Tract: Chronic bilateral adrenal prominence, possibly adrenal hyperplasia or small adenomas. No change. Right kidney shows multiple small nonobstructing stones, the largest 5 mm in the lower pole. No sign of passing stone on the right. Left kidney again demonstrates a large stone at the UPJ, maximal dimension 2.2 cm, with left hydronephrosis, renal swelling and surrounding edema. No stone in the left ureter distal to that. Foley catheter in the bladder. Bladder is thick walled.  There is an enlarged prostate. Stomach/Bowel: Diverticulosis without evidence of diverticulitis at this time. No acute bowel finding. Vascular/Lymphatic: Aortic atherosclerosis. Maximal diameter of the infrarenal aorta is 3 cm. Reproductive: Otherwise negative. Prostatic hypertrophy as noted above. Other: No free fluid or air. Musculoskeletal: Normal IMPRESSION: 2.2 cm stone at the left UPJ with left hydronephrosis, renal swelling and surrounding edema. Multiple small nonobstructing stones of the right kidney. Foley catheter in a thick-walled bladder.  Enlarged prostate. Stable appearance of the liver and adrenal glands since 2017. Therefore, the findings as described above are likely benign. Aortic atherosclerosis. Recommend followup by ultrasound in 3 years. This recommendation follows ACR consensus guidelines: White Paper of the ACR Incidental Findings Committee II on Vascular Findings. [facility] Coll Radiol 2013; 10:789-794 Diverticulosis without evidence of diverticulitis. Electronically Signed   By: Nelson Chimes M.D.   On: 11/14/2016 07:48    Procedures Procedures (including critical care time)  Medications Ordered in ED Medications  amLODipine (NORVASC) tablet 5 mg (5 mg Oral Given 11/14/16 0951)  sodium chloride 0.9 % bolus 1,000 mL (0 mLs Intravenous Stopped 11/14/16 0956)    morphine 4 MG/ML injection 4 mg (4 mg Intravenous Given 11/14/16 0759)  HYDROmorphone (DILAUDID) injection 0.5 mg (0.5 mg Intravenous Given 11/14/16 0952)     Initial Impression / Assessment and Plan / ED Course  I have reviewed the triage vital signs and the nursing notes.  Pertinent labs & imaging results that were available during my care of the patient were reviewed by me and considered in my medical decision making (see chart for details).    [name] [name] is a 61 y.o. male who presents to ED for left lower quadrant pain. Hypertensive upon arrival. Patient has not taken home BP meds in approximately one month. Home norvasc and pain meds given. Will continue to monitor BP. Urine without signs of infection. Labs reviewed and notable for increase in baseline creatinine. CT shows 2.2 cm stone with left hydro, renal swelling and surrounding edema. Urology consulted and has evaluated patient. Please see consultation note for full recommendations. Recommends nephrostomy tube and admission to medicine for BP and pain control. Hospitalist consulted who will admit.   Patient discussed with Dr. Randal Buba who agrees with treatment plan.    Final Clinical Impressions(s) / ED Diagnoses   Final diagnoses:  Acute kidney injury ([place])  Kidney stone  Elevated blood pressure reading    New Prescriptions New Prescriptions   No medications on file     Ward, Ozella Almond, PA-C 11/14/16 1009    Palumbo, April, MD 11/15/16 0130

## 2016-11-14 NOTE — ED Notes (Signed)
Notified Materials via phone of missing D5 in NS.

## 2016-11-14 NOTE — Progress Notes (Signed)
Paged attending MD RE: BP 213/102

## 2016-11-14 NOTE — Progress Notes (Signed)
Please call Joelene Millin at [phone] at 1205p for report. Andre Lefort

## 2016-11-14 NOTE — Consult Note (Signed)
Urology Consult Note   Requesting Provider:  Ward, Ozella Almond, PA-C Service Providing Consult: Urology  Consulting Attending: Dr. Karsten Ro   Reason for Consult:  Nephrolithiasis  HPI: [name] [name] is seen in consultation for reasons noted above at the request of No att. providers found for evaluation of nephrolithiasis.  This is a 61 y.o. male with PMH significant for nephrolithiasis, oropharyngeal cancer s/p chemo and radiation, CKD (baseline Cr ~1.8) and HTN who presented to the ED yesterday with c/o two days of LLQ pain and N/V.  He denied hematuria, dysuria, and fevers.  CT scan shows 2 cm left UPJ obstructing stone with mild-moderate hydro.   Was seen for the same in 05/2015 by Dr. Risa Grill, but did not follow up for definitive stone treatment.   Currently with significant pain despite IV pain medication. A Foley was placed in the ED for a PVR of 200cc. He denies a history of voiding or storage urinary symptoms previously,hematuria, UTIs, GU malignancy/trauma/surgery.  He smokes 1/2 ppd x 40 years.  Denies ETOH and other drug use.    Past Medical History: Past Medical History:  Diagnosis Date  . Arthritis   . Depression 06/12/11   Buproprion per Dr. Lamonte Sakai  . Full dentures    Fitting Per Dr. Enrique Sack  . Hypertension   . Oropharyngeal cancer ([place]) 2012  . Protein calorie malnutrition ([place]) 05/31/11  . Renal insufficiency 2012   Secondary to Hx. of Cisplatin and Dhydrtion  . Smoking 06/12/11   1/2 pack/day  . Status post chemotherapy 10/30/10 - 11/09/10    2 doses of Q 3 week Cisplatin  . Status post radiation therapy 10/11/10 - 12/24/10   Bilateral Neck and Mucosa axis /  70 gray in 35 fractions  . Xerostomia     Past Surgical History:  Past Surgical History:  Procedure Laterality Date  . APPENDECTOMY    . BIOPSY TONGUE     TonsilandBaseoftongue  . PEG TUBE REMOVAL  04/13/11    Medication: Current Facility-Administered Medications  Medication Dose Route Frequency Provider  Last Rate Last Dose  . amLODipine (NORVASC) tablet 5 mg  5 mg Oral Daily Ward, Ozella Almond, PA-C       Current Outpatient Prescriptions  Medication Sig Dispense Refill  . amLODipine (NORVASC) 5 MG tablet Take 1 tablet (5 mg total) by mouth daily. 90 tablet 3  . levothyroxine (SYNTHROID, LEVOTHROID) 100 MCG tablet TAKE 1 TABLET (100 MCG TOTAL) BY MOUTH DAILY BEFORE BREAKFAST. 30 tablet 3  . lisinopril (PRINIVIL,ZESTRIL) 20 MG tablet Take 1 tablet (20 mg total) by mouth daily. (Patient not taking: Reported on 11/14/2016) 90 tablet 1    Allergies: No Known Allergies  Social History: Social History  Substance Use Topics  . Smoking status: Current Every Day Smoker    Packs/day: 0.25    Years: 25.00    Types: Cigarettes  . Smokeless tobacco: Never Used     Comment: Smoking 4-5 cigarettes daily on average  . Alcohol use No    Family History Family History  Problem Relation Age of Onset  . Cancer Father 33       colon  . Cancer Sister 61       leukemia    Review of Systems 10 systems were reviewed and are negative except as noted specifically in the HPI.  Objective   Vital signs in last 24 hours: BP (!) 238/128 (BP Location: Right Arm)   Pulse 67   Temp 97.7 F (36.5 C) (  Oral)   Resp 18   Ht 5\' 9"  (1.753 m)   Wt 79.4 kg (175 lb)   SpO2 96%   BMI 25.84 kg/m   Physical Exam General: NAD, A&O, resting, appropriate HEENT: [place]/AT, EOMI, MMM Pulmonary: Normal work of breathing Cardiovascular: HDS, adequate peripheral perfusion Abdomen: Soft, TTP over LLQ, nondistended. GU: Foley in place draining clear yellow urine, no CVA tenderness Extremities: warm and well perfused Neuro: Appropriate, no focal neurological deficits  Most Recent Labs: Lab Results  Component Value Date   WBC 12.7 (H) 11/14/2016   HGB 14.1 11/14/2016   HCT 42.5 11/14/2016   PLT 177 11/14/2016    Lab Results  Component Value Date   NA 138 11/14/2016   K 3.7 11/14/2016   CL 108 11/14/2016    CO2 20 (L) 11/14/2016   BUN 19 11/14/2016   CREATININE 2.42 (H) 11/14/2016   CALCIUM 8.8 (L) 11/14/2016   MG 1.9 11/06/2010    Lab Results  Component Value Date   INR 0.97 10/13/2010   APTT 29 08/10/2010     IMAGING: Ct Renal Stone Study  Result Date: 11/14/2016 CLINICAL DATA:  Lower abdominal pain in urinary retention over the last 2 days. EXAM: CT ABDOMEN AND PELVIS WITHOUT CONTRAST TECHNIQUE: Multidetector CT imaging of the abdomen and pelvis was performed following the standard protocol without IV contrast. COMPARISON:  06/22/2015 FINDINGS: Lower chest: Normal Hepatobiliary: 5 mm cyst at the dome as seen previously. Question 2 cm low-density area in the left lobe which appears similar to the study of April 2017 and is therefore likely benign, possibly a hemangioma. Some prominence of the left lobe raises the question of early cirrhosis. No calcified gallstones. Pancreas: Normal Spleen: Normal Adrenals/Urinary Tract: Chronic bilateral adrenal prominence, possibly adrenal hyperplasia or small adenomas. No change. Right kidney shows multiple small nonobstructing stones, the largest 5 mm in the lower pole. No sign of passing stone on the right. Left kidney again demonstrates a large stone at the UPJ, maximal dimension 2.2 cm, with left hydronephrosis, renal swelling and surrounding edema. No stone in the left ureter distal to that. Foley catheter in the bladder. Bladder is thick walled. There is an enlarged prostate. Stomach/Bowel: Diverticulosis without evidence of diverticulitis at this time. No acute bowel finding. Vascular/Lymphatic: Aortic atherosclerosis. Maximal diameter of the infrarenal aorta is 3 cm. Reproductive: Otherwise negative. Prostatic hypertrophy as noted above. Other: No free fluid or air. Musculoskeletal: Normal IMPRESSION: 2.2 cm stone at the left UPJ with left hydronephrosis, renal swelling and surrounding edema. Multiple small nonobstructing stones of the right kidney. Foley  catheter in a thick-walled bladder.  Enlarged prostate. Stable appearance of the liver and adrenal glands since 2017. Therefore, the findings as described above are likely benign. Aortic atherosclerosis. Recommend followup by ultrasound in 3 years. This recommendation follows ACR consensus guidelines: White Paper of the ACR Incidental Findings Committee II on Vascular Findings. Natasha [facility] Coll Radiol 2013; 10:789-794 Diverticulosis without evidence of diverticulitis. Electronically Signed   By: Nelson Chimes M.D.   On: 11/14/2016 07:48  CT scan images were independently reviewed  ------  Assessment:  61 y.o. male with a 2.1cm L UPJ stone with obstruction. Cr 2.4 from baseline ~1.8. No evidence of infection. Pain not well controlled. BP in the 160'V systolic. Will need L percutaneous nephrostolithotomy (PCNL) in the coming weeks, but in the meantime would benefit from proximal drainage. Would favor PCN given need for PCNL for defintiven stone treatment. Additionally has right sided smaller  stone without obstruction.    Recommendations: - Recommend admission to medicine team for pain control, BP management - Consult IR for non-urgent left nephrostomy tube placement - Ok to remove Foley in the AM, start Flomax.  - Urology will continue to be available. Will need to be scheduled for PCNL in the coming weeks.    Thank you for this consult. Please contact the urology consult pager with any further questions/concerns.  Patient was seen, examined,treatment plan was discussed with the resident.  I have directly reviewed the clinical findings, lab, imaging studies and management of this patient in detail. I have made the necessary changes and/or additions to the above noted documentation, and agree with the documentation, as recorded by the resident.

## 2016-11-14 NOTE — ED Notes (Signed)
ER PA at bedside

## 2016-11-14 NOTE — Progress Notes (Signed)
MD returned call RE: pt's BP with new orders

## 2016-11-14 NOTE — H&P (Signed)
Chief Complaint: Nephrolitiasis Hydronephrosis  Referring Physician(s): [name]  Supervising Physician: Corrie Mckusick  Patient Status: [name] [name] - In-pt  History of Present Illness: [name] [name] is a 61 y.o. male with PMH significant for nephrolithiasis, oropharyngeal cancer s/p chemo and radiation, CKD (baseline Cr ~1.8) and HTN who presented to the ED yesterday with c/o two days of LLQ pain and N/V.   He denied hematuria, dysuria, and fevers.   CT scan shows 2 cm left UPJ obstructing stone with mild-moderate hydronephrosis.   We are asked to place a nephrostomy tube.  He does not take blood thinners.  Past Medical History:  Diagnosis Date  . Arthritis   . Depression 06/12/11   Buproprion per Dr. Lamonte Sakai  . Full dentures    Fitting Per Dr. Enrique Sack  . Hypertension   . Oropharyngeal cancer ([place]) 2012  . Protein calorie malnutrition ([place]) 05/31/11  . Renal insufficiency 2012   Secondary to Hx. of Cisplatin and Dhydrtion  . Smoking 06/12/11   1/2 pack/day  . Status post chemotherapy 10/30/10 - 11/09/10    2 doses of Q 3 week Cisplatin  . Status post radiation therapy 10/11/10 - 12/24/10   Bilateral Neck and Mucosa axis /  70 gray in 35 fractions  . Xerostomia     Past Surgical History:  Procedure Laterality Date  . APPENDECTOMY    . BIOPSY TONGUE     TonsilandBaseoftongue  . PEG TUBE REMOVAL  04/13/11    Allergies: Patient has no known allergies.  Medications: Prior to Admission medications   Medication Sig Start Date End Date Taking? Authorizing Provider  amLODipine (NORVASC) 5 MG tablet Take 1 tablet (5 mg total) by mouth daily. 08/01/15  Yes Micheline Chapman, NP  levothyroxine (SYNTHROID, LEVOTHROID) 100 MCG tablet TAKE 1 TABLET (100 MCG TOTAL) BY MOUTH DAILY BEFORE BREAKFAST. 01/05/16  Yes Micheline Chapman, NP  lisinopril (PRINIVIL,ZESTRIL) 20 MG tablet Take 1 tablet (20 mg total) by mouth daily. Patient not taking: Reported on 11/14/2016 07/29/15   Dorena Dew, FNP     Family History  Problem Relation Age of Onset  . Cancer Father 49       colon  . Cancer Sister 55       leukemia    Social History   Social History  . Marital status: Single    Spouse name: N/A  . Number of children: N/A  . Years of education: N/A   Social History Main Topics  . Smoking status: Current Every Day Smoker    Packs/day: 0.25    Years: 25.00    Types: Cigarettes  . Smokeless tobacco: Never Used     Comment: Smoking 4-5 cigarettes daily on average  . Alcohol use No  . Drug use: No  . Sexual activity: Not Asked   Other Topics Concern  . None   Social History Narrative  . None    Review of Systems: A 12 point ROS discussed  Review of Systems  Constitutional: Positive for activity change.  HENT: Negative.   Respiratory: Negative.   Cardiovascular: Negative.   Gastrointestinal: Negative.   Genitourinary: Positive for flank pain. Negative for dysuria and frequency.  Skin: Negative.   Neurological: Negative.   Hematological: Negative.   Psychiatric/Behavioral: Negative.     Vital Signs: BP (!) 206/98 (BP Location: Left Arm)   Pulse 67   Temp 97.7 F (36.5 C) (Oral)   Resp (!) 21   Ht 5\' 9"  (1.753 m)  Wt 175 lb (79.4 kg)   SpO2 94%   BMI 25.84 kg/m   Physical Exam  Constitutional: He is oriented to person, place, and time. He appears well-developed.  HENT:  Head: Normocephalic and atraumatic.  Eyes: EOM are normal.  Neck: Normal range of motion.  Cardiovascular: Normal rate, regular rhythm and normal heart sounds.   Pulmonary/Chest: Effort normal. No respiratory distress.  Abdominal: Soft. He exhibits no distension.  Musculoskeletal: Normal range of motion.  Neurological: He is alert and oriented to person, place, and time.  Skin: Skin is warm and dry.  Psychiatric: He has a normal mood and affect. His behavior is normal. Judgment and thought content normal.  Vitals reviewed.   Imaging: Ct Renal Stone  Study  Result Date: 11/14/2016 CLINICAL DATA:  Lower abdominal pain in urinary retention over the last 2 days. EXAM: CT ABDOMEN AND PELVIS WITHOUT CONTRAST TECHNIQUE: Multidetector CT imaging of the abdomen and pelvis was performed following the standard protocol without IV contrast. COMPARISON:  06/22/2015 FINDINGS: Lower chest: Normal Hepatobiliary: 5 mm cyst at the dome as seen previously. Question 2 cm low-density area in the left lobe which appears similar to the study of April 2017 and is therefore likely benign, possibly a hemangioma. Some prominence of the left lobe raises the question of early cirrhosis. No calcified gallstones. Pancreas: Normal Spleen: Normal Adrenals/Urinary Tract: Chronic bilateral adrenal prominence, possibly adrenal hyperplasia or small adenomas. No change. Right kidney shows multiple small nonobstructing stones, the largest 5 mm in the lower pole. No sign of passing stone on the right. Left kidney again demonstrates a large stone at the UPJ, maximal dimension 2.2 cm, with left hydronephrosis, renal swelling and surrounding edema. No stone in the left ureter distal to that. Foley catheter in the bladder. Bladder is thick walled. There is an enlarged prostate. Stomach/Bowel: Diverticulosis without evidence of diverticulitis at this time. No acute bowel finding. Vascular/Lymphatic: Aortic atherosclerosis. Maximal diameter of the infrarenal aorta is 3 cm. Reproductive: Otherwise negative. Prostatic hypertrophy as noted above. Other: No free fluid or air. Musculoskeletal: Normal IMPRESSION: 2.2 cm stone at the left UPJ with left hydronephrosis, renal swelling and surrounding edema. Multiple small nonobstructing stones of the right kidney. Foley catheter in a thick-walled bladder.  Enlarged prostate. Stable appearance of the liver and adrenal glands since 2017. Therefore, the findings as described above are likely benign. Aortic atherosclerosis. Recommend followup by ultrasound in 3  years. This recommendation follows ACR consensus guidelines: White Paper of the ACR Incidental Findings Committee II on Vascular Findings. [facility] Coll Radiol 2013; 10:789-794 Diverticulosis without evidence of diverticulitis. Electronically Signed   By: Nelson Chimes M.D.   On: 11/14/2016 07:48    Labs:  CBC:  Recent Labs  11/14/16 0512  WBC 12.7*  HGB 14.1  HCT 42.5  PLT 177    COAGS: No results for input(s): INR, APTT in the last 8760 hours.  BMP:  Recent Labs  11/14/16 0512  NA 138  K 3.7  CL 108  CO2 20*  GLUCOSE 108*  BUN 19  CALCIUM 8.8*  CREATININE 2.42*  GFRNONAA 27*  GFRAA 32*    LIVER FUNCTION TESTS:  Recent Labs  11/14/16 0512  BILITOT 0.5  AST 17  ALT 13*  ALKPHOS 86  PROT 7.2  ALBUMIN 3.8    TUMOR MARKERS: No results for input(s): AFPTM, CEA, CA199, CHROMGRNA in the last 8760 hours.  Assessment and Plan:  Left Hydronephrosis secondary to nephrolithiasis  Will proceed with  image guided nephrostomy tube on Monday, 11/15/17.  Risks and benefits of nephrostomy tube placement were discussed with the patient including, but not limited to, infection, bleeding, significant bleeding causing loss or decrease in renal function or damage to adjacent structures.   This procedure involves the use of X-rays and because of the nature of the planned procedure, it is possible that we will have prolonged use of X-ray fluoroscopy.  Potential radiation risks to you include (but are not limited to) the following: - A slightly elevated risk for cancer  several years later in life. This risk is typically less than 0.5% percent. This risk is low in comparison to the normal incidence of human cancer, which is 33% for women and 50% for men according to the [employer]. - Radiation induced injury can include skin redness, resembling a rash, tissue breakdown / ulcers and hair loss (which can be temporary or permanent).   The likelihood of either of these  occurring depends on the difficulty of the procedure and whether you are sensitive to radiation due to previous procedures, disease, or genetic conditions.   IF your procedure requires a prolonged use of radiation, you will be notified and given written instructions for further action.  It is your responsibility to monitor the irradiated area for the 2 weeks following the procedure and to notify your physician if you are concerned that you have suffered a radiation induced injury.     Thank you for this interesting consult.  I greatly enjoyed meeting DIONYSIOS MASSMAN and look forward to participating in their care.  A copy of this report was sent to the requesting provider on this date.  Electronically Signed: Murrell Redden, PA-C 11/14/2016, 12:09 PM   I spent a total of 20 Minutes  in face to face in clinical consultation, greater than 50% of which was counseling/coordinating care for nephrostomy tube.

## 2016-11-14 NOTE — H&P (Signed)
History and Physical  [name] [name] WGN:[id number] DOB: 14-Feb-1956 DOA: 11/14/2016  PCP:  Micheline Chapman, NP   Chief Complaint:  Left flank pain   History of Present Illness:  Pt is a 61 yo male with hx hypothyroidism, and HTN who came with cc of left flank pain for the past  2 days, non radiating, severe, associated with nausea and dysuria/nocturia w/o hematuria. No other complaints. No similar prior history. He ran out of his medications for a week as well.   Review of Systems:  CONSTITUTIONAL:     No night sweats.  No fatigue.  No fever. No chills. Eyes:                            No visual changes.  No eye pain.  No eye discharge.   ENT:                              No epistaxis.  No sinus pain.  No sore throat.   No congestion. RESPIRATORY:           No cough.  No wheeze.  No hemoptysis.  No dyspnea CARDIOVASCULAR   :  No chest pains.  No palpitations. GASTROINTESTINAL:  No abdominal pain.  No nausea. No vomiting.  No diarrhea. No constipation.  No hematemesis.  No hematochezia.  No melena. GENITOURINARY:      No urgency.  +frequency.  +dysuria.  No hematuria.  +obstructive symptoms.  No discharge.  No pain.   MUSCULOSKELETAL:  No musculoskeletal pain.  No joint swelling.  No arthritis. NEUROLOGICAL:        No confusion.  No weakness. No headache. No seizure. PSYCHIATRIC:             No depression. No anxiety. No suicidal ideation. SKIN:                             No rashes.  No lesions.  No wounds. ENDOCRINE:                No weight loss.  No polydipsia.  No polyuria.  No polyphagia. HEMATOLOGIC:           No purpura.  No petechiae.  No bleeding.  ALLERGIC                 : No pruritus.  No angioedema Other:  Past Medical and Surgical History:   Past Medical History:  Diagnosis Date  . Arthritis   . Depression 06/12/11   Buproprion per Dr. Lamonte Sakai  . Full dentures    Fitting Per Dr. Enrique Sack  . Hypertension   . Oropharyngeal cancer ([place]) 2012  . Protein  calorie malnutrition ([place]) 05/31/11  . Renal insufficiency 2012   Secondary to Hx. of Cisplatin and Dhydrtion  . Smoking 06/12/11   1/2 pack/day  . Status post chemotherapy 10/30/10 - 11/09/10    2 doses of Q 3 week Cisplatin  . Status post radiation therapy 10/11/10 - 12/24/10   Bilateral Neck and Mucosa axis /  70 gray in 35 fractions  . Xerostomia    Past Surgical History:  Procedure Laterality Date  . APPENDECTOMY    . BIOPSY TONGUE     TonsilandBaseoftongue  . PEG TUBE REMOVAL  04/13/11    Social History:   reports that he has  been smoking Cigarettes.  He has a 6.25 pack-year smoking history. He has never used smokeless tobacco. He reports that he does not drink alcohol or use drugs.   No Known Allergies  Family History  Problem Relation Age of Onset  . Cancer Father 73       colon  . Cancer Sister 67       leukemia    Prior to Admission medications   Medication Sig Start Date End Date Taking? Authorizing Provider  amLODipine (NORVASC) 5 MG tablet Take 1 tablet (5 mg total) by mouth daily. 08/01/15  Yes Micheline Chapman, NP  levothyroxine (SYNTHROID, LEVOTHROID) 100 MCG tablet TAKE 1 TABLET (100 MCG TOTAL) BY MOUTH DAILY BEFORE BREAKFAST. 01/05/16  Yes Micheline Chapman, NP  lisinopril (PRINIVIL,ZESTRIL) 20 MG tablet Take 1 tablet (20 mg total) by mouth daily. Patient not taking: Reported on 11/14/2016 07/29/15   Dorena Dew, FNP    Physical Exam: BP (!) 206/98 (BP Location: Left Arm)   Pulse 67   Temp 97.7 F (36.5 C) (Oral)   Resp (!) 21   Ht 5\' 9"  (1.753 m)   Wt 79.4 kg (175 lb)   SpO2 94%   BMI 25.84 kg/m   GENERAL :   Alert and cooperative, and appears to be in no acute distress. HEAD:           normocephalic. EYES:            PERRL, EOMI.  vision is grossly intact. EARS:           hearing grossly intact. NECK:          supple, CARDIAC:    Normal S1 and S2. No gallop. No murmurs.  Vascular:     no peripheral edema. Extremities are warm and well  perfused. No carotid bruits. LUNGS:       Clear to auscultation  ABDOMEN: Positive bowel sounds. Soft, nondistended, nontender. No guarding or rebound.      MSK:           No joint erythema or tenderness. EXT           : No significant deformity or joint abnormality. Neuro        : Alert, oriented to person, place, and time.                      CN II-XII intact.  SKIN:            No rash. No lesions. PSYCH:       No hallucination. Patient is not suicidal.          Labs on Admission:  Reviewed.   Radiological Exams on Admission: Ct Renal Stone Study  Result Date: 11/14/2016 CLINICAL DATA:  Lower abdominal pain in urinary retention over the last 2 days. EXAM: CT ABDOMEN AND PELVIS WITHOUT CONTRAST TECHNIQUE: Multidetector CT imaging of the abdomen and pelvis was performed following the standard protocol without IV contrast. COMPARISON:  06/22/2015 FINDINGS: Lower chest: Normal Hepatobiliary: 5 mm cyst at the dome as seen previously. Question 2 cm low-density area in the left lobe which appears similar to the study of April 2017 and is therefore likely benign, possibly a hemangioma. Some prominence of the left lobe raises the question of early cirrhosis. No calcified gallstones. Pancreas: Normal Spleen: Normal Adrenals/Urinary Tract: Chronic bilateral adrenal prominence, possibly adrenal hyperplasia or small adenomas. No change. Right kidney shows multiple small nonobstructing stones, the largest 5 mm  in the lower pole. No sign of passing stone on the right. Left kidney again demonstrates a large stone at the UPJ, maximal dimension 2.2 cm, with left hydronephrosis, renal swelling and surrounding edema. No stone in the left ureter distal to that. Foley catheter in the bladder. Bladder is thick walled. There is an enlarged prostate. Stomach/Bowel: Diverticulosis without evidence of diverticulitis at this time. No acute bowel finding. Vascular/Lymphatic: Aortic atherosclerosis. Maximal diameter of the  infrarenal aorta is 3 cm. Reproductive: Otherwise negative. Prostatic hypertrophy as noted above. Other: No free fluid or air. Musculoskeletal: Normal IMPRESSION: 2.2 cm stone at the left UPJ with left hydronephrosis, renal swelling and surrounding edema. Multiple small nonobstructing stones of the right kidney. Foley catheter in a thick-walled bladder.  Enlarged prostate. Stable appearance of the liver and adrenal glands since 2017. Therefore, the findings as described above are likely benign. Aortic atherosclerosis. Recommend followup by ultrasound in 3 years. This recommendation follows ACR consensus guidelines: White Paper of the ACR Incidental Findings Committee II on Vascular Findings. [facility] Coll Radiol 2013; 10:789-794 Diverticulosis without evidence of diverticulitis. Electronically Signed   By: Nelson Chimes M.D.   On: 11/14/2016 07:48      Assessment/Plan  Left flank pain:  Due to nephrolithiasis , w/o evidence of UTI in UA Urology recs to place nephrostomy tube by IR: IR consulted  Dilaudid prn pain Continue IVF  AKI on CKD:  Pre renal or post renal  Continue IVF Repeat BMP in am  Hypertensive urgency:  Due to missing medications /pain Restart norvasc Hold lisinopril for AKI Start on hydralazine  Hypothyroidism: restart synthroid  Input & Output: NA Lines & Tubes: PIV DVT prophylaxis: [place] hep GI prophylaxis: PPI Consultants: IR/ urology  Code Status: full  Family Communication: none at bedside  Disposition Plan: TBD     Gennaro Africa M.D [facility]

## 2016-11-15 ENCOUNTER — Inpatient Hospital Stay (HOSPITAL_COMMUNITY): Payer: Self-pay

## 2016-11-15 ENCOUNTER — Encounter (HOSPITAL_COMMUNITY): Payer: Self-pay | Admitting: Interventional Radiology

## 2016-11-15 DIAGNOSIS — N200 Calculus of kidney: Secondary | ICD-10-CM

## 2016-11-15 HISTORY — PX: IR NEPHROSTOMY PLACEMENT LEFT: IMG6063

## 2016-11-15 LAB — BASIC METABOLIC PANEL
ANION GAP: 11 (ref 5–15)
BUN: 15 mg/dL (ref 6–20)
CHLORIDE: 106 mmol/L (ref 101–111)
CO2: 18 mmol/L — AB (ref 22–32)
Calcium: 8.4 mg/dL — ABNORMAL LOW (ref 8.9–10.3)
Creatinine, Ser: 2.04 mg/dL — ABNORMAL HIGH (ref 0.61–1.24)
GFR calc Af Amer: 39 mL/min — ABNORMAL LOW (ref >60)
GFR calc non Af Amer: 33 mL/min — ABNORMAL LOW (ref >60)
GLUCOSE: 127 mg/dL — AB (ref 65–99)
POTASSIUM: 3.2 mmol/L — AB (ref 3.5–5.1)
Sodium: 135 mmol/L (ref 135–145)

## 2016-11-15 LAB — CBC
HEMATOCRIT: 40.7 % (ref 39.0–52.0)
HEMOGLOBIN: 13.7 g/dL (ref 13.0–17.0)
MCH: 27.8 pg (ref 26.0–34.0)
MCHC: 33.7 g/dL (ref 30.0–36.0)
MCV: 82.7 fL (ref 78.0–100.0)
Platelets: 165 10*3/uL (ref 150–400)
RBC: 4.92 MIL/uL (ref 4.22–5.81)
RDW: 15.5 % (ref 11.5–15.5)
WBC: 11.4 10*3/uL — ABNORMAL HIGH (ref 4.0–10.5)

## 2016-11-15 LAB — PROTIME-INR
INR: 1.04
Prothrombin Time: 13.5 seconds (ref 11.4–15.2)

## 2016-11-15 LAB — HIV ANTIBODY (ROUTINE TESTING W REFLEX): HIV Screen 4th Generation wRfx: NONREACTIVE

## 2016-11-15 MED ORDER — MIDAZOLAM HCL 2 MG/2ML IJ SOLN
INTRAMUSCULAR | Status: AC
  Filled 2016-11-15: qty 6

## 2016-11-15 MED ORDER — SODIUM CHLORIDE 0.9% FLUSH
5.0000 mL | Freq: Three times a day (TID) | INTRAVENOUS | Status: DC
  Administered 2016-11-15 – 2016-11-16 (×3): 5 mL via INTRAVENOUS

## 2016-11-15 MED ORDER — CEFAZOLIN SODIUM-DEXTROSE 2-4 GM/100ML-% IV SOLN
INTRAVENOUS | Status: AC
  Filled 2016-11-15: qty 100

## 2016-11-15 MED ORDER — POTASSIUM CHLORIDE CRYS ER 20 MEQ PO TBCR
40.0000 meq | EXTENDED_RELEASE_TABLET | Freq: Once | ORAL | Status: AC
  Administered 2016-11-15: 40 meq via ORAL
  Filled 2016-11-15: qty 2

## 2016-11-15 MED ORDER — LIDOCAINE HCL (PF) 2 % IJ SOLN
INTRAMUSCULAR | Status: AC
  Filled 2016-11-15: qty 20

## 2016-11-15 MED ORDER — CEFAZOLIN SODIUM-DEXTROSE 2-4 GM/100ML-% IV SOLN: 2.0000 g | Freq: Once | INTRAVENOUS | Status: DC

## 2016-11-15 MED ORDER — FENTANYL CITRATE (PF) 100 MCG/2ML IJ SOLN
INTRAMUSCULAR | Status: AC
  Filled 2016-11-15: qty 6

## 2016-11-15 MED ORDER — LIDOCAINE HCL 1 % IJ SOLN
INTRAMUSCULAR | Status: AC | PRN
  Administered 2016-11-15: 15 mL

## 2016-11-15 MED ORDER — MIDAZOLAM HCL 2 MG/2ML IJ SOLN
INTRAMUSCULAR | Status: AC | PRN
  Administered 2016-11-15 (×4): 1 mg via INTRAVENOUS

## 2016-11-15 MED ORDER — FENTANYL CITRATE (PF) 100 MCG/2ML IJ SOLN
INTRAMUSCULAR | Status: AC | PRN
  Administered 2016-11-15 (×2): 50 ug via INTRAVENOUS

## 2016-11-15 MED ORDER — AMLODIPINE BESYLATE 10 MG PO TABS
10.0000 mg | ORAL_TABLET | Freq: Every day | ORAL | Status: DC
  Administered 2016-11-16: 10 mg via ORAL
  Filled 2016-11-15: qty 1

## 2016-11-15 MED ORDER — IOPAMIDOL (ISOVUE-300) INJECTION 61%
INTRAVENOUS | Status: AC
  Administered 2016-11-15: 15 mL via INTRAVENOUS
  Filled 2016-11-15: qty 50

## 2016-11-15 MED ORDER — IOPAMIDOL (ISOVUE-300) INJECTION 61%
50.0000 mL | Freq: Once | INTRAVENOUS | Status: AC | PRN
  Administered 2016-11-15: 15 mL via INTRAVENOUS

## 2016-11-15 MED ORDER — LABETALOL HCL 5 MG/ML IV SOLN
5.0000 mg | INTRAVENOUS | Status: DC | PRN
  Filled 2016-11-15: qty 4

## 2016-11-15 MED ORDER — AMLODIPINE BESYLATE 5 MG PO TABS
5.0000 mg | ORAL_TABLET | Freq: Once | ORAL | Status: AC
Start: 2016-11-15 — End: 2016-11-15
  Administered 2016-11-15: 5 mg via ORAL
  Filled 2016-11-15: qty 1

## 2016-11-15 NOTE — Sedation Documentation (Signed)
Patient denies pain and is resting comfortably.  

## 2016-11-15 NOTE — Progress Notes (Signed)
Urology Consult Follow-up Note   Reason for Consult:  Nephrolithiasis   Assessment: 61 y.o. male with a 2.1cm L UPJ stone with obstruction. Cr 2.4 on admission from baseline ~1.8. No evidence of infection. Will need L percutaneous nephrostolithotomy (PCNL) in the coming weeks, but in the meantime will have L PCN placed by VIR for drainage.   I discussed with him today the procedure of PCNL. We discussed the fact that a tube would be placed within the left kidney during his admission and I would then arrange for surgery as an outpatient. I discussed the procedure in detail including the risks and complications, the probability of success, the need for an overnight stay and the anticipated postoperative course. I told him my goal would be to remove all of the stone however there is a possibility of the need for a second procedure either percutaneously or ureteroscopically. We also discussed the possible need for a nephrostomy tube on discharge versus a stent.I have answered his questions and he understands and has elected to proceed.  Recommendations: - Ok to remove Foley.  - Start Flomax 0.4mg  daily for urinary retention - Will set up PCNL in the near future.  Leave nephrostomy tube to drain through discharge.  - Urology will continue to be available. Will need to be scheduled for PCNL in the coming weeks.   Subjective: He reports persistent mild to moderate left flank pain.  Vital signs in last 24 hours: BP (!) 180/87 (BP Location: Right Arm)   Pulse 97   Temp 97.9 F (36.6 C) (Oral)   Resp 20   Ht 5\' 9"  (1.753 m)   Wt 81.8 kg (180 lb 5.4 oz)   SpO2 95%   BMI 26.63 kg/m   Physical Exam General: NAD, A&O, resting, appropriate HEENT: [place]/AT, EOMI, MMM Pulmonary: Normal work of breathing Cardiovascular: HDS, adequate peripheral perfusion Abdomen: Soft, TTP over LLQ, nondistended. GU: Foley in place draining clear yellow urine, no CVA tenderness Extremities: warm and well  perfused Neuro: Appropriate, no focal neurological deficits  Most Recent Labs: Lab Results  Component Value Date   WBC 11.4 (H) 11/15/2016   HGB 13.7 11/15/2016   HCT 40.7 11/15/2016   PLT 165 11/15/2016    Lab Results  Component Value Date   NA 135 11/15/2016   K 3.2 (L) 11/15/2016   CL 106 11/15/2016   CO2 18 (L) 11/15/2016   BUN 15 11/15/2016   CREATININE 2.04 (H) 11/15/2016   CALCIUM 8.4 (L) 11/15/2016   MG 1.9 11/06/2010     IMAGING: Ct Renal Stone Study: 11/14/2016 IMPRESSION: 2.2 cm stone at the left UPJ with left hydronephrosis, renal swelling and surrounding edema. Multiple small nonobstructing stones of the right kidney. Foley catheter in a thick-walled bladder.  Enlarged prostate. Stable appearance of the liver and adrenal glands since 2017. Therefore, the findings as described above are likely benign. Aortic atherosclerosis. Recommend followup by ultrasound in 3 years. This recommendation follows ACR consensus guidelines: White Paper of the ACR Incidental Findings Committee II on Vascular Findings. [facility] Coll Radiol 2013; 10:789-794 Diverticulosis without evidence of diverticulitis. Electronically Signed   By: Nelson Chimes M.D.   On: 11/14/2016 07:48  CT scan images were independently reviewed  Patient was seen, examined,treatment plan was discussed with the resident.  I have directly reviewed the clinical findings, lab, imaging studies and management of this patient in detail. I have made the necessary changes and/or additions to the above noted documentation, and agree with  the documentation, as recorded by the resident.

## 2016-11-15 NOTE — Progress Notes (Signed)
Received patient to room from IR, Left nephrostomy tube intact, VS obtained

## 2016-11-15 NOTE — Procedures (Signed)
Interventional Radiology Procedure Note  Procedure: Left percutaneous nephrostomy  Complications: None  Estimated Blood Loss: < 10 mL  US shows minimal hydronephrosis today.  After several attempts, able to gain access to collecting system via mid collecting system and able to place 10 Fr PCN. Due to pelvic stone, PCN will not form and was left partially formed in collecting sytem and just extending into proximal ureter.  Connected to gravity bag.  Venetia Night. Kathlene Cote, M.D Pager:  [phone]

## 2016-11-15 NOTE — Discharge Instructions (Signed)
Moderate Conscious Sedation, Adult, Care After  These instructions provide you with information about caring for yourself after your procedure. Your health care provider may also give you more specific instructions. Your treatment has been planned according to current medical practices, but problems sometimes occur. Call your health care provider if you have any problems or questions after your procedure.  What can I expect after the procedure?  After your procedure, it is common:   To feel sleepy for several hours.   To feel clumsy and have poor balance for several hours.   To have poor judgment for several hours.   To vomit if you eat too soon.    Follow these instructions at home:  For at least 24 hours after the procedure:     Do not:  ? Participate in activities where you could fall or become injured.  ? Drive.  ? Use heavy machinery.  ? Drink alcohol.  ? Take sleeping pills or medicines that cause drowsiness.  ? Make important decisions or sign legal documents.  ? Take care of children on your own.   Rest.  Eating and drinking   Follow the diet recommended by your health care provider.   If you vomit:  ? Drink water, juice, or soup when you can drink without vomiting.  ? Make sure you have little or no nausea before eating solid foods.  General instructions   Have a responsible adult stay with you until you are awake and alert.   Take over-the-counter and prescription medicines only as told by your health care provider.   If you smoke, do not smoke without supervision.   Keep all follow-up visits as told by your health care provider. This is important.  Contact a health care provider if:   You keep feeling nauseous or you keep vomiting.   You feel light-headed.   You develop a rash.   You have a fever.  Get help right away if:   You have trouble breathing.  This information is not intended to replace advice given to you by your health care provider. Make sure you discuss any questions you have  with your health care provider.  Document Released: 12/06/2012 Document Revised: 07/21/2015 Document Reviewed: 06/07/2015  [employer]vier Interactive Patient Education  2018 [employer]

## 2016-11-15 NOTE — Progress Notes (Addendum)
PROGRESS NOTE    [name] [name]  JEH:[id number] DOB: 1955/12/22 DOA: 11/14/2016 PCP: Micheline Chapman, NP     Brief Narrative:  [name] [name] is a 61 yo male with history of hypertension and hypothyroidism, CKD stage III, presented to the hospital with chief complaint of left flank pain for the past 2 days without radiation, associated with nausea and dysuria. Workup revealed left 2.2 cm nephrolithiasis with left hydronephrosis. Urology and IR were consulted for further treatment recommendation  Assessment & Plan:   Active Problems:   Nephrolithiasis  Left hydronephrosis with nephrolithiasis -Urology consulted recommended left perc nephrostomy in patient and perc nephrostolithotomy as outpatient  -IR consulted, s/p left perc nephrostomy 9/17   Hypertensive urgency -Ran out of home medications and has not taken in weeks  -Norvasc, hydralazine. Labetalol prn   AKI on CKD stage 3 -Baseline Cr around 1.4  -Improving   Hypokalemia -Replace, trend   Hypothyroidism -Synthroid   Aortic atherosclerosis -Recommend followup by ultrasound in 3 years    DVT prophylaxis: subq hep  Code Status: Full Family Communication: no family at bedside Disposition Plan: Pending improvement, home tmrw if stable   Consultants:   Urology  IR  Procedures:   S/p left perc nephrostomy 9/17   Antimicrobials:  Anti-infectives    Start     Dose/Rate Route Frequency Ordered Stop   11/15/16 1100  ceFAZolin (ANCEF) IVPB 2g/100 mL premix  Status:  Discontinued     2 g 200 mL/hr over 30 Minutes Intravenous  Once 11/15/16 1050 11/15/16 1053   11/15/16 1047  ceFAZolin (ANCEF) 2-4 GM/100ML-% IVPB  Status:  Discontinued    Comments:  Covington, Jamie   : cabinet override      11/15/16 1047 11/15/16 1056   11/15/16 0800  ciprofloxacin (CIPRO) IVPB 400 mg    Comments:  Give in IR for surgical prophylaxis   400 mg 200 mL/hr over 60 Minutes Intravenous  Once 11/14/16 1055 11/15/16 0859        Subjective: Feeling well this morning, denies any flank pain on my examination. Has Foley in place. Denies any fevers or chills, chest pain or shortness of breath. No further nausea or vomiting. Denies any headache.  Objective: Vitals:   11/15/16 1145 11/15/16 1150 11/15/16 1155 11/15/16 1213  BP: 140/87 (!) 153/90 (!) 145/89 (!) 142/91  Pulse: (!) 102 97 (!) 101 99  Resp: (!) 21 (!) 22 (!) 23 20  Temp:    98.6 F (37 C)  TempSrc:    Oral  SpO2: 95% 100% 100% 95%  Weight:      Height:        Intake/Output Summary (Last 24 hours) at 11/15/16 1235 Last data filed at 11/15/16 1234  Gross per 24 hour  Intake          2510.41 ml  Output             4200 ml  Net         -1689.59 ml   Filed Weights   11/14/16 0439 11/14/16 1334  Weight: 79.4 kg (175 lb) 81.8 kg (180 lb 5.4 oz)    Examination:  General exam: Appears calm and comfortable  Respiratory system: Clear to auscultation. Respiratory effort normal. Cardiovascular system: S1 & S2 heard, Tachycardic, regular rhythm. No JVD, murmurs, rubs, gallops or clicks. No pedal edema. Gastrointestinal system: Abdomen is nondistended, soft and nontender. No organomegaly or masses felt. Normal bowel sounds heard. Central nervous system: Alert and oriented.  No focal neurological deficits. Extremities: Symmetric 5 x 5 power. Skin: No rashes, lesions or ulcers Psychiatry: Judgement and insight appear normal. Mood & affect appropriate.   Data Reviewed: I have personally reviewed following labs and imaging studies  CBC:  Recent Labs Lab 11/14/16 0512 11/15/16 0454  WBC 12.7* 11.4*  HGB 14.1 13.7  HCT 42.5 40.7  MCV 84.2 82.7  PLT 177 160   Basic Metabolic Panel:  Recent Labs Lab 11/14/16 0512 11/15/16 0454  NA 138 135  K 3.7 3.2*  CL 108 106  CO2 20* 18*  GLUCOSE 108* 127*  BUN 19 15  CREATININE 2.42* 2.04*  CALCIUM 8.8* 8.4*   GFR: Estimated Creatinine Clearance: 38 mL/min (A) (by C-G formula based on SCr of  2.04 mg/dL (H)). Liver Function Tests:  Recent Labs Lab 11/14/16 0512  AST 17  ALT 13*  ALKPHOS 86  BILITOT 0.5  PROT 7.2  ALBUMIN 3.8    Recent Labs Lab 11/14/16 0512  LIPASE 37   No results for input(s): AMMONIA in the last 168 hours. Coagulation Profile:  Recent Labs Lab 11/15/16 0454  INR 1.04   Cardiac Enzymes: No results for input(s): CKTOTAL, CKMB, CKMBINDEX, TROPONINI in the last 168 hours. BNP (last 3 results) No results for input(s): PROBNP in the last 8760 hours. HbA1C: No results for input(s): HGBA1C in the last 72 hours. CBG: No results for input(s): GLUCAP in the last 168 hours. Lipid Profile: No results for input(s): CHOL, HDL, LDLCALC, TRIG, CHOLHDL, LDLDIRECT in the last 72 hours. Thyroid Function Tests: No results for input(s): TSH, T4TOTAL, FREET4, T3FREE, THYROIDAB in the last 72 hours. Anemia Panel: No results for input(s): VITAMINB12, FOLATE, FERRITIN, TIBC, IRON, RETICCTPCT in the last 72 hours. Sepsis Labs: No results for input(s): PROCALCITON, LATICACIDVEN in the last 168 hours.  No results found for this or any previous visit (from the past 240 hour(s)).     Radiology Studies: Ct Renal Stone Study  Result Date: 11/14/2016 CLINICAL DATA:  Lower abdominal pain in urinary retention over the last 2 days. EXAM: CT ABDOMEN AND PELVIS WITHOUT CONTRAST TECHNIQUE: Multidetector CT imaging of the abdomen and pelvis was performed following the standard protocol without IV contrast. COMPARISON:  06/22/2015 FINDINGS: Lower chest: Normal Hepatobiliary: 5 mm cyst at the dome as seen previously. Question 2 cm low-density area in the left lobe which appears similar to the study of April 2017 and is therefore likely benign, possibly a hemangioma. Some prominence of the left lobe raises the question of early cirrhosis. No calcified gallstones. Pancreas: Normal Spleen: Normal Adrenals/Urinary Tract: Chronic bilateral adrenal prominence, possibly adrenal  hyperplasia or small adenomas. No change. Right kidney shows multiple small nonobstructing stones, the largest 5 mm in the lower pole. No sign of passing stone on the right. Left kidney again demonstrates a large stone at the UPJ, maximal dimension 2.2 cm, with left hydronephrosis, renal swelling and surrounding edema. No stone in the left ureter distal to that. Foley catheter in the bladder. Bladder is thick walled. There is an enlarged prostate. Stomach/Bowel: Diverticulosis without evidence of diverticulitis at this time. No acute bowel finding. Vascular/Lymphatic: Aortic atherosclerosis. Maximal diameter of the infrarenal aorta is 3 cm. Reproductive: Otherwise negative. Prostatic hypertrophy as noted above. Other: No free fluid or air. Musculoskeletal: Normal IMPRESSION: 2.2 cm stone at the left UPJ with left hydronephrosis, renal swelling and surrounding edema. Multiple small nonobstructing stones of the right kidney. Foley catheter in a thick-walled bladder.  Enlarged prostate. Stable  appearance of the liver and adrenal glands since 2017. Therefore, the findings as described above are likely benign. Aortic atherosclerosis. Recommend followup by ultrasound in 3 years. This recommendation follows ACR consensus guidelines: White Paper of the ACR Incidental Findings Committee II on Vascular Findings. [facility] Radiol 2013; 10:789-794 Diverticulosis without evidence of diverticulitis. Electronically Signed   By: Nelson Chimes M.D.   On: 11/14/2016 07:48      Scheduled Meds: . [START ON 11/16/2016] amLODipine  10 mg Oral Daily  . fentaNYL      . heparin  5,000 Units Subcutaneous Q8H  . hydrALAZINE  75 mg Oral Q8H  . Influenza vac split quadrivalent PF  0.5 mL Intramuscular Tomorrow-1000  . levothyroxine  100 mcg Oral QAC breakfast  . lidocaine      . midazolam      . pantoprazole  40 mg Oral Daily  . sodium chloride flush  5 mL Intravenous Q8H   Continuous Infusions:    LOS: 1 day    Time spent:  40 minutes   Dessa Phi, DO [facility] [email or web address] Password [facility] 11/15/2016, 12:35 PM

## 2016-11-16 LAB — BASIC METABOLIC PANEL
Anion gap: 11 (ref 5–15)
BUN: 21 mg/dL — AB (ref 6–20)
CHLORIDE: 106 mmol/L (ref 101–111)
CO2: 19 mmol/L — AB (ref 22–32)
CREATININE: 2.02 mg/dL — AB (ref 0.61–1.24)
Calcium: 8.9 mg/dL (ref 8.9–10.3)
GFR calc non Af Amer: 34 mL/min — ABNORMAL LOW (ref >60)
GFR, EST AFRICAN AMERICAN: 39 mL/min — AB (ref >60)
Glucose, Bld: 108 mg/dL — ABNORMAL HIGH (ref 65–99)
Potassium: 3.7 mmol/L (ref 3.5–5.1)
Sodium: 136 mmol/L (ref 135–145)

## 2016-11-16 LAB — CBC
HEMATOCRIT: 42.1 % (ref 39.0–52.0)
HEMOGLOBIN: 14.2 g/dL (ref 13.0–17.0)
MCH: 28.1 pg (ref 26.0–34.0)
MCHC: 33.7 g/dL (ref 30.0–36.0)
MCV: 83.2 fL (ref 78.0–100.0)
Platelets: 200 10*3/uL (ref 150–400)
RBC: 5.06 MIL/uL (ref 4.22–5.81)
RDW: 16 % — ABNORMAL HIGH (ref 11.5–15.5)
WBC: 12.1 10*3/uL — ABNORMAL HIGH (ref 4.0–10.5)

## 2016-11-16 MED ORDER — LEVOTHYROXINE SODIUM 100 MCG PO TABS: 100.0000 ug | ORAL_TABLET | Freq: Every day | ORAL | 0 refills | Status: DC

## 2016-11-16 MED ORDER — AMLODIPINE BESYLATE 10 MG PO TABS: 10.0000 mg | ORAL_TABLET | Freq: Every day | ORAL | 0 refills | Status: DC

## 2016-11-16 MED ORDER — METOPROLOL TARTRATE 25 MG PO TABS: 12.5000 mg | ORAL_TABLET | Freq: Two times a day (BID) | ORAL | 0 refills | Status: DC

## 2016-11-16 MED ORDER — METOPROLOL TARTRATE 25 MG PO TABS
12.5000 mg | ORAL_TABLET | Freq: Two times a day (BID) | ORAL | Status: DC
  Administered 2016-11-16: 12.5 mg via ORAL
  Filled 2016-11-16: qty 1

## 2016-11-16 MED ORDER — HYDRALAZINE HCL 25 MG PO TABS: 75.0000 mg | ORAL_TABLET | Freq: Three times a day (TID) | ORAL | 0 refills | Status: DC

## 2016-11-16 NOTE — Progress Notes (Signed)
Patient ID: [name] [name], male   DOB: 08-06-1955, 61 y.o.   MRN: [id number]    Assessment: Left UPJ stone: He had a percutaneous nephrostomy tube placed in the left kidney yesterday.  It is draining clear urine today.  I have made arrangements to schedule him for his PCNL as an outpatient.  Plan:  1.  Currently being scheduled for outpatient PCNL. 2.  Could be discharged from urologic standpoint.    Subjective: Patient reports no complaints.  Objective: Vital signs in last 24 hours: Temp:  [97.8 F (36.6 C)-98.6 F (37 C)] 98.6 F (37 C) (09/18 0527) Pulse Rate:  [78-107] 86 (09/18 0527) Resp:  [17-25] 20 (09/18 0527) BP: (140-182)/(74-99) 161/74 (09/18 0527) SpO2:  [94 %-100 %] 94 % (09/18 0527)A  Intake/Output from previous day: 09/17 0701 - 09/18 0700 In: 470.4 [I.V.:460.4] Out: 3225 [Urine:3225] Intake/Output this shift: No intake/output data recorded.  Past Medical History:  Diagnosis Date  . Arthritis   . Depression 06/12/11   Buproprion per Dr. Lamonte Sakai  . Full dentures    Fitting Per Dr. Enrique Sack  . Hypertension   . 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 Protein calorie malnutrition ([place]) 05/31/11  . Renal insufficiency 2012   Secondary to Hx. of Cisplatin and Dhydrtion  . Smoking 06/12/11   1/2 pack/day  . Status post chemotherapy 10/30/10 - 11/09/10    2 doses of Q 3 week Cisplatin  . Status post radiation therapy 10/11/10 - 12/24/10   Bilateral Neck and Mucosa axis /  70 gray in 35 fractions  . Xerostomia     Physical Exam:  Lungs - Normal respiratory effort, chest expands symmetrically.  Abdomen - Soft, non-tender & non-distended.  Lab Results:  Recent Labs  11/14/16 0512 11/15/16 0454 11/16/16 0455  WBC 12.7* 11.4* 12.1*  HGB 14.1 13.7 14.2  HCT 42.5 40.7 42.1   BMET  Recent Labs  11/15/16 0454 11/16/16 0455  NA 135 136  K 3.2* 3.7  CL 106 106  CO2 18* 19*  GLUCOSE 127* 108*  BUN 15 21*  CREATININE 2.04* 2.02*  CALCIUM 8.4* 8.9   No  results for input(s): LABURIN in the last 72 hours. Results for orders placed or performed in visit on 11/27/10  TECHNOLOGIST REVIEW     Status: None   Collection Time: 11/27/10  9:30 AM  Result Value Ref Range Status   Technologist Review Few [place] , Myelocytes and variant lymphs present  Final    Studies/Results: Ir Nephrostomy Placement Left  Result Date: 11/15/2016 CLINICAL DATA:  Left-sided hydronephrosis secondary to calculus in the renal pelvis. EXAM: 1. ULTRASOUND GUIDANCE FOR PUNCTURE OF THE LEFT RENAL COLLECTING SYSTEM. 2. LEFT PERCUTANEOUS NEPHROSTOMY TUBE PLACEMENT. COMPARISON:  CT of the abdomen and pelvis on 11/14/2016 ANESTHESIA/SEDATION: 4.0 mg IV Versed; 100 mcg IV Fentanyl. Total Moderate Sedation Time 44 minutes. The patient's level of consciousness and physiologic status were continuously monitored during the procedure by Radiology nursing. CONTRAST:  10 mL Isovue-300 MEDICATIONS: 400 mg IV Cipro. Antibiotic was administered in an appropriate time frame prior to skin puncture. FLUOROSCOPY TIME:  2 minutes and 30 seconds.  42.3 mGy. PROCEDURE: The procedure, risks, benefits, and alternatives were explained to the patient. Questions regarding the procedure were encouraged and answered. The patient understands and consents to the procedure. A time-out was performed prior to initiating the procedure. The left flank region was prepped with chlorhexidine in a sterile fashion, and a sterile drape was applied covering the operative field.  A sterile gown and sterile gloves were used for the procedure. Local anesthesia was provided with 1% Lidocaine. Ultrasound was used to localize the left kidney. Under direct ultrasound guidance, a 21 gauge needle was advanced into the renal collecting system. Ultrasound image documentation was performed. Aspiration of urine sample was performed followed by contrast injection. A transitional dilator was advanced over a guidewire. Percutaneous tract dilatation  was then performed over the guidewire. A 10 -French percutaneous nephrostomy tube was then advanced and formed in the collecting system. Catheter position was confirmed by fluoroscopy after contrast injection. The catheter was secured at the skin with a Prolene retention suture and Stat-Lock device. A gravity bag was placed. COMPLICATIONS: None. FINDINGS: By ultrasound, hydronephrosis seen yesterday by CT has largely resolved with no significant hydronephrosis present. This made it difficult to access the renal collecting system with multiple attempts performed utilizing 21 gauge needles. Ultimately, access was gained via a lower pole infundibulum. Fluoroscopy demonstrates a large calculus occupying the renal pelvis and extending into the lower pole infundibulum. After placement of the nephrostomy tube, the tube could not be completely performed due to the presence of the pelvic calculus. The tube was advanced partially into the proximal ureter. IMPRESSION: Placement of left-sided 10 French percutaneous nephrostomy tube. Since CT yesterday, hydronephrosis has largely resolved which made it difficult to access the renal collecting system. A nephrostomy tube was placed via lower infundibular access and extends into the proximal ureter. A large calculus occupies the renal pelvis and extends into the lower pole infundibulum. Electronically Signed   By: Aletta Edouard M.D.   On: 11/15/2016 12:35      [name] 11/16/2016, 7:26 AM

## 2016-11-16 NOTE — Discharge Summary (Signed)
Physician Discharge Summary  [name] [name] NLG:[id number] DOB: 03/29/1955 DOA: 11/14/2016  PCP: Scot Jun, FNP  Admit date: 11/14/2016 Discharge date: 11/16/2016  Admitted From: Home Disposition:  Home  Recommendations for Outpatient Follow-up:  1. Follow up with PCP in 1 week 2. Need BP control and repeat BMP prior to resuming lisinopril  3. Follow up with urology. They will arrange for outpatient PCNL 4. Please obtain BMP/CBC in 1 week   Home Health: RN  Equipment/Devices: None   Discharge Condition: Stable CODE STATUS: Full  Diet recommendation: Heart healthy   Brief/Interim Summary: [name] [name] is a 61 yo male with history of hypertension and hypothyroidism, CKD stage III, presented to the hospital with chief complaint of left flank pain for the past 2 days without radiation, associated with nausea and dysuria. Workup revealed left 2.2 cm nephrolithiasis with left hydronephrosis. Urology and IR were consulted for further treatment recommendation. Patient underwent left percutaneous nephrostomy on 9/17.  Discharge Diagnoses:  Active Problems:   Nephrolithiasis  Left hydronephrosis with nephrolithiasis -Urology consulted recommended left perc nephrostomy in patient and perc nephrostolithotomy as outpatient  -IR consulted, s/p left perc nephrostomy 9/17  -Patient will need to follow up with [facility] for outpatient PCNL  Hypertensive urgency -Ran out of home medications and has not taken in weeks  -Norvasc, hydralazin, metoprolol. Holding lisinopril in setting of AKI  -Discussed with patient that it is very important that he continues to treat his elevated blood pressure to avoid complications such as heart attack or stroke. Discussed with him that if he runs out of his medications, he needs to call his primary care physician.  AKI on CKD stage 3 -Baseline Cr around 1.4  -Improving, continue to monitor as outpatient. Creatinine is 2 at time of discharge.  Holding lisinopril in setting of AKI   Hypothyroidism -Continue Synthroid   Aortic atherosclerosis -Recommend followup by ultrasound in 3 years   Discharge Instructions  Discharge Instructions    Call MD for:  difficulty breathing, headache or visual disturbances    Complete by:  As directed    Call MD for:  extreme fatigue    Complete by:  As directed    Call MD for:  hives    Complete by:  As directed    Call MD for:  persistant dizziness or light-headedness    Complete by:  As directed    Call MD for:  persistant nausea and vomiting    Complete by:  As directed    Call MD for:  severe uncontrolled pain    Complete by:  As directed    Call MD for:  temperature >100.4    Complete by:  As directed    Diet - low sodium heart healthy    Complete by:  As directed    Discharge instructions    Complete by:  As directed    You were cared for by a hospitalist during your hospital stay. If you have any questions about your discharge medications or the care you received while you were in the hospital after you are discharged, you can call the unit and asked to speak with the hospitalist on call if the hospitalist that took care of you is not available. Once you are discharged, your primary care physician will handle any further medical issues. Please note that NO REFILLS for any discharge medications will be authorized once you are discharged, as it is imperative that you return to your primary care physician (  or establish a relationship with a primary care physician if you do not have one) for your aftercare needs so that they can reassess your need for medications and monitor your lab values.   Increase activity slowly    Complete by:  As directed      Allergies as of 11/16/2016   No Known Allergies     Medication List    STOP taking these medications   lisinopril 20 MG tablet Commonly known as:  PRINIVIL,ZESTRIL     TAKE these medications   amLODipine 10 MG tablet Commonly  known as:  NORVASC Take 1 tablet (10 mg total) by mouth daily. What changed:  medication strength  how much to take   hydrALAZINE 25 MG tablet Commonly known as:  APRESOLINE Take 3 tablets (75 mg total) by mouth every 8 (eight) hours.   levothyroxine 100 MCG tablet Commonly known as:  SYNTHROID, LEVOTHROID Take 1 tablet (100 mcg total) by mouth daily before breakfast.   metoprolol tartrate 25 MG tablet Commonly known as:  LOPRESSOR Take 0.5 tablets (12.5 mg total) by mouth 2 (two) times daily.            Discharge Care Instructions        Start     Ordered   11/16/16 0000  amLODipine (NORVASC) 10 MG tablet  Daily     11/16/16 1108   11/16/16 0000  hydrALAZINE (APRESOLINE) 25 MG tablet  Every 8 hours     11/16/16 1108   11/16/16 0000  levothyroxine (SYNTHROID, LEVOTHROID) 100 MCG tablet  Daily before breakfast     11/16/16 1108   11/16/16 0000  metoprolol tartrate (LOPRESSOR) 25 MG tablet  2 times daily     11/16/16 1108   11/16/16 0000  Increase activity slowly     11/16/16 1108   11/16/16 0000  Diet - low sodium heart healthy     11/16/16 1108   11/16/16 0000  Discharge instructions    Comments:  You were cared for by a hospitalist during your hospital stay. If you have any questions about your discharge medications or the care you received while you were in the hospital after you are discharged, you can call the unit and asked to speak with the hospitalist on call if the hospitalist that took care of you is not available. Once you are discharged, your primary care physician will handle any further medical issues. Please note that NO REFILLS for any discharge medications will be authorized once you are discharged, as it is imperative that you return to your primary care physician (or establish a relationship with a primary care physician if you do not have one) for your aftercare needs so that they can reassess your need for medications and monitor your lab values.    11/16/16 1108   11/16/16 0000  Call MD for:  temperature >100.4     11/16/16 1108   11/16/16 0000  Call MD for:  extreme fatigue     11/16/16 1108   11/16/16 0000  Call MD for:  persistant dizziness or light-headedness     11/16/16 1108   11/16/16 0000  Call MD for:  hives     11/16/16 1108   11/16/16 0000  Call MD for:  difficulty breathing, headache or visual disturbances     11/16/16 1108   11/16/16 0000  Call MD for:  severe uncontrolled pain     11/16/16 1108   11/16/16 0000  Call MD for:  persistant  nausea and vomiting     11/16/16 1108     Follow-up [facility] Follow up on 11/22/2016.   Specialty:  Internal Medicine Why:  Appointment at 10 AM. Please keep appointment. Can purchase medications at [facility]. Contact information: [place] [phone]         No Known Allergies  Consultations:  Urology  IR    Procedures/Studies: Ct Renal Stone Study  Result Date: 11/14/2016 CLINICAL DATA:  Lower abdominal pain in urinary retention over the last 2 days. EXAM: CT ABDOMEN AND PELVIS WITHOUT CONTRAST TECHNIQUE: Multidetector CT imaging of the abdomen and pelvis was performed following the standard protocol without IV contrast. COMPARISON:  06/22/2015 FINDINGS: Lower chest: Normal Hepatobiliary: 5 mm cyst at the dome as seen previously. Question 2 cm low-density area in the left lobe which appears similar to the study of April 2017 and is therefore likely benign, possibly a hemangioma. Some prominence of the left lobe raises the question of early cirrhosis. No calcified gallstones. Pancreas: Normal Spleen: Normal Adrenals/Urinary Tract: Chronic bilateral adrenal prominence, possibly adrenal hyperplasia or small adenomas. No change. Right kidney shows multiple small nonobstructing stones, the largest 5 mm in the lower pole. No sign of passing stone on the right. Left kidney again  demonstrates a large stone at the UPJ, maximal dimension 2.2 cm, with left hydronephrosis, renal swelling and surrounding edema. No stone in the left ureter distal to that. Foley catheter in the bladder. Bladder is thick walled. There is an enlarged prostate. Stomach/Bowel: Diverticulosis without evidence of diverticulitis at this time. No acute bowel finding. Vascular/Lymphatic: Aortic atherosclerosis. Maximal diameter of the infrarenal aorta is 3 cm. Reproductive: Otherwise negative. Prostatic hypertrophy as noted above. Other: No free fluid or air. Musculoskeletal: Normal IMPRESSION: 2.2 cm stone at the left UPJ with left hydronephrosis, renal swelling and surrounding edema. Multiple small nonobstructing stones of the right kidney. Foley catheter in a thick-walled bladder.  Enlarged prostate. Stable appearance of the liver and adrenal glands since 2017. Therefore, the findings as described above are likely benign. Aortic atherosclerosis. Recommend followup by ultrasound in 3 years. This recommendation follows ACR consensus guidelines: White Paper of the ACR Incidental Findings Committee II on Vascular Findings. Natasha Mead Coll Radiol 2013; 10:789-794 Diverticulosis without evidence of diverticulitis. Electronically Signed   By: Nelson Chimes M.D.   On: 11/14/2016 07:48   Ir Nephrostomy Placement Left  Result Date: 11/15/2016 CLINICAL DATA:  Left-sided hydronephrosis secondary to calculus in the renal pelvis. EXAM: 1. ULTRASOUND GUIDANCE FOR PUNCTURE OF THE LEFT RENAL COLLECTING SYSTEM. 2. LEFT PERCUTANEOUS NEPHROSTOMY TUBE PLACEMENT. COMPARISON:  CT of the abdomen and pelvis on 11/14/2016 ANESTHESIA/SEDATION: 4.0 mg IV Versed; 100 mcg IV Fentanyl. Total Moderate Sedation Time 44 minutes. The patient's level of consciousness and physiologic status were continuously monitored during the procedure by Radiology nursing. CONTRAST:  10 mL Isovue-300 MEDICATIONS: 400 mg IV Cipro. Antibiotic was administered in an  appropriate time frame prior to skin puncture. FLUOROSCOPY TIME:  2 minutes and 30 seconds.  42.3 mGy. PROCEDURE: The procedure, risks, benefits, and alternatives were explained to the patient. Questions regarding the procedure were encouraged and answered. The patient understands and consents to the procedure. A time-out was performed prior to initiating the procedure. The left flank region was prepped with chlorhexidine in a sterile fashion, and a sterile drape was applied covering the operative field. A sterile gown and  sterile gloves were used for the procedure. Local anesthesia was provided with 1% Lidocaine. Ultrasound was used to localize the left kidney. Under direct ultrasound guidance, a 21 gauge needle was advanced into the renal collecting system. Ultrasound image documentation was performed. Aspiration of urine sample was performed followed by contrast injection. A transitional dilator was advanced over a guidewire. Percutaneous tract dilatation was then performed over the guidewire. A 10 -French percutaneous nephrostomy tube was then advanced and formed in the collecting system. Catheter position was confirmed by fluoroscopy after contrast injection. The catheter was secured at the skin with a Prolene retention suture and Stat-Lock device. A gravity bag was placed. COMPLICATIONS: None. FINDINGS: By ultrasound, hydronephrosis seen yesterday by CT has largely resolved with no significant hydronephrosis present. This made it difficult to access the renal collecting system with multiple attempts performed utilizing 21 gauge needles. Ultimately, access was gained via a lower pole infundibulum. Fluoroscopy demonstrates a large calculus occupying the renal pelvis and extending into the lower pole infundibulum. After placement of the nephrostomy tube, the tube could not be completely performed due to the presence of the pelvic calculus. The tube was advanced partially into the proximal ureter. IMPRESSION:  Placement of left-sided 10 French percutaneous nephrostomy tube. Since CT yesterday, hydronephrosis has largely resolved which made it difficult to access the renal collecting system. A nephrostomy tube was placed via lower infundibular access and extends into the proximal ureter. A large calculus occupies the renal pelvis and extends into the lower pole infundibulum. Electronically Signed   By: Aletta Edouard M.D.   On: 11/15/2016 12:35      Discharge Exam: Vitals:   11/15/16 2039 11/16/16 0527  BP: (!) 182/98 (!) 161/74  Pulse: 100 86  Resp: 20 20  Temp: 97.9 F (36.6 C) 98.6 F (37 C)  SpO2: 96% 94%   Vitals:   11/15/16 1319 11/15/16 1356 11/15/16 2039 11/16/16 0527  BP: (!) 145/86 (!) 157/96 (!) 182/98 (!) 161/74  Pulse: 88 99 100 86  Resp: 18 20 20 20   Temp: 98.3 F (36.8 C) 97.8 F (36.6 C) 97.9 F (36.6 C) 98.6 F (37 C)  TempSrc: Oral Oral Oral Oral  SpO2: 97% 100% 96% 94%  Weight:      Height:        General: Pt is alert, awake, not in acute distress Cardiovascular: RRR, S1/S2 +, no rubs, no gallops Respiratory: CTA bilaterally, no wheezing, no rhonchi Abdominal: Soft, NT, ND, bowel sounds + Extremities: no edema, no cyanosis    The results of significant diagnostics from this hospitalization (including imaging, microbiology, ancillary and laboratory) are listed below for reference.     Microbiology: No results found for this or any previous visit (from the past 240 hour(s)).   Labs: BNP (last 3 results) No results for input(s): BNP in the last 8760 hours. Basic Metabolic Panel:  Recent Labs Lab 11/14/16 0512 11/15/16 0454 11/16/16 0455  NA 138 135 136  K 3.7 3.2* 3.7  CL 108 106 106  CO2 20* 18* 19*  GLUCOSE 108* 127* 108*  BUN 19 15 21*  CREATININE 2.42* 2.04* 2.02*  CALCIUM 8.8* 8.4* 8.9   Liver Function Tests:  Recent Labs Lab 11/14/16 0512  AST 17  ALT 13*  ALKPHOS 86  BILITOT 0.5  PROT 7.2  ALBUMIN 3.8    Recent Labs Lab  11/14/16 0512  LIPASE 37   No results for input(s): AMMONIA in the last 168 hours. CBC:  Recent Labs  Lab 11/14/16 0512 11/15/16 0454 11/16/16 0455  WBC 12.7* 11.4* 12.1*  HGB 14.1 13.7 14.2  HCT 42.5 40.7 42.1  MCV 84.2 82.7 83.2  PLT 177 165 200   Cardiac Enzymes: No results for input(s): CKTOTAL, CKMB, CKMBINDEX, TROPONINI in the last 168 hours. BNP: Invalid input(s): POCBNP CBG: No results for input(s): GLUCAP in the last 168 hours. D-Dimer No results for input(s): DDIMER in the last 72 hours. Hgb A1c No results for input(s): HGBA1C in the last 72 hours. Lipid Profile No results for input(s): CHOL, HDL, LDLCALC, TRIG, CHOLHDL, LDLDIRECT in the last 72 hours. Thyroid function studies No results for input(s): TSH, T4TOTAL, T3FREE, THYROIDAB in the last 72 hours.  Invalid input(s): FREET3 Anemia work up No results for input(s): VITAMINB12, FOLATE, FERRITIN, TIBC, IRON, RETICCTPCT in the last 72 hours. Urinalysis    Component Value Date/Time   COLORURINE YELLOW 11/14/2016 0511   APPEARANCEUR CLEAR 11/14/2016 0511   LABSPEC 1.015 11/14/2016 0511   PHURINE 7.0 11/14/2016 0511   GLUCOSEU NEGATIVE 11/14/2016 0511   HGBUR LARGE (A) 11/14/2016 0511   BILIRUBINUR NEGATIVE 11/14/2016 0511   KETONESUR NEGATIVE 11/14/2016 0511   PROTEINUR TRACE (A) 11/14/2016 0511   NITRITE NEGATIVE 11/14/2016 0511   LEUKOCYTESUR NEGATIVE 11/14/2016 0511   Sepsis Labs Invalid input(s): PROCALCITONIN,  WBC,  LACTICIDVEN Microbiology No results found for this or any previous visit (from the past 240 hour(s)).   Time coordinating discharge: 40 minutes  SIGNED:  Dessa Phi, DO [facility] Pager [phone]  If 7PM-7AM, please contact night-coverage [email or web address] Password [facility] 11/16/2016, 2:39 PM

## 2016-11-22 ENCOUNTER — Encounter: Payer: Self-pay | Admitting: Family Medicine

## 2016-11-22 ENCOUNTER — Ambulatory Visit (INDEPENDENT_AMBULATORY_CARE_PROVIDER_SITE_OTHER): Payer: Self-pay | Admitting: Family Medicine

## 2016-11-22 VITALS — BP 145/77 | HR 64 | Temp 97.9°F | Resp 16 | Ht 69.0 in | Wt 179.0 lb

## 2016-11-22 DIAGNOSIS — I10 Essential (primary) hypertension: Secondary | ICD-10-CM

## 2016-11-22 DIAGNOSIS — R809 Proteinuria, unspecified: Secondary | ICD-10-CM

## 2016-11-22 DIAGNOSIS — N183 Chronic kidney disease, stage 3 unspecified: Secondary | ICD-10-CM

## 2016-11-22 DIAGNOSIS — Z936 Other artificial openings of urinary tract status: Secondary | ICD-10-CM

## 2016-11-22 DIAGNOSIS — E878 Other disorders of electrolyte and fluid balance, not elsewhere classified: Secondary | ICD-10-CM

## 2016-11-22 DIAGNOSIS — E039 Hypothyroidism, unspecified: Secondary | ICD-10-CM

## 2016-11-22 DIAGNOSIS — N200 Calculus of kidney: Secondary | ICD-10-CM

## 2016-11-22 LAB — POCT URINALYSIS DIP (DEVICE)
BILIRUBIN URINE: NEGATIVE
GLUCOSE, UA: NEGATIVE mg/dL
KETONES UR: NEGATIVE mg/dL
Nitrite: NEGATIVE
PH: 5.5 (ref 5.0–8.0)
Specific Gravity, Urine: 1.025 (ref 1.005–1.030)
Urobilinogen, UA: 0.2 mg/dL (ref 0.0–1.0)

## 2016-11-22 MED ORDER — LEVOTHYROXINE SODIUM 100 MCG PO TABS: 100.0000 ug | ORAL_TABLET | Freq: Every day | ORAL | 6 refills | Status: DC

## 2016-11-22 MED ORDER — METOPROLOL TARTRATE 25 MG PO TABS: 12.5000 mg | ORAL_TABLET | Freq: Two times a day (BID) | ORAL | 3 refills | Status: DC

## 2016-11-22 MED ORDER — HYDRALAZINE HCL 25 MG PO TABS: 75.0000 mg | ORAL_TABLET | Freq: Three times a day (TID) | ORAL | 4 refills | Status: DC

## 2016-11-22 MED ORDER — AMLODIPINE BESYLATE 10 MG PO TABS: 10.0000 mg | ORAL_TABLET | Freq: Every day | ORAL | 6 refills | Status: DC

## 2016-11-22 MED ORDER — ACETAMINOPHEN-CODEINE #3 300-30 MG PO TABS: 1.0000 | ORAL_TABLET | Freq: Four times a day (QID) | ORAL | 0 refills | Status: DC | PRN

## 2016-11-22 NOTE — Progress Notes (Signed)
Patient ID: [name] [name], male    DOB: Oct 13, 1955, 61 y.o.   MRN: [id number]  PCP: Scot Jun, FNP  Chief Complaint  Patient presents with  . Hospitalization Follow-up    Subjective:  HPI [name] [name] is a 61 y.o. male presents for hospital follow-up and re-establish care. [name] [name] was previously a patient here at the [facility] and was lost to follow-up more than 1 year ago. Medical history significant for current everyday smoker Hypertension, chronic kidney disease-3, hypothyroidism, and throat cancer. [name] [name] was recently hospitalized for left flank pain and found to have 2.2 cm renal stone. He underwent a left kidney  nephrostomy 11/15/2016 and reports clear, continuous urine outflow. He is followed by [facility] and has an outpatient follow-up scheduled for October 4th. He denies abdominal pain or  fever. Reports more intermittent flank pain which is exacerbated by lying down.  Social History   Social History  . Marital status: Single    Spouse name: N/A  . Number of children: N/A  . Years of education: N/A   Occupational History  . Not on file.   Social History Main Topics  . Smoking status: Current Every Day Smoker    Packs/day: 0.25    Years: 25.00    Types: Cigarettes  . Smokeless tobacco: Never Used     Comment: Smoking 4-5 cigarettes daily on average  . Alcohol use No  . Drug use: No  . Sexual activity: Not on file   Other Topics Concern  . Not on file   Social History Narrative  . No narrative on file    Family History  Problem Relation Age of Onset  . Cancer Father 76       colon  . Cancer Sister 35       leukemia   Review of Systems See HPI Patient Active Problem List   Diagnosis Date Noted  . Nephrolithiasis 11/14/2016  . Hypothyroidism 06/23/2015  . Noncompliance with follow up and medications 06/23/2015  . Acute kidney injury superimposed on chronic kidney disease ([place]) 06/22/2015  . Left nephrolithiasis 06/22/2015  .  Hydronephrosis, left 06/22/2015  . Acute diverticulitis 06/22/2015  . 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 Renal insufficiency     No Known Allergies  Prior to Admission medications   Medication Sig Start Date End Date Taking? Authorizing Provider  amLODipine (NORVASC) 10 MG tablet Take 1 tablet (10 mg total) by mouth daily. 11/16/16  Yes Dessa Phi Chahn-Yang, DO  hydrALAZINE (APRESOLINE) 25 MG tablet Take 3 tablets (75 mg total) by mouth every 8 (eight) hours. 11/16/16 12/16/16 Yes Dessa Phi Chahn-Yang, DO  levothyroxine (SYNTHROID, LEVOTHROID) 100 MCG tablet Take 1 tablet (100 mcg total) by mouth daily before breakfast. 11/16/16 12/16/16 Yes Dessa Phi Chahn-Yang, DO  metoprolol tartrate (LOPRESSOR) 25 MG tablet Take 0.5 tablets (12.5 mg total) by mouth 2 (two) times daily. 11/16/16  Yes Shon Millet, DO    Past Medical, Surgical Family and Social History reviewed and updated.    Objective:   Vitals:   11/22/16 0950  BP: (!) 145/77  Pulse: 64  Resp: 16  Temp: 97.9 F (36.6 C)  SpO2: 98%     Wt Readings from Last 3 Encounters:  11/22/16 179 lb (81.2 kg)  11/14/16 180 lb 5.4  oz (81.8 kg)  08/01/15 173 lb (78.5 kg)   Physical Exam  Constitutional: He is oriented to person, place, and time. He appears well-developed and well-nourished.  HENT:  Head: Normocephalic and atraumatic.  Neck: Normal range of motion. Neck supple.  Cardiovascular: Normal rate, regular rhythm, normal heart sounds and intact distal pulses.   Pulmonary/Chest: Effort normal and breath sounds normal.  Abdominal: Soft. Bowel sounds are normal. He exhibits no distension and no mass. There is no tenderness. There is no rebound and no guarding.  Genitourinary:  Genitourinary Comments: Nephrostomy tube  and collection bag in place left flank. Urine output clear, negative of  hematuria   Musculoskeletal: Normal range of motion.  Neurological: He is alert and oriented to person, place, and time.  Skin: Skin is warm and dry.  Psychiatric: He has a normal mood and affect. His behavior is normal. Judgment and thought content normal.    Assessment & Plan:  1. Renal stone-continue follow-up with [facility] , tx pain with Tylenol #3 2. Nephrostomy status ([place]), patent, continue urology and IR follow-up 3. Electrolyte imbalance, recheck BMP 4. Proteinuria, unspecified type, check microalbumin, hx CKD III 5. Stage 3 chronic kidney disease (HCC)-continue to monitor and avoid nephrotoxic agents. Rechecking renal function today. 6. Hypothyroidism, unspecified type, continue current regimen. Recheck TSH at follow-up visit. 7. Essential hypertension, uncontrolled today. Refilled patient's anti-hypertension  medications. At follow-up advised to take all medications prior to office visit in order to properly evaluate BP.   Orders Placed This Encounter  Procedures  . CBC with Differential  . Basic metabolic panel  . Microalbumin/Creatinine Ratio, Urine  . POCT urinalysis dip (device)    Meds ordered this encounter  Medications  . DISCONTD: amLODipine (NORVASC) 10 MG tablet    Sig: Take 1 tablet (10 mg total) by mouth daily.    Dispense:  30 tablet    Refill:  6    Order Specific Question:   Supervising Provider    Answer:   Tresa Garter [id number]  . DISCONTD: hydrALAZINE (APRESOLINE) 25 MG tablet    Sig: Take 3 tablets (75 mg total) by mouth every 8 (eight) hours.    Dispense:  270 tablet    Refill:  4    Order Specific Question:   Supervising Provider    Answer:   Tresa Garter [id number]  . DISCONTD: levothyroxine (SYNTHROID, LEVOTHROID) 100 MCG tablet    Sig: Take 1 tablet (100 mcg total) by mouth daily before breakfast.    Dispense:  30 tablet    Refill:  6    Order  Specific Question:   Supervising Provider    Answer:   Tresa Garter [id number]  . DISCONTD: metoprolol tartrate (LOPRESSOR) 25 MG tablet    Sig: Take 0.5 tablets (12.5 mg total) by mouth 2 (two) times daily.    Dispense:  60 tablet    Refill:  3    Order Specific Question:   Supervising Provider    Answer:   Tresa Garter [id number]  . acetaminophen-codeine (TYLENOL #3) 300-30 MG tablet    Sig: Take 1 tablet by mouth every 6 (six) hours as needed for moderate pain.    Dispense:  30 tablet    Refill:  0    Order Specific Question:   Supervising Provider    Answer:   Tresa Garter [id number]  . amLODipine (NORVASC) 10 MG tablet    Sig: Take 1 tablet (10 mg total) by mouth daily.  Dispense:  30 tablet    Refill:  6    Patient will pick up when needed    Order Specific Question:   Supervising Provider    Answer:   Tresa Garter [[id number]]  . hydrALAZINE (APRESOLINE) 25 MG tablet    Sig: Take 3 tablets (75 mg total) by mouth every 8 (eight) hours.    Dispense:  270 tablet    Refill:  4    Patient will pick up when needed    Order Specific Question:   Supervising Provider    Answer:   Tresa Garter [[id number]]  . levothyroxine (SYNTHROID, LEVOTHROID) 100 MCG tablet    Sig: Take 1 tablet (100 mcg total) by mouth daily before breakfast.    Dispense:  30 tablet    Refill:  6    Patient will pick up when needed    Order Specific Question:   Supervising Provider    Answer:   Tresa Garter [[id number]]  . metoprolol tartrate (LOPRESSOR) 25 MG tablet    Sig: Take 0.5 tablets (12.5 mg total) by mouth 2 (two) times daily.    Dispense:  60 tablet    Refill:  3    Patient will pick up when needed    Order Specific Question:   Supervising Provider    Answer:   Tresa Garter [[id number]]     RTC: Recheck renal function, thyroid function, and blood pressure   Carroll Sage. [name], MSN, FNP-C [facility]  [address] [place], [place] [address] [phone]

## 2016-11-23 LAB — CBC WITH DIFFERENTIAL/PLATELET
Basophils Absolute: 86 cells/uL (ref 0–200)
Basophils Relative: 0.7 %
Eosinophils Absolute: 234 cells/uL (ref 15–500)
Eosinophils Relative: 1.9 %
HCT: 40.6 % (ref 38.5–50.0)
Hemoglobin: 13.3 g/dL (ref 13.2–17.1)
Lymphs Abs: 1144 cells/uL (ref 850–3900)
MCH: 27.7 pg (ref 27.0–33.0)
MCHC: 32.8 g/dL (ref 32.0–36.0)
MCV: 84.6 fL (ref 80.0–100.0)
MPV: 11.5 fL (ref 7.5–12.5)
Monocytes Relative: 7.1 %
NEUTROS PCT: 81 %
Neutro Abs: 9963 cells/uL — ABNORMAL HIGH (ref 1500–7800)
PLATELETS: 245 10*3/uL (ref 140–400)
RBC: 4.8 10*6/uL (ref 4.20–5.80)
RDW: 14.9 % (ref 11.0–15.0)
TOTAL LYMPHOCYTE: 9.3 %
WBC: 12.3 10*3/uL — AB (ref 3.8–10.8)
WBCMIX: 873 {cells}/uL (ref 200–950)

## 2016-11-23 LAB — BASIC METABOLIC PANEL
BUN/Creatinine Ratio: 14 (calc) (ref 6–22)
BUN: 21 mg/dL (ref 7–25)
CALCIUM: 8.6 mg/dL (ref 8.6–10.3)
CHLORIDE: 109 mmol/L (ref 98–110)
CO2: 18 mmol/L — AB (ref 20–32)
CREATININE: 1.54 mg/dL — AB (ref 0.70–1.25)
Glucose, Bld: 106 mg/dL — ABNORMAL HIGH (ref 65–99)
Potassium: 3.6 mmol/L (ref 3.5–5.3)
Sodium: 140 mmol/L (ref 135–146)

## 2016-11-23 LAB — MICROALBUMIN / CREATININE URINE RATIO
Creatinine, Urine: 137 mg/dL (ref 20–320)
MICROALB/CREAT RATIO: 774 ug/mg{creat} — AB (ref <30)
Microalb, Ur: 106.1 mg/dL

## 2016-11-24 ENCOUNTER — Other Ambulatory Visit: Payer: Self-pay | Admitting: Urology

## 2016-12-14 ENCOUNTER — Telehealth: Payer: Self-pay

## 2016-12-14 MED ORDER — AMLODIPINE BESYLATE 10 MG PO TABS: 10.0000 mg | ORAL_TABLET | Freq: Every day | ORAL | 6 refills | Status: DC

## 2016-12-14 MED ORDER — LEVOTHYROXINE SODIUM 100 MCG PO TABS: 100.0000 ug | ORAL_TABLET | Freq: Every day | ORAL | 6 refills | Status: DC

## 2016-12-16 NOTE — Patient Instructions (Addendum)
[name] [name]  12/16/2016   Your procedure is scheduled on: 12-20-16   Report to [facility] Main  Entrance   Take [place]  elevators to 3rd floor to  [place] at 5:30 AM.   Call this number if you have problems the morning of surgery [phone]    Remember: ONLY 1 PERSON MAY GO WITH YOU TO SHORT STAY TO GET  READY MORNING OF [place].  Do not eat food or drink liquids :After Midnight.     Take these medicines the morning of surgery with A SIP OF WATER: Amlodipine (Norvasc), Levothyroxine (Synthroid), and Metoprolol (Lopressor), hydralazine(apresoline)                                 You may not have any metal on your body including hair pins and              piercings  Do not wear jewelry, lotions, powders , deodorant                          Men may shave face and neck.   Do not bring valuables to the hospital. [place].  Contacts, dentures or bridgework may not be worn into surgery.  Leave suitcase in the car. You may bring it to your room after surgery .                    Please read over the following fact sheets you were given: _____________________________________________________________________             [facility] - Preparing for Surgery Before surgery, you can play an important role.  Because skin is not sterile, your skin needs to be as free of germs as possible.  You can reduce the number of germs on your skin by washing with CHG (chlorahexidine gluconate) soap before surgery.  CHG is an antiseptic cleaner which kills germs and bonds with the skin to continue killing germs even after washing. Please DO NOT use if you have an allergy to CHG or antibacterial soaps.  If your skin becomes reddened/irritated stop using the CHG and inform your nurse when you arrive at Short Stay. Do not shave (including legs and underarms) for at least 48 hours prior to the first CHG shower.  You  may shave your face/neck. Please follow these instructions carefully:  1.  Shower with CHG Soap the night before surgery and the  morning of Surgery.  2.  If you choose to wash your hair, wash your hair first as usual with your  normal  shampoo.  3.  After you shampoo, rinse your hair and body thoroughly to remove the  shampoo.                           4.  Use CHG as you would any other liquid soap.  You can apply chg directly  to the skin and wash                       Gently with a scrungie or clean washcloth.  5.  Apply the CHG Soap to  your body ONLY FROM THE NECK DOWN.   Do not use on face/ open                           Wound or open sores. Avoid contact with eyes, ears mouth and genitals (private parts).                       Wash face,  Genitals (private parts) with your normal soap.             6.  Wash thoroughly, paying special attention to the area where your surgery  will be performed.  7.  Thoroughly rinse your body with warm water from the neck down.  8.  DO NOT shower/wash with your normal soap after using and rinsing off  the CHG Soap.                9.  Pat yourself dry with a clean towel.            10.  Wear clean pajamas.            11.  Place clean sheets on your bed the night of your first shower and do not  sleep with pets. Day of Surgery : Do not apply any lotions/deodorants the morning of surgery.  Please wear clean clothes to the hospital/surgery center.  FAILURE TO FOLLOW THESE INSTRUCTIONS MAY RESULT IN THE CANCELLATION OF YOUR SURGERY PATIENT SIGNATURE_________________________________  NURSE SIGNATURE__________________________________  ________________________________________________________________________

## 2016-12-17 ENCOUNTER — Encounter (HOSPITAL_COMMUNITY): Payer: Self-pay

## 2016-12-17 ENCOUNTER — Encounter (INDEPENDENT_AMBULATORY_CARE_PROVIDER_SITE_OTHER): Payer: Self-pay

## 2016-12-17 ENCOUNTER — Encounter (HOSPITAL_COMMUNITY)
Admission: RE | Admit: 2016-12-17 | Discharge: 2016-12-17 | Disposition: A | Discharge status: Home / Self Care | Payer: No Typology Code available for payment source | Source: Ambulatory Visit | Attending: Urology | Admitting: Urology

## 2016-12-17 DIAGNOSIS — R9431 Abnormal electrocardiogram [ECG] [EKG]: Secondary | ICD-10-CM | POA: Insufficient documentation

## 2016-12-17 DIAGNOSIS — I10 Essential (primary) hypertension: Secondary | ICD-10-CM | POA: Insufficient documentation

## 2016-12-17 DIAGNOSIS — Z01818 Encounter for other preprocedural examination: Secondary | ICD-10-CM | POA: Insufficient documentation

## 2016-12-17 DIAGNOSIS — N200 Calculus of kidney: Secondary | ICD-10-CM | POA: Insufficient documentation

## 2016-12-17 DIAGNOSIS — Z01810 Encounter for preprocedural cardiovascular examination: Secondary | ICD-10-CM | POA: Insufficient documentation

## 2016-12-17 LAB — BASIC METABOLIC PANEL
Anion gap: 9 (ref 5–15)
BUN: 18 mg/dL (ref 6–20)
CHLORIDE: 112 mmol/L — AB (ref 101–111)
CO2: 22 mmol/L (ref 22–32)
CREATININE: 1.51 mg/dL — AB (ref 0.61–1.24)
Calcium: 9.1 mg/dL (ref 8.9–10.3)
GFR, EST AFRICAN AMERICAN: 56 mL/min — AB (ref >60)
GFR, EST NON AFRICAN AMERICAN: 48 mL/min — AB (ref >60)
Glucose, Bld: 83 mg/dL (ref 65–99)
Potassium: 4.1 mmol/L (ref 3.5–5.1)
SODIUM: 143 mmol/L (ref 135–145)

## 2016-12-17 LAB — CBC
HCT: 36.4 % — ABNORMAL LOW (ref 39.0–52.0)
HEMOGLOBIN: 11.8 g/dL — AB (ref 13.0–17.0)
MCH: 27.4 pg (ref 26.0–34.0)
MCHC: 32.4 g/dL (ref 30.0–36.0)
MCV: 84.5 fL (ref 78.0–100.0)
PLATELETS: 213 10*3/uL (ref 150–400)
RBC: 4.31 MIL/uL (ref 4.22–5.81)
RDW: 15.7 % — ABNORMAL HIGH (ref 11.5–15.5)
WBC: 9.3 10*3/uL (ref 4.0–10.5)

## 2016-12-17 NOTE — Progress Notes (Signed)
RN spoke face to face with anesthesiologist Dr Kalman Shan and had him review patient EKG done today with most recent EKG . Per Dr Kalman Shan , due to changes in T waves , patient needs cardiology office visit and clearance before proceeding with surgery . RN relayed anesthesia recommendation to patient. Patient questioning what this means for his surgery. RN explained it is difficult to say but will contact surgeon office to update and will insist they reach out to him before Monday. Patient verbalized understanding.   RN called and LVMM for [name] [name] to relay anesthesia recommendation. RN will F/U PRN.

## 2016-12-17 NOTE — Progress Notes (Signed)
Bmp routed via epic to dr Karsten Ro

## 2016-12-19 MED ORDER — SODIUM CHLORIDE 0.9 % IV SOLN
1.0000 g | INTRAVENOUS | Status: DC
  Filled 2016-12-19: qty 1000

## 2016-12-19 MED ORDER — DEXTROSE 5 % IV SOLN
2.5000 mg/kg | INTRAVENOUS | Status: DC
  Filled 2016-12-19: qty 5

## 2016-12-20 ENCOUNTER — Ambulatory Visit (HOSPITAL_COMMUNITY)
Admission: RE | Admit: 2016-12-20 | Payer: No Typology Code available for payment source | Source: Ambulatory Visit | Admitting: Urology

## 2016-12-20 ENCOUNTER — Encounter (HOSPITAL_COMMUNITY): Payer: Self-pay | Admitting: Registered Nurse

## 2016-12-20 ENCOUNTER — Encounter (HOSPITAL_COMMUNITY): Admission: RE | Payer: Self-pay | Source: Ambulatory Visit

## 2016-12-20 SURGERY — NEPHROLITHOTOMY PERCUTANEOUS
Anesthesia: General | Laterality: Left

## 2016-12-20 MED ORDER — PROPOFOL 10 MG/ML IV BOLUS
INTRAVENOUS | Status: AC
  Filled 2016-12-20: qty 20

## 2016-12-20 MED ORDER — ROCURONIUM BROMIDE 50 MG/5ML IV SOSY
PREFILLED_SYRINGE | INTRAVENOUS | Status: AC
  Filled 2016-12-20: qty 5

## 2016-12-20 MED ORDER — ONDANSETRON HCL 4 MG/2ML IJ SOLN
INTRAMUSCULAR | Status: AC
  Filled 2016-12-20: qty 2

## 2016-12-20 MED ORDER — FENTANYL CITRATE (PF) 100 MCG/2ML IJ SOLN
INTRAMUSCULAR | Status: AC
  Filled 2016-12-20: qty 2

## 2016-12-20 MED ORDER — LIDOCAINE 2% (20 MG/ML) 5 ML SYRINGE
INTRAMUSCULAR | Status: AC
  Filled 2016-12-20: qty 5

## 2016-12-21 ENCOUNTER — Ambulatory Visit (INDEPENDENT_AMBULATORY_CARE_PROVIDER_SITE_OTHER): Payer: No Typology Code available for payment source | Admitting: Cardiovascular Disease

## 2016-12-21 ENCOUNTER — Encounter: Payer: Self-pay | Admitting: Cardiovascular Disease

## 2016-12-21 ENCOUNTER — Encounter (INDEPENDENT_AMBULATORY_CARE_PROVIDER_SITE_OTHER): Payer: Self-pay

## 2016-12-21 VITALS — BP 106/64 | HR 63 | Ht 69.0 in | Wt 172.0 lb

## 2016-12-21 DIAGNOSIS — R9431 Abnormal electrocardiogram [ECG] [EKG]: Secondary | ICD-10-CM

## 2016-12-21 DIAGNOSIS — R5383 Other fatigue: Secondary | ICD-10-CM

## 2016-12-21 DIAGNOSIS — Z01818 Encounter for other preprocedural examination: Secondary | ICD-10-CM

## 2016-12-21 DIAGNOSIS — Z85819 Personal history of malignant neoplasm of unspecified site of lip, oral cavity, and pharynx: Secondary | ICD-10-CM

## 2016-12-21 DIAGNOSIS — N200 Calculus of kidney: Secondary | ICD-10-CM

## 2016-12-21 DIAGNOSIS — Z720 Tobacco use: Secondary | ICD-10-CM

## 2016-12-21 DIAGNOSIS — Z79899 Other long term (current) drug therapy: Secondary | ICD-10-CM

## 2016-12-21 DIAGNOSIS — E785 Hyperlipidemia, unspecified: Secondary | ICD-10-CM

## 2016-12-21 NOTE — Patient Instructions (Signed)
Medication Instructions:  Continue current medications  If you need a refill on your cardiac medications before your next appointment, please call your pharmacy.  Labwork: CBC, CMP, TSH, fasting Lipids   Testing/Procedures: Your physician has requested that you have an echocardiogram. Echocardiography is a painless test that uses sound waves to create images of your heart. It provides your doctor with information about the size and shape of your heart and how well your heart's chambers and valves are working. This procedure takes approximately one hour. There are no restrictions for this procedure.  Your physician has requested that you have en exercise stress myoview. For further information please visit [email or web address]. Please follow instruction sheet, as given.   Follow-Up: Your physician wants you to follow-up in: After Test.    Thank you for choosing [facility] at [facility]!!

## 2016-12-21 NOTE — Progress Notes (Signed)
Cardiology Office Note    Date:  12/28/2016   ID:  [name] [name], DOB 11-Mar-1955, MRN [id number]  PCP:  Scot Jun, FNP  Cardiologist:  Shelva Majestic, MD   Chief Complaint  Patient presents with  . New Patient (Initial Visit)    needs surgical clearance for uncoming surgery    Cardiology consultation, referred through the courtesy of Dr. Karsten Ro for preoperative clearance following demonstration of an abnormal ECG.  History of Present Illness:  [name] [name] is a 61 y.o. male who is referred for preoperative clearance by Dr. Karsten Ro prior to undergoing urologic surgery.  [name] [name] has a remote history of throat cancer and underwent chemotherapy and radiation therapy.  He has a history of hypertension, as well as long-standing tobaccouse.  He started smoking at age 43 and was smoking up to 3 packs per day for many years.  Recently, he is smoking one half pack per day.  He has a history of hypothyroidism and is on levothyroxine.  He has a history of hypertension, and most recently has been on amlodipine 10 mg, hydralazine 75 mg every 8 hours, and metoprolol 12.5 mg twice a day.  He denies any recent definitive chest pain but he does admit that he is unable to walk much due to right hip discomfort.  September, he been hospitalized with left flank pain.  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  He underwent left percutaneous nephrostomy.  He is in need for additional surgery was initially scheduled to be October 22, but when he presented on October 19 for preoperative clearance his surgery was canceled after his ECG revealed anterolateral T-wave changes.  Cardiology consultation is now requested for preoperative clearance.   Past Medical History:  Diagnosis Date  . Arthritis   . Depression 06/12/11   Buproprion per Dr. Lamonte Sakai  . Full dentures    Fitting Per Dr. Enrique Sack  . Hypertension   . 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 Protein calorie malnutrition ([place])  05/31/11  . Renal insufficiency 2012   Secondary to Hx. of Cisplatin and Dhydrtion  . Smoking 06/12/11   1/2 pack/day  . Status post chemotherapy 10/30/10 - 11/09/10    2 doses of Q 3 week Cisplatin  . Status post radiation therapy 10/11/10 - 12/24/10   Bilateral Neck and Mucosa axis /  70 gray in 35 fractions  . Xerostomia     Past Surgical History:  Procedure Laterality Date  . APPENDECTOMY    . BIOPSY TONGUE     TonsilandBaseoftongue  . IR NEPHROSTOMY PLACEMENT LEFT  11/15/2016  . PEG TUBE REMOVAL  04/13/11    Current Medications: Outpatient Medications Prior to Visit  Medication Sig Dispense Refill  . amLODipine (NORVASC) 10 MG tablet Take 1 tablet (10 mg total) by mouth daily. 30 tablet 6  . hydrALAZINE (APRESOLINE) 25 MG tablet Take 3 tablets (75 mg total) by mouth every 8 (eight) hours. 270 tablet 4  . levothyroxine (SYNTHROID, LEVOTHROID) 100 MCG tablet Take 1 tablet (100 mcg total) by mouth daily before breakfast. 30 tablet 6  . metoprolol tartrate (LOPRESSOR) 25 MG tablet Take 0.5 tablets (12.5 mg total) by mouth 2 (two) times daily. 60 tablet 3  . nitrofurantoin, macrocrystal-monohydrate, (MACROBID) 100 MG capsule Take 100 mg by mouth 2 (two) times daily.    Marland Kitchen acetaminophen-codeine (TYLENOL #3) 300-30 MG tablet Take 1 tablet by mouth every 6 (six) hours as needed for moderate pain. (Patient not taking:  Reported on 12/10/2016) 30 tablet 0   No facility-administered medications prior to visit.      Allergies:   Codeine   Social History   Social History  . Marital status: Single    Spouse name: N/A  . Number of children: N/A  . Years of education: N/A   Social History Main Topics  . Smoking status: Current Every Day Smoker    Packs/day: 0.25    Years: 25.00    Types: Cigarettes  . Smokeless tobacco: Never Used     Comment: Smoking 4-5 cigarettes daily on average  . Alcohol use No  . Drug use: No  . Sexual activity: Not Asked   Other Topics Concern  . None    Social History Narrative  . None    Additional social history is notable that he is divorced and lives with one of his daughters.  He has 3 children and 6 grandchildren.  He worked for [employer] but is retired.there is a long-standing tobacco history since age 49. He does not exercise.  Family History:  The patient's family history includes Cancer (age of onset: 33) in his sister; Cancer (age of onset: 8) in his father.  His mother died at age 66.  Father died at age 57 and had diabetes and CAD.  He has 2 brothers, 2 living sisters, and one sister who died at age 3 with some cancer of her blood.  He has 3 children.  ROS General: Negative; No fevers, chills, or night sweats;  HEENT: Negative; No changes in vision or hearing, sinus congestion, difficulty swallowing Pulmonary: Negative; No cough, wheezing, shortness of breath, hemoptysis Cardiovascular: Negative; No chest pain, presyncope, syncope, palpitations GI: Negative; No nausea, vomiting, diarrhea, or abdominal pain GU: recent left-sided flank pain, kidney stones, and hydronephrosis Musculoskeletal: hip discomfort Hematologic/Oncology: Negative; no easy bruising, bleeding Endocrine: Negative; no heat/cold intolerance; no diabetes Neuro: Negative; no changes in balance, headaches Skin: Negative; No rashes or skin lesions Psychiatric: Negative; No behavioral problems, depression Sleep: Negative; No snoring, daytime sleepiness, hypersomnolence, bruxism, restless legs, hypnogognic hallucinations, no cataplexy Other comprehensive 14 point system review is negative.   PHYSICAL EXAM:   VS:  BP 106/64   Pulse 63   Ht 5' 9"  (1.753 m)   Wt 172 lb (78 kg)   BMI 25.40 kg/m     Wt Readings from Last 3 Encounters:  12/21/16 172 lb (78 kg)  12/17/16 174 lb 12.8 oz (79.3 kg)  11/22/16 179 lb (81.2 kg)    General: Alert, oriented, no distress.  Skin: normal turgor, no rashes, warm and dry HEENT: Normocephalic, atraumatic.  Pupils equal round and reactive to light; sclera anicteric; extraocular muscles intact; Fundi no hemorrhages or exudates.  Mild arterial narrowing. Nose without nasal septal hypertrophy Mouth/Parynx benign; Mallinpatti scale 3 Neck: No JVD, no carotid bruits; normal carotid upstroke Lungs: decreased breath sounds without wheezing; Chest wall: without tenderness to palpitation Heart: PMI not displaced, RRR, s1 s2 normal, 1/6 systolic murmur, no diastolic murmur, no rubs, gallops, thrills, or heaves Abdomen: soft, nontender; no hepatosplenomehaly, BS+; abdominal aorta nontender and not dilated by palpation. Back: no CVA tenderness Pulses 2+ Musculoskeletal: full range of motion, normal strength, no joint deformities Extremities: no clubbing cyanosis or edema, Homan's sign negative  Neurologic: grossly nonfocal; Cranial nerves grossly wnl Psychologic: Normal mood and affect   Studies/Labs Reviewed:   EKG:  EKG is ordered today.  ECG (independently read by me): Normal sinus rhythm at 63 bpm.  T-wave abnormality anteriorly V1 through V5.  Normal intervals.  No ectopy.  Recent Labs: BMP Latest Ref Rng & Units 12/17/2016 11/22/2016 11/16/2016  Glucose 65 - 99 mg/dL 83 106(H) 108(H)  BUN 6 - 20 mg/dL 18 21 21(H)  Creatinine 0.61 - 1.24 mg/dL 1.51(H) 1.54(H) 2.02(H)  BUN/Creat Ratio 6 - 22 (calc) - 14 -  Sodium 135 - 145 mmol/L 143 140 136  Potassium 3.5 - 5.1 mmol/L 4.1 3.6 3.7  Chloride 101 - 111 mmol/L 112(H) 109 106  CO2 22 - 32 mmol/L 22 18(L) 19(L)  Calcium 8.9 - 10.3 mg/dL 9.1 8.6 8.9     Hepatic Function Latest Ref Rng & Units 11/14/2016 07/18/2015 06/22/2015  Total Protein 6.5 - 8.1 g/dL 7.2 6.5 7.2  Albumin 3.5 - 5.0 g/dL 3.8 3.9 3.8  AST 15 - 41 U/L 17 9(L) 16  ALT 17 - 63 U/L 13(L) 9 14(L)  Alk Phosphatase 38 - 126 U/L 86 81 90  Total Bilirubin 0.3 - 1.2 mg/dL 0.5 0.3 0.5    CBC Latest Ref Rng & Units 12/17/2016 11/22/2016 11/16/2016  WBC 4.0 - 10.5 K/uL 9.3 12.3(H) 12.1(H)   Hemoglobin 13.0 - 17.0 g/dL 11.8(L) 13.3 14.2  Hematocrit 39.0 - 52.0 % 36.4(L) 40.6 42.1  Platelets 150 - 400 K/uL 213 245 200   Lab Results  Component Value Date   MCV 84.5 12/17/2016   MCV 84.6 11/22/2016   MCV 83.2 11/16/2016   Lab Results  Component Value Date   TSH 1.83 07/18/2015   No results found for: HGBA1C   BNP No results found for: BNP  ProBNP No results found for: PROBNP   Lipid Panel     Component Value Date/Time   CHOL 154 07/18/2015 1005   TRIG 182 (H) 07/18/2015 1005   HDL 20 (L) 07/18/2015 1005   CHOLHDL 7.7 (H) 07/18/2015 1005   VLDL 36 (H) 07/18/2015 1005   LDLCALC 98 07/18/2015 1005     RADIOLOGY: No results found.   Additional studies/ records that were reviewed today include:  I reviewed his recent ossicle is a she from September 2018    ASSESSMENT:    1. Pre-operative clearance   2. Abnormal electrocardiogram   3. Hyperlipidemia, unspecified hyperlipidemia type   4. Other fatigue   5. Medication management   6. Nephrolithiasis   7. History of throat cancer   8. Tobacco abuse      PLAN:  [name] [name] is a 61 year old male who has a long-standing history of tobacco use, as well as a several year history of hypertension and hypothyroidism.  Past.  He also is felt to have stage III chronic kidney disease.  Remotely he had throat cancer and underwent chemotherapy and radiation and still smokes cigarettes.  He has been bothered with kidney stones leading to recent development of hydronephrosis requiring percutaneous nephrostomy.  He is in need for follow-up urologic surgery.  He denies any episodes of chest pain.  His ECG shows T-wave inversion anterolaterally.  He does not exercise regularly.  He's not aware of any prolonged episode of chest pressure.  I am recommending he undergo an exercise Myoview study to evaluate potential ischemia in the etiology of his diffuse anterolateral T-wave abnormality.  I am also scheduling him for a  cardiac echo to further evaluate his systolic murmur as well as systolic and diastolic function.  In this patient with history of hypertension on a 3 drug regimen.  I discussed the importance of complete  smoking cessation.  I am also recommending fasting laboratory.  In the past he had an atherogenic dyslipidemic lipid profile ith elevation of triglycerides, VLDL, and a low HDL suggesting small LDL particles. I will defer preoperative clearance until he completes the above studies and will see him in the office for follow-up evaluation.    Medication Adjustments/Labs and Tests Ordered: Current medicines are reviewed at length with the patient today.  Concerns regarding medicines are outlined above.  Medication changes, Labs and Tests ordered today are listed in the Patient Instructions below. Patient Instructions  Medication Instructions:  Continue current medications  If you need a refill on your cardiac medications before your next appointment, please call your pharmacy.  Labwork: CBC, CMP, TSH, fasting Lipids   Testing/Procedures: Your physician has requested that you have an echocardiogram. Echocardiography is a painless test that uses sound waves to create images of your heart. It provides your doctor with information about the size and shape of your heart and how well your heart's chambers and valves are working. This procedure takes approximately one hour. There are no restrictions for this procedure.  Your physician has requested that you have en exercise stress myoview. For further information please visit [email or web address]. Please follow instruction sheet, as given.   Follow-Up: Your physician wants you to follow-up in: After Test.    Thank you for choosing [facility] at [facility]!!         Signed, Shelva Majestic, MD  12/28/2016 4:51 PM    [facility] [address] [place], [place], [place]  [address] Phone: [phone]

## 2016-12-29 ENCOUNTER — Ambulatory Visit (HOSPITAL_COMMUNITY): Payer: Self-pay | Attending: Cardiovascular Disease

## 2016-12-29 ENCOUNTER — Telehealth (HOSPITAL_COMMUNITY): Payer: Self-pay

## 2016-12-29 ENCOUNTER — Other Ambulatory Visit: Payer: Self-pay

## 2016-12-29 DIAGNOSIS — R9431 Abnormal electrocardiogram [ECG] [EKG]: Secondary | ICD-10-CM

## 2016-12-29 DIAGNOSIS — I340 Nonrheumatic mitral (valve) insufficiency: Secondary | ICD-10-CM | POA: Insufficient documentation

## 2016-12-29 DIAGNOSIS — F17210 Nicotine dependence, cigarettes, uncomplicated: Secondary | ICD-10-CM | POA: Insufficient documentation

## 2016-12-29 DIAGNOSIS — N189 Chronic kidney disease, unspecified: Secondary | ICD-10-CM | POA: Insufficient documentation

## 2016-12-29 DIAGNOSIS — Z01818 Encounter for other preprocedural examination: Secondary | ICD-10-CM

## 2016-12-29 DIAGNOSIS — I517 Cardiomegaly: Secondary | ICD-10-CM | POA: Insufficient documentation

## 2016-12-29 NOTE — Telephone Encounter (Signed)
Encounter complete. 

## 2016-12-31 ENCOUNTER — Ambulatory Visit (HOSPITAL_COMMUNITY)
Admission: RE | Admit: 2016-12-31 | Discharge: 2016-12-31 | Disposition: A | Discharge status: Home / Self Care | Payer: Self-pay | Source: Ambulatory Visit | Attending: Cardiology | Admitting: Cardiology

## 2016-12-31 DIAGNOSIS — R9431 Abnormal electrocardiogram [ECG] [EKG]: Secondary | ICD-10-CM

## 2016-12-31 DIAGNOSIS — Z01818 Encounter for other preprocedural examination: Secondary | ICD-10-CM

## 2017-01-03 ENCOUNTER — Other Ambulatory Visit: Payer: Self-pay | Admitting: Family Medicine

## 2017-01-03 ENCOUNTER — Ambulatory Visit (INDEPENDENT_AMBULATORY_CARE_PROVIDER_SITE_OTHER): Payer: Self-pay | Admitting: Family Medicine

## 2017-01-03 ENCOUNTER — Encounter: Payer: Self-pay | Admitting: Family Medicine

## 2017-01-03 VITALS — BP 133/90 | HR 73 | Temp 98.4°F | Resp 14 | Ht 69.0 in | Wt 174.0 lb

## 2017-01-03 DIAGNOSIS — Z436 Encounter for attention to other artificial openings of urinary tract: Secondary | ICD-10-CM

## 2017-01-03 DIAGNOSIS — Z1329 Encounter for screening for other suspected endocrine disorder: Secondary | ICD-10-CM

## 2017-01-03 DIAGNOSIS — N12 Tubulo-interstitial nephritis, not specified as acute or chronic: Secondary | ICD-10-CM

## 2017-01-03 DIAGNOSIS — R9431 Abnormal electrocardiogram [ECG] [EKG]: Secondary | ICD-10-CM

## 2017-01-03 LAB — CBC WITH DIFFERENTIAL/PLATELET
BASOS PCT: 0.6 %
Basophils Absolute: 55 cells/uL (ref 0–200)
EOS PCT: 1.5 %
Eosinophils Absolute: 137 cells/uL (ref 15–500)
HEMATOCRIT: 38.8 % (ref 38.5–50.0)
Hemoglobin: 12.9 g/dL — ABNORMAL LOW (ref 13.2–17.1)
LYMPHS ABS: 764 {cells}/uL — AB (ref 850–3900)
MCH: 27 pg (ref 27.0–33.0)
MCHC: 33.2 g/dL (ref 32.0–36.0)
MCV: 81.2 fL (ref 80.0–100.0)
MPV: 11.1 fL (ref 7.5–12.5)
Monocytes Relative: 5.6 %
Neutro Abs: 7635 cells/uL (ref 1500–7800)
Neutrophils Relative %: 83.9 %
PLATELETS: 245 10*3/uL (ref 140–400)
RBC: 4.78 10*6/uL (ref 4.20–5.80)
RDW: 14.4 % (ref 11.0–15.0)
TOTAL LYMPHOCYTE: 8.4 %
WBC: 9.1 10*3/uL (ref 3.8–10.8)
WBCMIX: 510 {cells}/uL (ref 200–950)

## 2017-01-03 LAB — POCT URINALYSIS DIP (DEVICE)
BILIRUBIN URINE: NEGATIVE
Glucose, UA: NEGATIVE mg/dL
KETONES UR: NEGATIVE mg/dL
Nitrite: POSITIVE — AB
PH: 5.5 (ref 5.0–8.0)
Protein, ur: 100 mg/dL — AB
Specific Gravity, Urine: 1.02 (ref 1.005–1.030)
Urobilinogen, UA: 0.2 mg/dL (ref 0.0–1.0)

## 2017-01-03 MED ORDER — CIPROFLOXACIN HCL 500 MG PO TABS
500.0000 mg | ORAL_TABLET | Freq: Two times a day (BID) | ORAL | 0 refills | Status: DC
Start: 2017-01-03 — End: 2017-01-31

## 2017-01-03 MED ORDER — LEVOTHYROXINE SODIUM 100 MCG PO TABS: 100.0000 ug | ORAL_TABLET | Freq: Every day | ORAL | 6 refills | Status: DC

## 2017-01-03 MED ORDER — AMLODIPINE BESYLATE 10 MG PO TABS: 10.0000 mg | ORAL_TABLET | Freq: Every day | ORAL | 6 refills | Status: DC

## 2017-01-03 MED ORDER — TRAMADOL HCL 50 MG PO TABS: 50.0000 mg | ORAL_TABLET | Freq: Three times a day (TID) | ORAL | 0 refills | Status: DC | PRN

## 2017-01-03 MED ORDER — CEFTRIAXONE SODIUM 250 MG IJ SOLR
500.0000 mg | Freq: Once | INTRAMUSCULAR | Status: AC
  Administered 2017-01-03: 500 mg via INTRAMUSCULAR

## 2017-01-03 MED ORDER — HYDRALAZINE HCL 25 MG PO TABS: 75.0000 mg | ORAL_TABLET | Freq: Three times a day (TID) | ORAL | 4 refills | Status: DC

## 2017-01-03 MED ORDER — METOPROLOL TARTRATE 25 MG PO TABS: 12.5000 mg | ORAL_TABLET | Freq: Two times a day (BID) | ORAL | 3 refills | Status: DC

## 2017-01-03 MED FILL — ?METOPROLOL 25 MG TABLET: 25 | 30 days supply | Qty: 30 | Fill #0

## 2017-01-03 MED FILL — AMLODIPINE BESYLATE 10 MG T: 10 | 30 days supply | Qty: 30 | Fill #0

## 2017-01-03 MED FILL — CIPROFLOXACIN HCL 500 MG TA: 500 | 7 days supply | Qty: 14 | Fill #0

## 2017-01-03 MED FILL — hydrALAZINE HCL 25 MG TABS: 25 | 30 days supply | Qty: 270 | Fill #0

## 2017-01-03 MED FILL — ?LEVOTHYROXINE 100 MCG TAB: 100 | 30 days supply | Qty: 30 | Fill #0

## 2017-01-03 NOTE — Patient Instructions (Signed)
Return for follow-up in 5 days. If you experience fever or chills, prior to this time, return for care sooner.    Pyelonephritis, Adult Pyelonephritis is a kidney infection. The kidneys are organs that help clean your blood by moving waste out of your blood and into your pee (urine). This infection can happen quickly, or it can last for a long time. In most cases, it clears up with treatment and does not cause other problems. Follow these instructions at home: Medicines  Take over-the-counter and prescription medicines only as told by your doctor.  Take your antibiotic medicine as told by your doctor. Do not stop taking the medicine even if you start to feel better. General instructions  Drink enough fluid to keep your pee clear or pale yellow.  Avoid caffeine, tea, and carbonated drinks.  Pee (urinate) often. Avoid holding in pee for long periods of time.  Pee before and after sex.  After pooping (having a bowel movement), women should wipe from front to back. Use each tissue only once.  Keep all follow-up visits as told by your doctor. This is important. Contact a doctor if:  You do not feel better after 2 days.  Your symptoms get worse.  You have a fever. Get help right away if:  You cannot take your medicine or drink fluids as told.  You have chills and shaking.  You throw up (vomit).  You have very bad pain in your side (flank) or back.  You feel very weak or you pass out (faint). This information is not intended to replace advice given to you by your health care provider. Make sure you discuss any questions you have with your health care provider. Document Released: 03/25/2004 Document Revised: 07/24/2015 Document Reviewed: 06/10/2014 Elsevier Interactive Patient Education  [name].

## 2017-01-03 NOTE — Progress Notes (Signed)
Patient ID: [name] [name], male    DOB: [birth year], 61 y.o.   MRN: [id number]  PCP: Scot Jun, FNP  Chief Complaint  Patient presents with  . Follow-up    6 WEEKS    Subjective:  HPI [name] [name] is a 61 y.o. male presents for six week evaluation and he complains of dysuria and left flank pain s/p  urostomy tube placement. Urostomy tube was placed due to a 2.2 cm renal stone noted in the left kidney along with left hydroureter nephrosis.  Procedure was completed on 11/15/2016.  Equan was seen by urology for preadmission to have urostomy tube removed 12/20/2016.  He underwent a presurgical clearance and was found to have an abnormal EKG with T wave abnormality.  He was referred to Dr. Shelva Majestic in cardiology for preoperative clearance.  Dr. Claiborne Billings has ordered a nuclear stress test to be performed on 01/07/2017 to rule out any underlying cardiovascular abnormality.  Today, Caspar reports increased left flank pain and dysuria.  He denies fever, chills, abdominal pain, or fatigue.  He reports the pain has increased every time he urinates and radiates to his left flank area to the site of the urostomy tube.  He has not followed up with urology since he was referred to cardiology for presurgical clearance.  Social History   Socioeconomic History  . Marital status: Single    Spouse name: Not on file  . Number of children: Not on file  . Years of education: Not on file  . Highest education level: Not on file  Social Needs  . Financial resource strain: Not on file  . Food insecurity - worry: Not on file  . Food insecurity - inability: Not on file  . Transportation needs - medical: Not on file  . Transportation needs - non-medical: Not on file  Occupational History  . Not on file  Tobacco Use  . Smoking status: Current Every Day Smoker    Packs/day: 0.25    Years: 25.00    Pack years: 6.25    Types: Cigarettes  . Smokeless tobacco: Never Used  . Tobacco comment: Smoking 4-5  cigarettes daily on average  Substance and Sexual Activity  . Alcohol use: No    Alcohol/week: 0.0 oz  . Drug use: No  . Sexual activity: Not on file  Other Topics Concern  . Not on file  Social History Narrative  . Not on file    Family History  Problem Relation Age of Onset  . Cancer Father 30       colon  . Cancer Sister 1       leukemia   Review of Systems See HPI  Patient Active Problem List   Diagnosis Date Noted  . Nephrolithiasis 11/14/2016  . Hypothyroidism 06/23/2015  . Noncompliance with follow up and medications 06/23/2015  . Acute kidney injury superimposed on chronic kidney disease ([place]) 06/22/2015  . Left nephrolithiasis 06/22/2015  . Hydronephrosis, left 06/22/2015  . Acute diverticulitis 06/22/2015  . 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 Renal insufficiency     Allergies  Allergen Reactions  . Codeine Nausea Only    Prior to Admission medications   Medication Sig Start Date End Date Taking? Authorizing Provider  amLODipine (NORVASC) 10 MG tablet Take 1 tablet (10 mg total)  by mouth daily. 12/14/16  Yes Scot Jun, FNP  levothyroxine (SYNTHROID, LEVOTHROID) 100 MCG tablet Take 1 tablet (100 mcg total) by mouth daily before breakfast. 12/14/16 01/13/17 Yes Scot Jun, FNP  metoprolol tartrate (LOPRESSOR) 25 MG tablet Take 0.5 tablets (12.5 mg total) by mouth 2 (two) times daily. 11/22/16  Yes Scot Jun, FNP  hydrALAZINE (APRESOLINE) 25 MG tablet Take 3 tablets (75 mg total) by mouth every 8 (eight) hours. Patient not taking: Reported on 01/03/2017 11/22/16 04/21/17  Scot Jun, FNP    Past Medical, Surgical Family and Social History reviewed and updated.    Objective:   Today's Vitals   01/03/17 1019  BP: 133/90  Pulse: 73  Resp: 14   Temp: 98.4 F (36.9 C)  TempSrc: Oral  SpO2: 98%  Weight: 174 lb (78.9 kg)  Height: 5\' 9"  (1.753 m)    Wt Readings from Last 3 Encounters:  01/03/17 174 lb (78.9 kg)  12/21/16 172 lb (78 kg)  12/17/16 174 lb 12.8 oz (79.3 kg)    Physical Exam  Constitutional: He is oriented to person, place, and time. He appears well-developed and well-nourished.  HENT:  Head: Normocephalic and atraumatic.  Eyes: Conjunctivae are normal. Pupils are equal, round, and reactive to light.  Neck: Normal range of motion. Neck supple.  Cardiovascular: Normal rate, regular rhythm, normal heart sounds and intact distal pulses.  Pulmonary/Chest: Effort normal and breath sounds normal.  Abdominal: There is tenderness in the suprapubic area and left lower quadrant.  Neurological: He is alert and oriented to person, place, and time.  Skin: Skin is warm and dry.  urostomy site erythematous   Psychiatric: He has a normal mood and affect. His behavior is normal. Judgment and thought content normal.   Assessment & Plan:  1. Pyelonephritis, suspect pyelonephritis. UA significant for nitrates and leukocytes. Will check a CBC today to rule out leukocytosis. Administer Rocephin 250 mg IM. Start oral antibiotic treatment with Ciprofloxacin 500 mg twice daily x 10 days.   2. Attention to urostomy [facility]) erythematous at the tube site. Will check CBC with Differential to rule out underlying infection.  3. Abnormal EKG, Zalan is fasting today, will check lipid panel.  4. Screening for thyroid disorder- Thyroid Panel With TSH  Orders Placed This Encounter  Procedures  . Urine Culture  . CBC with Differential  . COMPLETE METABOLIC PANEL WITH GFR  . Thyroid Panel With TSH  . Lipid panel  . POCT urinalysis dip (device)    Meds ordered this encounter  Medications  . metoprolol tartrate (LOPRESSOR) 25 MG tablet    Sig: Take 0.5 tablets (12.5 mg total) 2 (two) times daily by mouth.    Dispense:  60 tablet     Refill:  3    Patient will pick up when needed    Order Specific Question:   Supervising Provider    Answer:   Tresa Garter [[id number]]  . levothyroxine (SYNTHROID, LEVOTHROID) 100 MCG tablet    Sig: Take 1 tablet (100 mcg total) daily before breakfast by mouth.    Dispense:  30 tablet    Refill:  6    Patient will pick up when needed    Order Specific Question:   Supervising Provider    Answer:   Tresa Garter [[id number]]  . hydrALAZINE (APRESOLINE) 25 MG tablet    Sig: Take 3 tablets (75 mg total) every 8 (eight) hours by mouth.    Dispense:  270  tablet    Refill:  4    Patient will pick up when needed    Order Specific Question:   Supervising Provider    Answer:   Tresa Garter [id number]  . amLODipine (NORVASC) 10 MG tablet    Sig: Take 1 tablet (10 mg total) daily by mouth.    Dispense:  30 tablet    Refill:  6    Patient will pick up when needed    Order Specific Question:   Supervising Provider    Answer:   Tresa Garter [[id number]]  . cefTRIAXone (ROCEPHIN) injection 500 mg  . ciprofloxacin (CIPRO) 500 MG tablet    Sig: Take 1 tablet (500 mg total) 2 (two) times daily by mouth.    Dispense:  14 tablet    Refill:  0    Order Specific Question:   Supervising Provider    Answer:   Tresa Garter [id number]  . traMADol (ULTRAM) 50 MG tablet    Sig: Take 1 tablet (50 mg total) every 8 (eight) hours as needed by mouth.    Dispense:  20 tablet    Refill:  0    Order Specific Question:   Supervising Provider    Answer:   Tresa Garter [[id number]]    RTC: 01/07/2017, follow-up on pyelonephritis symptoms    Carroll Sage. [name], MSN, FNP-C [facility]  [address] [place], [place] [address] [phone]

## 2017-01-04 ENCOUNTER — Telehealth (HOSPITAL_COMMUNITY): Payer: Self-pay

## 2017-01-04 LAB — THYROID PANEL WITH TSH
FREE THYROXINE INDEX: 3.1 (ref 1.4–3.8)
T3 UPTAKE: 33 % (ref 22–35)
T4, Total: 9.5 ug/dL (ref 4.9–10.5)
TSH: 3.5 m[IU]/L (ref 0.40–4.50)

## 2017-01-04 LAB — COMPLETE METABOLIC PANEL WITH GFR
AG Ratio: 1.4 (calc) (ref 1.0–2.5)
ALKALINE PHOSPHATASE (APISO): 77 U/L (ref 40–115)
ALT: 11 U/L (ref 9–46)
AST: 10 U/L (ref 10–35)
Albumin: 4.1 g/dL (ref 3.6–5.1)
BILIRUBIN TOTAL: 0.4 mg/dL (ref 0.2–1.2)
BUN/Creatinine Ratio: 11 (calc) (ref 6–22)
BUN: 16 mg/dL (ref 7–25)
CHLORIDE: 109 mmol/L (ref 98–110)
CO2: 23 mmol/L (ref 20–32)
Calcium: 9.2 mg/dL (ref 8.6–10.3)
Creat: 1.51 mg/dL — ABNORMAL HIGH (ref 0.70–1.25)
GFR, Est African American: 57 mL/min/{1.73_m2} — ABNORMAL LOW (ref >=60)
GFR, Est Non African American: 49 mL/min/{1.73_m2} — ABNORMAL LOW (ref >=60)
GLUCOSE: 102 mg/dL — AB (ref 65–99)
Globulin: 2.9 g/dL (calc) (ref 1.9–3.7)
Potassium: 4.6 mmol/L (ref 3.5–5.3)
Sodium: 141 mmol/L (ref 135–146)
Total Protein: 7 g/dL (ref 6.1–8.1)

## 2017-01-04 LAB — LIPID PANEL
Cholesterol: 173 mg/dL (ref <200)
HDL: 28 mg/dL — ABNORMAL LOW (ref >40)
LDL Cholesterol (Calc): 120 mg/dL (calc) — ABNORMAL HIGH
Non-HDL Cholesterol (Calc): 145 mg/dL (calc) — ABNORMAL HIGH (ref <130)
Total CHOL/HDL Ratio: 6.2 (calc) — ABNORMAL HIGH (ref <5.0)
Triglycerides: 139 mg/dL (ref <150)

## 2017-01-04 LAB — CBC WITH DIFFERENTIAL/PLATELET

## 2017-01-04 NOTE — Telephone Encounter (Signed)
Encounter complete. 

## 2017-01-05 LAB — URINE CULTURE
MICRO NUMBER: 81239682
SPECIMEN QUALITY:: ADEQUATE

## 2017-01-07 ENCOUNTER — Ambulatory Visit (HOSPITAL_COMMUNITY)
Admission: RE | Admit: 2017-01-07 | Discharge: 2017-01-07 | Disposition: A | Discharge status: Home / Self Care | Payer: Self-pay | Source: Ambulatory Visit | Attending: Cardiovascular Disease | Admitting: Cardiovascular Disease

## 2017-01-07 ENCOUNTER — Ambulatory Visit (INDEPENDENT_AMBULATORY_CARE_PROVIDER_SITE_OTHER): Payer: Self-pay | Admitting: Family Medicine

## 2017-01-07 ENCOUNTER — Encounter: Payer: Self-pay | Admitting: Family Medicine

## 2017-01-07 VITALS — BP 130/68 | HR 75 | Temp 97.7°F | Resp 16 | Ht 69.0 in | Wt 176.0 lb

## 2017-01-07 DIAGNOSIS — R9431 Abnormal electrocardiogram [ECG] [EKG]: Secondary | ICD-10-CM | POA: Insufficient documentation

## 2017-01-07 DIAGNOSIS — Z01818 Encounter for other preprocedural examination: Secondary | ICD-10-CM | POA: Insufficient documentation

## 2017-01-07 DIAGNOSIS — Z436 Encounter for attention to other artificial openings of urinary tract: Secondary | ICD-10-CM

## 2017-01-07 DIAGNOSIS — N12 Tubulo-interstitial nephritis, not specified as acute or chronic: Secondary | ICD-10-CM

## 2017-01-07 DIAGNOSIS — R9439 Abnormal result of other cardiovascular function study: Secondary | ICD-10-CM | POA: Insufficient documentation

## 2017-01-07 LAB — POCT URINALYSIS DIP (DEVICE)
BILIRUBIN URINE: NEGATIVE
Glucose, UA: NEGATIVE mg/dL
Ketones, ur: NEGATIVE mg/dL
NITRITE: NEGATIVE
Protein, ur: 30 mg/dL — AB
SPECIFIC GRAVITY, URINE: 1.025 (ref 1.005–1.030)
Urobilinogen, UA: 0.2 mg/dL (ref 0.0–1.0)
pH: 5.5 (ref 5.0–8.0)

## 2017-01-07 LAB — MYOCARDIAL PERFUSION IMAGING
CHL CUP NUCLEAR SDS: 5
CHL CUP NUCLEAR SSS: 11
CHL CUP RESTING HR STRESS: 55 {beats}/min
LVDIAVOL: 119 mL (ref 62–150)
LVSYSVOL: 60 mL
NUC STRESS TID: 1.04
Peak HR: 86 {beats}/min
SRS: 6

## 2017-01-07 MED ORDER — REGADENOSON 0.4 MG/5ML IV SOLN
0.4000 mg | Freq: Once | INTRAVENOUS | Status: AC
  Administered 2017-01-07: 0.4 mg via INTRAVENOUS

## 2017-01-07 MED ORDER — TECHNETIUM TC 99M TETROFOSMIN IV KIT
26.0000 | PACK | Freq: Once | INTRAVENOUS | Status: AC | PRN
  Administered 2017-01-07: 26 via INTRAVENOUS
  Filled 2017-01-07: qty 26

## 2017-01-07 MED ORDER — TECHNETIUM TC 99M TETROFOSMIN IV KIT
8.1000 | PACK | Freq: Once | INTRAVENOUS | Status: AC | PRN
  Administered 2017-01-07: 8.1 via INTRAVENOUS
  Filled 2017-01-07: qty 9

## 2017-01-07 NOTE — Progress Notes (Signed)
Patient ID: [name] [name], male    DOB: [birth year], 61 y.o.   MRN: [id number]  PCP: Scot Jun, FNP  Chief Complaint  Patient presents with  . Follow-up    Subjective:  HPI [name] [name] is a 61 y.o. male presents to follow-up of pyelonephritis. [name] [name] was seen in office on 01/03/2017 with a complaint of left flank pain, most pronounced at the urostomy site, and dysuria. UA confirmed nitrates and leukocytes. He was given a dose of Rocephin in the office and started empirically on oral antibiotics. Today, [name] [name] reports improvement of symptoms.  He denies any flank pain or dysuria.  He was seen today by cardiologist for a stress test.  During an initial presurgical cardiovascular workup by urology he was found to have an abnormal EKG. Therefore urology had to postpone procedure to remove urostomy until Iann has been medically cleared by cardiology.  He has a follow-up scheduled next week with cardiology and depending on the outcome of that visit we will determine when he will follow-up with urology.  He denies fever, chills, nausea, vomiting, abdominal pain, or hematuria. Social History   Socioeconomic History  . Marital status: Single    Spouse name: Not on file  . Number of children: Not on file  . Years of education: Not on file  . Highest education level: Not on file  Social Needs  . Financial resource strain: Not on file  . Food insecurity - worry: Not on file  . Food insecurity - inability: Not on file  . Transportation needs - medical: Not on file  . Transportation needs - non-medical: Not on file  Occupational History  . Not on file  Tobacco Use  . Smoking status: Current Every Day Smoker    Packs/day: 0.25    Years: 25.00    Pack years: 6.25    Types: Cigarettes  . Smokeless tobacco: Never Used  . Tobacco comment: Smoking 4-5 cigarettes daily on average  Substance and Sexual Activity  . Alcohol use: No    Alcohol/week: 0.0 oz  . Drug use: No  . Sexual  activity: Not on file  Other Topics Concern  . Not on file  Social History Narrative  . Not on file    Family History  Problem Relation Age of Onset  . Cancer Father 66       colon  . Cancer Sister 61       leukemia   Review of Systems See HPI  Patient Active Problem List   Diagnosis Date Noted  . Nephrolithiasis 11/14/2016  . Hypothyroidism 06/23/2015  . Noncompliance with follow up and medications 06/23/2015  . Acute kidney injury superimposed on chronic kidney disease ([place]) 06/22/2015  . Left nephrolithiasis 06/22/2015  . Hydronephrosis, left 06/22/2015  . Acute diverticulitis 06/22/2015  . 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 Renal insufficiency     Allergies  Allergen Reactions  . Codeine Nausea Only    Prior to Admission medications   Medication Sig Start Date End Date Taking? Authorizing Provider  amLODipine (NORVASC) 10 MG tablet Take 1 tablet (10 mg total) daily by mouth. 01/03/17  Yes Scot Jun, FNP  ciprofloxacin (CIPRO) 500 MG tablet Take 1 tablet (500 mg total) 2 (two) times daily by mouth. 01/03/17  Yes Scot Jun,  FNP  hydrALAZINE (APRESOLINE) 25 MG tablet Take 3 tablets (75 mg total) every 8 (eight) hours by mouth. 01/03/17 06/02/17 Yes Scot Jun, FNP  levothyroxine (SYNTHROID, LEVOTHROID) 100 MCG tablet Take 1 tablet (100 mcg total) daily before breakfast by mouth. 01/03/17 02/02/17 Yes Scot Jun, FNP  metoprolol tartrate (LOPRESSOR) 25 MG tablet Take 0.5 tablets (12.5 mg total) 2 (two) times daily by mouth. 01/03/17  Yes Scot Jun, FNP  traMADol (ULTRAM) 50 MG tablet Take 1 tablet (50 mg total) every 8 (eight) hours as needed by mouth. 01/03/17  Yes Scot Jun, FNP    Past Medical, Surgical Family and Social History reviewed and  updated.    Objective:   Today's Vitals   01/07/17 1424  BP: 130/68  Pulse: 75  Resp: 16  Temp: 97.7 F (36.5 C)  TempSrc: Oral  SpO2: 98%  Weight: 176 lb (79.8 kg)  Height: 5\' 9"  (1.753 m)    Wt Readings from Last 3 Encounters:  01/07/17 176 lb (79.8 kg)  01/07/17 172 lb (78 kg)  01/03/17 174 lb (78.9 kg)    Physical Exam  Constitutional: He is oriented to person, place, and time. He appears well-developed and well-nourished.  HENT:  Head: Normocephalic and atraumatic.  Eyes: Conjunctivae and EOM are normal. Pupils are equal, round, and reactive to light.  Neck: Normal range of motion. Neck supple.  Cardiovascular: Normal rate, regular rhythm, normal heart sounds and intact distal pulses.  Pulmonary/Chest: Effort normal and breath sounds normal.  Abdominal: Soft. Bowel sounds are normal. There is no tenderness. There is no CVA tenderness.  Genitourinary:  Genitourinary Comments: Urostomy site clean, dry, and intact   Neurological: He is alert and oriented to person, place, and time.  Skin: Skin is warm and dry.  Psychiatric: He has a normal mood and affect. His behavior is normal. Judgment and thought content normal.   Assessment & Plan:  1. Pyelonephritis, improving, symptomatic. Complete all antibiotics. Cautioned to return if fever, chills, dysuria, or flank pain returns prior to follow-up with urology.   2. Attention to urostomy [facility]) -Clean, dry,intact site. No evidence of infection. Instructed to continue daily dressing changes.  RTC:  3 months for chronic disease management  Carroll Sage. [name], MSN, FNP-C [facility] [place]  [address] [place], [place] [address] [phone]

## 2017-01-10 ENCOUNTER — Other Ambulatory Visit: Payer: Self-pay

## 2017-01-10 ENCOUNTER — Emergency Department (HOSPITAL_COMMUNITY): Payer: Self-pay

## 2017-01-10 ENCOUNTER — Encounter (HOSPITAL_COMMUNITY): Payer: Self-pay | Admitting: Nurse Practitioner

## 2017-01-10 ENCOUNTER — Emergency Department (HOSPITAL_COMMUNITY)
Admission: EM | Admit: 2017-01-10 | Discharge: 2017-01-10 | Disposition: A | Discharge status: Home / Self Care | Payer: Self-pay | Attending: Emergency Medicine | Admitting: Emergency Medicine

## 2017-01-10 DIAGNOSIS — T83098A Other mechanical complication of other indwelling urethral catheter, initial encounter: Secondary | ICD-10-CM | POA: Insufficient documentation

## 2017-01-10 DIAGNOSIS — E039 Hypothyroidism, unspecified: Secondary | ICD-10-CM | POA: Insufficient documentation

## 2017-01-10 DIAGNOSIS — F17210 Nicotine dependence, cigarettes, uncomplicated: Secondary | ICD-10-CM | POA: Insufficient documentation

## 2017-01-10 DIAGNOSIS — R109 Unspecified abdominal pain: Secondary | ICD-10-CM | POA: Insufficient documentation

## 2017-01-10 DIAGNOSIS — Y829 Unspecified medical devices associated with adverse incidents: Secondary | ICD-10-CM | POA: Insufficient documentation

## 2017-01-10 DIAGNOSIS — Z85858 Personal history of malignant neoplasm of other endocrine glands: Secondary | ICD-10-CM | POA: Insufficient documentation

## 2017-01-10 DIAGNOSIS — Z85819 Personal history of malignant neoplasm of unspecified site of lip, oral cavity, and pharynx: Secondary | ICD-10-CM | POA: Insufficient documentation

## 2017-01-10 DIAGNOSIS — Z79899 Other long term (current) drug therapy: Secondary | ICD-10-CM | POA: Insufficient documentation

## 2017-01-10 DIAGNOSIS — I10 Essential (primary) hypertension: Secondary | ICD-10-CM | POA: Insufficient documentation

## 2017-01-10 HISTORY — PX: IR NEPHROSTOMY EXCHANGE LEFT: IMG6069

## 2017-01-10 LAB — BASIC METABOLIC PANEL
ANION GAP: 8 (ref 5–15)
BUN: 13 mg/dL (ref 6–20)
CALCIUM: 8.5 mg/dL — AB (ref 8.9–10.3)
CO2: 20 mmol/L — ABNORMAL LOW (ref 22–32)
Chloride: 110 mmol/L (ref 101–111)
Creatinine, Ser: 1.57 mg/dL — ABNORMAL HIGH (ref 0.61–1.24)
GFR, EST AFRICAN AMERICAN: 53 mL/min — AB (ref >60)
GFR, EST NON AFRICAN AMERICAN: 46 mL/min — AB (ref >60)
Glucose, Bld: 112 mg/dL — ABNORMAL HIGH (ref 65–99)
Potassium: 3.7 mmol/L (ref 3.5–5.1)
Sodium: 138 mmol/L (ref 135–145)

## 2017-01-10 LAB — URINALYSIS, ROUTINE W REFLEX MICROSCOPIC
BILIRUBIN URINE: NEGATIVE
GLUCOSE, UA: NEGATIVE mg/dL
Ketones, ur: NEGATIVE mg/dL
NITRITE: NEGATIVE
PH: 6 (ref 5.0–8.0)
Protein, ur: NEGATIVE mg/dL
SPECIFIC GRAVITY, URINE: 1.01 (ref 1.005–1.030)
Squamous Epithelial / LPF: NONE SEEN

## 2017-01-10 LAB — PROTIME-INR
INR: 1.02
Prothrombin Time: 13.3 seconds (ref 11.4–15.2)

## 2017-01-10 LAB — CBC WITH DIFFERENTIAL/PLATELET
BASOS ABS: 0 10*3/uL (ref 0.0–0.1)
BASOS PCT: 0 %
EOS PCT: 2 %
Eosinophils Absolute: 0.2 10*3/uL (ref 0.0–0.7)
HEMATOCRIT: 38.4 % — AB (ref 39.0–52.0)
Hemoglobin: 12.3 g/dL — ABNORMAL LOW (ref 13.0–17.0)
LYMPHS ABS: 0.8 10*3/uL (ref 0.7–4.0)
LYMPHS PCT: 7 %
MCH: 26.8 pg (ref 26.0–34.0)
MCHC: 32 g/dL (ref 30.0–36.0)
MCV: 83.7 fL (ref 78.0–100.0)
MONO ABS: 0.8 10*3/uL (ref 0.1–1.0)
Monocytes Relative: 8 %
NEUTROS ABS: 8.4 10*3/uL — AB (ref 1.7–7.7)
Neutrophils Relative %: 83 %
PLATELETS: 219 10*3/uL (ref 150–400)
RBC: 4.59 MIL/uL (ref 4.22–5.81)
RDW: 15.8 % — AB (ref 11.5–15.5)
WBC: 10.2 10*3/uL (ref 4.0–10.5)

## 2017-01-10 MED ORDER — IOPAMIDOL (ISOVUE-300) INJECTION 61%
INTRAVENOUS | Status: AC
  Administered 2017-01-10: 10 mL
  Filled 2017-01-10: qty 50

## 2017-01-10 MED ORDER — LIDOCAINE HCL 1 % IJ SOLN
INTRAMUSCULAR | Status: AC
  Administered 2017-01-10: 12:00:00
  Filled 2017-01-10: qty 20

## 2017-01-10 MED ORDER — ONDANSETRON HCL 4 MG/2ML IJ SOLN
4.0000 mg | Freq: Once | INTRAMUSCULAR | Status: AC
  Administered 2017-01-10: 4 mg via INTRAVENOUS
  Filled 2017-01-10: qty 2

## 2017-01-10 MED ORDER — IOPAMIDOL (ISOVUE-300) INJECTION 61%
50.0000 mL | Freq: Once | INTRAVENOUS | Status: AC | PRN
  Administered 2017-01-10: 10 mL

## 2017-01-10 MED ORDER — MORPHINE SULFATE (PF) 4 MG/ML IV SOLN
4.0000 mg | INTRAVENOUS | Status: DC | PRN
  Administered 2017-01-10: 4 mg via INTRAVENOUS
  Filled 2017-01-10: qty 1

## 2017-01-10 NOTE — Procedures (Signed)
Left nephrostomy stopped draining and left flank pain.  Catheter injection confirmed partial dislodgement of the catheter.  The drain was successfully exchanged.  New 10 Fr drain placed in renal pelvis adjacent to large stone.  No hydronephrosis.  Minimal blood loss and no immediate complication.

## 2017-01-10 NOTE — Discharge Instructions (Signed)
Follow up with your cardiologist, and urologist as scheduled. Continue and complete her current course of antibiotics.

## 2017-01-10 NOTE — ED Provider Notes (Signed)
[place] DEPT Provider Note   CSN: [id number] Arrival date & time: 01/10/17  4098     History   Chief Complaint No chief complaint on file.   HPI [name] [name] is a 61 y.o. male. Chief complaint is left flank pain.  HPI this is a 19 male with a known large left renal pelvis kidney stone. Hip pain and hydronephrosis. A percutaneous nephrostomy tube was placed in September. He was scheduled to have returned for procedure for extraction/treatment of stable calculus. However, he was to undergo cardiac evaluation first. He has since had Lexiscan. This does show an area of peri-infarct ischemia and his anterior wall. Formal follow-up for this result has not happened yet. He has not had output from his left nephrostomy tube for the last 3 days and has had progressive pain in his left flank. Seen and evaluated on Thursday by his primary care. Urine sample showed nitrites and esterase. Given IM Rocephin, and placed on by mouth Cipro. Culture showed 3 species of greater than 10,000. Presents today with pain. Denies fever vomiting.  Past Medical History:  Diagnosis Date  . Arthritis   . Depression 06/12/11   Buproprion per Dr. Lamonte Sakai  . Full dentures    Fitting Per Dr. Enrique Sack  . Hypertension   . 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 Protein calorie malnutrition ([place]) 05/31/11  . Renal insufficiency 2012   Secondary to Hx. of Cisplatin and Dhydrtion  . Smoking 06/12/11   1/2 pack/day  . Status post chemotherapy 10/30/10 - 11/09/10    2 doses of Q 3 week Cisplatin  . Status post radiation therapy 10/11/10 - 12/24/10   Bilateral Neck and Mucosa axis /  70 gray in 35 fractions  . Xerostomia     Patient Active Problem List   Diagnosis Date Noted  . Nephrolithiasis 11/14/2016  . Hypothyroidism 06/23/2015  . Noncompliance with follow up and medications 06/23/2015  . Acute kidney injury superimposed on chronic kidney disease ([place]) 06/22/2015  . Left nephrolithiasis  06/22/2015  . Hydronephrosis, left 06/22/2015  . Acute diverticulitis 06/22/2015  . 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 Renal insufficiency     Past Surgical History:  Procedure Laterality Date  . APPENDECTOMY    . BIOPSY TONGUE     TonsilandBaseoftongue  . IR NEPHROSTOMY PLACEMENT LEFT  11/15/2016  . PEG TUBE REMOVAL  04/13/11       Home Medications    Prior to Admission medications   Medication Sig Start Date End Date Taking? Authorizing Provider  amLODipine (NORVASC) 10 MG tablet Take 1 tablet (10 mg total) daily by mouth. 01/03/17  Yes Scot Jun, FNP  ciprofloxacin (CIPRO) 500 MG tablet Take 1 tablet (500 mg total) 2 (two) times daily by mouth. Patient taking differently: Take 500 mg 2 (two) times daily by mouth. Started 11/05 for 7 days 01/03/17  Yes Scot Jun, FNP  hydrALAZINE (APRESOLINE) 25 MG tablet Take 3 tablets (75 mg total) every 8 (eight) hours by mouth. 01/03/17 06/02/17 Yes Scot Jun, FNP  levothyroxine (SYNTHROID, LEVOTHROID) 100 MCG tablet Take 1 tablet (100 mcg total) daily before breakfast by mouth. 01/03/17 02/02/17 Yes Scot Jun, FNP  metoprolol tartrate (LOPRESSOR) 25 MG tablet Take 0.5 tablets (12.5 mg total) 2 (two) times daily by mouth.  01/03/17  Yes Scot Jun, FNP  traMADol (ULTRAM) 50 MG tablet Take 1 tablet (50 mg total) every 8 (eight) hours as needed by mouth. Patient taking differently: Take 50 mg every 8 (eight) hours as needed by mouth for moderate pain.  01/03/17  Yes Scot Jun, FNP    Family History Family History  Problem Relation Age of Onset  . Cancer Father 56       colon  . Cancer Sister 53       leukemia    Social History Social History   Tobacco Use  . Smoking status: Current Every Day Smoker     Packs/day: 0.25    Years: 25.00    Pack years: 6.25    Types: Cigarettes  . Smokeless tobacco: Never Used  . Tobacco comment: Smoking 4-5 cigarettes daily on average  Substance Use Topics  . Alcohol use: No    Alcohol/week: 0.0 oz  . Drug use: No     Allergies   Codeine   Review of Systems Review of Systems  Constitutional: Negative for appetite change, chills, diaphoresis, fatigue and fever.  HENT: Negative for mouth sores, sore throat and trouble swallowing.   Eyes: Negative for visual disturbance.  Respiratory: Negative for cough, chest tightness, shortness of breath and wheezing.   Cardiovascular: Negative for chest pain.  Gastrointestinal: Negative for abdominal distention, abdominal pain, diarrhea, nausea and vomiting.  Endocrine: Negative for polydipsia, polyphagia and polyuria.  Genitourinary: Positive for flank pain. Negative for dysuria, frequency and hematuria.  Musculoskeletal: Negative for gait problem.  Skin: Negative for color change, pallor and rash.  Neurological: Negative for dizziness, syncope, light-headedness and headaches.  Hematological: Does not bruise/bleed easily.  Psychiatric/Behavioral: Negative for behavioral problems and confusion.     Physical Exam Updated Vital Signs BP 125/79 (BP Location: Right Arm)   Pulse (!) 56   Temp (!) 97.5 F (36.4 C) (Oral)   Resp 17   Ht 5\' 9"  (1.753 m)   Wt 79.8 kg (176 lb)   SpO2 98%   BMI 25.99 kg/m   Physical Exam  Constitutional: He is oriented to person, place, and time. He appears well-developed and well-nourished. No distress.  Awake alert. Conversant. He appears uncomfortable.  HENT:  Head: Normocephalic.  Eyes: Conjunctivae are normal. Pupils are equal, round, and reactive to light. No scleral icterus.  Neck: Normal range of motion. Neck supple. No thyromegaly present.  Cardiovascular: Normal rate and regular rhythm. Exam reveals no gallop and no friction rub.  No murmur  heard. Pulmonary/Chest: Effort normal and breath sounds normal. No respiratory distress. He has no wheezes. He has no rales.  Abdominal: Soft. Bowel sounds are normal. He exhibits no distension. There is no tenderness. There is no rebound.  Genitourinary:  Genitourinary Comments: Left lateral midabdominal nephrostomy tube site appears intact. Bag is dry. Tender to percuss over the left flank. No palpable distention of the urinary bladder or suprapubic pain.  Musculoskeletal: Normal range of motion.  Neurological: He is alert and oriented to person, place, and time.  Skin: Skin is warm and dry. No rash noted.  Psychiatric: He has a normal mood and affect. His behavior is normal.     ED Treatments / Results  Labs (all labs ordered are listed, but only abnormal results are displayed) Labs Reviewed  CBC WITH DIFFERENTIAL/PLATELET - Abnormal; Notable for the following components:      Result Value   Hemoglobin 12.3 (*)    HCT 38.4 (*)  RDW 15.8 (*)    Neutro Abs 8.4 (*)    All other components within normal limits  BASIC METABOLIC PANEL - Abnormal; Notable for the following components:   CO2 20 (*)    Glucose, Bld 112 (*)    Creatinine, Ser 1.57 (*)    Calcium 8.5 (*)    GFR calc non Af Amer 46 (*)    GFR calc Af Amer 53 (*)    All other components within normal limits  URINALYSIS, ROUTINE W REFLEX MICROSCOPIC - Abnormal; Notable for the following components:   Hgb urine dipstick LARGE (*)    Leukocytes, UA TRACE (*)    Bacteria, UA FEW (*)    All other components within normal limits  PROTIME-INR    EKG  EKG Interpretation  Date/Time:  Monday January 10 2017 08:50:01 EST Ventricular Rate:  66 PR Interval:    QRS Duration: 101 QT Interval:  408 QTC Calculation: 428 R Axis:   56 Text Interpretation:  Sinus rhythm Anterior infarct, old Confirmed by Tanna Furry [phone]) on 01/10/2017 8:55:55 AM       Radiology US Renal  Result Date: 01/10/2017 CLINICAL DATA:  Left  kidney stone. EXAM: RENAL / URINARY TRACT ULTRASOUND COMPLETE COMPARISON:  CT scan of November 14, 2016. FINDINGS: Right Kidney: Length: 11 cm. Echogenicity within normal limits. No mass or hydronephrosis visualized. Left Kidney: Length: 12.7 cm. 13 mm calculus seen in lower pole calyx. Echogenicity within normal limits. No mass or hydronephrosis visualized. Bladder: Appears normal for degree of bladder distention. Prostate gland is moderately enlarged. IMPRESSION: Nonobstructive left renal calculus. No hydronephrosis or renal obstruction is noted. Moderate prostatic enlargement. Electronically Signed   By: Marijo Conception, M.D.   On: 01/10/2017 09:55    Procedures Procedures (including critical care time)  Medications Ordered in ED Medications  morphine 4 MG/ML injection 4 mg (4 mg Intravenous Given 01/10/17 1003)  ondansetron (ZOFRAN) injection 4 mg (4 mg Intravenous Given 01/10/17 1003)  lidocaine (XYLOCAINE) 1 % (with pres) injection (  Given 01/10/17 1150)  iopamidol (ISOVUE-300) 61 % injection 50 mL (10 mLs Other Contrast Given 01/10/17 1109)     Initial Impression / Assessment and Plan / ED Course  I have reviewed the triage vital signs and the nursing notes.  Pertinent labs & imaging results that were available during my care of the patient were reviewed by me and considered in my medical decision making (see chart for details).    Plan IV pain meds. Keep nothing by mouth. Ultrasound to determine extent hydronephrosis. Evaluated urine function. Will discuss with urology and possibly interventional radiology upon completion of studies.  Final Clinical Impressions(s) / ED Diagnoses   Final diagnoses:  Flank pain  Obstruction of urostomy catheter, initial encounter [facility])   Patient underwent exchange of urostomy without difficulty. He is draining urine into his bag. He states he feels "much much better. Vital signs stable. Not febrile or tachycardic. Had Cipro this morning. We'll  continue his Cipro twice a day. Recheck here fever, decreased output, other symptoms.  Otherwise, he needs to follow-up with his cardiologist for completion of his testing for his abnormal EKG and Lexiscan. Ultimately with urology for definitive procedure for his large left renal pelvic calculus    ED Discharge Orders    None       Tanna Furry, MD 01/10/17 1203

## 2017-01-10 NOTE — ED Triage Notes (Signed)
Patient having left flank pain. Patient has an external nephrostomy tube. Due to a large stone that he need surgery for removal. Patient states the tube stopped draining urine about 3 days ago. Started having kidney pain.

## 2017-01-11 ENCOUNTER — Encounter: Payer: Self-pay | Admitting: Cardiovascular Disease

## 2017-01-11 ENCOUNTER — Ambulatory Visit (INDEPENDENT_AMBULATORY_CARE_PROVIDER_SITE_OTHER): Payer: Self-pay | Admitting: Cardiovascular Disease

## 2017-01-11 VITALS — BP 92/58 | HR 65 | Ht 69.0 in | Wt 175.4 lb

## 2017-01-11 DIAGNOSIS — Z01812 Encounter for preprocedural laboratory examination: Secondary | ICD-10-CM

## 2017-01-11 DIAGNOSIS — Z85819 Personal history of malignant neoplasm of unspecified site of lip, oral cavity, and pharynx: Secondary | ICD-10-CM

## 2017-01-11 DIAGNOSIS — N200 Calculus of kidney: Secondary | ICD-10-CM

## 2017-01-11 DIAGNOSIS — Z01818 Encounter for other preprocedural examination: Secondary | ICD-10-CM

## 2017-01-11 DIAGNOSIS — I10 Essential (primary) hypertension: Secondary | ICD-10-CM

## 2017-01-11 DIAGNOSIS — Z720 Tobacco use: Secondary | ICD-10-CM

## 2017-01-11 DIAGNOSIS — R931 Abnormal findings on diagnostic imaging of heart and coronary circulation: Secondary | ICD-10-CM

## 2017-01-11 MED ORDER — HYDRALAZINE HCL 25 MG PO TABS: 50.0000 mg | ORAL_TABLET | Freq: Three times a day (TID) | ORAL | 4 refills | Status: DC

## 2017-01-11 NOTE — Patient Instructions (Addendum)
Medication Instructions:  DECREASE hydralazine to 50 mg (2 tablets) three times daily.  Labwork: TODAY (BMET, CBC, PT/INR)  Testing/Procedures: A chest x-ray takes a picture of the organs and structures inside the chest, including the heart, lungs, and blood vessels. This test can show several things, including, whether the heart is enlarges; whether fluid is building up in the lungs; and whether pacemaker / defibrillator leads are still in place. This will be completed at [facility] appointment needed.  Follow-Up: Your physician recommends that you schedule a follow-up appointment in: 1-2 weeks after Catheterization with Dr. Claiborne Billings.    Any Other Special Instructions Will Be Listed Below (If Applicable).     If you need a refill on your cardiac medications before your next appointment, please call your pharmacy.    [facility] [address] [place] [address] Dept: [phone] Loc: [phone]  [name] [name]  01/11/2017  You are scheduled for a Cardiac Catheterization on Tuesday, November 20 with Dr. Shelva Majestic.  1. Please arrive at the [facility] (Main Entrance A) at [facility]: [address] [place], [place] [address] at 5:30 AM (two hours before your procedure to ensure your preparation). Free valet parking service is available.   Special note: Every effort is made to have your procedure done on time. Please understand that emergencies sometimes delay scheduled procedures.  2. Diet: Do not eat or drink anything after midnight prior to your procedure except sips of water to take medications.  3. Labs: You will need to have blood drawn on Tuesday, November 13 at [place], [place]  Open: [place] (Lunch 12:30 - 1:30)   Phone: [phone]. You do not need to be fasting.  4. Medication instructions in preparation for your  procedure:  On the morning of your procedure, take your Aspirin and any morning medicines NOT listed above.  You may use sips of water.  5. Plan for one night stay--bring personal belongings. 6. Bring a current list of your medications and current insurance cards. 7. You MUST have a responsible person to drive you home. 8. Someone MUST be with you the first 24 hours after you arrive home or your discharge will be delayed. 9. Please wear clothes that are easy to get on and off and wear slip-on shoes.  Thank you for allowing [place] to care for you!   -- [facility]

## 2017-01-11 NOTE — Progress Notes (Signed)
Cardiology Office Note    Date:  01/12/2017   ID:  [name], DOB 1955-06-28, MRN [id number]  PCP:  Scot Jun, FNP  Cardiologist:  Shelva Majestic, MD   No chief complaint on file.  Cardiology consultation, referred through the courtesy of Dr. Karsten Ro for preoperative clearance following demonstration of an abnormal ECG.  History of Present Illness:  [name] [name] is a 61 y.o. male who is referred for preoperative clearance by Dr. Karsten Ro prior to undergoing urologic surgery.  [name] [name] has a remote history of throat cancer and underwent chemotherapy and radiation therapy.  He has a history of hypertension, as well as long-standing tobaccouse.  He started smoking at age 89 and was smoking up to 3 packs per day for many years.  Recently, he is smoking one half pack per day.  He has a history of hypothyroidism and is on levothyroxine.  He has a history of hypertension, and most recently has been on amlodipine 10 mg, hydralazine 75 mg every 8 hours, and metoprolol 12.5 mg twice a day.  He denies any recent definitive chest pain but he does admit that he is unable to walk much due to right hip discomfort.  September, he been hospitalized with left flank pain.  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  He underwent left percutaneous nephrostomy.  He is in need for additional surgery was initially scheduled to be October 22, but when he presented on October 19 for preoperative clearance his surgery was canceled after his ECG revealed anterolateral T-wave changes.    When I saw him for initial cardiology evaluation I scheduled him for 2D echo Doppler study to further evaluate his systolic murmur as well as systolic and diastolic function with his history of hypertension on a 3 drug regimen.  I also schedule him for a nuclear study to assess potential ischemic etiology to his ECG changes.  His echo Doppler study showed an EF of 55-60%.  He had normal diastolic  parameters.  There was mild mitral regurgitation.  His nuclear stress test was abnormal and interpreted as high risk demonstrating a medium defect of moderate severity in the mid anteroseptal, apical anterior, apical septal and apical location with mild peri-infarction ischemia.  EF was 49%.  He tells me yesterday he underwent reinsertion of his nephrostomy tube.  He denies any episodes of chest pain but admits to shortness of breath with activity.  He has noticed some mild lightheadedness.  He presents for follow-up evaluation  Past Medical History:  Diagnosis Date  . Arthritis   . Depression 06/12/11   Buproprion per Dr. Lamonte Sakai  . Full dentures    Fitting Per Dr. Enrique Sack  . Hypertension   . 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 Protein calorie malnutrition ([place]) 05/31/11  . Renal insufficiency 2012   Secondary to Hx. of Cisplatin and Dhydrtion  . Smoking 06/12/11   1/2 pack/day  . Status post chemotherapy 10/30/10 - 11/09/10    2 doses of Q 3 week Cisplatin  . Status post radiation therapy 10/11/10 - 12/24/10   Bilateral Neck and Mucosa axis /  70 gray in 35 fractions  . Xerostomia     Past Surgical History:  Procedure Laterality Date  . APPENDECTOMY    . BIOPSY TONGUE     TonsilandBaseoftongue  . IR NEPHROSTOMY EXCHANGE LEFT  01/10/2017  . IR NEPHROSTOMY PLACEMENT LEFT  11/15/2016  . PEG TUBE REMOVAL  04/13/11    Current  Medications: Outpatient Medications Prior to Visit  Medication Sig Dispense Refill  . amLODipine (NORVASC) 10 MG tablet Take 1 tablet (10 mg total) daily by mouth. 30 tablet 6  . ciprofloxacin (CIPRO) 500 MG tablet Take 1 tablet (500 mg total) 2 (two) times daily by mouth. (Patient taking differently: Take 500 mg 2 (two) times daily by mouth. Started 11/05 for 7 days) 14 tablet 0  . levothyroxine (SYNTHROID, LEVOTHROID) 100 MCG tablet Take 1 tablet (100 mcg total) daily before breakfast by mouth. 30 tablet 6  . metoprolol tartrate (LOPRESSOR) 25 MG tablet Take 0.5 tablets  (12.5 mg total) 2 (two) times daily by mouth. 60 tablet 3  . traMADol (ULTRAM) 50 MG tablet Take 1 tablet (50 mg total) every 8 (eight) hours as needed by mouth. (Patient taking differently: Take 50 mg every 8 (eight) hours as needed by mouth for moderate pain. ) 20 tablet 0  . hydrALAZINE (APRESOLINE) 25 MG tablet Take 3 tablets (75 mg total) every 8 (eight) hours by mouth. 270 tablet 4   No facility-administered medications prior to visit.      Allergies:   Codeine   Social History   Socioeconomic History  . Marital status: Single    Spouse name: None  . Number of children: None  . Years of education: None  . Highest education level: None  Social Needs  . Financial resource strain: None  . Food insecurity - worry: None  . Food insecurity - inability: None  . Transportation needs - medical: None  . Transportation needs - non-medical: None  Occupational History  . None  Tobacco Use  . Smoking status: Current Every Day Smoker    Packs/day: 0.25    Years: 25.00    Pack years: 6.25    Types: Cigarettes  . Smokeless tobacco: Never Used  . Tobacco comment: Smoking 4-5 cigarettes daily on average  Substance and Sexual Activity  . Alcohol use: No    Alcohol/week: 0.0 oz  . Drug use: No  . Sexual activity: None  Other Topics Concern  . None  Social History Narrative  . None    Additional social history is notable that he is divorced and lives with one of his daughters.  He has 3 children and 6 grandchildren.  He worked for [employer] but is retired.there is a long-standing tobacco history since age 52. He does not exercise.  Family History:  The patient's family history includes Cancer (age of onset: 47) in his sister; Cancer (age of onset: 31) in his father.  His mother died at age 53.  Father died at age 69 and had diabetes and CAD.  He has 2 brothers, 2 living sisters, and one sister who died at age 40 with some cancer of her blood.  He has 3 children.  ROS General:  Negative; No fevers, chills, or night sweats;  HEENT: Negative; No changes in vision or hearing, sinus congestion, difficulty swallowing Pulmonary: Negative; No cough, wheezing, shortness of breath, hemoptysis Cardiovascular: Negative; No chest pain, presyncope, syncope, palpitations GI: Negative; No nausea, vomiting, diarrhea, or abdominal pain GU: recent left-sided flank pain, kidney stones, and hydronephrosis Musculoskeletal: hip discomfort Hematologic/Oncology: Negative; no easy bruising, bleeding Endocrine: Negative; no heat/cold intolerance; no diabetes Neuro: Negative; no changes in balance, headaches Skin: Negative; No rashes or skin lesions Psychiatric: Negative; No behavioral problems, depression Sleep: Negative; No snoring, daytime sleepiness, hypersomnolence, bruxism, restless legs, hypnogognic hallucinations, no cataplexy Other comprehensive 14 point system review is negative.  PHYSICAL EXAM:   VS:  BP (!) 92/58   Pulse 65   Ht 5' 9"  (1.753 m)   Wt 175 lb 6.4 oz (79.6 kg)   BMI 25.90 kg/m     Repeat blood pressure by me was 106/62 supine and 90/60 standing.  Wt Readings from Last 3 Encounters:  01/11/17 175 lb 6.4 oz (79.6 kg)  01/10/17 176 lb (79.8 kg)  01/07/17 172 lb (78 kg)    General: Alert, oriented, no distress.  Skin: normal turgor, no rashes, warm and dry HEENT: Normocephalic, atraumatic. Pupils equal round and reactive to light; sclera anicteric; extraocular muscles intact;  Nose without nasal septal hypertrophy Mouth/Parynx benign; Mallinpatti scale 3 Neck: No JVD, no carotid bruits; normal carotid upstroke Lungs: clear to ausculatation and percussion; no wheezing or rales Chest wall: without tenderness to palpitation Heart: PMI not displaced, RRR, s1 s2 normal, 1/6 systolic murmur, no diastolic murmur, no rubs, gallops, thrills, or heaves Abdomen: soft, nontender; no hepatosplenomehaly, BS+; abdominal aorta nontender and not dilated by palpation.   Left nephrostomy tube in place  Back: no CVA tenderness Pulses 2+ Musculoskeletal: full range of motion, normal strength, no joint deformities Extremities: no clubbing cyanosis or edema, Homan's sign negative  Neurologic: grossly nonfocal; Cranial nerves grossly wnl Psychologic: Normal mood and affect    Studies/Labs Reviewed:   EKG:  EKG is ordered today. ECG (independently read by me): Normal sinus rhythm at 65 bpm.  Nonspecific T-wave abnormality anteriorly. Normal intervals.  12/21/2016 ECG (independently read by me): Normal sinus rhythm at 63 bpm.  T-wave abnormality anteriorly V1 through V5.  Normal intervals.  No ectopy.  Recent Labs: BMP Latest Ref Rng & Units 01/11/2017 01/10/2017 01/03/2017  Glucose 65 - 99 mg/dL 77 112(H) 102(H)  BUN 8 - 27 mg/dL 15 13 16   Creatinine 0.76 - 1.27 mg/dL 1.42(H) 1.57(H) 1.51(H)  BUN/Creat Ratio 10 - 24 11 - 11  Sodium 134 - 144 mmol/L 143 138 141  Potassium 3.5 - 5.2 mmol/L 3.9 3.7 4.6  Chloride 96 - 106 mmol/L 107(H) 110 109  CO2 20 - 29 mmol/L 20 20(L) 23  Calcium 8.6 - 10.2 mg/dL 9.2 8.5(L) 9.2     Hepatic Function Latest Ref Rng & Units 01/03/2017 11/14/2016 07/18/2015  Total Protein 6.1 - 8.1 g/dL 7.0 7.2 6.5  Albumin 3.5 - 5.0 g/dL - 3.8 3.9  AST 10 - 35 U/L 10 17 9(L)  ALT 9 - 46 U/L 11 13(L) 9  Alk Phosphatase 38 - 126 U/L - 86 81  Total Bilirubin 0.2 - 1.2 mg/dL 0.4 0.5 0.3    CBC Latest Ref Rng & Units 01/11/2017 01/10/2017 01/03/2017  WBC 3.4 - 10.8 x10E3/uL 10.2 10.2 CANCELED  Hemoglobin 13.0 - 17.7 g/dL 12.6(L) 12.3(L) -  Hematocrit 37.5 - 51.0 % 38.6 38.4(L) -  Platelets 150 - 379 x10E3/uL 256 219 -   Lab Results  Component Value Date   MCV 83 01/11/2017   MCV 83.7 01/10/2017   MCV 81.2 01/03/2017   Lab Results  Component Value Date   TSH 3.50 01/03/2017   No results found for: HGBA1C   BNP No results found for: BNP  ProBNP No results found for: PROBNP   Lipid Panel     Component Value Date/Time    CHOL 173 01/03/2017 1108   TRIG 139 01/03/2017 1108   HDL 28 (L) 01/03/2017 1108   CHOLHDL 6.2 (H) 01/03/2017 1108   VLDL 36 (H) 07/18/2015 1005   LDLCALC 98  07/18/2015 1005     RADIOLOGY: US Renal  Result Date: 01/10/2017 CLINICAL DATA:  Left kidney stone. EXAM: RENAL / URINARY TRACT ULTRASOUND COMPLETE COMPARISON:  CT scan of November 14, 2016. FINDINGS: Right Kidney: Length: 11 cm. Echogenicity within normal limits. No mass or hydronephrosis visualized. Left Kidney: Length: 12.7 cm. 13 mm calculus seen in lower pole calyx. Echogenicity within normal limits. No mass or hydronephrosis visualized. Bladder: Appears normal for degree of bladder distention. Prostate gland is moderately enlarged. IMPRESSION: Nonobstructive left renal calculus. No hydronephrosis or renal obstruction is noted. Moderate prostatic enlargement. Electronically Signed   By: Marijo Conception, M.D.   On: 01/10/2017 09:55   Ir Nephrostomy Exchange Left  Result Date: 01/10/2017 INDICATION: 61 year old with a large left renal pelvic stone. The patient has a left nephrostomy tube. The tube has not been draining for 4 days and now he has left flank pain. Plan for nephrostomy tube injection and possible exchange. EXAM: EXCHANGE OF LEFT NEPHROSTOMY TUBE WITH FLUOROSCOPY Physician: Stephan Minister. Anselm Pancoast, MD COMPARISON:  None. MEDICATIONS: None ANESTHESIA/SEDATION: None CONTRAST:  10 mL - administered into the collecting system(s) FLUOROSCOPY TIME:  Fluoroscopy Time: 1 minute 36 seconds (17 mGy). COMPLICATIONS: None immediate. PROCEDURE: The procedure was explained to the patient. The risks and benefits of the procedure were discussed and the patient's questions were addressed. Informed consent was obtained from the patient. Patient was placed prone on the interventional table. Spot fluoroscopic image was obtained. The left flank was prepped and draped in a sterile fashion. Maximal barrier sterile technique was utilized including caps, mask,  sterile gowns, sterile gloves, sterile drape, hand hygiene and skin antiseptic. Skin around the catheter was anesthetized with 1% lidocaine. Catheter was cut. Bentson wire was advanced through the catheter. Bentson wire advanced easily into the renal collecting system and down the left ureter. Catheter was completely removed and exchanged for 5 [place] Kumpe catheter. Nephrostogram was performed. A new 10 Leonides Schanz catheter was advanced over the wire and reconstituted in the renal pelvis. Additional contrast was injected to confirm placement in the renal pelvis. Catheter was sutured to skin. FINDINGS: Large stone at the renal pelvis without significant hydronephrosis. Catheter access is between the mid and lower pole infundibula. IMPRESSION: Successful exchange of the left nephrostomy tube with fluoroscopy. Electronically Signed   By: Markus Daft M.D.   On: 01/10/2017 12:06     Additional studies/ records that were reviewed today include:  I reviewed his recent ossicle is a she from September 2018    ASSESSMENT:    1. Abnormal nuclear cardiac imaging test   2. Pre-operative clearance   3. Essential hypertension   4. Pre-procedure lab exam   5. History of throat cancer   6. Nephrolithiasis   7. Tobacco abuse      PLAN:  [name] [name] is a 61 year old male who has a long-standing history of tobacco use, as well as a several year history of hypertension and hypothyroidism.    He also is felt to have stage III chronic kidney disease.  Remotely he had throat cancer and underwent chemotherapy and radiation and still smokes cigarettes.  He has been bothered with kidney stones leading to recent development of hydronephrosis requiring percutaneous nephrostomy.  He is in need for follow-up urologic surgery.  he has been recently found to have T wave abnormality anteriorly on ECG preoperative testing.  I reviewed his echo Doppler study with him in detail.  He has normal systolic function and  normal diastolic parameters.  There was mild mitral regurgitation.  On his nuclear stress study which I reviewed with him in detail today, he has had definite region of hypoperfusion involving the mid anteroseptal, apical anterior, apical septal, and apical locations.  There is mild peri-infarction ischemia.  The study is high risk.  Post stress ejection fraction was 49%.  His blood pressure today is low.  He has been on amlodipine 10 mg hydralazine 75 mg every 8 hours in addition to metoprolol 12.5 mg twice a day.  I am reducing hydralazine to 50 mg every 8 hours.  I showed him his nuclear imaging study I reviewed the pictures with him in detail.  Based on findings highly suggestive of LAD scar/ischemia I have recommended definitive cardiac catheterization.I have reviewed the risks, indications, and alternatives to cardiac catheterization, possible angioplasty, and stenting with the patient. Risks include but are not limited to bleeding, infection, vascular injury, stroke, myocardial infection, arrhythmia, kidney injury, radiation-related injury in the case of prolonged fluoroscopy use, emergency cardiac surgery, and death. The patient understands the risks of serious complication is 1-2 in 3710 with diagnostic cardiac cath and 1-2% or less with angioplasty/stenting.  He has agreed to undergo this procedure and this will be scheduled to be done next week at [facility].  I have recommended that his surgery be deferred until his catheterization study is complete.  I will discussed with Dr. Karsten Ro.   Medication Adjustments/Labs and Tests Ordered: Current medicines are reviewed at length with the patient today.  Concerns regarding medicines are outlined above.  Medication changes, Labs and Tests ordered today are listed in the Patient Instructions below. Patient Instructions  Medication Instructions:  DECREASE hydralazine to 50 mg (2 tablets) three times daily.  Labwork: TODAY (BMET, CBC,  PT/INR)  Testing/Procedures: A chest x-ray takes a picture of the organs and structures inside the chest, including the heart, lungs, and blood vessels. This test can show several things, including, whether the heart is enlarges; whether fluid is building up in the lungs; and whether pacemaker / defibrillator leads are still in place. This will be completed at [facility] appointment needed.  Follow-Up: Your physician recommends that you schedule a follow-up appointment in: 1-2 weeks after Catheterization with Dr. Claiborne Billings.    Any Other Special Instructions Will Be Listed Below (If Applicable).     If you need a refill on your cardiac medications before your next appointment, please call your pharmacy.    [facility] [address] [place] [address] Dept: [phone] Loc: [phone]  [name] [name]  01/11/2017  You are scheduled for a Cardiac Catheterization on Tuesday, November 20 with Dr. Shelva Majestic.  1. Please arrive at the [facility] (Main Entrance A) at [facility]: [address] [place], [place] [address] at 5:30 AM (two hours before your procedure to ensure your preparation). Free valet parking service is available.   Special note: Every effort is made to have your procedure done on time. Please understand that emergencies sometimes delay scheduled procedures.  2. Diet: Do not eat or drink anything after midnight prior to your procedure except sips of water to take medications.  3. Labs: You will need to have blood drawn on Tuesday, November 13 at [place], [place]  Open: [place] (Lunch 12:30 - 1:30)   Phone: [phone]. You do not need to be fasting.  4. Medication  instructions in preparation for your procedure:  On the morning of your procedure, take your Aspirin and any morning medicines NOT listed above.  You may use  sips of water.  5. Plan for one night stay--bring personal belongings. 6. Bring a current list of your medications and current insurance cards. 7. You MUST have a responsible person to drive you home. 8. Someone MUST be with you the first 24 hours after you arrive home or your discharge will be delayed. 9. Please wear clothes that are easy to get on and off and wear slip-on shoes.  Thank you for allowing [place] to care for you!   -- [facility]     Signed, Shelva Majestic, MD  01/12/2017 2:40 PM    [facility] [address] [place], [place], [place]  [address] Phone: [phone]

## 2017-01-11 NOTE — H&P (View-Only) (Signed)
Cardiology Office Note    Date:  01/12/2017   ID:  [name] [name], DOB 1955-06-28, MRN [id number]  PCP:  [name] Jun, FNP  Cardiologist:  [name] Majestic, MD   No chief complaint on file.  Cardiology consultation, referred through the courtesy of Dr. Karsten [name] for preoperative clearance following demonstration of an abnormal ECG.  History of Present Illness:  [name] [name] is a 61 y.o. male who is referred for preoperative clearance by Dr. Karsten [name] prior to undergoing urologic surgery.  [name] has a remote history of throat cancer and underwent chemotherapy and radiation therapy.  He has a history of hypertension, as well as long-standing tobaccouse.  He started smoking at age 89 and was smoking up to 3 packs per day for many years.  Recently, he is smoking one half pack per day.  He has a history of hypothyroidism and is on levothyroxine.  He has a history of hypertension, and most recently has been on amlodipine 10 mg, hydralazine 75 mg every 8 hours, and metoprolol 12.5 mg twice a day.  He denies any recent definitive chest pain but he does admit that he is unable to walk much due to right hip discomfort.  September, he been hospitalized with left flank pain.  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  He underwent left percutaneous nephrostomy.  He is in need for additional surgery was initially scheduled to be October 22, but when he presented on October 19 for preoperative clearance his surgery was canceled after his ECG revealed anterolateral T-wave changes.    When I saw him for initial cardiology evaluation I scheduled him for 2D echo Doppler study to further evaluate his systolic murmur as well as systolic and diastolic function with his history of hypertension on a 3 drug regimen.  I also schedule him for a nuclear study to assess potential ischemic etiology to his ECG changes.  His echo Doppler study showed an EF of 55-60%.  He had normal diastolic  parameters.  There was mild mitral regurgitation.  His nuclear stress test was abnormal and interpreted as high risk demonstrating a medium defect of moderate severity in the mid anteroseptal, apical anterior, apical septal and apical location with mild peri-infarction ischemia.  EF was 49%.  He tells me yesterday he underwent reinsertion of his nephrostomy tube.  He denies any episodes of chest pain but admits to shortness of breath with activity.  He has noticed some mild lightheadedness.  He presents for follow-up evaluation  Past Medical History:  Diagnosis Date  . Arthritis   . Depression 06/12/11   Buproprion per Dr. Lamonte [name] [name]  . Full dentures    Fitting Per Dr. Enrique [name]  . Hypertension   . 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 Protein calorie malnutrition ([name] [name]) 05/31/11  . Renal insufficiency 2012   Secondary to Hx. of Cisplatin and Dhydrtion  . Smoking 06/12/11   1/2 pack/day  . Status post chemotherapy 10/30/10 - 11/09/10    2 doses of Q 3 week Cisplatin  . Status post radiation therapy 10/11/10 - 12/24/10   Bilateral Neck and Mucosa axis /  70 gray in 35 fractions  . Xerostomia     Past Surgical History:  Procedure Laterality Date  . APPENDECTOMY    . BIOPSY TONGUE     TonsilandBaseoftongue  . IR NEPHROSTOMY EXCHANGE LEFT  01/10/2017  . IR NEPHROSTOMY PLACEMENT LEFT  11/15/2016  . PEG TUBE REMOVAL  04/13/11    Current  Medications: Outpatient Medications Prior to Visit  Medication Sig Dispense Refill  . amLODipine (NORVASC) 10 MG tablet Take 1 tablet (10 mg total) daily by mouth. 30 tablet 6  . ciprofloxacin (CIPRO) 500 MG tablet Take 1 tablet (500 mg total) 2 (two) times daily by mouth. (Patient taking differently: Take 500 mg 2 (two) times daily by mouth. Started 11/05 for 7 days) 14 tablet 0  . levothyroxine (SYNTHROID, LEVOTHROID) 100 MCG tablet Take 1 tablet (100 mcg total) daily before breakfast by mouth. 30 tablet 6  . metoprolol tartrate (LOPRESSOR) 25 MG tablet Take 0.5 tablets  (12.5 mg total) 2 (two) times daily by mouth. 60 tablet 3  . traMADol (ULTRAM) 50 MG tablet Take 1 tablet (50 mg total) every 8 (eight) hours as needed by mouth. (Patient taking differently: Take 50 mg every 8 (eight) hours as needed by mouth for moderate pain. ) 20 tablet 0  . hydrALAZINE (APRESOLINE) 25 MG tablet Take 3 tablets (75 mg total) every 8 (eight) hours by mouth. 270 tablet 4   No facility-administered medications prior to visit.      Allergies:   Codeine   Social History   Socioeconomic History  . Marital status: Single    Spouse name: None  . Number of children: None  . Years of education: None  . Highest education level: None  Social Needs  . Financial resource strain: None  . Food insecurity - worry: None  . Food insecurity - inability: None  . Transportation needs - medical: None  . Transportation needs - non-medical: None  Occupational History  . None  Tobacco Use  . Smoking status: Current Every Day Smoker    Packs/day: 0.25    Years: 25.00    Pack years: 6.25    Types: Cigarettes  . Smokeless tobacco: Never Used  . Tobacco comment: Smoking 4-5 cigarettes daily on average  Substance and Sexual Activity  . Alcohol use: No    Alcohol/week: 0.0 oz  . Drug use: No  . Sexual activity: None  Other Topics Concern  . None  Social History Narrative  . None    Additional social history is notable that he is divorced and lives with one of his daughters.  He has 3 children and 6 grandchildren.  He worked for [employer] but is retired.there is a long-standing tobacco history since age 52. He does not exercise.  Family History:  The patient's family history includes Cancer (age of onset: 47) in his sister; Cancer (age of onset: 31) in his father.  His mother died at age 53.  Father died at age 69 and had diabetes and CAD.  He has 2 brothers, 2 living sisters, and one sister who died at age 40 with some cancer of her blood.  He has 3 children.  ROS General:  Negative; No fevers, chills, or night sweats;  HEENT: Negative; No changes in vision or hearing, sinus congestion, difficulty swallowing Pulmonary: Negative; No cough, wheezing, shortness of breath, hemoptysis Cardiovascular: Negative; No chest pain, presyncope, syncope, palpitations GI: Negative; No nausea, vomiting, diarrhea, or abdominal pain GU: recent left-sided flank pain, kidney stones, and hydronephrosis Musculoskeletal: hip discomfort Hematologic/Oncology: Negative; no easy bruising, bleeding Endocrine: Negative; no heat/cold intolerance; no diabetes Neuro: Negative; no changes in balance, headaches Skin: Negative; No rashes or skin lesions Psychiatric: Negative; No behavioral problems, depression Sleep: Negative; No snoring, daytime sleepiness, hypersomnolence, bruxism, restless legs, hypnogognic hallucinations, no cataplexy Other comprehensive 14 point system review is negative.  PHYSICAL EXAM:   VS:  BP (!) 92/58   Pulse 65   Ht 5' 9"  (1.753 m)   Wt 175 lb 6.4 oz (79.6 kg)   BMI 25.90 kg/m     Repeat blood pressure by me was 106/62 supine and 90/60 standing.  Wt Readings from Last 3 Encounters:  01/11/17 175 lb 6.4 oz (79.6 kg)  01/10/17 176 lb (79.8 kg)  01/07/17 172 lb (78 kg)    General: Alert, oriented, no distress.  Skin: normal turgor, no rashes, warm and dry HEENT: Normocephalic, atraumatic. Pupils equal round and reactive to light; sclera anicteric; extraocular muscles intact;  Nose without nasal septal hypertrophy Mouth/Parynx benign; Mallinpatti scale 3 Neck: No JVD, no carotid bruits; normal carotid upstroke Lungs: clear to ausculatation and percussion; no wheezing or rales Chest wall: without tenderness to palpitation Heart: PMI not displaced, RRR, s1 s2 normal, 1/6 systolic murmur, no diastolic murmur, no rubs, gallops, thrills, or heaves Abdomen: soft, nontender; no hepatosplenomehaly, BS+; abdominal aorta nontender and not dilated by palpation.   Left nephrostomy tube in place  Back: no CVA tenderness Pulses 2+ Musculoskeletal: full range of motion, normal strength, no joint deformities Extremities: no clubbing cyanosis or edema, Homan's sign negative  Neurologic: grossly nonfocal; Cranial nerves grossly wnl Psychologic: Normal mood and affect    Studies/Labs Reviewed:   EKG:  EKG is ordered today. ECG (independently read by me): Normal sinus rhythm at 65 bpm.  Nonspecific T-wave abnormality anteriorly. Normal intervals.  12/21/2016 ECG (independently read by me): Normal sinus rhythm at 63 bpm.  T-wave abnormality anteriorly V1 through V5.  Normal intervals.  No ectopy.  Recent Labs: BMP Latest Ref Rng & Units 01/11/2017 01/10/2017 01/03/2017  Glucose 65 - 99 mg/dL 77 112(H) 102(H)  BUN 8 - 27 mg/dL 15 13 16   Creatinine 0.76 - 1.27 mg/dL 1.42(H) 1.57(H) 1.51(H)  BUN/Creat Ratio 10 - 24 11 - 11  Sodium 134 - 144 mmol/L 143 138 141  Potassium 3.5 - 5.2 mmol/L 3.9 3.7 4.6  Chloride 96 - 106 mmol/L 107(H) 110 109  CO2 20 - 29 mmol/L 20 20(L) 23  Calcium 8.6 - 10.2 mg/dL 9.2 8.5(L) 9.2     Hepatic Function Latest Ref Rng & Units 01/03/2017 11/14/2016 07/18/2015  Total Protein 6.1 - 8.1 g/dL 7.0 7.2 6.5  Albumin 3.5 - 5.0 g/dL - 3.8 3.9  AST 10 - 35 U/L 10 17 9(L)  ALT 9 - 46 U/L 11 13(L) 9  Alk Phosphatase 38 - 126 U/L - 86 81  Total Bilirubin 0.2 - 1.2 mg/dL 0.4 0.5 0.3    CBC Latest Ref Rng & Units 01/11/2017 01/10/2017 01/03/2017  WBC 3.4 - 10.8 x10E3/uL 10.2 10.2 CANCELED  Hemoglobin 13.0 - 17.7 g/dL 12.6(L) 12.3(L) -  Hematocrit 37.5 - 51.0 % 38.6 38.4(L) -  Platelets 150 - 379 x10E3/uL 256 219 -   Lab Results  Component Value Date   MCV 83 01/11/2017   MCV 83.7 01/10/2017   MCV 81.2 01/03/2017   Lab Results  Component Value Date   TSH 3.50 01/03/2017   No results found for: HGBA1C   BNP No results found for: BNP  ProBNP No results found for: PROBNP   Lipid Panel     Component Value Date/Time    CHOL 173 01/03/2017 1108   TRIG 139 01/03/2017 1108   HDL 28 (L) 01/03/2017 1108   CHOLHDL 6.2 (H) 01/03/2017 1108   VLDL 36 (H) 07/18/2015 1005   LDLCALC 98  07/18/2015 1005     RADIOLOGY: US Renal  Result Date: 01/10/2017 CLINICAL DATA:  Left kidney stone. EXAM: RENAL / URINARY TRACT ULTRASOUND COMPLETE COMPARISON:  CT scan of November 14, 2016. FINDINGS: Right Kidney: Length: 11 cm. Echogenicity within normal limits. No mass or hydronephrosis visualized. Left Kidney: Length: 12.7 cm. 13 mm calculus seen in lower pole calyx. Echogenicity within normal limits. No mass or hydronephrosis visualized. Bladder: Appears normal for degree of bladder distention. Prostate gland is moderately enlarged. IMPRESSION: Nonobstructive left renal calculus. No hydronephrosis or renal obstruction is noted. Moderate prostatic enlargement. Electronically Signed   By: Marijo Conception, M.D.   On: 01/10/2017 09:55   Ir Nephrostomy Exchange Left  Result Date: 01/10/2017 INDICATION: 61 year old with a large left renal pelvic stone. The patient has a left nephrostomy tube. The tube has not been draining for 4 days and now he has left flank pain. Plan for nephrostomy tube injection and possible exchange. EXAM: EXCHANGE OF LEFT NEPHROSTOMY TUBE WITH FLUOROSCOPY Physician: Stephan Minister. Anselm Pancoast, MD COMPARISON:  None. MEDICATIONS: None ANESTHESIA/SEDATION: None CONTRAST:  10 mL - administered into the collecting system(s) FLUOROSCOPY TIME:  Fluoroscopy Time: 1 minute 36 seconds (17 mGy). COMPLICATIONS: None immediate. PROCEDURE: The procedure was explained to the patient. The risks and benefits of the procedure were discussed and the patient's questions were addressed. Informed consent was obtained from the patient. Patient was placed prone on the interventional table. Spot fluoroscopic image was obtained. The left flank was prepped and draped in a sterile fashion. Maximal barrier sterile technique was utilized including caps, mask,  sterile gowns, sterile gloves, sterile drape, hand hygiene and skin antiseptic. Skin around the catheter was anesthetized with 1% lidocaine. Catheter was cut. Bentson wire was advanced through the catheter. Bentson wire advanced easily into the renal collecting system and down the left ureter. Catheter was completely removed and exchanged for 5 [place] Kumpe catheter. Nephrostogram was performed. A new 10 Leonides Schanz catheter was advanced over the wire and reconstituted in the renal pelvis. Additional contrast was injected to confirm placement in the renal pelvis. Catheter was sutured to skin. FINDINGS: Large stone at the renal pelvis without significant hydronephrosis. Catheter access is between the mid and lower pole infundibula. IMPRESSION: Successful exchange of the left nephrostomy tube with fluoroscopy. Electronically Signed   By: Markus Daft M.D.   On: 01/10/2017 12:06     Additional studies/ records that were reviewed today include:  I reviewed his recent ossicle is a she from September 2018    ASSESSMENT:    1. Abnormal nuclear cardiac imaging test   2. Pre-operative clearance   3. Essential hypertension   4. Pre-procedure lab exam   5. History of throat cancer   6. Nephrolithiasis   7. Tobacco abuse      PLAN:  Mr. [name] [name] is a 61 year old male who has a long-standing history of tobacco use, as well as a several year history of hypertension and hypothyroidism.    He also is felt to have stage III chronic kidney disease.  Remotely he had throat cancer and underwent chemotherapy and radiation and still smokes cigarettes.  He has been bothered with kidney stones leading to recent development of hydronephrosis requiring percutaneous nephrostomy.  He is in need for follow-up urologic surgery.  he has been recently found to have T wave abnormality anteriorly on ECG preoperative testing.  I reviewed his echo Doppler study with him in detail.  He has normal systolic function and  normal diastolic parameters.  There was mild mitral regurgitation.  On his nuclear stress study which I reviewed with him in detail today, he has had definite region of hypoperfusion involving the mid anteroseptal, apical anterior, apical septal, and apical locations.  There is mild peri-infarction ischemia.  The study is high risk.  Post stress ejection fraction was 49%.  His blood pressure today is low.  He has been on amlodipine 10 mg hydralazine 75 mg every 8 hours in addition to metoprolol 12.5 mg twice a day.  I am reducing hydralazine to 50 mg every 8 hours.  I showed him his nuclear imaging study I reviewed the pictures with him in detail.  Based on findings highly suggestive of LAD scar/ischemia I have recommended definitive cardiac catheterization.I have reviewed the risks, indications, and alternatives to cardiac catheterization, possible angioplasty, and stenting with the patient. Risks include but are not limited to bleeding, infection, vascular injury, stroke, myocardial infection, arrhythmia, kidney injury, radiation-related injury in the case of prolonged fluoroscopy use, emergency cardiac surgery, and death. The patient understands the risks of serious complication is 1-2 in 3710 with diagnostic cardiac cath and 1-2% or less with angioplasty/stenting.  He has agreed to undergo this procedure and this will be scheduled to be done next week at [facility].  I have recommended that his surgery be deferred until his catheterization study is complete.  I will discussed with Dr. Karsten [name].   Medication Adjustments/Labs and Tests Ordered: Current medicines are reviewed at length with the patient today.  Concerns regarding medicines are outlined above.  Medication changes, Labs and Tests ordered today are listed in the Patient Instructions below. Patient Instructions  Medication Instructions:  DECREASE hydralazine to 50 mg (2 tablets) three times daily.  Labwork: TODAY (BMET, CBC,  PT/INR)  Testing/Procedures: A chest x-ray takes a picture of the organs and structures inside the chest, including the heart, lungs, and blood vessels. This test can show several things, including, whether the heart is enlarges; whether fluid is building up in the lungs; and whether pacemaker / defibrillator leads are still in place. This will be completed at [facility] appointment needed.  Follow-Up: Your physician recommends that you schedule a follow-up appointment in: 1-2 weeks after Catheterization with Dr. Claiborne Billings.    Any Other Special Instructions Will Be Listed Below (If Applicable).     If you need a refill on your cardiac medications before your next appointment, please call your pharmacy.    [facility] [address] [place] [address] Dept: [phone] Loc: [phone]  LEDARIUS LEESON  01/11/2017  You are scheduled for a Cardiac Catheterization on Tuesday, November 20 with Dr. Shelva [name].  1. Please arrive at the [facility] (Main Entrance A) at [facility]: [address] [place], [place] [address] at 5:30 AM (two hours before your procedure to ensure your preparation). Free valet parking service is available.   Special note: Every effort is made to have your procedure done on time. Please understand that emergencies sometimes delay scheduled procedures.  2. Diet: Do not eat or drink anything after midnight prior to your procedure except sips of water to take medications.  3. Labs: You will need to have blood drawn on Tuesday, November 13 at [place], [place]  Open: [place] (Lunch 12:30 - 1:30)   Phone: [phone]. You do not need to be fasting.  4. Medication  instructions in preparation for your procedure:  On the morning of your procedure, take your Aspirin and any morning medicines NOT listed above.  You may use  sips of water.  5. Plan for one night stay--bring personal belongings. 6. Bring a current list of your medications and current insurance cards. 7. You MUST have a responsible person to drive you home. 8. Someone MUST be with you the first 24 hours after you arrive home or your discharge will be delayed. 9. Please wear clothes that are easy to get on and off and wear slip-on shoes.  Thank you for allowing [place] to care for you!   -- [facility]     Signed, [name] Majestic, MD  01/12/2017 2:40 PM    [facility] [address] [place], [place], [place]  [address] Phone: [phone]

## 2017-01-12 ENCOUNTER — Encounter: Payer: Self-pay | Admitting: Cardiovascular Disease

## 2017-01-12 LAB — CBC
HEMATOCRIT: 38.6 % (ref 37.5–51.0)
HEMOGLOBIN: 12.6 g/dL — AB (ref 13.0–17.7)
MCH: 27.2 pg (ref 26.6–33.0)
MCHC: 32.6 g/dL (ref 31.5–35.7)
MCV: 83 fL (ref 79–97)
Platelets: 256 10*3/uL (ref 150–379)
RBC: 4.63 x10E6/uL (ref 4.14–5.80)
RDW: 15.3 % (ref 12.3–15.4)
WBC: 10.2 10*3/uL (ref 3.4–10.8)

## 2017-01-12 LAB — BASIC METABOLIC PANEL
BUN/Creatinine Ratio: 11 (ref 10–24)
BUN: 15 mg/dL (ref 8–27)
CALCIUM: 9.2 mg/dL (ref 8.6–10.2)
CHLORIDE: 107 mmol/L — AB (ref 96–106)
CO2: 20 mmol/L (ref 20–29)
CREATININE: 1.42 mg/dL — AB (ref 0.76–1.27)
GFR calc non Af Amer: 53 mL/min/{1.73_m2} — ABNORMAL LOW (ref >59)
GFR, EST AFRICAN AMERICAN: 61 mL/min/{1.73_m2} (ref >59)
GLUCOSE: 77 mg/dL (ref 65–99)
Potassium: 3.9 mmol/L (ref 3.5–5.2)
Sodium: 143 mmol/L (ref 134–144)

## 2017-01-12 LAB — PROTIME-INR
INR: 1 (ref 0.8–1.2)
PROTHROMBIN TIME: 10.4 s (ref 9.1–12.0)

## 2017-01-17 ENCOUNTER — Ambulatory Visit
Admission: RE | Admit: 2017-01-17 | Discharge: 2017-01-17 | Disposition: A | Discharge status: Home / Self Care | Payer: Self-pay | Source: Ambulatory Visit | Attending: Cardiovascular Disease | Admitting: Cardiovascular Disease

## 2017-01-17 ENCOUNTER — Telehealth: Payer: Self-pay | Admitting: Cardiovascular Disease

## 2017-01-17 DIAGNOSIS — Z01812 Encounter for preprocedural laboratory examination: Secondary | ICD-10-CM

## 2017-01-17 DIAGNOSIS — R931 Abnormal findings on diagnostic imaging of heart and coronary circulation: Secondary | ICD-10-CM

## 2017-01-17 NOTE — Telephone Encounter (Signed)
   Chart reviewed as part of pre-operative protocol coverage. Given past medical history and time since last visit, based on ACC/AHA guidelines, [name] [name] would be at acceptable risk for the planned procedure without further cardiovascular testing.   The patient was seen by Dr. Claiborne Billings 01/11/17 and cleared for surgery. Per review of his medication list during office visit, he is not on any antiplatelet or anticouaglation therapy that need to held.   I will route this recommendation to the requesting party via Epic fax function and remove from pre-op pool.  Please call with questions.  [place], [name] 01/17/2017, 1:45 PM

## 2017-01-17 NOTE — Telephone Encounter (Signed)
° °  [facility] Pre-operative Risk Assessment    Request for surgical clearance:  1. What type of surgery is being performed? PercutaneousNephrolithotomy   2. When is this surgery scheduled? Pending   3. Are there any medications that need to be held prior to surgery and how long?General Cardiac Clearance-Does he need to hold any of his Medications?  4. Practice name and name of physician performing surgery? Dr [name]   5. What is your office phone and fax number? [phone] and fax is [phone]   6. Anesthesia type (None, local, MAC, general) ? General   Glyn Ade 01/17/2017, 9:45 AM  _________________________________________________________________   (provider comments below)

## 2017-01-18 ENCOUNTER — Encounter (HOSPITAL_COMMUNITY)
Admission: RE | Disposition: A | Discharge status: Home / Self Care | Payer: Self-pay | Source: Ambulatory Visit | Attending: Cardiovascular Disease

## 2017-01-18 ENCOUNTER — Ambulatory Visit (HOSPITAL_COMMUNITY)
Admission: RE | Admit: 2017-01-18 | Discharge: 2017-01-18 | Disposition: A | Discharge status: Home / Self Care | Payer: Self-pay | Source: Ambulatory Visit | Attending: Cardiovascular Disease | Admitting: Cardiovascular Disease

## 2017-01-18 DIAGNOSIS — Z923 Personal history of irradiation: Secondary | ICD-10-CM | POA: Insufficient documentation

## 2017-01-18 DIAGNOSIS — F17210 Nicotine dependence, cigarettes, uncomplicated: Secondary | ICD-10-CM | POA: Insufficient documentation

## 2017-01-18 DIAGNOSIS — Z85818 Personal history of malignant neoplasm of other sites of lip, oral cavity, and pharynx: Secondary | ICD-10-CM | POA: Insufficient documentation

## 2017-01-18 DIAGNOSIS — I2510 Atherosclerotic heart disease of native coronary artery without angina pectoris: Secondary | ICD-10-CM | POA: Insufficient documentation

## 2017-01-18 DIAGNOSIS — F329 Major depressive disorder, single episode, unspecified: Secondary | ICD-10-CM | POA: Insufficient documentation

## 2017-01-18 DIAGNOSIS — R9439 Abnormal result of other cardiovascular function study: Secondary | ICD-10-CM

## 2017-01-18 DIAGNOSIS — Z8249 Family history of ischemic heart disease and other diseases of the circulatory system: Secondary | ICD-10-CM | POA: Insufficient documentation

## 2017-01-18 DIAGNOSIS — Z9221 Personal history of antineoplastic chemotherapy: Secondary | ICD-10-CM | POA: Insufficient documentation

## 2017-01-18 DIAGNOSIS — I10 Essential (primary) hypertension: Secondary | ICD-10-CM | POA: Insufficient documentation

## 2017-01-18 DIAGNOSIS — M1990 Unspecified osteoarthritis, unspecified site: Secondary | ICD-10-CM | POA: Insufficient documentation

## 2017-01-18 DIAGNOSIS — N200 Calculus of kidney: Secondary | ICD-10-CM | POA: Insufficient documentation

## 2017-01-18 DIAGNOSIS — E039 Hypothyroidism, unspecified: Secondary | ICD-10-CM | POA: Insufficient documentation

## 2017-01-18 HISTORY — PX: LEFT HEART CATH AND CORONARY ANGIOGRAPHY: CATH118249

## 2017-01-18 SURGERY — LEFT HEART CATH AND CORONARY ANGIOGRAPHY
Anesthesia: LOCAL

## 2017-01-18 MED ORDER — SODIUM CHLORIDE 0.9% FLUSH: 3.0000 mL | INTRAVENOUS | Status: DC | PRN

## 2017-01-18 MED ORDER — SODIUM CHLORIDE 0.9% FLUSH: 3.0000 mL | Freq: Two times a day (BID) | INTRAVENOUS | Status: DC

## 2017-01-18 MED ORDER — ATORVASTATIN CALCIUM 80 MG PO TABS: 80.0000 mg | ORAL_TABLET | Freq: Every day | ORAL | Status: DC

## 2017-01-18 MED ORDER — MIDAZOLAM HCL 2 MG/2ML IJ SOLN
INTRAMUSCULAR | Status: AC
  Filled 2017-01-18: qty 2

## 2017-01-18 MED ORDER — LIDOCAINE HCL (PF) 1 % IJ SOLN
INTRAMUSCULAR | Status: AC
  Filled 2017-01-18: qty 30

## 2017-01-18 MED ORDER — ISOSORBIDE MONONITRATE ER 30 MG PO TB24: 30.0000 mg | ORAL_TABLET | Freq: Every day | ORAL | Status: DC

## 2017-01-18 MED ORDER — SODIUM CHLORIDE 0.9 % IV SOLN: 250.0000 mL | INTRAVENOUS | Status: DC | PRN

## 2017-01-18 MED ORDER — FENTANYL CITRATE (PF) 100 MCG/2ML IJ SOLN
INTRAMUSCULAR | Status: AC
  Filled 2017-01-18: qty 2

## 2017-01-18 MED ORDER — ACETAMINOPHEN 325 MG PO TABS: 650.0000 mg | ORAL_TABLET | ORAL | Status: DC | PRN

## 2017-01-18 MED ORDER — HEPARIN (PORCINE) IN NACL 2-0.9 UNIT/ML-% IJ SOLN
INTRAMUSCULAR | Status: AC | PRN
  Administered 2017-01-18: 1000 mL

## 2017-01-18 MED ORDER — HEPARIN (PORCINE) IN NACL 2-0.9 UNIT/ML-% IJ SOLN
INTRAMUSCULAR | Status: AC
  Filled 2017-01-18: qty 500

## 2017-01-18 MED ORDER — LIDOCAINE HCL (PF) 1 % IJ SOLN
INTRAMUSCULAR | Status: DC | PRN
  Administered 2017-01-18: 2 mL

## 2017-01-18 MED ORDER — HEPARIN SODIUM (PORCINE) 1000 UNIT/ML IJ SOLN
INTRAMUSCULAR | Status: DC | PRN
  Administered 2017-01-18: 4000 [IU] via INTRAVENOUS

## 2017-01-18 MED ORDER — VERAPAMIL HCL 2.5 MG/ML IV SOLN
INTRAVENOUS | Status: DC | PRN
  Administered 2017-01-18: 10 mL via INTRA_ARTERIAL

## 2017-01-18 MED ORDER — IOPAMIDOL (ISOVUE-370) INJECTION 76%
INTRAVENOUS | Status: DC | PRN
  Administered 2017-01-18: 100 mL via INTRA_ARTERIAL

## 2017-01-18 MED ORDER — ONDANSETRON HCL 4 MG/2ML IJ SOLN: 4.0000 mg | Freq: Four times a day (QID) | INTRAMUSCULAR | Status: DC | PRN

## 2017-01-18 MED ORDER — SODIUM CHLORIDE 0.9 % IV SOLN: INTRAVENOUS | Status: DC

## 2017-01-18 MED ORDER — VERAPAMIL HCL 2.5 MG/ML IV SOLN
INTRAVENOUS | Status: AC
  Filled 2017-01-18: qty 2

## 2017-01-18 MED ORDER — SODIUM CHLORIDE 0.9 % WEIGHT BASED INFUSION
3.0000 mL/kg/h | INTRAVENOUS | Status: AC
  Administered 2017-01-18: 3 mL/kg/h via INTRAVENOUS

## 2017-01-18 MED ORDER — ASPIRIN 81 MG PO CHEW
81.0000 mg | CHEWABLE_TABLET | Freq: Every day | ORAL | Status: DC
Start: 2017-01-18 — End: 2017-01-18

## 2017-01-18 MED ORDER — ASPIRIN 81 MG PO CHEW
81.0000 mg | CHEWABLE_TABLET | ORAL | Status: AC
  Administered 2017-01-18: 81 mg via ORAL

## 2017-01-18 MED ORDER — ISOSORBIDE MONONITRATE ER 30 MG PO TB24: 30.0000 mg | ORAL_TABLET | Freq: Every day | ORAL | 0 refills | Status: DC

## 2017-01-18 MED ORDER — IOPAMIDOL (ISOVUE-370) INJECTION 76%
INTRAVENOUS | Status: AC
  Filled 2017-01-18: qty 100

## 2017-01-18 MED ORDER — FENTANYL CITRATE (PF) 100 MCG/2ML IJ SOLN
INTRAMUSCULAR | Status: DC | PRN
  Administered 2017-01-18: 50 ug via INTRAVENOUS

## 2017-01-18 MED ORDER — HEPARIN SODIUM (PORCINE) 1000 UNIT/ML IJ SOLN
INTRAMUSCULAR | Status: AC
  Filled 2017-01-18: qty 1

## 2017-01-18 MED ORDER — SODIUM CHLORIDE 0.9 % WEIGHT BASED INFUSION: 1.0000 mL/kg/h | INTRAVENOUS | Status: DC

## 2017-01-18 MED ORDER — MIDAZOLAM HCL 2 MG/2ML IJ SOLN
INTRAMUSCULAR | Status: DC | PRN
  Administered 2017-01-18: 2 mg via INTRAVENOUS

## 2017-01-18 MED ORDER — ASPIRIN 81 MG PO CHEW
CHEWABLE_TABLET | ORAL | Status: AC
  Filled 2017-01-18: qty 1

## 2017-01-18 MED FILL — ISOSORBIDE MN ER 30 MG TAB: 30 | 30 days supply | Qty: 30 | Fill #0

## 2017-01-18 SURGICAL SUPPLY — 12 items
CATH INFINITI 5FR ANG PIGTAIL (CATHETERS) ×1 IMPLANT
CATH INFINITI JR4 5F (CATHETERS) ×1 IMPLANT
CATH OPTITORQUE TIG 4.0 5F (CATHETERS) ×1 IMPLANT
DEVICE RAD COMP TR BAND LRG (VASCULAR PRODUCTS) ×1 IMPLANT
GLIDESHEATH SLEND SS 6F .021 (SHEATH) ×1 IMPLANT
GUIDEWIRE INQWIRE 1.5J.035X260 (WIRE) IMPLANT
INQWIRE 1.5J .035X260CM (WIRE) ×2
KIT HEART LEFT (KITS) ×2 IMPLANT
PACK CARDIAC CATHETERIZATION (CUSTOM PROCEDURE TRAY) ×2 IMPLANT
SYR MEDRAD MARK V 150ML (SYRINGE) ×2 IMPLANT
TRANSDUCER W/STOPCOCK (MISCELLANEOUS) ×2 IMPLANT
TUBING CIL FLEX 10 FLL-RA (TUBING) ×2 IMPLANT

## 2017-01-18 NOTE — Interval H&P Note (Signed)
Cath Lab Visit (complete for each Cath Lab visit)  Clinical Evaluation Leading to the Procedure:   ACS: No.  Non-ACS:    Anginal Classification: CCS II  Anti-ischemic medical therapy: Maximal Therapy (2 or more classes of medications)  Non-Invasive Test Results: High-risk stress test findings: cardiac mortality >3%/year  Prior CABG: No previous CABG      History and Physical Interval Note:  01/18/2017 7:31 AM  [name] [name]  has presented today for surgery, with the diagnosis of abnormal nuc  The various methods of treatment have been discussed with the patient and family. After consideration of risks, benefits and other options for treatment, the patient has consented to  Procedure(s): LEFT HEART CATH AND CORONARY ANGIOGRAPHY (N/A) as a surgical intervention .  The patient's history has been reviewed, patient examined, no change in status, stable for surgery.  I have reviewed the patient's chart and labs.  Questions were answered to the patient's satisfaction.     Shelva Majestic

## 2017-01-18 NOTE — Discharge Instructions (Signed)
**Note -identified via Obfuscation** Radial Site Care °Refer to this sheet in the next few weeks. These instructions provide you with information about caring for yourself after your procedure. Your health care provider may also give you more specific instructions. Your treatment has been planned according to current medical practices, but problems sometimes occur. Call your health care provider if you have any problems or questions after your procedure. °What can I expect after the procedure? °After your procedure, it is typical to have the following: °· Bruising at the radial site that usually fades within 1-2 weeks. °· Blood collecting in the tissue (hematoma) that may be painful to the touch. It should usually decrease in size and tenderness within 1-2 weeks. ° °Follow these instructions at home: °· Take medicines only as directed by your health care provider. °· You may shower 24-48 hours after the procedure or as directed by your health care provider. Remove the bandage (dressing) and gently wash the site with plain soap and water. Pat the area dry with a clean towel. Do not rub the site, because this may cause bleeding. °· Do not take baths, swim, or use a hot tub until your health care provider approves. °· Check your insertion site every day for redness, swelling, or drainage. °· Do not apply powder or lotion to the site. °· Do not flex or bend the affected arm for 24 hours or as directed by your health care provider. °· Do not push or pull heavy objects with the affected arm for 24 hours or as directed by your health care provider. °· Do not lift over 10 lb (4.5 kg) for 5 days after your procedure or as directed by your health care provider. °· Ask your health care provider when it is okay to: °? Return to work or school. °? Resume usual physical activities or sports. °? Resume sexual activity. °· Do not drive home if you are discharged the same day as the procedure. Have someone else drive you. °· You may drive 24 hours after the procedure  unless otherwise instructed by your health care provider. °· Do not operate machinery or power tools for 24 hours after the procedure. °· If your procedure was done as an outpatient procedure, which means that you went home the same day as your procedure, a responsible adult should be with you for the first 24 hours after you arrive home. °· Keep all follow-up visits as directed by your health care provider. This is important. °Contact a health care provider if: °· You have a fever. °· You have chills. °· You have increased bleeding from the radial site. Hold pressure on the site. °Get help right away if: °· You have unusual pain at the radial site. °· You have redness, warmth, or swelling at the radial site. °· You have drainage (other than a small amount of blood on the dressing) from the radial site. °· The radial site is bleeding, and the bleeding does not stop after 30 minutes of holding steady pressure on the site. °· Your arm or hand becomes pale, cool, tingly, or numb. °This information is not intended to replace advice given to you by your health care provider. Make sure you discuss any questions you have with your health care provider. °Document Released: 03/20/2010 Document Revised: 07/24/2015 Document Reviewed: 09/03/2013 °Elsevier Interactive Patient Education © 2018 [employer] ° °

## 2017-01-19 ENCOUNTER — Encounter (HOSPITAL_COMMUNITY): Payer: Self-pay | Admitting: Cardiovascular Disease

## 2017-01-24 ENCOUNTER — Other Ambulatory Visit: Payer: Self-pay | Admitting: Urology

## 2017-01-24 MED FILL — AMLODIPINE BESYLATE 10 MG T: 10 | 30 days supply | Qty: 30 | Fill #0

## 2017-01-24 MED FILL — ?LEVOTHYROXINE 100 MCG TAB: 100 | 30 days supply | Qty: 30 | Fill #0

## 2017-02-01 ENCOUNTER — Encounter (HOSPITAL_COMMUNITY): Payer: Self-pay

## 2017-02-01 NOTE — Patient Instructions (Signed)
KRAYTON WORTLEY  02/01/2017   Your procedure is scheduled on: 02-11-17  Report to [facility] Main  Entrance Take [place]  elevators to 3rd floor to  [place] at    [place] AM.   Call this number if you have problems the morning of surgery [phone]    Remember: ONLY 1 PERSON MAY GO WITH YOU TO SHORT STAY TO GET  READY MORNING OF [place].  Do not eat food or drink liquids :After Midnight.     Take these medicines the morning of surgery with A SIP OF WATER: metoprolol, levothyroxine, Imdur(isorbide mononitrate), hydralazine, amlodipine                                You may not have any metal on your body including hair pins and              piercings  Do not wear jewelry,  lotions, powders or perfumes, deodorant                         Men may shave face and neck.   Do not bring valuables to the hospital. [place].  Contacts, dentures or bridgework may not be worn into surgery.  Leave suitcase in the car. After surgery it may be brought to your room.                  Please read over the following fact sheets you were given: ____________________________________________________________________           [facility] - Preparing for Surgery Before surgery, you can play an important role.  Because skin is not sterile, your skin needs to be as free of germs as possible.  You can reduce the number of germs on your skin by washing with CHG (chlorahexidine gluconate) soap before surgery.  CHG is an antiseptic cleaner which kills germs and bonds with the skin to continue killing germs even after washing. Please DO NOT use if you have an allergy to CHG or antibacterial soaps.  If your skin becomes reddened/irritated stop using the CHG and inform your nurse when you arrive at Short Stay. Do not shave (including legs and underarms) for at least 48 hours prior to the first CHG shower.  You may shave your  face/neck. Please follow these instructions carefully:  1.  Shower with CHG Soap the night before surgery and the  morning of Surgery.  2.  If you choose to wash your hair, wash your hair first as usual with your  normal  shampoo.  3.  After you shampoo, rinse your hair and body thoroughly to remove the  shampoo.                           4.  Use CHG as you would any other liquid soap.  You can apply chg directly  to the skin and wash                       Gently with a scrungie or clean washcloth.  5.  Apply the CHG Soap to your body ONLY FROM THE NECK DOWN.  Do not use on face/ open                           Wound or open sores. Avoid contact with eyes, ears mouth and genitals (private parts).                       Wash face,  Genitals (private parts) with your normal soap.             6.  Wash thoroughly, paying special attention to the area where your surgery  will be performed.  7.  Thoroughly rinse your body with warm water from the neck down.  8.  DO NOT shower/wash with your normal soap after using and rinsing off  the CHG Soap.                9.  Pat yourself dry with a clean towel.            10.  Wear clean pajamas.            11.  Place clean sheets on your bed the night of your first shower and do not  sleep with pets. Day of Surgery : Do not apply any lotions/deodorants the morning of surgery.  Please wear clean clothes to the hospital/surgery center.  FAILURE TO FOLLOW THESE INSTRUCTIONS MAY RESULT IN THE CANCELLATION OF YOUR SURGERY PATIENT SIGNATURE_________________________________  NURSE SIGNATURE__________________________________  ________________________________________________________________________

## 2017-02-01 NOTE — Progress Notes (Signed)
EKG 01-11-17 Epic  OVN 01-11-17 DR [place] CLEARANCE  CATH 01-18-17 Epic  CXR 01-17-17 Epic

## 2017-02-02 ENCOUNTER — Ambulatory Visit (INDEPENDENT_AMBULATORY_CARE_PROVIDER_SITE_OTHER): Payer: Self-pay | Admitting: Physician Assistant

## 2017-02-02 ENCOUNTER — Encounter: Payer: Self-pay | Admitting: Physician Assistant

## 2017-02-02 VITALS — BP 92/60 | HR 64 | Ht 69.0 in | Wt 175.0 lb

## 2017-02-02 DIAGNOSIS — E785 Hyperlipidemia, unspecified: Secondary | ICD-10-CM

## 2017-02-02 DIAGNOSIS — I2510 Atherosclerotic heart disease of native coronary artery without angina pectoris: Secondary | ICD-10-CM

## 2017-02-02 DIAGNOSIS — Z79899 Other long term (current) drug therapy: Secondary | ICD-10-CM

## 2017-02-02 DIAGNOSIS — I10 Essential (primary) hypertension: Secondary | ICD-10-CM

## 2017-02-02 DIAGNOSIS — Z720 Tobacco use: Secondary | ICD-10-CM

## 2017-02-02 MED ORDER — ATORVASTATIN CALCIUM 20 MG PO TABS: 20.0000 mg | ORAL_TABLET | Freq: Every day | ORAL | 3 refills | Status: DC

## 2017-02-02 MED FILL — ATORVASTATIN 20 MG TABLET: 20 | 30 days supply | Qty: 30 | Fill #0

## 2017-02-02 NOTE — Progress Notes (Signed)
Cardiology Office Note    Date:  02/04/2017   ID:  [name] [name], DOB 08-13-1955, MRN [id number]  PCP:  Scot Jun, FNP  Cardiologist:  Dr. Claiborne Billings   Chief Complaint  Patient presents with  . Follow-up    post cath. Seen for Dr. Claiborne Billings    History of Present Illness:  [name] [name] is a 61 y.o. male with PMH of thoat cancer s/p chemo, tobacco abuse, hypothyroidism, HTN, and CAD.  He was found to have nephrolithiasis with left hydronephrosis.  He underwent left percutaneous nephrostomy, but require additional surgery.  His surgery was canceled due to EKG revealing anterolateral T wave changes. Echo 12/29/2016 showed EF 55-60%, mild LVH, mild MR. Myoview obtained on 01/07/2017 showed EF 49%, medium defect of moderate severity present in the mid anteroseptal, apical anterior, apical septal and apex location consistent with his prior anteroapical MI with peri-infarct ischemia.  He eventually underwent cardiac catheterization on 01/18/2017 which showed 85% posterior atrial lesion, 40% ostial OM1 lesion, 30% ostial OM 2 lesion, 95% proximal to mid LAD lesion, 90% mid to distal LAD lesion, EF 55-60%.  LAD was felt to be subtotally occluded with significant collateralization arising from the diagonal vessel to the apical LAD with retrograde filling of the LAD distally to proximally with evidence of additional diffuse 90% distal LAD stenosis.  Given lack of chest discomfort, he was cleared for urological surgery and CAD will be managed medically for now.  As his renal stone has been fixed, delayed PCI to RCA can be considered later.  Patient presents today for cardiology office visit.  He denies ever having any chest pain.  We discussed the importance of tobacco cessation.  For some reason he is not on cholesterol medication, he does not remember ever been on cholesterol medication.  I started him on Lipitor 20 mg daily.  He is currently not on aspirin, and waiting for his urology surgery to fully  complete before starting him on 81 mg aspirin.  Otherwise he does not have any lower extremity edema, orthopnea or PND.     Past Medical History:  Diagnosis Date  . Arthritis   . Coronary artery disease   . Depression 06/12/11   Buproprion per Dr. Lamonte Sakai  . Dyspnea   . Full dentures    Fitting Per Dr. Enrique Sack  . Heart murmur   . History of kidney stones   . Hypertension   . Hypothyroidism   . 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 Protein calorie malnutrition ([place]) 05/31/11  . Renal insufficiency 2012   Secondary to Hx. of Cisplatin and Dhydrtion  . Smoking 06/12/11   1/2 pack/day  . Status post chemotherapy 10/30/10 - 11/09/10    2 doses of Q 3 week Cisplatin  . Status post radiation therapy 10/11/10 - 12/24/10   Bilateral Neck and Mucosa axis /  70 gray in 35 fractions  . Xerostomia     Past Surgical History:  Procedure Laterality Date  . APPENDECTOMY    . BIOPSY TONGUE     TonsilandBaseoftongue  . HERNIA REPAIR     umbilical with mesh  . IR NEPHROSTOMY EXCHANGE LEFT  01/10/2017  . IR NEPHROSTOMY PLACEMENT LEFT  11/15/2016  . LEFT HEART CATH AND CORONARY ANGIOGRAPHY N/A 01/18/2017   Procedure: LEFT HEART CATH AND CORONARY ANGIOGRAPHY;  Surgeon: [name], MD;  Location: [facility];  Service: Cardiovascular;  Laterality: N/A;  . PEG TUBE REMOVAL  04/13/11  .  TONSILLECTOMY     right side    Current Medications: Outpatient Medications Prior to Visit  Medication Sig Dispense Refill  . amLODipine (NORVASC) 10 MG tablet Take 1 tablet (10 mg total) daily by mouth. 30 tablet 6  . hydrALAZINE (APRESOLINE) 25 MG tablet Take 2 tablets (50 mg total) every 8 (eight) hours by mouth. 270 tablet 4  . isosorbide mononitrate (IMDUR) 30 MG 24 hr tablet Take 1 tablet (30 mg total) daily by mouth. 30 tablet 0  . levothyroxine (SYNTHROID, LEVOTHROID) 100 MCG tablet Take 1 tablet (100 mcg total) daily before breakfast by mouth. 30 tablet 6  . metoprolol tartrate (LOPRESSOR) 25  MG tablet Take 0.5 tablets (12.5 mg total) 2 (two) times daily by mouth. 60 tablet 3  . traMADol (ULTRAM) 50 MG tablet Take 1 tablet (50 mg total) every 8 (eight) hours as needed by mouth. (Patient not taking: Reported on 02/02/2017) 20 tablet 0   No facility-administered medications prior to visit.      Allergies:   Codeine   Social History   Socioeconomic History  . Marital status: Single    Spouse name: None  . Number of children: None  . Years of education: None  . Highest education level: None  Social Needs  . Financial resource strain: None  . Food insecurity - worry: None  . Food insecurity - inability: None  . Transportation needs - medical: None  . Transportation needs - non-medical: None  Occupational History  . None  Tobacco Use  . Smoking status: Current Every Day Smoker    Packs/day: 0.25    Years: 25.00    Pack years: 6.25    Types: Cigarettes  . Smokeless tobacco: Never Used  . Tobacco comment: Smoking 4-5 cigarettes daily on average  Substance and Sexual Activity  . Alcohol use: No    Alcohol/week: 0.0 oz  . Drug use: No  . Sexual activity: Not Currently  Other Topics Concern  . None  Social History Narrative  . None     Family History:  The patient's family history includes Cancer (age of onset: 70) in his sister; Cancer (age of onset: 79) in his father.   ROS:   Please see the history of present illness.    ROS All other systems reviewed and are negative.   PHYSICAL EXAM:   VS:  BP 92/60 Comment: manual  Pulse 64   Ht 5\' 9"  (1.753 m)   Wt 175 lb (79.4 kg)   BMI 25.84 kg/m    GEN: Well nourished, well developed, in no acute distress  HEENT: normal  Neck: no JVD, carotid bruits, or masses Cardiac: RRR; no murmurs, rubs, or gallops,no edema  Respiratory:  clear to auscultation bilaterally, normal work of breathing GI: soft, nontender, nondistended, + BS MS: no deformity or atrophy  Skin: warm and dry, no rash Neuro:  Alert and Oriented x  3, Strength and sensation are intact Psych: euthymic mood, full affect  Wt Readings from Last 3 Encounters:  02/03/17 173 lb (78.5 kg)  02/02/17 175 lb (79.4 kg)  01/18/17 174 lb (78.9 kg)      Studies/Labs Reviewed:   EKG:  EKG is not ordered today.    Recent Labs: 01/03/2017: TSH 3.50 02/03/2017: ALT 18; BUN 18; Creatinine, Ser 1.38; Hemoglobin 12.4; Platelets 230; Potassium 4.1; Sodium 140   Lipid Panel    Component Value Date/Time   CHOL 173 01/03/2017 1108   TRIG 139 01/03/2017 1108   HDL  28 (L) 01/03/2017 1108   CHOLHDL 6.2 (H) 01/03/2017 1108   VLDL 36 (H) 07/18/2015 1005   LDLCALC 98 07/18/2015 1005    Additional studies/ records that were reviewed today include:   Echo 12/29/2016 LV EF: 55% -   60%  Study Conclusions  - Left ventricle: The cavity size was normal. There was mild   concentric hypertrophy. Systolic function was normal. The   estimated ejection fraction was in the range of 55% to 60%. Wall   motion was normal; there were no regional wall motion   abnormalities. Left ventricular diastolic function parameters   were normal. - Aortic valve: Transvalvular velocity was within the normal range.   There was no stenosis. There was no regurgitation. - Mitral valve: Transvalvular velocity was within the normal range.   There was no evidence for stenosis. There was mild regurgitation. - Left atrium: The atrium was mildly dilated. - Right ventricle: The cavity size was normal. Wall thickness was   normal. Systolic function was normal. - Atrial septum: No defect or patent foramen ovale was identified   by color flow Doppler. - Tricuspid valve: There was no regurgitation.     Myoview 01/07/2017 Study Highlights    Nuclear stress EF: 49%. The left ventricular ejection fraction is mildly decreased (45-54%).  Defect 1: There is a medium defect of moderate severity present in the mid anteroseptal, apical anterior, apical septal and apex  location.  Findings consistent prior anterior apical myocardial infarction with peri-infarct ischemia.  This is a high risk study.      Cath 01/18/2017 Conclusion     Post Atrio lesion is 85% stenosed.  Ost 1st Mrg lesion is 40% stenosed.  1st Mrg lesion is 40% stenosed.  Ost 2nd Mrg to 2nd Mrg lesion is 30% stenosed.  Prox LAD to Mid LAD lesion is 95% stenosed.  Mid LAD to Dist LAD lesion is 90% stenosed.  The left ventricular systolic function is normal.  LV end diastolic pressure is normal.  The left ventricular ejection fraction is 55-65% by visual estimate.      ASSESSMENT:    1. CAD in native artery   2. Hyperlipidemia, unspecified hyperlipidemia type   3. Medication management   4. Hyperlipidemia LDL goal <70      PLAN:  In order of problems listed above:  1. CAD: It is important for him to stop smoking.  Recent cardiac catheterization showed significant disease in the distal RCA and long mid LAD lesion.  No stent was placed as patient was asymptomatic and require upcoming urology surgery.  He denies any chest pain since discharge.  2. Hypertension: Blood pressure remains very soft.  Continue on current medication.  On amlodipine 10 mg, Imdur 30 mg daily, metoprolol and hydralazine.  3. Hyperlipidemia: Start on Lipitor 20 mg daily.  Will need fasting lipid panel in 6-8 weeks.    Medication Adjustments/Labs and Tests Ordered: Current medicines are reviewed at length with the patient today.  Concerns regarding medicines are outlined above.  Medication changes, Labs and Tests ordered today are listed in the Patient Instructions below. Patient Instructions  Isaac Laud, [name] recommends the following:  -- start atorvastatin 20mg  once daily  -- fasting lab work in 6-8 weeks (lipid, liver)  Isaac Laud, [name] recommends that you schedule a follow-up appointment in March 2019 with Dr. Claiborne Billings     Signed, [place], [name]  02/04/2017 1:24 PM    [address] [place],  [place]  [address] Phone: [phone]; Fax: [phone]

## 2017-02-02 NOTE — Patient Instructions (Addendum)
[name] [name], [name] recommends the following:  -- start atorvastatin 20mg  once daily  -- fasting lab work in 6-8 weeks (lipid, liver)  [name] [name], [name] recommends that you schedule a follow-up appointment in March 2019 with Dr. Claiborne Billings

## 2017-02-03 ENCOUNTER — Encounter (HOSPITAL_COMMUNITY)
Admission: RE | Admit: 2017-02-03 | Discharge: 2017-02-03 | Disposition: A | Discharge status: Home / Self Care | Payer: Self-pay | Source: Ambulatory Visit | Attending: Urology | Admitting: Urology

## 2017-02-03 ENCOUNTER — Other Ambulatory Visit: Payer: Self-pay

## 2017-02-03 ENCOUNTER — Encounter (HOSPITAL_COMMUNITY): Payer: Self-pay

## 2017-02-03 DIAGNOSIS — Z01812 Encounter for preprocedural laboratory examination: Secondary | ICD-10-CM | POA: Insufficient documentation

## 2017-02-03 DIAGNOSIS — N200 Calculus of kidney: Secondary | ICD-10-CM | POA: Insufficient documentation

## 2017-02-03 HISTORY — DX: Dyspnea, unspecified: R06.00

## 2017-02-03 HISTORY — DX: Cardiac murmur, unspecified: R01.1

## 2017-02-03 HISTORY — DX: Personal history of urinary calculi: Z87.442

## 2017-02-03 HISTORY — DX: Hypothyroidism, unspecified: E03.9

## 2017-02-03 HISTORY — DX: Atherosclerotic heart disease of native coronary artery without angina pectoris: I25.10

## 2017-02-03 LAB — CBC
HCT: 39.2 % (ref 39.0–52.0)
HEMOGLOBIN: 12.4 g/dL — AB (ref 13.0–17.0)
MCH: 26.3 pg (ref 26.0–34.0)
MCHC: 31.6 g/dL (ref 30.0–36.0)
MCV: 83.1 fL (ref 78.0–100.0)
PLATELETS: 230 10*3/uL (ref 150–400)
RBC: 4.72 MIL/uL (ref 4.22–5.81)
RDW: 16.3 % — ABNORMAL HIGH (ref 11.5–15.5)
WBC: 9.1 10*3/uL (ref 4.0–10.5)

## 2017-02-03 LAB — COMPREHENSIVE METABOLIC PANEL
ALBUMIN: 3.6 g/dL (ref 3.5–5.0)
ALK PHOS: 81 U/L (ref 38–126)
ALT: 18 U/L (ref 17–63)
AST: 17 U/L (ref 15–41)
Anion gap: 7 (ref 5–15)
BUN: 18 mg/dL (ref 6–20)
CALCIUM: 8.9 mg/dL (ref 8.9–10.3)
CO2: 23 mmol/L (ref 22–32)
Chloride: 110 mmol/L (ref 101–111)
Creatinine, Ser: 1.38 mg/dL — ABNORMAL HIGH (ref 0.61–1.24)
GFR calc Af Amer: >60 mL/min (ref >60)
GFR calc non Af Amer: 53 mL/min — ABNORMAL LOW (ref >60)
GLUCOSE: 89 mg/dL (ref 65–99)
Potassium: 4.1 mmol/L (ref 3.5–5.1)
SODIUM: 140 mmol/L (ref 135–145)
Total Bilirubin: 0.6 mg/dL (ref 0.3–1.2)
Total Protein: 7.4 g/dL (ref 6.5–8.1)

## 2017-02-04 ENCOUNTER — Encounter: Payer: Self-pay | Admitting: Physician Assistant

## 2017-02-10 MED ORDER — SODIUM CHLORIDE 0.9 % IV SOLN
2.0000 g | Freq: Once | INTRAVENOUS | Status: AC
  Administered 2017-02-11: 2 g via INTRAVENOUS
  Filled 2017-02-10 (×2): qty 2000

## 2017-02-10 MED ORDER — GENTAMICIN SULFATE 40 MG/ML IJ SOLN
5.0000 mg/kg | Freq: Once | INTRAVENOUS | Status: AC
  Administered 2017-02-11: 390 mg via INTRAVENOUS
  Filled 2017-02-10: qty 9.75

## 2017-02-10 NOTE — Anesthesia Preprocedure Evaluation (Addendum)
Anesthesia Evaluation  Patient identified by MRN, date of birth, ID band Patient awake    Reviewed: Allergy & Precautions, NPO status , Patient's Chart, lab work & pertinent test results, reviewed documented beta blocker date and time   History of Anesthesia Complications Negative for: history of anesthetic complications  Airway Mallampati: II  TM Distance: >3 FB Neck ROM: Full    Dental  (+) Edentulous Upper, Edentulous Lower   Pulmonary COPD, Current Smoker,    breath sounds clear to auscultation       Cardiovascular hypertension, Pt. on medications and Pt. on home beta blockers + angina + CAD (95% LAD, 85% RCA by cath 01/18/17) and + Past MI   Rhythm:Regular Rate:Normal  Cards: "to avoid preoperative dual antiplatelet therapy plans can be made for his urologic surgery to remove his renal stone with plans for delayed PCI to his RCA postoperatively, when long-term antiplatelet therapy can be administered"  10/18 ECHO: EF 55-60%, mild MR    Neuro/Psych negative neurological ROS     GI/Hepatic negative GI ROS, Neg liver ROS,   Endo/Other  Hypothyroidism   Renal/GU Renal InsufficiencyRenal disease (creat 1.38)     Musculoskeletal   Abdominal   Peds  Hematology negative hematology ROS (+)   Anesthesia Other Findings S/p chemo and XRT for oropharyngeal cancer  Reproductive/Obstetrics                           Anesthesia Physical Anesthesia Plan  ASA: III  Anesthesia Plan: General   Post-op Pain Management:    Induction: Intravenous  PONV Risk Score and Plan: 1 and Ondansetron and Dexamethasone  Airway Management Planned: Oral ETT  Additional Equipment: Arterial line  Intra-op Plan:   Post-operative Plan: Extubation in OR  Informed Consent: I have reviewed the patients History and Physical, chart, labs and discussed the procedure including the risks, benefits and alternatives for  the proposed anesthesia with the patient or authorized representative who has indicated his/her understanding and acceptance.   Dental advisory given  Plan Discussed with: CRNA and Surgeon  Anesthesia Plan Comments: (Plan routine monitors, A line, GETA)       Anesthesia Quick Evaluation

## 2017-02-10 NOTE — H&P (Deleted)
CC: I have kidney stones.  HPI: [name] [name] is a 61 year-old male with a large left renal calculus.    60yM with a hx of oropharygeal cancer s/p chemo and radiation, CKD (b/l Cr ~1.5), and HTN who is seen after a recent ED visit where he was found to have a 2.1 L UPJ stone with obstruction. This has been present for over a year but is generally asymptomatic. No sign of infection at that time. Cr at baseline. Given the need for PCNL, a left nephrostomy tube was placed at that time. Since discharge, he has been doing well. His PCN has been draining without issue.   He has passed two kidney stones with the last stone approx 7 years ago. He denies a history of voiding or storage urinary symptoms, hematuria, UTIs, GU malignancy/trauma/surgery. He smokes 1/2 ppd x 40 years. Denies ETOH and other drug use.     ALLERGIES: None    MEDICATIONS: Levothyroxine Sodium  Metoprolol Tartrate 25 mg tablet  Amlodipine Besylate 10 mg tablet  Hydrazine Sulfate     GU PSH: None   NON-GU PSH: Appendectomy (open)    GU PMH: None     PMH Notes: Hx of Throat Cancer   NON-GU PMH: Arthritis Depression Encounter for general adult medical examination without abnormal findings, Encounter for preventive health examination Gout Hypercholesterolemia Hypertension    FAMILY HISTORY: Death - Father, Mother Diabetes - Father   SOCIAL HISTORY: Marital Status: Divorced Preferred Language: English; Ethnicity: Not Hispanic Or Latino; Race: White Current Smoking Status: Patient smokes. Smokes 1/2 pack per day.   Tobacco Use Assessment Completed: Used Tobacco in last 30 days? Does not drink anymore.  Drinks 2 caffeinated drinks per day.    REVIEW OF SYSTEMS:    GU Review Male:   Patient reports frequent urination and get up at night to urinate. Patient denies hard to postpone urination, burning/ pain with urination, leakage of urine, stream starts and stops, trouble starting your stream, have to strain to  urinate , erection problems, and penile pain.  Gastrointestinal (Upper):   Patient reports indigestion/ heartburn. Patient denies nausea and vomiting.  Gastrointestinal (Lower):   Patient denies diarrhea and constipation.  Constitutional:   Patient denies fever, night sweats, weight loss, and fatigue.  Skin:   Patient denies skin rash/ lesion and itching.  Eyes:   Patient reports double vision and blurred vision.   Ears/ Nose/ Throat:   Patient denies sore throat and sinus problems.  Hematologic/Lymphatic:   Patient denies swollen glands and easy bruising.  Cardiovascular:   Patient denies leg swelling and chest pains.  Respiratory:   Patient reports cough and shortness of breath.   Endocrine:   Patient reports excessive thirst.   Musculoskeletal:   Patient reports back pain and joint pain.   Neurological:   Patient reports headaches and dizziness.   Psychologic:   Patient reports depression. Patient denies anxiety.   VITAL SIGNS:      12/02/2016 01:34 PM  Weight 165 lb / 74.84 kg  Height 69 in / 175.26 cm  BP 115/68 mmHg  Pulse 65 /min  Temperature 97.5 F / 36.3 C  BMI 24.4 kg/m   GU PHYSICAL EXAMINATION:      Notes: Left PCN draining clear yellow urine.   MULTI-SYSTEM PHYSICAL EXAMINATION:    Constitutional: Well-nourished. No physical deformities. Normally developed. Good grooming.  Neck: Neck symmetrical, not swollen. Normal tracheal position.  Respiratory: No labored breathing, no use of accessory muscles.  Cardiovascular: Normal temperature, normal extremity pulses, no swelling, no varicosities.  Lymphatic: No enlargement of neck, axillae, groin.  Skin: No paleness, no jaundice, no cyanosis. No lesion, no ulcer, no rash.  Neurologic / Psychiatric: Oriented to time, oriented to place, oriented to person. No depression, no anxiety, no agitation.  Gastrointestinal: No mass, no tenderness, no rigidity, non obese abdomen.  Eyes: Normal conjunctivae. Normal eyelids.  Ears,  Nose, Mouth, and Throat: Left ear no scars, no lesions, no masses. Right ear no scars, no lesions, no masses. Nose no scars, no lesions, no masses. Normal hearing. Normal lips.  Musculoskeletal: Normal gait and station of head and neck.     PAST DATA REVIEWED:  Source Of History:  Patient  Lab Test Review:   BMP  Records Review:   Previous Doctor Records, Previous Hospital Records  Urine Test Review:   Urinalysis  X-Ray Review: C.T. Abdomen/Pelvis: Reviewed Films. Reviewed Report. Discussed With Patient.     PROCEDURES:          Urinalysis w/Scope Dipstick Dipstick Cont'd Micro  Color: Yellow Bilirubin: Neg WBC/hpf: 0 - 5/hpf  Appearance: Cloudy Ketones: Trace RBC/hpf: 0 - 2/hpf  Specific Gravity: 1.020 Blood: Neg Bacteria: Few (10-25/hpf)  pH: 5.5 Protein: Neg Cystals: Amorph Urates  Glucose: Neg Urobilinogen: 0.2 Casts: NS (Not Seen)    Nitrites: Positive Trichomonas: Not Present    Leukocyte Esterase: Trace Mucous: Present      Epithelial Cells: NS (Not Seen)      Yeast: NS (Not Seen)      Sperm: Not Present    The patient's nuclear medicine study showed some scarring and this prompted the need for cardiac catheterization. 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 The thought was that although he would be at a slightly increased risk for general anesthesia his feeling was that he was going to hold off on stenting so that he would not have to place him on antiplatelet therapy and increase his risk of bleeding with surgery. He said that he could be stented electively postoperatively if necessary. He was placed on an oral anticoagulant which has been held in preparation for his surgery.     ASSESSMENT/PLAN:       ICD-10 Details  1 GU:   Renal calculus - N20.0           Notes:   61 y.o. male with a 2.1cm L UPJ stone with obstruction s/p L PCN placement.     I discussed with him  today the procedure of PCNL. I discussed the procedure in detail including the risks and complications, the probability of success, the need for an overnight stay and the anticipated postoperative course. I told him my goal would be to remove all of the stone however there is a possibility of the need for a second procedure either percutaneously or ureteroscopically. We also discussed the possible need for a nephrostomy tube on discharge versus a stent. I have answered his questions and he understands and has elected to proceed. He was given a prescription for Bactrim x7d to be started 1 week prior to surgery.

## 2017-02-11 ENCOUNTER — Ambulatory Visit (HOSPITAL_COMMUNITY): Payer: Self-pay

## 2017-02-11 ENCOUNTER — Ambulatory Visit (HOSPITAL_COMMUNITY): Payer: Self-pay | Admitting: Anesthesiology

## 2017-02-11 ENCOUNTER — Encounter (HOSPITAL_COMMUNITY)
Admission: RE | Disposition: A | Discharge status: Home / Self Care | Payer: Self-pay | Source: Ambulatory Visit | Attending: Urology

## 2017-02-11 ENCOUNTER — Encounter (HOSPITAL_COMMUNITY): Payer: Self-pay | Admitting: *Deleted

## 2017-02-11 ENCOUNTER — Other Ambulatory Visit: Payer: Self-pay

## 2017-02-11 ENCOUNTER — Ambulatory Visit (HOSPITAL_COMMUNITY)
Admission: RE | Admit: 2017-02-11 | Discharge: 2017-02-12 | Disposition: A | Discharge status: Home / Self Care | Payer: Self-pay | Source: Ambulatory Visit | Attending: Urology | Admitting: Urology

## 2017-02-11 DIAGNOSIS — E039 Hypothyroidism, unspecified: Secondary | ICD-10-CM | POA: Insufficient documentation

## 2017-02-11 DIAGNOSIS — I252 Old myocardial infarction: Secondary | ICD-10-CM | POA: Insufficient documentation

## 2017-02-11 DIAGNOSIS — N202 Calculus of kidney with calculus of ureter: Secondary | ICD-10-CM | POA: Insufficient documentation

## 2017-02-11 DIAGNOSIS — I129 Hypertensive chronic kidney disease with stage 1 through stage 4 chronic kidney disease, or unspecified chronic kidney disease: Secondary | ICD-10-CM | POA: Insufficient documentation

## 2017-02-11 DIAGNOSIS — N200 Calculus of kidney: Secondary | ICD-10-CM

## 2017-02-11 DIAGNOSIS — M109 Gout, unspecified: Secondary | ICD-10-CM | POA: Insufficient documentation

## 2017-02-11 DIAGNOSIS — J449 Chronic obstructive pulmonary disease, unspecified: Secondary | ICD-10-CM | POA: Insufficient documentation

## 2017-02-11 DIAGNOSIS — E7800 Pure hypercholesterolemia, unspecified: Secondary | ICD-10-CM | POA: Insufficient documentation

## 2017-02-11 DIAGNOSIS — Z923 Personal history of irradiation: Secondary | ICD-10-CM | POA: Insufficient documentation

## 2017-02-11 DIAGNOSIS — F329 Major depressive disorder, single episode, unspecified: Secondary | ICD-10-CM | POA: Insufficient documentation

## 2017-02-11 DIAGNOSIS — Z79899 Other long term (current) drug therapy: Secondary | ICD-10-CM | POA: Insufficient documentation

## 2017-02-11 DIAGNOSIS — M1990 Unspecified osteoarthritis, unspecified site: Secondary | ICD-10-CM | POA: Insufficient documentation

## 2017-02-11 DIAGNOSIS — F17210 Nicotine dependence, cigarettes, uncomplicated: Secondary | ICD-10-CM | POA: Insufficient documentation

## 2017-02-11 DIAGNOSIS — Z9221 Personal history of antineoplastic chemotherapy: Secondary | ICD-10-CM | POA: Insufficient documentation

## 2017-02-11 HISTORY — PX: NEPHROLITHOTOMY: SHX5134

## 2017-02-11 SURGERY — NEPHROLITHOTOMY PERCUTANEOUS
Anesthesia: General | Laterality: Left

## 2017-02-11 MED ORDER — MIDAZOLAM HCL 2 MG/2ML IJ SOLN
INTRAMUSCULAR | Status: AC
  Filled 2017-02-11: qty 2

## 2017-02-11 MED ORDER — MIDAZOLAM HCL 5 MG/5ML IJ SOLN
INTRAMUSCULAR | Status: DC | PRN
  Administered 2017-02-11 (×2): 1 mg via INTRAVENOUS

## 2017-02-11 MED ORDER — DOCUSATE SODIUM 100 MG PO CAPS: 100.0000 mg | ORAL_CAPSULE | Freq: Every day | ORAL | 0 refills | Status: DC | PRN

## 2017-02-11 MED ORDER — SODIUM CHLORIDE 0.9 % IV SOLN
INTRAVENOUS | Status: DC
  Administered 2017-02-11 – 2017-02-12 (×2): via INTRAVENOUS

## 2017-02-11 MED ORDER — MEPERIDINE HCL 50 MG/ML IJ SOLN: 6.2500 mg | INTRAMUSCULAR | Status: DC | PRN

## 2017-02-11 MED ORDER — PHENYLEPHRINE 40 MCG/ML (10ML) SYRINGE FOR IV PUSH (FOR BLOOD PRESSURE SUPPORT)
PREFILLED_SYRINGE | INTRAVENOUS | Status: AC
  Filled 2017-02-11: qty 10

## 2017-02-11 MED ORDER — PHENYLEPHRINE HCL 10 MG/ML IJ SOLN
INTRAMUSCULAR | Status: AC
  Filled 2017-02-11: qty 1

## 2017-02-11 MED ORDER — LACTATED RINGERS IV SOLN
INTRAVENOUS | Status: DC | PRN
  Administered 2017-02-11 (×2): via INTRAVENOUS

## 2017-02-11 MED ORDER — IOHEXOL 300 MG/ML  SOLN
INTRAMUSCULAR | Status: DC | PRN
  Administered 2017-02-11: 5 mL

## 2017-02-11 MED ORDER — NITROGLYCERIN IN D5W 200-5 MCG/ML-% IV SOLN
0.0000 ug/min | INTRAVENOUS | Status: DC
  Administered 2017-02-11: 5 ug/min via INTRAVENOUS

## 2017-02-11 MED ORDER — HYDROMORPHONE HCL 1 MG/ML IJ SOLN
0.2500 mg | INTRAMUSCULAR | Status: DC | PRN
  Administered 2017-02-11 (×3): 0.5 mg via INTRAVENOUS

## 2017-02-11 MED ORDER — FENTANYL CITRATE (PF) 100 MCG/2ML IJ SOLN
INTRAMUSCULAR | Status: AC
  Filled 2017-02-11: qty 2

## 2017-02-11 MED ORDER — ONDANSETRON HCL 4 MG/2ML IJ SOLN
4.0000 mg | INTRAMUSCULAR | Status: DC | PRN
  Administered 2017-02-11: 4 mg via INTRAVENOUS
  Filled 2017-02-11: qty 2

## 2017-02-11 MED ORDER — HYDROMORPHONE HCL 1 MG/ML IJ SOLN
INTRAMUSCULAR | Status: AC
  Filled 2017-02-11: qty 1

## 2017-02-11 MED ORDER — SUGAMMADEX SODIUM 200 MG/2ML IV SOLN
INTRAVENOUS | Status: AC
  Filled 2017-02-11: qty 2

## 2017-02-11 MED ORDER — PROPOFOL 10 MG/ML IV BOLUS
INTRAVENOUS | Status: AC
  Filled 2017-02-11: qty 20

## 2017-02-11 MED ORDER — HYDROMORPHONE HCL 1 MG/ML IJ SOLN
0.5000 mg | INTRAMUSCULAR | Status: DC | PRN
  Administered 2017-02-11 (×2): 1 mg via INTRAVENOUS
  Filled 2017-02-11 (×2): qty 1

## 2017-02-11 MED ORDER — SUGAMMADEX SODIUM 200 MG/2ML IV SOLN
INTRAVENOUS | Status: DC | PRN
  Administered 2017-02-11: 150 mg via INTRAVENOUS

## 2017-02-11 MED ORDER — ACETAMINOPHEN 325 MG PO TABS: 650.0000 mg | ORAL_TABLET | ORAL | Status: DC | PRN

## 2017-02-11 MED ORDER — PROMETHAZINE HCL 25 MG/ML IJ SOLN
6.2500 mg | INTRAMUSCULAR | Status: DC | PRN
Start: 2017-02-11 — End: 2017-02-11

## 2017-02-11 MED ORDER — DEXAMETHASONE SODIUM PHOSPHATE 10 MG/ML IJ SOLN
INTRAMUSCULAR | Status: AC
  Filled 2017-02-11: qty 1

## 2017-02-11 MED ORDER — DEXAMETHASONE SODIUM PHOSPHATE 10 MG/ML IJ SOLN
INTRAMUSCULAR | Status: DC | PRN
  Administered 2017-02-11: 10 mg via INTRAVENOUS

## 2017-02-11 MED ORDER — PHENYLEPHRINE HCL 10 MG/ML IJ SOLN
INTRAVENOUS | Status: DC | PRN
  Administered 2017-02-11: 40 ug/min via INTRAVENOUS

## 2017-02-11 MED ORDER — EPHEDRINE SULFATE-NACL 50-0.9 MG/10ML-% IV SOSY
PREFILLED_SYRINGE | INTRAVENOUS | Status: DC | PRN
  Administered 2017-02-11: 10 mg via INTRAVENOUS
  Administered 2017-02-11: 5 mg via INTRAVENOUS
  Administered 2017-02-11: 10 mg via INTRAVENOUS

## 2017-02-11 MED ORDER — NITROGLYCERIN IN D5W 200-5 MCG/ML-% IV SOLN
INTRAVENOUS | Status: AC
  Filled 2017-02-11: qty 250

## 2017-02-11 MED ORDER — OXYCODONE-ACETAMINOPHEN 5-325 MG PO TABS
1.0000 | ORAL_TABLET | ORAL | Status: DC | PRN
  Administered 2017-02-12: 1 via ORAL
  Filled 2017-02-11 (×2): qty 1

## 2017-02-11 MED ORDER — FENTANYL CITRATE (PF) 250 MCG/5ML IJ SOLN
INTRAMUSCULAR | Status: AC
  Filled 2017-02-11: qty 5

## 2017-02-11 MED ORDER — ROCURONIUM BROMIDE 10 MG/ML (PF) SYRINGE
PREFILLED_SYRINGE | INTRAVENOUS | Status: DC | PRN
  Administered 2017-02-11: 50 mg via INTRAVENOUS

## 2017-02-11 MED ORDER — LEVOFLOXACIN IN D5W 500 MG/100ML IV SOLN
500.0000 mg | INTRAVENOUS | Status: AC
  Administered 2017-02-11: 500 mg via INTRAVENOUS
  Filled 2017-02-11: qty 100

## 2017-02-11 MED ORDER — OXYCODONE HCL 10 MG PO TABS: 10.0000 mg | ORAL_TABLET | ORAL | 0 refills | Status: DC | PRN

## 2017-02-11 MED ORDER — LIDOCAINE 2% (20 MG/ML) 5 ML SYRINGE
INTRAMUSCULAR | Status: DC | PRN
  Administered 2017-02-11: 20 mg via INTRAVENOUS

## 2017-02-11 MED ORDER — OXYBUTYNIN CHLORIDE 5 MG PO TABS
5.0000 mg | ORAL_TABLET | Freq: Three times a day (TID) | ORAL | Status: DC | PRN
  Administered 2017-02-11: 5 mg via ORAL
  Filled 2017-02-11 (×2): qty 1

## 2017-02-11 MED ORDER — ONDANSETRON HCL 4 MG/2ML IJ SOLN
INTRAMUSCULAR | Status: AC
  Filled 2017-02-11: qty 2

## 2017-02-11 MED ORDER — MIDAZOLAM HCL 2 MG/2ML IJ SOLN: 0.5000 mg | Freq: Once | INTRAMUSCULAR | Status: DC | PRN

## 2017-02-11 MED ORDER — FENTANYL CITRATE (PF) 250 MCG/5ML IJ SOLN
INTRAMUSCULAR | Status: DC | PRN
  Administered 2017-02-11: 100 ug via INTRAVENOUS
  Administered 2017-02-11: 150 ug via INTRAVENOUS
  Administered 2017-02-11 (×2): 50 ug via INTRAVENOUS

## 2017-02-11 MED ORDER — LIDOCAINE 2% (20 MG/ML) 5 ML SYRINGE
INTRAMUSCULAR | Status: AC
  Filled 2017-02-11: qty 5

## 2017-02-11 MED ORDER — SODIUM CHLORIDE 0.9 % IR SOLN
Status: DC | PRN
  Administered 2017-02-11: 12000 mL

## 2017-02-11 MED ORDER — PROPOFOL 10 MG/ML IV BOLUS
INTRAVENOUS | Status: DC | PRN
  Administered 2017-02-11: 70 mg via INTRAVENOUS

## 2017-02-11 MED ORDER — PHENYLEPHRINE 40 MCG/ML (10ML) SYRINGE FOR IV PUSH (FOR BLOOD PRESSURE SUPPORT)
PREFILLED_SYRINGE | INTRAVENOUS | Status: DC | PRN
  Administered 2017-02-11 (×3): 80 ug via INTRAVENOUS

## 2017-02-11 MED ORDER — ONDANSETRON HCL 4 MG/2ML IJ SOLN
INTRAMUSCULAR | Status: DC | PRN
  Administered 2017-02-11: 4 mg via INTRAVENOUS

## 2017-02-11 SURGICAL SUPPLY — 51 items
AGENT HMST KT MTR STRL THRMB (HEMOSTASIS)
APL ESCP 34 STRL LF DISP (HEMOSTASIS) ×1
APL SKNCLS STERI-STRIP NONHPOA (GAUZE/BANDAGES/DRESSINGS)
APPLICATOR SURGIFLO ENDO (HEMOSTASIS) ×1 IMPLANT
BAG URINE DRAINAGE (UROLOGICAL SUPPLIES) IMPLANT
BASKET ZERO TIP NITINOL 2.4FR (BASKET) IMPLANT
BENZOIN TINCTURE PRP APPL 2/3 (GAUZE/BANDAGES/DRESSINGS) ×1 IMPLANT
BLADE SURG 15 STRL LF DISP TIS (BLADE) ×1 IMPLANT
BLADE SURG 15 STRL SS (BLADE) ×2
BSKT STON RTRVL ZERO TP 2.4FR (BASKET)
CATCHER STONE W/TUBE ADAPTER (UROLOGICAL SUPPLIES) IMPLANT
CATH FOLEY 2W COUNCIL 20FR 5CC (CATHETERS) IMPLANT
CATH FOLEY 2WAY SLVR  5CC 18FR (CATHETERS)
CATH FOLEY 2WAY SLVR 5CC 18FR (CATHETERS) IMPLANT
CATH IMAGER II 65CM (CATHETERS) IMPLANT
CATH UROLOGY TORQUE 40 (MISCELLANEOUS) ×1 IMPLANT
CATH X-FORCE N30 NEPHROSTOMY (TUBING) ×2 IMPLANT
COVER SURGICAL LIGHT HANDLE (MISCELLANEOUS) ×2 IMPLANT
DRAPE C-ARM 42X120 X-RAY (DRAPES) ×2 IMPLANT
DRAPE LINGEMAN PERC (DRAPES) ×2 IMPLANT
DRAPE SURG IRRIG POUCH 19X23 (DRAPES) ×2 IMPLANT
DRSG PAD ABDOMINAL 8X10 ST (GAUZE/BANDAGES/DRESSINGS) ×2 IMPLANT
DRSG TEGADERM 4X4.75 (GAUZE/BANDAGES/DRESSINGS) ×1 IMPLANT
DRSG TEGADERM 8X12 (GAUZE/BANDAGES/DRESSINGS) IMPLANT
FIBER LASER TRAC TIP (UROLOGICAL SUPPLIES) IMPLANT
FLOSEAL 10ML (HEMOSTASIS) ×1 IMPLANT
GAUZE SPONGE 4X4 12PLY STRL (GAUZE/BANDAGES/DRESSINGS) ×2 IMPLANT
GLOVE BIOGEL M 8.0 STRL (GLOVE) ×2 IMPLANT
GOWN STRL REUS W/TWL XL LVL3 (GOWN DISPOSABLE) ×2 IMPLANT
GUIDEWIRE ANG ZIPWIRE 038X150 (WIRE) ×1 IMPLANT
GUIDEWIRE STR DUAL SENSOR (WIRE) ×2 IMPLANT
HOLDER FOLEY CATH W/STRAP (MISCELLANEOUS) ×2 IMPLANT
KIT BASIN OR (CUSTOM PROCEDURE TRAY) ×2 IMPLANT
MANIFOLD NEPTUNE II (INSTRUMENTS) ×2 IMPLANT
NS IRRIG 1000ML POUR BTL (IV SOLUTION) ×1 IMPLANT
PACK CYSTO (CUSTOM PROCEDURE TRAY) ×2 IMPLANT
PROBE LITHOCLAST ULTRA 3.8X403 (UROLOGICAL SUPPLIES) IMPLANT
PROBE PNEUMATIC 1.0MMX570MM (UROLOGICAL SUPPLIES) IMPLANT
SET IRRIG Y TYPE TUR BLADDER L (SET/KITS/TRAYS/PACK) ×1 IMPLANT
SHEATH PEELAWAY SET 9 (SHEATH) ×2 IMPLANT
STENT URET 6FRX24 CONTOUR (STENTS) ×1 IMPLANT
STONE CATCHER W/TUBE ADAPTER (UROLOGICAL SUPPLIES) IMPLANT
SURGIFLO W/THROMBIN 8M KIT (HEMOSTASIS) IMPLANT
SUT MNCRL AB 4-0 PS2 18 (SUTURE) IMPLANT
SUT SILK 2 0 30  PSL (SUTURE)
SUT SILK 2 0 30 PSL (SUTURE) IMPLANT
SYR 10ML LL (SYRINGE) ×2 IMPLANT
SYR 20CC LL (SYRINGE) ×4 IMPLANT
TOWEL OR NON WOVEN STRL DISP B (DISPOSABLE) ×2 IMPLANT
TRAY FOLEY W/METER SILVER 16FR (SET/KITS/TRAYS/PACK) ×1 IMPLANT
TUBING CONNECTING 10 (TUBING) ×4 IMPLANT

## 2017-02-11 NOTE — Progress Notes (Signed)
Post-op note  Subjective: The patient is doing well.  No complaints. Urine light pink  CT with no residual L sided stone   Objective: Vital signs in last 24 hours: Temp:  [97.3 F (36.3 C)-98 F (36.7 C)] 97.6 F (36.4 C) (12/14 1404) Pulse Rate:  [59-71] 64 (12/14 1404) Resp:  [13-19] 14 (12/14 1404) BP: (103-139)/(65-76) 104/66 (12/14 1404) SpO2:  [94 %-100 %] 95 % (12/14 1404) Arterial Line BP: (119-147)/(51-62) 122/51 (12/14 1015) Weight:  [78.5 kg (173 lb)] 78.5 kg (173 lb) (12/14 0551)  Intake/Output from previous day: No intake/output data recorded. Intake/Output this shift: Total I/O In: 1564.4 [I.V.:1464.4; IV Piggyback:100] Out: 500 [Urine:475; Blood:25]  Physical Exam:  General: Alert and oriented. Abdomen: Soft, Nondistended Incisions: Clean and dry. GU: foley catheter to drainage, draining light pink  Lab Results: No results for input(s): HGB, HCT in the last 72 hours.  Assessment/Plan: POD#0   1) Continue to monitor. 2.) catheter out in Am 3) Home tomorrrow 4) Return in 1-2 weeks for stent pull and follow up    Alla Feeling, MD, MD   LOS: 0 days   Alla Feeling, MD 02/11/2017, 3:37 PM

## 2017-02-11 NOTE — Op Note (Signed)
PATIENT:  [name] [name]  PRE-OPERATIVE DIAGNOSIS:   POST-OPERATIVE DIAGNOSIS: Same  PROCEDURE: 1. Percutaneous nephrostomy sheath placement. 2.  Left percutaneous nephrolithotomy (> 2cm.) 3. Antegrade double-J stent placement, 76fr x 24cm JJ 4.  Antegrade nephrostogram with interpretation.   5. Fluoroscopy time  1 hr.  SURGEON:  Claybon Jabs  INDICATION: PETROS AHART is a 61 y/o M w/ hx of 2cm Left renal stone burden. Left PCNL was placed at a prior admission by VIR. He presents today for L PCNL. Risks benefits and alternatives were discussed in detail   ANESTHESIA:  General  EBL:  Minimal  DRAINS:   LOCAL MEDICATIONS USED:  None  SPECIMEN:    Description of procedure: After informed consent the patient was taken to the operating room and administered general endotracheal anesthesia. Once fully anesthetized the patient had an 73 French Foley catheter placed It was then moved from the stretcher onto the operating room table in a prone position with bony prominences padded and chest pads in place. The flank with exiting nephrostomy catheter was then sterilely prepped and draped in standard fashion. An official timeout was then performed.  Through the existing nephrostomy catheter a antegrade nephrostogram was performed to delineate the collecting system and renal pelvis as well as the location of the stone in the pelvis.   There were no filling defects or other abnormality noted other than the single stone.  Using the existing nephrostomy catheter access I passed a 0.038 inch floppy tipped guidewire down the ureter into the bladder under fluoroscopy. This was left in place and the nephrostomy catheter was removed.  A transverse incision was made over the guidewire and a peel-away coaxial catheter was then passed over the guidewire and down the ureter under fluoroscopy.  The inner portion of the coaxial system was then removed and a second guidewire was passed through this catheter and  down the ureter into the bladder under fluoroscopy.  The coaxial catheter was then removed and one of the guidewires was secured to the drape as a safety guidewire and the second guidewire was used as a working guidewire.  The NephroMax nephrostomy dilating balloon was then passed over the working guidewire into the area of the renal pelvis under fluoroscopy.  It was then inflated using dilute contrast under fluoroscopy until the balloon was fully inflated.  I then passed the 28 French nephrostomy access sheath over the balloon into the area of the renal pelvis under fluoroscopy and then deflated the balloon and removed the dilating balloon.  The 30 French rigid nephroscope was then passed under direct vision through the nephrostomy access sheath.  Clotted blood was evacuated and the stone was identified.  Then using the lithotrite, the stone was fractured and retrieved using a 2 prong basket. Once the stone was completely treated, we performed flexible nephroscopy and confirmed no residual stone.   Then, using the rigid nephroscope, we placed a 73fr x 24cm JJ ureteral stent antegrade with a curl noted in the bladder, fluoroscopically, and curl noted in the kidney visually.   Then, the remaining guidewires were removed. The tract was then [facility] and we had excellent hemostasis. The wound was then closed with a 4-0 monocryl in subcuticular fashion.   The patient tolerated the procedure well and was recovered in PACU without difficulty   PLAN OF CARE: Discharge to home after an overnight stay.  PATIENT DISPOSITION:  PACU - hemodynamically stable.    I, Hazeline Junker, was present and  participated in all aspects of the entire procedure.

## 2017-02-11 NOTE — Discharge Instructions (Signed)

## 2017-02-11 NOTE — Anesthesia Procedure Notes (Signed)
Procedure Name: Intubation Date/Time: 02/11/2017 7:48 AM Performed by: Talbot Grumbling, CRNA Pre-anesthesia Checklist: Patient identified, Emergency Drugs available, Suction available and Patient being monitored Patient Re-evaluated:Patient Re-evaluated prior to induction Oxygen Delivery Method: Circle system utilized Preoxygenation: Pre-oxygenation with 100% oxygen Induction Type: IV induction Ventilation: Mask ventilation without difficulty Laryngoscope Size: Mac and 3 Grade View: Grade I Tube size: 7.0 mm Number of attempts: 1 Airway Equipment and Method: Stylet Placement Confirmation: ETT inserted through vocal cords under direct vision,  positive ETCO2 and breath sounds checked- equal and bilateral Secured at: 22 cm Tube secured with: Tape Dental Injury: Teeth and Oropharynx as per pre-operative assessment

## 2017-02-11 NOTE — Anesthesia Procedure Notes (Signed)
Arterial Line Insertion Start/End12/14/2018 7:00 AM, 02/11/2017 7:10 AM Performed by: Annye Asa, MD, Talbot Grumbling, CRNA, CRNA  Patient location: Pre-op. Preanesthetic checklist: patient identified, IV checked, site marked, risks and benefits discussed, surgical consent, monitors and equipment checked, pre-op evaluation, timeout performed and anesthesia consent Lidocaine 1% used for infiltration and patient sedated Left, radial was placed Catheter size: 20 G Hand hygiene performed   Attempts: 1 Procedure performed without using ultrasound guided technique. Following insertion, dressing applied and Biopatch. Post procedure assessment: normal and unchanged  Patient tolerated the procedure well with no immediate complications.

## 2017-02-11 NOTE — H&P (Signed)
HPI: [name] [name] is a 61 year-old male with a large left renal calculus.    60yM with a hx of oropharygeal cancer s/p chemo and radiation, CKD (b/l Cr ~1.5), and HTN who is seen after a recent ED visit where he was found to have a 2.1 L UPJ stone with obstruction. This has been present for over a year but is generally asymptomatic. No sign of infection at that time. Cr at baseline. Given the need for PCNL, a left nephrostomy tube was placed at that time. Since discharge, he has been doing well. His PCN has been draining without issue.   He has passed two kidney stones with the last stone approx 7 years ago. He denies a history of voiding or storage urinary symptoms, hematuria, UTIs, GU malignancy/trauma/surgery. He smokes 1/2 ppd x 40 years. Denies ETOH and other drug use.     ALLERGIES: None    MEDICATIONS: Levothyroxine Sodium  Metoprolol Tartrate 25 mg tablet  Amlodipine Besylate 10 mg tablet  Hydrazine Sulfate     GU PSH: None   NON-GU PSH: Appendectomy (open)    GU PMH: None     PMH Notes: Hx of Throat Cancer   NON-GU PMH: Arthritis Depression Encounter for general adult medical examination without abnormal findings, Encounter for preventive health examination Gout Hypercholesterolemia Hypertension    FAMILY HISTORY: Death - Father, Mother Diabetes - Father   SOCIAL HISTORY: Marital Status: Divorced Preferred Language: English; Ethnicity: Not Hispanic Or Latino; Race: White Current Smoking Status: Patient smokes. Smokes 1/2 pack per day.   Tobacco Use Assessment Completed: Used Tobacco in last 30 days? Does not drink anymore.  Drinks 2 caffeinated drinks per day.    REVIEW OF SYSTEMS:    GU Review Male:   Patient reports frequent urination and get up at night to urinate. Patient denies hard to postpone urination, burning/ pain with urination, leakage of urine, stream starts and stops, trouble starting your stream, have to strain to urinate ,  erection problems, and penile pain.  Gastrointestinal (Upper):   Patient reports indigestion/ heartburn. Patient denies nausea and vomiting.  Gastrointestinal (Lower):   Patient denies diarrhea and constipation.  Constitutional:   Patient denies fever, night sweats, weight loss, and fatigue.  Skin:   Patient denies skin rash/ lesion and itching.  Eyes:   Patient reports double vision and blurred vision.   Ears/ Nose/ Throat:   Patient denies sore throat and sinus problems.  Hematologic/Lymphatic:   Patient denies swollen glands and easy bruising.  Cardiovascular:   Patient denies leg swelling and chest pains.  Respiratory:   Patient reports cough and shortness of breath.   Endocrine:   Patient reports excessive thirst.   Musculoskeletal:   Patient reports back pain and joint pain.   Neurological:   Patient reports headaches and dizziness.   Psychologic:   Patient reports depression. Patient denies anxiety.   VITAL SIGNS:      12/02/2016 01:34 PM  Weight 165 lb / 74.84 kg  Height 69 in / 175.26 cm  BP 115/68 mmHg  Pulse 65 /min  Temperature 97.5 F / 36.3 C  BMI 24.4 kg/m   GU PHYSICAL EXAMINATION:      Notes: Left PCN draining clear yellow urine.   MULTI-SYSTEM PHYSICAL EXAMINATION:    Constitutional: Well-nourished. No physical deformities. Normally developed. Good grooming.  Neck: Neck symmetrical, not swollen. Normal tracheal position.  Respiratory: No labored breathing, no use of accessory muscles.   Cardiovascular: Normal temperature, normal extremity  pulses, no swelling, no varicosities.  Lymphatic: No enlargement of neck, axillae, groin.  Skin: No paleness, no jaundice, no cyanosis. No lesion, no ulcer, no rash.  Neurologic / Psychiatric: Oriented to time, oriented to place, oriented to person. No depression, no anxiety, no agitation.  Gastrointestinal: No mass, no tenderness, no rigidity, non obese abdomen.  Eyes: Normal conjunctivae. Normal eyelids.  Ears, Nose,  Mouth, and Throat: Left ear no scars, no lesions, no masses. Right ear no scars, no lesions, no masses. Nose no scars, no lesions, no masses. Normal hearing. Normal lips.  Musculoskeletal: Normal gait and station of head and neck.     PAST DATA REVIEWED:  Source Of History:  Patient  Lab Test Review:   BMP  Records Review:   Previous Doctor Records, Previous Hospital Records  Urine Test Review:   Urinalysis  X-Ray Review: C.T. Abdomen/Pelvis: Reviewed Films. Reviewed Report. Discussed With Patient.     PROCEDURES:          Urinalysis w/Scope Dipstick Dipstick Cont'd Micro  Color: Yellow Bilirubin: Neg WBC/hpf: 0 - 5/hpf  Appearance: Cloudy Ketones: Trace RBC/hpf: 0 - 2/hpf  Specific Gravity: 1.020 Blood: Neg Bacteria: Few (10-25/hpf)  pH: 5.5 Protein: Neg Cystals: Amorph Urates  Glucose: Neg Urobilinogen: 0.2 Casts: NS (Not Seen)    Nitrites: Positive Trichomonas: Not Present    Leukocyte Esterase: Trace Mucous: Present      Epithelial Cells: NS (Not Seen)      Yeast: NS (Not Seen)      Sperm: Not Present    The patient's nuclear medicine study showed some scarring and this prompted the need for cardiac catheterization. 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 The thought was that although he would be at a slightly increased risk for general anesthesia his feeling was that he was going to hold off on stenting so that he would not have to place him on antiplatelet therapy and increase his risk of bleeding with surgery. He said that he could be stented electively postoperatively if necessary. He was placed on an oral anticoagulant which has been held in preparation for his surgery.     ASSESSMENT/PLAN:       ICD-10 Details  1 GU:   Renal calculus - N20.0           Notes:   61 y.o. male with a 2.1cm L UPJ stone with obstruction s/p L PCN placement.     I discussed with him  today the procedure of PCNL. I discussed the procedure in detail including the risks and complications, the probability of success, the need for an overnight stay and the anticipated postoperative course. I told him my goal would be to remove all of the stone however there is a possibility of the need for a second procedure either percutaneously or ureteroscopically. We also discussed the possible need for a nephrostomy tube on discharge versus a stent. I have answered his questions and he understands and has elected to proceed. He was given a prescription for Bactrim x7d to be started 1 week prior to surgery.

## 2017-02-11 NOTE — Transfer of Care (Signed)
Immediate Anesthesia Transfer of Care Note  Patient: [name] [name]  Procedure(s) Performed: NEPHROLITHOTOMY PERCUTANEOUS (Left )  Patient Location: PACU  Anesthesia Type:General  Level of Consciousness: awake, alert  and oriented  Airway & Oxygen Therapy: Patient Spontanous Breathing and Patient connected to face mask oxygen  Post-op Assessment: Report given to RN and Post -op Vital signs reviewed and stable  Post vital signs: Reviewed and stable  Last Vitals:  Vitals:   02/11/17 0535  BP: 129/67  Pulse: 71  Resp: 16  Temp: 36.7 C  SpO2: 100%    Last Pain:  Vitals:   02/11/17 0535  TempSrc: Oral      Patients Stated Pain Goal: 4 (45/80/99 8338)  Complications: No apparent anesthesia complications

## 2017-02-11 NOTE — Anesthesia Postprocedure Evaluation (Signed)
Anesthesia Post Note  Patient: [name] [name]  Procedure(s) Performed: NEPHROLITHOTOMY PERCUTANEOUS (Left )     Patient location during evaluation: PACU Anesthesia Type: General Level of consciousness: awake and alert, oriented and patient cooperative Pain management: pain level controlled Vital Signs Assessment: post-procedure vital signs reviewed and stable Respiratory status: spontaneous breathing, nonlabored ventilation, respiratory function stable and patient connected to nasal cannula oxygen Cardiovascular status: blood pressure returned to baseline and stable Postop Assessment: no apparent nausea or vomiting Anesthetic complications: no    Last Vitals:  Vitals:   02/11/17 1030 02/11/17 1055  BP: 103/65 113/67  Pulse: 63 61  Resp: 13 14  Temp: (!) 36.3 C (!) 36.3 C  SpO2: 95% 94%    Last Pain:  Vitals:   02/11/17 1030  TempSrc:   PainSc: Asleep                 Tammatha Cobb,E. Anes Rigel

## 2017-02-12 ENCOUNTER — Encounter (HOSPITAL_COMMUNITY): Payer: Self-pay | Admitting: Urology

## 2017-02-12 NOTE — Progress Notes (Signed)
Pt given discharge teaching/instructions including medications and schedules. Pt verbalized understanding of all discharge teaching and instructions. Discharge packet with prescriptions with Pt at time of discharge.

## 2017-02-12 NOTE — Discharge Summary (Signed)
Physician Discharge Summary  Patient ID: [name] [name] MRN: [id number] DOB/AGE: December 01, 1955 61 y.o.  Admit date: 02/11/2017 Discharge date: 02/12/2017  Admission Diagnoses:  Discharge Diagnoses:  Active Problems:   Renal calculi   Discharged Condition: good  Hospital Course: underwent L PCNL. Following day ambulating, doing well minimal complaints  Consults: none  Significant Diagnostic Studies: none  Treatments: surgery: L PCNL  Discharge Exam: Blood pressure 131/76, pulse 74, temperature 98.2 F (36.8 C), temperature source Oral, resp. rate 16, height 5\' 9"  (1.753 m), weight 78.5 kg (173 lb), SpO2 95 %. General appearance: cooperative, NAD NAD Adequate perfusion  NLR abd soft NTND, back dressing dry Foley light pink output  Disposition: 01-Home or Self Care   Allergies as of 02/12/2017      Reactions   Codeine Nausea Only      Medication List    TAKE these medications   amLODipine 10 MG tablet Commonly known as:  NORVASC Take 1 tablet (10 mg total) daily by mouth.   atorvastatin 20 MG tablet Commonly known as:  LIPITOR Take 1 tablet (20 mg total) by mouth daily.   docusate sodium 100 MG capsule Commonly known as:  COLACE Take 1 capsule (100 mg total) by mouth daily as needed.   hydrALAZINE 25 MG tablet Commonly known as:  APRESOLINE Take 2 tablets (50 mg total) every 8 (eight) hours by mouth.   isosorbide mononitrate 30 MG 24 hr tablet Commonly known as:  IMDUR Take 1 tablet (30 mg total) daily by mouth.   levothyroxine 100 MCG tablet Commonly known as:  SYNTHROID, LEVOTHROID Take 1 tablet (100 mcg total) daily before breakfast by mouth.   metoprolol tartrate 25 MG tablet Commonly known as:  LOPRESSOR Take 0.5 tablets (12.5 mg total) 2 (two) times daily by mouth.   Oxycodone HCl 10 MG Tabs Take 1 tablet (10 mg total) by mouth every 4 (four) hours as needed.   traMADol 50 MG tablet Commonly known as:  ULTRAM Take 1 tablet (50 mg total)  every 8 (eight) hours as needed by mouth.        Signed: Marton Redwood, III 02/12/2017, 9:18 AM

## 2017-03-09 ENCOUNTER — Encounter: Payer: Self-pay | Admitting: Family Medicine

## 2017-03-09 ENCOUNTER — Ambulatory Visit (INDEPENDENT_AMBULATORY_CARE_PROVIDER_SITE_OTHER): Payer: Self-pay | Admitting: Family Medicine

## 2017-03-09 VITALS — BP 122/66 | HR 68 | Temp 97.9°F | Resp 18 | Ht 69.0 in | Wt 175.0 lb

## 2017-03-09 DIAGNOSIS — E7849 Other hyperlipidemia: Secondary | ICD-10-CM

## 2017-03-09 DIAGNOSIS — IMO0002 Reserved for concepts with insufficient information to code with codable children: Secondary | ICD-10-CM

## 2017-03-09 DIAGNOSIS — I2510 Atherosclerotic heart disease of native coronary artery without angina pectoris: Secondary | ICD-10-CM

## 2017-03-09 DIAGNOSIS — Z131 Encounter for screening for diabetes mellitus: Secondary | ICD-10-CM

## 2017-03-09 DIAGNOSIS — R7989 Other specified abnormal findings of blood chemistry: Secondary | ICD-10-CM

## 2017-03-09 DIAGNOSIS — Z936 Other artificial openings of urinary tract status: Secondary | ICD-10-CM

## 2017-03-09 LAB — POCT URINALYSIS DIP (DEVICE)
BILIRUBIN URINE: NEGATIVE
Glucose, UA: NEGATIVE mg/dL
Ketones, ur: NEGATIVE mg/dL
NITRITE: NEGATIVE
PROTEIN: 100 mg/dL — AB
Specific Gravity, Urine: 1.025 (ref 1.005–1.030)
UROBILINOGEN UA: 0.2 mg/dL (ref 0.0–1.0)
pH: 5.5 (ref 5.0–8.0)

## 2017-03-09 MED ORDER — ATORVASTATIN CALCIUM 20 MG PO TABS: 20.0000 mg | ORAL_TABLET | Freq: Every day | ORAL | 0 refills | Status: DC

## 2017-03-09 MED ORDER — ISOSORBIDE MONONITRATE ER 30 MG PO TB24: 30.0000 mg | ORAL_TABLET | Freq: Every day | ORAL | 1 refills | Status: DC

## 2017-03-09 MED ORDER — METOPROLOL TARTRATE 25 MG PO TABS: 12.5000 mg | ORAL_TABLET | Freq: Two times a day (BID) | ORAL | 3 refills | Status: DC

## 2017-03-09 MED FILL — ?ISOSORBIDE MN 30 MG TAB SA: 30 | 30 days supply | Qty: 30 | Fill #0

## 2017-03-09 MED FILL — ?ATORVASTATIN 20 MG TABLET: 20 | 30 days supply | Qty: 30 | Fill #0

## 2017-03-09 MED FILL — ?METOPROLOL 25 MG TABLET: 25 | 30 days supply | Qty: 30 | Fill #0

## 2017-03-09 NOTE — Patient Instructions (Signed)
Steps to Quit Smoking Smoking tobacco can be bad for your health. It can also affect almost every organ in your body. Smoking puts you and people around you at risk for many serious long-lasting (chronic) diseases. Quitting smoking is hard, but it is one of the best things that you can do for your health. It is never too late to quit. What are the benefits of quitting smoking? When you quit smoking, you lower your risk for getting serious diseases and conditions. They can include:  Lung cancer or lung disease.  Heart disease.  Stroke.  Heart attack.  Not being able to have children (infertility).  Weak bones (osteoporosis) and broken bones (fractures).  If you have coughing, wheezing, and shortness of breath, those symptoms may get better when you quit. You may also get sick less often. If you are pregnant, quitting smoking can help to lower your chances of having a baby of low birth weight. What can I do to help me quit smoking? Talk with your doctor about what can help you quit smoking. Some things you can do (strategies) include:  Quitting smoking totally, instead of slowly cutting back how much you smoke over a period of time.  Going to in-person counseling. You are more likely to quit if you go to many counseling sessions.  Using resources and support systems, such as: ? Online chats with a counselor. ? Phone quitlines. ? Printed self-help materials. ? Support groups or group counseling. ? Text messaging programs. ? Mobile phone apps or applications.  Taking medicines. Some of these medicines may have nicotine in them. If you are pregnant or breastfeeding, do not take any medicines to quit smoking unless your doctor says it is okay. Talk with your doctor about counseling or other things that can help you.  Talk with your doctor about using more than one strategy at the same time, such as taking medicines while you are also going to in-person counseling. This can help make  quitting easier. What things can I do to make it easier to quit? Quitting smoking might feel very hard at first, but there is a lot that you can do to make it easier. Take these steps:  Talk to your family and friends. Ask them to support and encourage you.  Call phone quitlines, reach out to support groups, or work with a counselor.  Ask people who smoke to not smoke around you.  Avoid places that make you want (trigger) to smoke, such as: ? Bars. ? Parties. ? Smoke-break areas at work.  Spend time with people who do not smoke.  Lower the stress in your life. Stress can make you want to smoke. Try these things to help your stress: ? Getting regular exercise. ? Deep-breathing exercises. ? Yoga. ? Meditating. ? Doing a body scan. To do this, close your eyes, focus on one area of your body at a time from head to toe, and notice which parts of your body are tense. Try to relax the muscles in those areas.  Download or buy apps on your mobile phone or tablet that can help you stick to your quit plan. There are many free apps, such as QuitGuide from the CDC (Centers for Disease Control and Prevention). You can find more support from smokefree.gov and other websites.  This information is not intended to replace advice given to you by your health care provider. Make sure you discuss any questions you have with your health care provider. Document Released: 12/12/2008 Document   Revised: 10/14/2015 Document Reviewed: 07/02/2014 [employer]vier Interactive Patient Education  2018 [employer]  

## 2017-03-09 NOTE — Progress Notes (Signed)
Patient ID: [name] [name], male    DOB: 1955/05/18, 62 y.o.   MRN: [id number]  PCP: Scot Jun, FNP  Chief Complaint  Patient presents with  . Follow-up    chronic conditions    Subjective:  HPI [name] [name] is a 62 y.o. male s/p nephrostomy tube removal, CAD, current daily smoker, CKD-3, hypothyroidism, and hyperlipidemia presents today for chronic conditions follow-up. [name] was admitted to hospital 11/14/2016 and found to have obstructing renal stone measuring 2.2 cm nephrolithiasis with left hydronephrosis. He underwent left percutaneous nephrostomy on 9/17. Recently underwent removal of 09/62/8366 without complication. He reports no hematuria, flank pain, or urine odor. He remains under the care of urology. [name] suffers from [employer], last creatinine 1.38. He is also followed by cardiology, Dr.Thomas Claiborne Billings and presently medically managed for coronary artery disease. He is to undergo a PCI stenting to RCA in the near future-this was delayed due to planned nephrostomy tube removal. He denies any chest pain, shortness of breath, dizziness, or headaches. Social History   Socioeconomic History  . Marital status: Single    Spouse name: Not on file  . Number of children: Not on file  . Years of education: Not on file  . Highest education level: Not on file  Social Needs  . Financial resource strain: Not on file  . Food insecurity - worry: Not on file  . Food insecurity - inability: Not on file  . Transportation needs - medical: Not on file  . Transportation needs - non-medical: Not on file  Occupational History  . Not on file  Tobacco Use  . Smoking status: Current Every Day Smoker    Packs/day: 0.25    Years: 25.00    Pack years: 6.25    Types: Cigarettes  . Smokeless tobacco: Never Used  . Tobacco comment: Smoking 4-5 cigarettes daily on average  Substance and Sexual Activity  . Alcohol use: No    Alcohol/week: 0.0 oz  . Drug use: No  . Sexual activity: Not  Currently  Other Topics Concern  . Not on file  Social History Narrative  . Not on file    Family History  Problem Relation Age of Onset  . Cancer Father 31       colon  . Cancer Sister 62       leukemia   Review of Systems  Constitutional: Negative.   Respiratory: Negative.   Cardiovascular: Negative.   Gastrointestinal: Negative.   Genitourinary: Negative.   Psychiatric/Behavioral: Negative.     Patient Active Problem List   Diagnosis Date Noted  . Renal calculi 02/11/2017  . Abnormal nuclear stress test   . Nephrolithiasis 11/14/2016  . Hypothyroidism 06/23/2015  . Noncompliance with follow up and medications 06/23/2015  . Acute kidney injury superimposed on chronic kidney disease ([place]) 06/22/2015  . Left nephrolithiasis 06/22/2015  . Hydronephrosis, left 06/22/2015  . Acute diverticulitis 06/22/2015  . 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 Renal insufficiency     Allergies  Allergen Reactions  . Codeine Nausea Only    Prior to Admission medications   Medication Sig Start Date End Date Taking? Authorizing Provider  amLODipine (NORVASC) 10 MG tablet Take 1 tablet (10 mg total) daily by mouth. 01/03/17  Yes Scot Jun, FNP  atorvastatin (LIPITOR) 20 MG tablet  Take 1 tablet (20 mg total) by mouth daily. 02/02/17 05/03/17 Yes Almyra Deforest, PA  docusate sodium (COLACE) 100 MG capsule Take 1 capsule (100 mg total) by mouth daily as needed. 02/11/17 02/11/18 Yes [name], MD  hydrALAZINE (APRESOLINE) 25 MG tablet Take 2 tablets (50 mg total) every 8 (eight) hours by mouth. 01/11/17 06/10/17 Yes [name], MD  isosorbide mononitrate (IMDUR) 30 MG 24 hr tablet Take 1 tablet (30 mg total) daily by mouth. 01/18/17  Yes Cheryln Manly, NP  levothyroxine (SYNTHROID, LEVOTHROID) 100  MCG tablet Take 1 tablet (100 mcg total) daily before breakfast by mouth. 01/03/17 03/09/17 Yes Scot Jun, FNP  metoprolol tartrate (LOPRESSOR) 25 MG tablet Take 0.5 tablets (12.5 mg total) 2 (two) times daily by mouth. 01/03/17  Yes Scot Jun, FNP  Oxycodone HCl 10 MG TABS Take 1 tablet (10 mg total) by mouth every 4 (four) hours as needed. Patient not taking: Reported on 03/09/2017 02/11/17   [name], MD  traMADol (ULTRAM) 50 MG tablet Take 1 tablet (50 mg total) every 8 (eight) hours as needed by mouth. Patient not taking: Reported on 02/02/2017 01/03/17   Scot Jun, FNP    Past Medical, Surgical Family and Social History reviewed and updated.    Objective:   Today's Vitals   03/09/17 0956 03/09/17 0958  BP: 122/66   Pulse: 68   Resp: 18   Temp: 97.9 F (36.6 C)   TempSrc: Oral   SpO2: 99%   Weight: 175 lb (79.4 kg)   Height: 5\' 9"  (1.753 m)   PainSc:  1     Wt Readings from Last 3 Encounters:  03/09/17 175 lb (79.4 kg)  02/11/17 173 lb (78.5 kg)  02/03/17 173 lb (78.5 kg)    Physical Exam  Constitutional: He is oriented to person, place, and time. He appears well-developed and well-nourished.  HENT:  Head: Normocephalic and atraumatic.  Right Ear: External ear normal.  Left Ear: External ear normal.  Nose: Nose normal.  Mouth/Throat: Oropharynx is clear and moist.  Eyes: Conjunctivae are normal. Pupils are equal, round, and reactive to light.  Neck: Normal range of motion. Neck supple. No thyromegaly present.  Cardiovascular: Normal heart sounds and intact distal pulses.  Pulmonary/Chest: Effort normal and breath sounds normal.  Abdominal: Soft. Bowel sounds are normal. He exhibits no distension and no mass. There is no tenderness. There is no rebound and no guarding.  Musculoskeletal: Normal range of motion.  Neurological: He is alert and oriented to person, place, and time.  Psychiatric: He has a normal mood and affect. His behavior is  normal. Judgment and thought content normal.   Assessment & Plan:  1. Coronary artery disease without angina pectoris, unspecified vessel or lesion type, unspecified whether native or transplanted heart -Managed by cardiology, Dr. Shelva Majestic- plans to undergo PCI of the RCA at his discretion. -Will check CMP today  2. Other hyperlipidemia, fasting today, will check lipid panel  3. Screening for diabetes mellitus, A1C 5.3, recheck in 12 months    4. S/p nephrostomy tube removal-UA positive for hematuria.  Patient is followed by urology. Will defer to urology for further evaluation.  5. Elevated Creatinine, check renal function.    Meds ordered this encounter  Medications  . isosorbide mononitrate (IMDUR) 30 MG 24 hr tablet    Sig: Take 1 tablet (30 mg total) by mouth daily.    Dispense:  90 tablet    Refill:  1    Order Specific Question:   Supervising Provider    Answer:   Tresa Garter [id number]  . metoprolol tartrate (LOPRESSOR) 25 MG tablet    Sig: Take 0.5 tablets (12.5 mg total) by mouth 2 (two) times daily.    Dispense:  60 tablet    Refill:  3    Patient will pick up when needed    Order Specific Question:   Supervising Provider    Answer:   Tresa Garter [[id number]]  . DISCONTD: atorvastatin (LIPITOR) 20 MG tablet    Sig: Take 1 tablet (20 mg total) by mouth daily.    Dispense:  90 tablet    Refill:  0    Order Specific Question:   Supervising Provider    Answer:   Tresa Garter [id number]     Orders Placed This Encounter  Procedures  . Hemoglobin A1c  . Comprehensive metabolic panel  . Lipid panel  . POCT urinalysis dip (device)    RTC: 3 months for chronic conditions follow-up  Carroll Sage. [name], MSN, FNP-C [facility]  [address] [place], [place] [address] [phone]

## 2017-03-10 LAB — LIPID PANEL
Chol/HDL Ratio: 5.1 ratio — ABNORMAL HIGH (ref 0.0–5.0)
Cholesterol, Total: 128 mg/dL (ref 100–199)
HDL: 25 mg/dL — AB (ref >39)
LDL CALC: 79 mg/dL (ref 0–99)
Triglycerides: 122 mg/dL (ref 0–149)
VLDL CHOLESTEROL CAL: 24 mg/dL (ref 5–40)

## 2017-03-10 LAB — COMPREHENSIVE METABOLIC PANEL
A/G RATIO: 1.7 (ref 1.2–2.2)
ALT: 13 IU/L (ref 0–44)
AST: 11 IU/L (ref 0–40)
Albumin: 4.2 g/dL (ref 3.6–4.8)
Alkaline Phosphatase: 103 IU/L (ref 39–117)
BUN/Creatinine Ratio: 10 (ref 10–24)
BUN: 15 mg/dL (ref 8–27)
Bilirubin Total: 0.3 mg/dL (ref 0.0–1.2)
CALCIUM: 9.4 mg/dL (ref 8.6–10.2)
CHLORIDE: 108 mmol/L — AB (ref 96–106)
CO2: 19 mmol/L — ABNORMAL LOW (ref 20–29)
Creatinine, Ser: 1.52 mg/dL — ABNORMAL HIGH (ref 0.76–1.27)
GFR calc Af Amer: 56 mL/min/{1.73_m2} — ABNORMAL LOW (ref >59)
GFR, EST NON AFRICAN AMERICAN: 48 mL/min/{1.73_m2} — AB (ref >59)
GLUCOSE: 100 mg/dL — AB (ref 65–99)
Globulin, Total: 2.5 g/dL (ref 1.5–4.5)
POTASSIUM: 3.9 mmol/L (ref 3.5–5.2)
Sodium: 143 mmol/L (ref 134–144)
Total Protein: 6.7 g/dL (ref 6.0–8.5)

## 2017-03-10 LAB — HEMOGLOBIN A1C
Est. average glucose Bld gHb Est-mCnc: 105 mg/dL
HEMOGLOBIN A1C: 5.3 % (ref 4.8–5.6)

## 2017-03-14 ENCOUNTER — Telehealth: Payer: Self-pay | Admitting: Family Medicine

## 2017-03-14 DIAGNOSIS — I2510 Atherosclerotic heart disease of native coronary artery without angina pectoris: Secondary | ICD-10-CM

## 2017-03-14 MED ORDER — ATORVASTATIN CALCIUM 20 MG PO TABS: 40.0000 mg | ORAL_TABLET | Freq: Every day | ORAL | 0 refills | Status: DC

## 2017-03-14 NOTE — Telephone Encounter (Signed)
Contact patient to advise good cholesterol remains low, I am increasing Atorvastatin 40 mg daily.  Renal function remains impaired. I would like to have him evaluated by nephrologist give recent renal stones. Placing a referral to [place]

## 2017-03-15 NOTE — Telephone Encounter (Signed)
Patient notified and will pick up medication  

## 2017-03-15 NOTE — Telephone Encounter (Signed)
Left a vm for patient to callback 

## 2017-04-22 MED FILL — ?LEVOTHYROXINE 100 MCG TAB: 100 | 30 days supply | Qty: 30 | Fill #1

## 2017-04-22 MED FILL — ATORVASTATIN 20 MG TABLET: 20 | 30 days supply | Qty: 30 | Fill #1

## 2017-04-22 MED FILL — hydrALAZINE HCL 25 MG TABS: 25 | 30 days supply | Qty: 270 | Fill #1

## 2017-04-22 MED FILL — ISOSORBIDE MN ER 30 MG TAB: 30 | 30 days supply | Qty: 30 | Fill #1

## 2017-04-22 MED FILL — AMLODIPINE BESYLATE 10 MG T: 10 | 30 days supply | Qty: 30 | Fill #1

## 2017-04-22 MED FILL — ?METOPROLOL 25 MG TABLET: 25 | 30 days supply | Qty: 30 | Fill #1

## 2017-05-09 ENCOUNTER — Ambulatory Visit (INDEPENDENT_AMBULATORY_CARE_PROVIDER_SITE_OTHER): Payer: Self-pay | Admitting: Cardiovascular Disease

## 2017-05-09 ENCOUNTER — Encounter: Payer: Self-pay | Admitting: Cardiovascular Disease

## 2017-05-09 VITALS — BP 117/69 | HR 86 | Ht 69.0 in | Wt 186.0 lb

## 2017-05-09 DIAGNOSIS — E785 Hyperlipidemia, unspecified: Secondary | ICD-10-CM

## 2017-05-09 DIAGNOSIS — Z79899 Other long term (current) drug therapy: Secondary | ICD-10-CM

## 2017-05-09 DIAGNOSIS — I25119 Atherosclerotic heart disease of native coronary artery with unspecified angina pectoris: Secondary | ICD-10-CM

## 2017-05-09 DIAGNOSIS — R931 Abnormal findings on diagnostic imaging of heart and coronary circulation: Secondary | ICD-10-CM

## 2017-05-09 DIAGNOSIS — Z720 Tobacco use: Secondary | ICD-10-CM

## 2017-05-09 MED ORDER — METOPROLOL TARTRATE 25 MG PO TABS: 25.0000 mg | ORAL_TABLET | Freq: Two times a day (BID) | ORAL | 3 refills | Status: DC

## 2017-05-09 MED ORDER — ATORVASTATIN CALCIUM 40 MG PO TABS: 40.0000 mg | ORAL_TABLET | Freq: Every day | ORAL | 3 refills | Status: DC

## 2017-05-09 MED FILL — METOPROLOL TARTRATE 25 MG T: 25 | 30 days supply | Qty: 60 | Fill #0

## 2017-05-09 NOTE — Progress Notes (Signed)
Cardiology Office Note    Date:  05/11/2017   ID:  CAIDON [name], DOB 1955/04/12, MRN [id number]  PCP:  Scot Jun, FNP  Cardiologist:  Shelva Majestic, MD   Chief Complaint  Patient presents with  . Follow-up   F/U ; initially referred through the courtesy of Dr. Karsten Ro for preoperative clearance following demonstration of an abnormal ECG.  History of Present Illness:  [name] [name] is a 62 y.o. male who was referred for preoperative clearance by Dr. Karsten Ro prior to undergoing urologic surgery.  For initial evaluation in November 2018.  He presents for follow-up evaluation  [name] [name] has a remote history of throat cancer and underwent chemotherapy and radiation therapy.  He has a history of hypertension, as well as long-standing tobaccouse.  He started smoking at age 29 and was smoking up to 3 packs per day for many years.  Recently, he is smoking one half pack per day.  He has a history of hypothyroidism and is on levothyroxine.  He has a history of hypertension, and most recently has been on amlodipine 10 mg, hydralazine 75 mg every 8 hours, and metoprolol 12.5 mg twice a day.  He denies any recent definitive chest pain but he does admit that he is unable to walk much due to right hip discomfort.  September, he been hospitalized with left flank pain.  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  He underwent left percutaneous nephrostomy.  He is in need for additional surgery was initially scheduled to be October 22, but when he presented on October 19 for preoperative clearance his surgery was canceled after his ECG revealed anterolateral T-wave changes.    When I saw him for initial cardiology evaluation I scheduled him for 2D echo Doppler study to further evaluate his systolic murmur as well as systolic and diastolic function with his history of hypertension on a 3 drug regimen.  I also schedule him for a nuclear study to assess potential ischemic etiology to  his ECG changes.  His echo Doppler study showed an EF of 55-60%.  He had normal diastolic parameters.  There was mild mitral regurgitation.  His nuclear stress test was abnormal and interpreted as high risk demonstrating a medium defect of moderate severity in the mid anteroseptal, apical anterior, apical septal and apical location with mild peri-infarction ischemia.  EF was 49%.   On 01/18/2017.  I performed cardiac catheterization which showed preserved LV function with an EF of 55-60% with very subtle mid anterolateral and mid inferior hypocontractility.  He was found to have multivessel CAD with a long subtotal LAD stenosis after the first diagonal and septal vessels, but with significant collateralization arising from the diagonal vessel to the apical LAD leading to retrograde filling of the LAD distally approximately as well as diffuse 90% distal LAD stenosis.  There was a 40% ostial proximal OM1 stenosis, 30% on 2 stenosis and he had a large dominant RCA with focal 85% stenosis distally in the continuation branch.  After the PDA vessel.  Since Mr. [name] did not have any chest pain and his ECG showed T-wave abnormalities consistent with a subtotal LAD stenosis which was collateralized.  At that time, I gave him clearance for surgery and did not perform any intervention so as to avoid preoperative dual antiplatelet therapy.  He saw Almyra Deforest in the office in follow-up and was started on Lipitor 20 g daily. He tolerated his urologic surgery successfully without cardiovascular compromise.  Over the past several months, he has been without chest pain, PND, orthopnea.  Unfortunately he continues to smoke cigarettes but had reduced smoking from 3 packs per day to one half pack per day.  He presents for reevaluation.  Past Medical History:  Diagnosis Date  . Arthritis   . Coronary artery disease   . Depression 06/12/11   Buproprion per Dr. Lamonte Sakai  . Dyspnea   . Full dentures    Fitting Per Dr. Enrique Sack  .  Heart murmur   . History of kidney stones   . Hypertension   . Hypothyroidism   . 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 Protein calorie malnutrition ([place]) 05/31/11  . Renal insufficiency 2012   Secondary to Hx. of Cisplatin and Dhydrtion  . Smoking 06/12/11   1/2 pack/day  . Status post chemotherapy 10/30/10 - 11/09/10    2 doses of Q 3 week Cisplatin  . Status post radiation therapy 10/11/10 - 12/24/10   Bilateral Neck and Mucosa axis /  70 gray in 35 fractions  . Xerostomia     Past Surgical History:  Procedure Laterality Date  . APPENDECTOMY    . BIOPSY TONGUE     TonsilandBaseoftongue  . HERNIA REPAIR     umbilical with mesh  . IR NEPHROSTOMY EXCHANGE LEFT  01/10/2017  . IR NEPHROSTOMY PLACEMENT LEFT  11/15/2016  . LEFT HEART CATH AND CORONARY ANGIOGRAPHY N/A 01/18/2017   Procedure: LEFT HEART CATH AND CORONARY ANGIOGRAPHY;  Surgeon: [name], MD;  Location: [facility];  Service: Cardiovascular;  Laterality: N/A;  . NEPHROLITHOTOMY Left 02/11/2017   Procedure: NEPHROLITHOTOMY PERCUTANEOUS;  Surgeon: [name], MD;  Location: [facility];  Service: Urology;  Laterality: Left;  . PEG TUBE REMOVAL  04/13/11  . TONSILLECTOMY     right side    Current Medications: Outpatient Medications Prior to Visit  Medication Sig Dispense Refill  . amLODipine (NORVASC) 10 MG tablet Take 1 tablet (10 mg total) daily by mouth. 30 tablet 6  . docusate sodium (COLACE) 100 MG capsule Take 1 capsule (100 mg total) by mouth daily as needed. 30 capsule 0  . hydrALAZINE (APRESOLINE) 25 MG tablet Take 2 tablets (50 mg total) every 8 (eight) hours by mouth. 270 tablet 4  . isosorbide mononitrate (IMDUR) 30 MG 24 hr tablet Take 1 tablet (30 mg total) by mouth daily. 90 tablet 1  . levothyroxine (SYNTHROID, LEVOTHROID) 100 MCG tablet Take 1 tablet (100 mcg total) daily before breakfast by mouth. 30 tablet 6  . Oxycodone HCl 10 MG TABS Take 1 tablet (10 mg total) by mouth every 4 (four)  hours as needed. 16 tablet 0  . traMADol (ULTRAM) 50 MG tablet Take 1 tablet (50 mg total) every 8 (eight) hours as needed by mouth. 20 tablet 0  . atorvastatin (LIPITOR) 20 MG tablet Take 2 tablets (40 mg total) by mouth daily. 180 tablet 0  . metoprolol tartrate (LOPRESSOR) 25 MG tablet Take 0.5 tablets (12.5 mg total) by mouth 2 (two) times daily. 60 tablet 3   No facility-administered medications prior to visit.      Allergies:   Codeine   Social History   Socioeconomic History  . Marital status: Single    Spouse name: None  . Number of children: None  . Years of education: None  . Highest education level: None  Social Needs  . Financial resource strain: None  . Food insecurity - worry: None  .  Food insecurity - inability: None  . Transportation needs - medical: None  . Transportation needs - non-medical: None  Occupational History  . None  Tobacco Use  . Smoking status: Current Every Day Smoker    Packs/day: 0.25    Years: 25.00    Pack years: 6.25    Types: Cigarettes  . Smokeless tobacco: Never Used  . Tobacco comment: Smoking 4-5 cigarettes daily on average  Substance and Sexual Activity  . Alcohol use: No    Alcohol/week: 0.0 oz  . Drug use: No  . Sexual activity: Not Currently  Other Topics Concern  . None  Social History Narrative  . None    Additional social history is notable that he is divorced and lives with one of his daughters.  He has 3 children and 6 grandchildren.  He worked for [employer] but is retired.there is a long-standing tobacco history since age 58. He does not exercise.  Family History:  The patient's family history includes Cancer (age of onset: 11) in his sister; Cancer (age of onset: 18) in his father.  His mother died at age 33.  Father died at age 36 and had diabetes and CAD.  He has 2 brothers, 2 living sisters, and one sister who died at age 79 with some cancer of her blood.  He has 3 children.  ROS General: Negative; No  fevers, chills, or night sweats;  HEENT: Negative; No changes in vision or hearing, sinus congestion, difficulty swallowing Pulmonary: Negative; No cough, wheezing, shortness of breath, hemoptysis Cardiovascular: Negative; No chest pain, presyncope, syncope, palpitations GI: Negative; No nausea, vomiting, diarrhea, or abdominal pain GU: recent left-sided flank pain, kidney stones, and hydronephrosis; that is post urology surgery and removal of his kidney stone. Musculoskeletal: hip discomfort Hematologic/Oncology: Negative; no easy bruising, bleeding Endocrine: Negative; no heat/cold intolerance; no diabetes Neuro: Negative; no changes in balance, headaches Skin: Negative; No rashes or skin lesions Psychiatric: Negative; No behavioral problems, depression Sleep: Negative; No snoring, daytime sleepiness, hypersomnolence, bruxism, restless legs, hypnogognic hallucinations, no cataplexy Other comprehensive 14 point system review is negative.   PHYSICAL EXAM:   VS:  BP 117/69   Pulse 86   Ht '5\' 9"'$  (1.753 m)   Wt 186 lb (84.4 kg)   BMI 27.47 kg/m     Repeat blood pressure by me was 116/70  Wt Readings from Last 3 Encounters:  05/09/17 186 lb (84.4 kg)  03/09/17 175 lb (79.4 kg)  02/11/17 173 lb (78.5 kg)    General: Alert, oriented, no distress.  Skin: normal turgor, no rashes, warm and dry HEENT: Normocephalic, atraumatic. Pupils equal round and reactive to light; sclera anicteric; extraocular muscles intact; Nose without nasal septal hypertrophy Mouth/Parynx benign; Mallinpatti scale 3 Neck: No JVD, no carotid bruits; normal carotid upstroke Lungs: clear to ausculatation and percussion; no wheezing or rales Chest wall: without tenderness to palpitation Heart: PMI not displaced, RRR, s1 s2 normal, 1/6 systolic murmur, no diastolic murmur, no rubs, gallops, thrills, or heaves Abdomen: soft, nontender; no hepatosplenomehaly, BS+; abdominal aorta nontender and not dilated by  palpation. Back: no CVA tenderness Pulses 2+ Musculoskeletal: full range of motion, normal strength, no joint deformities Extremities: no clubbing cyanosis or edema, Homan's sign negative  Neurologic: grossly nonfocal; Cranial nerves grossly wnl Psychologic: Normal mood and affect    Studies/Labs Reviewed:   EKG:  EKG is ordered today. ECG (independently read by me): Sinus rhythm with mild sinus arrhythmia at 86 bpm.  No significant ST-T  changes.  QTc interval 476 ms.  PR interval 14 2 ms.  January 11, 2017 ECG (independently read by me): Normal sinus rhythm at 65 bpm.  Nonspecific T-wave abnormality anteriorly. Normal intervals.  12/21/2016 ECG (independently read by me): Normal sinus rhythm at 63 bpm.  T-wave abnormality anteriorly V1 through V5.  Normal intervals.  No ectopy.  Recent Labs: BMP Latest Ref Rng & Units 03/09/2017 02/03/2017 01/11/2017  Glucose 65 - 99 mg/dL 100(H) 89 77  BUN 8 - 27 mg/dL _0 Creatinine 0.76 - 1.27 mg/dL 1.52(H) 1.38(H) 1.42(H)  BUN/Creat Ratio 10 - 24 10 - 11  Sodium 134 - 144 mmol/L 143 140 143  Potassium 3.5 - 5.2 mmol/L 3.9 4.1 3.9  Chloride 96 - 106 mmol/L 108(H) 110 107(H)  CO2 20 - 29 mmol/L 19(L) 23 20  Calcium 8.6 - 10.2 mg/dL 9.4 8.9 9.2     Hepatic Function Latest Ref Rng & Units 03/09/2017 02/03/2017 01/03/2017  Total Protein 6.0 - 8.5 g/dL 6.7 7.4 7.0  Albumin 3.6 - 4.8 g/dL 4.2 3.6 -  AST 0 - 40 IU/L _1 ALT 0 - 44 IU/L _2 Alk Phosphatase 39 - 117 IU/L 103 81 -  Total Bilirubin 0.0 - 1.2 mg/dL 0.3 0.6 0.4    CBC Latest Ref Rng & Units 02/03/2017 01/11/2017 01/10/2017  WBC 4.0 - 10.5 K/uL 9.1 10.2 10.2  Hemoglobin 13.0 - 17.0 g/dL 12.4(L) 12.6(L) 12.3(L)  Hematocrit 39.0 - 52.0 % 39.2 38.6 38.4(L)  Platelets 150 - 400 K/uL 230 256 219   Lab Results  Component Value Date   MCV 83.1 02/03/2017   MCV 83 01/11/2017   MCV 83.7 01/10/2017   Lab Results  Component Value Date   TSH 3.50 01/03/2017   Lab Results   Component Value Date   HGBA1C 5.3 03/09/2017     BNP No results found for: BNP  ProBNP No results found for: PROBNP   Lipid Panel     Component Value Date/Time   CHOL 128 03/09/2017 1030   TRIG 122 03/09/2017 1030   HDL 25 (L) 03/09/2017 1030   CHOLHDL 5.1 (H) 03/09/2017 1030   CHOLHDL 6.2 (H) 01/03/2017 1108   VLDL 36 (H) 07/18/2015 1005   LDLCALC 79 03/09/2017 1030   LDLCALC 120 (H) 01/03/2017 1108     RADIOLOGY: No results found.   Additional studies/ records that were reviewed today include:  I reviewed his recent ossicle is a she from September 2018    ASSESSMENT:    1. Coronary artery disease involving native coronary artery of native heart with angina pectoris ([place])   2. Medication management   3. Hyperlipidemia LDL goal <70   4. Abnormal nuclear cardiac imaging test   5. Tobacco abuse      PLAN:  Mr. Tagg Eustice is a 62 year old male who has a long-standing history of tobacco use, as well as a several year history of hypertension and hypothyroidism.    He also is felt to have stage III chronic kidney disease.  Remotely he had throat cancer and underwent chemotherapy and radiation and still smokes cigarettes.  He has been bothered with kidney stones leading to recent development of hydronephrosis requiring percutaneous nephrostomy.  As part of a preoperative evaluation for follow-up urology surgery T wave abnormality anteriorly on ECG preoperative testing.  His echo Doppler study revealed normal systolic function and normal diastolic parameters.  There was mild mitral regurgitation.  His nuclear  study showed hypoperfusion involving the mid anteroseptal, apical anterior, apical septal, and apical locations.  There is mild peri-infarction ischemia.  As result, he underwent diagnostic catheterization.  I again reviewed the results of this procedure with him in detail.  His LAD is subtotally stenosed but is well collateralized and this was responsible for the  ischemia noted on nuclear imaging.  He does have a focal distal RCA stenosis beyond the PDA vessel in the continuation branch.  On medical therapy.  He has been asymptomatic.  I am recommending further titration of metoprolol up to 25 mg twice a day and he will continue taking isosorbide 30 mg daily.  He also is on hydralazine 25 Mill grams every 8 hours and amlodipine 10 mg daily.  Blood pressure on therapy is stable.  I'm further titrating Lipitor to 40 mg daily for aggressive lipid-lowering therapy in attempt to induce plaque regression.  In the future if he develops symptomatology, we may consider attempting PCI to his distal RCA stenosis.  I again discussed the importance of smoking cessation.  I will see him back in the office in several months for follow up evaluation and prior to that office visit.  Repeat laboratory will be obtained.  Medication Adjustments/Labs and Tests Ordered: Current medicines are reviewed at length with the patient today.  Concerns regarding medicines are outlined above.  Medication changes, Labs and Tests ordered today are listed in the Patient Instructions below. Patient Instructions  Medication Instructions:  INCREASE atorvastatin (Lipitor) to 40 mg daily  INCREASE metoprolol tartrate (Lopressor) to 25 mg (1 tablet) two times daily  Labwork: Please return for FASTING labs in 3 months (CMET, Lipid)-lab orders provided  Follow-Up: Your physician wants you to follow-up in: 4 months with Dr. Claiborne Billings.  You will receive a reminder letter in the mail two months in advance. If you don't receive a letter, please call our office to schedule the follow-up appointment.   Any Other Special Instructions Will Be Listed Below (If Applicable).     If you need a refill on your cardiac medications before your next appointment, please call your pharmacy.      Signed, Shelva Majestic, MD  05/11/2017 8:01 PM    [facility] [address] [place],  [place], [place]  [address] Phone: [phone]

## 2017-05-09 NOTE — Patient Instructions (Signed)
Medication Instructions:  INCREASE atorvastatin (Lipitor) to 40 mg daily  INCREASE metoprolol tartrate (Lopressor) to 25 mg (1 tablet) two times daily  Labwork: Please return for FASTING labs in 3 months (CMET, Lipid)-lab orders provided  Follow-Up: Your physician wants you to follow-up in: 4 months with Dr. Claiborne Billings.  You will receive a reminder letter in the mail two months in advance. If you don't receive a letter, please call our office to schedule the follow-up appointment.   Any Other Special Instructions Will Be Listed Below (If Applicable).     If you need a refill on your cardiac medications before your next appointment, please call your pharmacy.

## 2017-05-11 ENCOUNTER — Encounter: Payer: Self-pay | Admitting: Cardiovascular Disease

## 2017-05-25 ENCOUNTER — Ambulatory Visit (INDEPENDENT_AMBULATORY_CARE_PROVIDER_SITE_OTHER): Payer: Self-pay | Admitting: Family Medicine

## 2017-05-25 ENCOUNTER — Encounter: Payer: Self-pay | Admitting: Family Medicine

## 2017-05-25 VITALS — BP 100/62 | HR 62 | Temp 98.2°F | Ht 69.0 in | Wt 183.4 lb

## 2017-05-25 DIAGNOSIS — M25551 Pain in right hip: Secondary | ICD-10-CM

## 2017-05-25 DIAGNOSIS — R319 Hematuria, unspecified: Secondary | ICD-10-CM

## 2017-05-25 DIAGNOSIS — R7989 Other specified abnormal findings of blood chemistry: Secondary | ICD-10-CM

## 2017-05-25 DIAGNOSIS — R0989 Other specified symptoms and signs involving the circulatory and respiratory systems: Secondary | ICD-10-CM

## 2017-05-25 LAB — POCT URINALYSIS DIP (DEVICE)
Bilirubin Urine: NEGATIVE
Glucose, UA: NEGATIVE mg/dL
Hgb urine dipstick: NEGATIVE
KETONES UR: NEGATIVE mg/dL
Leukocytes, UA: NEGATIVE
Nitrite: NEGATIVE
PH: 5.5 (ref 5.0–8.0)
PROTEIN: 30 mg/dL — AB
Specific Gravity, Urine: 1.025 (ref 1.005–1.030)
Urobilinogen, UA: 0.2 mg/dL (ref 0.0–1.0)

## 2017-05-25 MED ORDER — BUDESONIDE-FORMOTEROL FUMARATE 80-4.5 MCG/ACT IN AERO: 2.0000 | INHALATION_SPRAY | Freq: Two times a day (BID) | RESPIRATORY_TRACT | 3 refills | Status: DC

## 2017-05-25 MED ORDER — ALBUTEROL SULFATE HFA 108 (90 BASE) MCG/ACT IN AERS: 2.0000 | INHALATION_SPRAY | RESPIRATORY_TRACT | 1 refills | Status: DC | PRN

## 2017-05-25 MED ORDER — KETOROLAC TROMETHAMINE 30 MG/ML IJ SOLN
30.0000 mg | Freq: Once | INTRAMUSCULAR | Status: AC
Start: 2017-05-25 — End: 2017-05-30

## 2017-05-25 MED ORDER — PREDNISONE 20 MG PO TABS: 40.0000 mg | ORAL_TABLET | Freq: Every day | ORAL | 0 refills | Status: DC

## 2017-05-25 MED FILL — predniSONE 20 MG TABS: 20 | 5 days supply | Qty: 10 | Fill #0

## 2017-05-25 MED FILL — ALBUTEROL SULFATE HFA 108 (: 108 (90 BAS | 16 days supply | Qty: 18 | Fill #0

## 2017-05-25 MED FILL — SYMBICORT 80-4.5 MCG INH: 80-4.5 | 30 days supply | Qty: 10 | Fill #0

## 2017-05-25 NOTE — Progress Notes (Signed)
Patient ID: [name] [name], male    DOB: 1955/06/20, 62 y.o.   MRN: [id number]  PCP: Scot Jun, FNP  Chief Complaint  Patient presents with  . Follow-up    3 month on chronic condition    Subjective:  HPI [name] [name] is a 62 y.o. male presents for a 3 month follow-up of hematuria, hx of throat cancer, CAD, elevated creatinine and a new complaint today of hip pain. [name] [name] is current everyday smoker. He is s/p removal of nephrotomy tube due to development of obstructive renal stone. During prior labs [name] [name] creatinine has been moderately elevated. He has no know history of renal disease. He is a current daily smoker with 1.2 pack per day habit. Suffers from CAD and recently found to have 90% stenosis of LAD however PCI therapy is being considered by cardiology although not scheduled. Next cardiology follow-up scheduled in 4 months with Dr.Kelly. [name] denies chest pain, dizziness, or weakness. He endorses cough, chronic and shortness of breath.  Right hip pain started a few months ago. Pain is getting worst with increased activity. Improves with rest.  He has a history of osteoarthritis in other joints. He has not attempted relief with any medications. He is uninsured therefore no recent work-up by orthopedics. Denies new weakness of right leg. No prior injury or falls.  Social History   Socioeconomic History  . Marital status: Single    Spouse name: Not on file  . Number of children: Not on file  . Years of education: Not on file  . Highest education level: Not on file  Occupational History  . Not on file  Social Needs  . Financial resource strain: Not on file  . Food insecurity:    Worry: Not on file    Inability: Not on file  . Transportation needs:    Medical: Not on file    Non-medical: Not on file  Tobacco Use  . Smoking status: Current Every Day Smoker    Packs/day: 0.25    Years: 25.00    Pack years: 6.25    Types: Cigarettes  . Smokeless tobacco: Never  Used  . Tobacco comment: Smoking 4-5 cigarettes daily on average  Substance and Sexual Activity  . Alcohol use: No    Alcohol/week: 0.0 oz  . Drug use: No  . Sexual activity: Not Currently  Lifestyle  . Physical activity:    Days per week: Not on file    Minutes per session: Not on file  . Stress: Not on file  Relationships  . Social connections:    Talks on phone: Not on file    Gets together: Not on file    Attends religious service: Not on file    Active member of club or organization: Not on file    Attends meetings of clubs or organizations: Not on file    Relationship status: Not on file  . Intimate partner violence:    Fear of current or ex partner: Not on file    Emotionally abused: Not on file    Physically abused: Not on file    Forced sexual activity: Not on file  Other Topics Concern  . Not on file  Social History Narrative  . Not on file    Family History  Problem Relation Age of Onset  . Cancer Father 63       colon  . Cancer Sister 60       leukemia   Review of Systems  Pertinent negatives listed in HPI  Patient Active Problem List   Diagnosis Date Noted  . Renal calculi 02/11/2017  . Abnormal nuclear stress test   . Nephrolithiasis 11/14/2016  . Hypothyroidism 06/23/2015  . Noncompliance with follow up and medications 06/23/2015  . Acute kidney injury superimposed on chronic kidney disease ([place]) 06/22/2015  . Left nephrolithiasis 06/22/2015  . Hydronephrosis, left 06/22/2015  . Acute diverticulitis 06/22/2015  . 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 Renal insufficiency     Allergies  Allergen Reactions  . Codeine Nausea Only    Prior to Admission medications   Medication Sig Start Date End Date Taking? Authorizing Provider  amLODipine (NORVASC) 10 MG tablet  Take 1 tablet (10 mg total) daily by mouth. 01/03/17  Yes Scot Jun, FNP  atorvastatin (LIPITOR) 40 MG tablet Take 1 tablet (40 mg total) by mouth daily. 05/09/17 08/07/17 Yes [name], MD  hydrALAZINE (APRESOLINE) 25 MG tablet Take 2 tablets (50 mg total) every 8 (eight) hours by mouth. 01/11/17 06/10/17 Yes [name], MD  isosorbide mononitrate (IMDUR) 30 MG 24 hr tablet Take 1 tablet (30 mg total) by mouth daily. 03/09/17  Yes Scot Jun, FNP  metoprolol tartrate (LOPRESSOR) 25 MG tablet Take 1 tablet (25 mg total) by mouth 2 (two) times daily. 05/09/17  Yes [name], MD  levothyroxine (SYNTHROID, LEVOTHROID) 100 MCG tablet Take 1 tablet (100 mcg total) daily before breakfast by mouth. 01/03/17 05/09/17  Scot Jun, FNP  Oxycodone HCl 10 MG TABS Take 1 tablet (10 mg total) by mouth every 4 (four) hours as needed. Patient not taking: Reported on 05/25/2017 02/11/17   [name], MD  traMADol (ULTRAM) 50 MG tablet Take 1 tablet (50 mg total) every 8 (eight) hours as needed by mouth. Patient not taking: Reported on 05/25/2017 01/03/17   Scot Jun, FNP    Past Medical, Surgical Family and Social History reviewed and updated.    Objective:   Today's Vitals   05/25/17 1009  BP: 100/62  Pulse: 62  Temp: 98.2 F (36.8 C)  TempSrc: Oral  SpO2: 97%  Weight: 183 lb 6.4 oz (83.2 kg)  Height: 5\' 9"  (1.753 m)    Wt Readings from Last 3 Encounters:  05/25/17 183 lb 6.4 oz (83.2 kg)  05/09/17 186 lb (84.4 kg)  03/09/17 175 lb (79.4 kg)    Physical Exam  Constitutional: He is oriented to person, place, and time. He appears well-developed and well-nourished.  HENT:  Head: Normocephalic.  Eyes: Pupils are equal, round, and reactive to light. Conjunctivae and EOM are normal.  Neck: Normal range of motion. Neck supple.  Cardiovascular: Normal rate, regular rhythm, normal heart sounds and intact distal pulses.  Pulmonary/Chest: He has decreased  breath sounds. He has wheezes.  Musculoskeletal:       Right hip: He exhibits tenderness. He exhibits normal range of motion, normal strength, no crepitus and no deformity.  Neurological: He is alert and oriented to person, place, and time.  Skin: Skin is warm and dry.  Psychiatric: He has a normal mood and affect. His behavior is normal. Judgment normal.   Assessment & Plan:  1. Elevated serum creatinine, prior creatinine 1.52, GFR 48. Repeating today. Patient is encouraged to avoid nephrotoxic medications. Repeating a Comprehensive metabolic  panel today and if renal function has worsened, will refer to nephrology for further evaluation.  2. Right hip pain, will trial one dose of ketorolac (TORADOL) 30 MG/ML injection 30 mg vs.a course of NSAIDS due renal function. Recommend acetaminophen 500 mg every 6 hours as needed for pain. Will obtain HIP UNILAT W OR W/O PELVIS 2-3 VIEWS RIGHT. Patient may warrant a referral to orthopedics if symptoms persists and or imaging is abnormal.   3.  Hematuria, unspecified type, suspect this is residual finding associated with recent renal stone. Patient is followed closely by urology. Will defer management and work-up to urology.  4. Abnormal lung sounds, likely COPD associated with a long history if smoking. -Will treat with a short course of prednisone 40 mg x 5 days. -Start albuterol 2 puffs every 4-6 hours as needed for SOB and or wheezing -Start LABA Symbicort 2 puffs twice daily regardless of symptoms.   Meds ordered this encounter  Medications  . ketorolac (TORADOL) 30 MG/ML injection 30 mg  . albuterol (PROVENTIL HFA;VENTOLIN HFA) 108 (90 Base) MCG/ACT inhaler    Sig: Inhale 2 puffs into the lungs every 4 (four) hours as needed for wheezing or shortness of breath (cough, shortness of breath or wheezing.).    Dispense:  1 Inhaler    Refill:  1    Order Specific Question:   Supervising Provider    Answer:   Tresa Garter [id number]  .  budesonide-formoterol (SYMBICORT) 80-4.5 MCG/ACT inhaler    Sig: Inhale 2 puffs into the lungs 2 (two) times daily.    Dispense:  1 Inhaler    Refill:  3    Order Specific Question:   Supervising Provider    Answer:   Tresa Garter [id number]  . predniSONE (DELTASONE) 20 MG tablet    Sig: Take 2 tablets (40 mg total) by mouth daily with breakfast.    Dispense:  10 tablet    Refill:  0    Order Specific Question:   Supervising Provider    Answer:   Tresa Garter [[id number]]    Orders Placed This Encounter  Procedures  . DG HIP UNILAT W OR W/O PELVIS 2-3 VIEWS RIGHT    Standing Status:   Future    Standing Expiration Date:   05/25/2018    Order Specific Question:   Reason for Exam (SYMPTOM  OR DIAGNOSIS REQUIRED)    Answer:   right hip pain and weakness    Order Specific Question:   Preferred imaging location?    Answer:   External    Order Specific Question:   Call Results- Best Contact Number?    Answer:   [phone]  . Comprehensive metabolic panel  . POCT urinalysis dip (device)    RTC: 6 months for chronic conditons   Carroll Sage. [name], MSN, FNP-C [facility]  [address] [place], [place] [address] [phone]

## 2017-05-25 NOTE — Patient Instructions (Addendum)
For shortness of breath  which is likely secondary on-going smoking and underlying COPD.  -Start Symbicort  2 puffs twice daily regardless of symptoms to prevent bronchitis and worsening shortness of breath.  -Albuterol 2 puffs every 4-6 hours as needed for shortness of breath.     Shortness of Breath, Adult Shortness of breath means you have trouble breathing. Your lungs are organs for breathing. Follow these instructions at home: Pay attention to any changes in your symptoms. Take these actions to help with your condition:  Do not smoke. Smoking can cause shortness of breath. If you need help to quit smoking, ask your doctor.  Avoid things that can make it harder to breathe, such as: ? Mold. ? Dust. ? Air pollution. ? Chemical smells. ? Things that can cause allergy symptoms (allergens), if you have allergies.  Keep your living space clean and free of mold and dust.  Rest as needed. Slowly return to your usual activities.  Take over-the-counter and prescription medicines, including oxygen and inhaled medicines, only as told by your doctor.  Keep all follow-up visits as told by your doctor. This is important.  Contact a doctor if:  Your condition does not get better as soon as expected.  You have a hard time doing your normal activities, even after you rest.  You have new symptoms. Get help right away if:  You have trouble breathing when you are resting.  You feel light-headed or you faint.  You have a cough that is not helped by medicines.  You cough up blood.  You have pain with breathing.  You have pain in your chest, arms, shoulders, or belly (abdomen).  You have a fever.  You cannot walk up stairs.  You cannot exercise the way you normally do. This information is not intended to replace advice given to you by your health care provider. Make sure you discuss any questions you have with your health care provider. Document Released: 08/04/2007 Document  Revised: 03/04/2016 Document Reviewed: 03/04/2016 Elsevier Interactive Patient Education  2017 [employer].

## 2017-05-26 LAB — COMPREHENSIVE METABOLIC PANEL
A/G RATIO: 1.5 (ref 1.2–2.2)
ALBUMIN: 4.1 g/dL (ref 3.6–4.8)
ALT: 13 IU/L (ref 0–44)
AST: 14 IU/L (ref 0–40)
Alkaline Phosphatase: 99 IU/L (ref 39–117)
BILIRUBIN TOTAL: 0.4 mg/dL (ref 0.0–1.2)
BUN / CREAT RATIO: 9 — AB (ref 10–24)
BUN: 16 mg/dL (ref 8–27)
CHLORIDE: 110 mmol/L — AB (ref 96–106)
CO2: 18 mmol/L — ABNORMAL LOW (ref 20–29)
Calcium: 9.4 mg/dL (ref 8.6–10.2)
Creatinine, Ser: 1.73 mg/dL — ABNORMAL HIGH (ref 0.76–1.27)
GFR calc Af Amer: 48 mL/min/{1.73_m2} — ABNORMAL LOW (ref >59)
GFR calc non Af Amer: 41 mL/min/{1.73_m2} — ABNORMAL LOW (ref >59)
GLOBULIN, TOTAL: 2.7 g/dL (ref 1.5–4.5)
GLUCOSE: 102 mg/dL — AB (ref 65–99)
POTASSIUM: 4.1 mmol/L (ref 3.5–5.2)
SODIUM: 145 mmol/L — AB (ref 134–144)
Total Protein: 6.8 g/dL (ref 6.0–8.5)

## 2017-05-30 MED FILL — ISOSORBIDE MN ER 30 MG TAB: 30 | 30 days supply | Qty: 30 | Fill #2

## 2017-05-30 MED FILL — ATORVASTATIN 20 MG TABLET: 20 | 30 days supply | Qty: 30 | Fill #2

## 2017-05-30 MED FILL — ?LEVOTHYROXINE 100 MCG TAB: 100 | 30 days supply | Qty: 30 | Fill #2

## 2017-05-30 MED FILL — AMLODIPINE BESYLATE 10 MG T: 10 | 30 days supply | Qty: 30 | Fill #2

## 2017-06-02 ENCOUNTER — Telehealth: Payer: Self-pay | Admitting: Family Medicine

## 2017-06-02 DIAGNOSIS — N183 Chronic kidney disease, stage 3 unspecified: Secondary | ICD-10-CM

## 2017-06-02 NOTE — Telephone Encounter (Signed)
Left a vm for patient to callback 

## 2017-06-02 NOTE — Telephone Encounter (Signed)
Worsening renal functioning with creatinine rising. Notify patient for this reason he is being referred to nephrology. Referral placed and attach prior office note.   [name] [name]. [name], MSN, FNP-C [facility]  [address] [place], [place] [address] [phone]

## 2017-06-02 NOTE — Telephone Encounter (Signed)
Patient notified and will wait for [facility] to call.

## 2017-06-07 ENCOUNTER — Ambulatory Visit: Payer: Self-pay | Admitting: Family Medicine

## 2017-07-06 MED FILL — AMLODIPINE BESYLATE 10 MG T: 10 | 30 days supply | Qty: 30 | Fill #3

## 2017-07-06 MED FILL — METOPROLOL TARTRATE 25 MG T: 25 | 30 days supply | Qty: 60 | Fill #1

## 2017-07-06 MED FILL — hydrALAZINE HCL 25 MG TABS: 25 | 30 days supply | Qty: 270 | Fill #2

## 2017-07-06 MED FILL — ?LEVOTHYROXINE 100 MCG TAB: 100 | 30 days supply | Qty: 30 | Fill #3

## 2017-07-06 MED FILL — ISOSORBIDE MN ER 30 MG TAB: 30 | 30 days supply | Qty: 30 | Fill #3

## 2017-08-12 MED FILL — ISOSORBIDE MN ER 30 MG TAB: 30 | 30 days supply | Qty: 30 | Fill #4

## 2017-08-12 MED FILL — ?LEVOTHYROXINE 100 MCG TAB: 100 | 30 days supply | Qty: 30 | Fill #4

## 2017-08-12 MED FILL — METOPROLOL TARTRATE 25 MG T: 25 | 30 days supply | Qty: 60 | Fill #2

## 2017-08-12 MED FILL — AMLODIPINE BESYLATE 10 MG T: 10 | 30 days supply | Qty: 30 | Fill #4

## 2017-09-23 MED FILL — AMLODIPINE BESYLATE 10 MG T: 10 | 30 days supply | Qty: 30 | Fill #5

## 2017-09-23 MED FILL — ?LEVOTHYROXINE 100 MCG TAB: 100 | 30 days supply | Qty: 30 | Fill #5

## 2017-09-23 MED FILL — ISOSORBIDE MN ER 30 MG TAB: 30 | 30 days supply | Qty: 30 | Fill #5

## 2017-09-23 MED FILL — METOPROLOL TARTRATE 25 MG T: 25 | 30 days supply | Qty: 60 | Fill #3

## 2017-09-23 MED FILL — hydrALAZINE HCL 25 MG TABS: 25 | 30 days supply | Qty: 270 | Fill #3

## 2017-11-22 ENCOUNTER — Ambulatory Visit (INDEPENDENT_AMBULATORY_CARE_PROVIDER_SITE_OTHER): Payer: Self-pay | Admitting: Family Medicine

## 2017-11-22 ENCOUNTER — Encounter: Payer: Self-pay | Admitting: Family Medicine

## 2017-11-22 VITALS — BP 140/90 | HR 86 | Temp 98.1°F | Ht 69.0 in | Wt 191.6 lb

## 2017-11-22 DIAGNOSIS — Z720 Tobacco use: Secondary | ICD-10-CM

## 2017-11-22 DIAGNOSIS — R8290 Unspecified abnormal findings in urine: Secondary | ICD-10-CM

## 2017-11-22 DIAGNOSIS — Z131 Encounter for screening for diabetes mellitus: Secondary | ICD-10-CM

## 2017-11-22 DIAGNOSIS — K649 Unspecified hemorrhoids: Secondary | ICD-10-CM

## 2017-11-22 DIAGNOSIS — Z09 Encounter for follow-up examination after completed treatment for conditions other than malignant neoplasm: Secondary | ICD-10-CM

## 2017-11-22 DIAGNOSIS — R0602 Shortness of breath: Secondary | ICD-10-CM

## 2017-11-22 DIAGNOSIS — I10 Essential (primary) hypertension: Secondary | ICD-10-CM

## 2017-11-22 DIAGNOSIS — E039 Hypothyroidism, unspecified: Secondary | ICD-10-CM

## 2017-11-22 LAB — POCT URINALYSIS DIP (MANUAL ENTRY)
Bilirubin, UA: NEGATIVE
Blood, UA: NEGATIVE
Glucose, UA: NEGATIVE mg/dL
Ketones, POC UA: NEGATIVE mg/dL
Nitrite, UA: NEGATIVE
Protein Ur, POC: NEGATIVE mg/dL
Spec Grav, UA: 1.025 (ref 1.010–1.025)
Urobilinogen, UA: 0.2 E.U./dL
pH, UA: 5.5 (ref 5.0–8.0)

## 2017-11-22 LAB — POCT GLYCOSYLATED HEMOGLOBIN (HGB A1C): Hemoglobin A1C: 5.6 % (ref 4.0–5.6)

## 2017-11-22 MED ORDER — AMLODIPINE BESYLATE 10 MG PO TABS: 10.0000 mg | ORAL_TABLET | Freq: Every day | ORAL | 6 refills | Status: DC

## 2017-11-22 MED ORDER — HYDRALAZINE HCL 25 MG PO TABS: 50.0000 mg | ORAL_TABLET | Freq: Three times a day (TID) | ORAL | 6 refills | Status: DC

## 2017-11-22 MED ORDER — ATORVASTATIN CALCIUM 40 MG PO TABS: 40.0000 mg | ORAL_TABLET | Freq: Every day | ORAL | 6 refills | Status: DC

## 2017-11-22 MED ORDER — ALBUTEROL SULFATE HFA 108 (90 BASE) MCG/ACT IN AERS: 2.0000 | INHALATION_SPRAY | RESPIRATORY_TRACT | 12 refills | Status: DC | PRN

## 2017-11-22 MED ORDER — METOPROLOL TARTRATE 25 MG PO TABS: 25.0000 mg | ORAL_TABLET | Freq: Two times a day (BID) | ORAL | 3 refills | Status: DC

## 2017-11-22 MED ORDER — LEVOTHYROXINE SODIUM 100 MCG PO TABS: 100.0000 ug | ORAL_TABLET | Freq: Every day | ORAL | 6 refills | Status: DC

## 2017-11-22 MED ORDER — ISOSORBIDE MONONITRATE ER 30 MG PO TB24: 30.0000 mg | ORAL_TABLET | Freq: Every day | ORAL | 1 refills | Status: DC

## 2017-11-22 MED ORDER — BUDESONIDE-FORMOTEROL FUMARATE 80-4.5 MCG/ACT IN AERO: 2.0000 | INHALATION_SPRAY | Freq: Two times a day (BID) | RESPIRATORY_TRACT | 12 refills | Status: DC

## 2017-11-22 MED FILL — ISOSORBIDE MN ER 30 MG TAB: 30 | 30 days supply | Qty: 30 | Fill #0

## 2017-11-22 MED FILL — ALBUTEROL SULFATE HFA 108 (: 108 (90 BAS | 16 days supply | Qty: 18 | Fill #0

## 2017-11-22 MED FILL — AMLODIPINE BESYLATE 10 MG T: 10 | 30 days supply | Qty: 30 | Fill #0

## 2017-11-22 MED FILL — METOPROLOL TARTRATE 25 MG T: 25 | 30 days supply | Qty: 60 | Fill #0

## 2017-11-22 MED FILL — ?LEVOTHYROXINE 100 MCG TAB: 100 | 30 days supply | Qty: 30 | Fill #0

## 2017-11-22 MED FILL — SYMBICORT 80-4.5 MCG INH: 80-4.5 | 30 days supply | Qty: 10 | Fill #0

## 2017-11-22 MED FILL — ATORVASTATIN CALCIUM 40 MG: 40 | 30 days supply | Qty: 30 | Fill #0

## 2017-11-22 NOTE — Progress Notes (Signed)
Follow Up  Subjective:    Patient ID: [name] [name], male    DOB: March 12, 1955, 62 y.o.   MRN: [id number]   Chief Complaint  Patient presents with  . Follow-up    6 month on chronic condition  . Hemorrhoids   HPI  [name] [name] is a 62 year old male with a past medical history of Xerostomia, Smoking, Renal Insufficiency, Oropharyngeal, Hypothyroidism, Hypertension, Kidney Stones, Heart Murmur, Dyspnea, Throat Cancer, Depression, CAD, and Arthritis. He is here today for follow.   Current Status: Since his last office visit, he is doing well with no complaints. He is s/p: throat cancer. He states that he has hemorrhoids. He denies hematuria and hematochezia. He continues to have constipation. No  He continues to smoke 1/4 pack of cigarettes a day. He reports cough and shortness of breath. He denies visual changes, chest pain, heart palpitations, and falls. He has occasionally headaches and dizziness with position changes. Denies severe headaches, confusion, seizures, double vision, and blurred vision, nausea and vomiting.  He denies fevers, chills, fatigue, recent infections, weight loss, and night sweats. He has not had falls. No reports of GI problems such as nausea, vomiting, diarrhea, and constipation. No depression or anxiety reported. He denies pain today.   Past Medical History:  Diagnosis Date  . Arthritis   . Coronary artery disease   . Depression 06/12/11   Buproprion per Dr. Lamonte Sakai  . Dyspnea   . Full dentures    Fitting Per Dr. Enrique Sack  . Heart murmur   . History of kidney stones   . Hypertension   . Hypothyroidism   . Oropharyngeal cancer ([facility]) 2012   throat  . Protein calorie malnutrition ([place]) 05/31/11  . Renal insufficiency 2012   Secondary to Hx. of Cisplatin and Dhydrtion  . Smoking 06/12/11   1/2 pack/day  . Status post chemotherapy 10/30/10 - 11/09/10    2 doses of Q 3 week Cisplatin  . Status post radiation therapy 10/11/10 - 12/24/10   Bilateral Neck and Mucosa axis /   70 gray in 35 fractions  . Xerostomia     Family History  Problem Relation Age of Onset  . Cancer Father 66       colon  . Cancer Sister 20       leukemia    Social History   Socioeconomic History  . Marital status: Single    Spouse name: Not on file  . Number of children: Not on file  . Years of education: Not on file  . Highest education level: Not on file  Occupational History  . Not on file  Social Needs  . Financial resource strain: Not on file  . Food insecurity:    Worry: Not on file    Inability: Not on file  . Transportation needs:    Medical: Not on file    Non-medical: Not on file  Tobacco Use  . Smoking status: Current Every Day Smoker    Packs/day: 0.25    Years: 25.00    Pack years: 6.25    Types: Cigarettes  . Smokeless tobacco: Never Used  . Tobacco comment: Smoking 4-5 cigarettes daily on average  Substance and Sexual Activity  . Alcohol use: No    Alcohol/week: 0.0 standard drinks  . Drug use: No  . Sexual activity: Not Currently  Lifestyle  . Physical activity:    Days per week: Not on file    Minutes per session: Not on file  .  Stress: Not on file  Relationships  . Social connections:    Talks on phone: Not on file    Gets together: Not on file    Attends religious service: Not on file    Active member of club or organization: Not on file    Attends meetings of clubs or organizations: Not on file    Relationship status: Not on file  . Intimate partner violence:    Fear of current or ex partner: Not on file    Emotionally abused: Not on file    Physically abused: Not on file    Forced sexual activity: Not on file  Other Topics Concern  . Not on file  Social History Narrative  . Not on file    Past Surgical History:  Procedure Laterality Date  . APPENDECTOMY    . BIOPSY TONGUE     TonsilandBaseoftongue  . HERNIA REPAIR     umbilical with mesh  . IR NEPHROSTOMY EXCHANGE LEFT  01/10/2017  . IR NEPHROSTOMY PLACEMENT LEFT   11/15/2016  . LEFT HEART CATH AND CORONARY ANGIOGRAPHY N/A 01/18/2017   Procedure: LEFT HEART CATH AND CORONARY ANGIOGRAPHY;  Surgeon: [name], MD;  Location: [facility];  Service: Cardiovascular;  Laterality: N/A;  . NEPHROLITHOTOMY Left 02/11/2017   Procedure: NEPHROLITHOTOMY PERCUTANEOUS;  Surgeon: [name], MD;  Location: [facility];  Service: Urology;  Laterality: Left;  . PEG TUBE REMOVAL  04/13/11  . TONSILLECTOMY     right side    Immunization History  Administered Date(s) Administered  . Influenza,inj,Quad PF,6+ Mos 11/16/2016  . Pneumococcal Polysaccharide-23 06/24/2015    Current Meds  Medication Sig  . albuterol (PROVENTIL HFA;VENTOLIN HFA) 108 (90 Base) MCG/ACT inhaler Inhale 2 puffs into the lungs every 4 (four) hours as needed for wheezing or shortness of breath (cough, shortness of breath or wheezing.).  Marland Kitchen amLODipine (NORVASC) 10 MG tablet Take 1 tablet (10 mg total) by mouth daily.  . budesonide-formoterol (SYMBICORT) 80-4.5 MCG/ACT inhaler Inhale 2 puffs into the lungs 2 (two) times daily.  . isosorbide mononitrate (IMDUR) 30 MG 24 hr tablet Take 1 tablet (30 mg total) by mouth daily.  . [DISCONTINUED] albuterol (PROVENTIL HFA;VENTOLIN HFA) 108 (90 Base) MCG/ACT inhaler Inhale 2 puffs into the lungs every 4 (four) hours as needed for wheezing or shortness of breath (cough, shortness of breath or wheezing.).  . [DISCONTINUED] amLODipine (NORVASC) 10 MG tablet Take 1 tablet (10 mg total) daily by mouth.  . [DISCONTINUED] budesonide-formoterol (SYMBICORT) 80-4.5 MCG/ACT inhaler Inhale 2 puffs into the lungs 2 (two) times daily.  . [DISCONTINUED] isosorbide mononitrate (IMDUR) 30 MG 24 hr tablet Take 1 tablet (30 mg total) by mouth daily.    Allergies  Allergen Reactions  . Codeine Nausea Only    BP 140/90   Pulse 86   Temp 98.1 F (36.7 C) (Oral)   Ht 5\' 9"  (1.753 m)   Wt 191 lb 9.6 oz (86.9 kg)   SpO2 97%   BMI 28.29 kg/m    Review of Systems   Respiratory: Positive for shortness of breath.   Cardiovascular: Negative.   Gastrointestinal: Positive for abdominal distention (Obese).  Genitourinary: Negative.   Musculoskeletal: Negative.   Skin: Negative.   Neurological: Negative.   Hematological: Negative.   Psychiatric/Behavioral: Negative.    Objective:   Physical Exam  Constitutional: He is oriented to person, place, and time. He appears well-developed and well-nourished.  Neck: Normal range of motion. Neck supple.  Cardiovascular:  Normal rate, regular rhythm, normal heart sounds and intact distal pulses.  Pulmonary/Chest: Effort normal and breath sounds normal.  Abdominal: Soft. Bowel sounds are normal.  Neurological: He is alert and oriented to person, place, and time.  Skin: Skin is warm and dry.  Psychiatric: He has a normal mood and affect. His behavior is normal. Judgment and thought content normal.  Nursing note and vitals reviewed.  Assessment & Plan:   1. Hemorrhoids, unspecified hemorrhoid type  2. Essential hypertension Blood pressure is stable at 140/90 today. We will refill blood pressure medications today.  - amLODipine (NORVASC) 10 MG tablet; Take 1 tablet (10 mg total) by mouth daily.  Dispense: 30 tablet; Refill: 6 - isosorbide mononitrate (IMDUR) 30 MG 24 hr tablet; Take 1 tablet (30 mg total) by mouth daily.  Dispense: 90 tablet; Refill: 1 - atorvastatin (LIPITOR) 40 MG tablet; Take 1 tablet (40 mg total) by mouth daily.  Dispense: 30 tablet; Refill: 6 - hydrALAZINE (APRESOLINE) 25 MG tablet; Take 2 tablets (50 mg total) by mouth every 8 (eight) hours.  Dispense: 270 tablet; Refill: 6 - metoprolol tartrate (LOPRESSOR) 25 MG tablet; Take 1 tablet (25 mg total) by mouth 2 (two) times daily.  Dispense: 180 tablet; Refill: 3  3. Tobacco use He continues to smoke 1/4 today. He will contact office when he is ready for smoking cessation.   4. Screening for diabetes mellitus Hgb A1c was within normal range  at 5.6 today. She will continue to decrease foods/beverages high in sugars and carbs and follow Heart Healthy or DASH diet. Increase physical activity to at least 30 minutes cardio exercise daily.  - POCT glycosylated hemoglobin (Hb A1C) - POCT urinalysis dipstick  5. Hypothyroidism, unspecified type - levothyroxine (SYNTHROID, LEVOTHROID) 100 MCG tablet; Take 1 tablet (100 mcg total) by mouth daily before breakfast.  Dispense: 30 tablet; Refill: 6  6. Shortness of breath - albuterol (PROVENTIL HFA;VENTOLIN HFA) 108 (90 Base) MCG/ACT inhaler; Inhale 2 puffs into the lungs every 4 (four) hours as needed for wheezing or shortness of breath (cough, shortness of breath or wheezing.).  Dispense: 1 Inhaler; Refill: 12 - budesonide-formoterol (SYMBICORT) 80-4.5 MCG/ACT inhaler; Inhale 2 puffs into the lungs 2 (two) times daily.  Dispense: 1 Inhaler; Refill: 12  7. Follow up He will follow up 01/2018.   Meds ordered this encounter  Medications  . albuterol (PROVENTIL HFA;VENTOLIN HFA) 108 (90 Base) MCG/ACT inhaler    Sig: Inhale 2 puffs into the lungs every 4 (four) hours as needed for wheezing or shortness of breath (cough, shortness of breath or wheezing.).    Dispense:  1 Inhaler    Refill:  12  . amLODipine (NORVASC) 10 MG tablet    Sig: Take 1 tablet (10 mg total) by mouth daily.    Dispense:  30 tablet    Refill:  6    Patient will pick up when needed  . budesonide-formoterol (SYMBICORT) 80-4.5 MCG/ACT inhaler    Sig: Inhale 2 puffs into the lungs 2 (two) times daily.    Dispense:  1 Inhaler    Refill:  12  . isosorbide mononitrate (IMDUR) 30 MG 24 hr tablet    Sig: Take 1 tablet (30 mg total) by mouth daily.    Dispense:  90 tablet    Refill:  1  . atorvastatin (LIPITOR) 40 MG tablet    Sig: Take 1 tablet (40 mg total) by mouth daily.    Dispense:  30 tablet    Refill:  6  . hydrALAZINE (APRESOLINE) 25 MG tablet    Sig: Take 2 tablets (50 mg total) by mouth every 8 (eight)  hours.    Dispense:  270 tablet    Refill:  6    Patient will pick up when needed  . levothyroxine (SYNTHROID, LEVOTHROID) 100 MCG tablet    Sig: Take 1 tablet (100 mcg total) by mouth daily before breakfast.    Dispense:  30 tablet    Refill:  6    Patient will pick up when needed  . metoprolol tartrate (LOPRESSOR) 25 MG tablet    Sig: Take 1 tablet (25 mg total) by mouth 2 (two) times daily.    Dispense:  180 tablet    Refill:  3    Patient will pick up when needed    Kathe Becton,  MSN, FNP-C [facility] [address] [place], [place] [address] [phone]

## 2017-11-24 LAB — URINE CULTURE

## 2017-11-25 ENCOUNTER — Ambulatory Visit: Payer: Self-pay | Admitting: Family Medicine

## 2017-11-30 ENCOUNTER — Telehealth: Payer: Self-pay

## 2017-11-30 NOTE — Telephone Encounter (Signed)
-----   Message from Azzie Glatter, [place] sent at 11/29/2017  7:06 PM EDT ----- Regarding: "Urine Culture Results" Inform patient that results of Urine Culture are stable. No additional antibiotic is needed. Keep follow up appointment as scheduled.    Thank you.

## 2017-11-30 NOTE — Telephone Encounter (Signed)
-----   Message from Azzie Glatter, [place] sent at 11/29/2017  7:06 PM EDT ----- Regarding: "Urine Culture Results" Inform patient that results of Urine Culture are stable. No additional antibiotic is needed. Keep follow up appointment as scheduled.    Thank you.

## 2017-11-30 NOTE — Telephone Encounter (Signed)
Left a vm for patient to callback 

## 2017-12-01 ENCOUNTER — Telehealth: Payer: Self-pay

## 2017-12-01 NOTE — Telephone Encounter (Signed)
Patient notified

## 2017-12-01 NOTE — Telephone Encounter (Signed)
-----   Message from Azzie Glatter, [place] sent at 11/29/2017  7:06 PM EDT ----- Regarding: "Urine Culture Results" Inform patient that results of Urine Culture are stable. No additional antibiotic is needed. Keep follow up appointment as scheduled.    Thank you.

## 2017-12-02 ENCOUNTER — Telehealth: Payer: Self-pay | Admitting: Family Medicine

## 2017-12-13 ENCOUNTER — Other Ambulatory Visit: Payer: Self-pay | Admitting: Family Medicine

## 2017-12-13 NOTE — Progress Notes (Unsigned)
k

## 2017-12-14 ENCOUNTER — Telehealth: Payer: Self-pay

## 2017-12-14 ENCOUNTER — Other Ambulatory Visit: Payer: Self-pay | Admitting: Family Medicine

## 2017-12-14 DIAGNOSIS — K642 Third degree hemorrhoids: Secondary | ICD-10-CM

## 2017-12-14 NOTE — Telephone Encounter (Signed)
Patient notified and will go have order done tomorrow.

## 2017-12-14 NOTE — Telephone Encounter (Signed)
-----   Message from Azzie Glatter, [place] sent at 12/14/2017  9:08 AM EDT ----- Regarding: "KUB order" [name] [name],   Order placed for KUB for hemorrhoids. Patient is uninsured. Please investigate if he can get some kind of assistance.  Please contact patient with info. He did have [place] in the past.   Thanks [name] [name]!

## 2017-12-14 NOTE — Progress Notes (Signed)
Order for KUB sent today.

## 2017-12-27 MED FILL — ?LEVOTHYROXINE 100 MCG TAB: 100 | 30 days supply | Qty: 30 | Fill #1

## 2017-12-27 MED FILL — AMLODIPINE BESYLATE 10 MG T: 10 | 30 days supply | Qty: 30 | Fill #1

## 2017-12-27 MED FILL — METOPROLOL TARTRATE 25 MG T: 25 | 30 days supply | Qty: 60 | Fill #1

## 2017-12-27 MED FILL — ATORVASTATIN CALCIUM 40 MG: 40 | 30 days supply | Qty: 30 | Fill #1

## 2017-12-27 MED FILL — hydrALAZINE HCL 25 MG TABS: 25 | 30 days supply | Qty: 270 | Fill #4

## 2017-12-27 MED FILL — ISOSORBIDE MN ER 30 MG TAB: 30 | 30 days supply | Qty: 30 | Fill #1

## 2018-02-07 MED FILL — AMLODIPINE BESYLATE 10 MG T: 10 | 30 days supply | Qty: 30 | Fill #2

## 2018-02-07 MED FILL — ISOSORBIDE MN ER 30 MG TAB: 30 | 30 days supply | Qty: 30 | Fill #2

## 2018-02-07 MED FILL — METOPROLOL TARTRATE 25 MG T: 25 | 30 days supply | Qty: 60 | Fill #2

## 2018-02-07 MED FILL — ATORVASTATIN CALCIUM 40 MG: 40 | 30 days supply | Qty: 30 | Fill #2

## 2018-02-07 MED FILL — ?LEVOTHYROXINE 100 MCG TAB: 100 | 30 days supply | Qty: 30 | Fill #2

## 2018-02-27 ENCOUNTER — Ambulatory Visit: Payer: Self-pay | Admitting: Family Medicine

## 2018-03-10 ENCOUNTER — Encounter: Payer: Self-pay | Admitting: Family Medicine

## 2018-03-10 ENCOUNTER — Ambulatory Visit (INDEPENDENT_AMBULATORY_CARE_PROVIDER_SITE_OTHER): Payer: Self-pay | Admitting: Family Medicine

## 2018-03-10 VITALS — BP 110/64 | HR 66 | Temp 98.1°F | Ht 69.0 in | Wt 188.0 lb

## 2018-03-10 DIAGNOSIS — R0602 Shortness of breath: Secondary | ICD-10-CM

## 2018-03-10 DIAGNOSIS — I10 Essential (primary) hypertension: Secondary | ICD-10-CM

## 2018-03-10 DIAGNOSIS — K649 Unspecified hemorrhoids: Secondary | ICD-10-CM

## 2018-03-10 DIAGNOSIS — Z09 Encounter for follow-up examination after completed treatment for conditions other than malignant neoplasm: Secondary | ICD-10-CM

## 2018-03-10 DIAGNOSIS — Z0000 Encounter for general adult medical examination without abnormal findings: Secondary | ICD-10-CM

## 2018-03-10 LAB — POCT URINALYSIS DIP (MANUAL ENTRY)
Bilirubin, UA: NEGATIVE
Blood, UA: NEGATIVE
Glucose, UA: NEGATIVE mg/dL
Ketones, POC UA: NEGATIVE mg/dL
Leukocytes, UA: NEGATIVE
Nitrite, UA: NEGATIVE
Protein Ur, POC: 30 mg/dL — AB
Spec Grav, UA: 1.025 (ref 1.010–1.025)
Urobilinogen, UA: 0.2 E.U./dL
pH, UA: 5.5 (ref 5.0–8.0)

## 2018-03-10 NOTE — Progress Notes (Signed)
Follow Up  Subjective:    Patient ID: [name] [name], male    DOB: Sep 05, 1955, 63 y.o.   MRN: [id number]  Chief Complaint  Patient presents with  . Follow-up    Chronic condtion     HPI  [name] [name] is a 63 year old male with a past medical history of Xerostomia, Smoking, Renal Insufficiency, Oropharyngeal Cancer, Hypothyroidism, Hypertension, History of Kidney Stones, Heart Murmur, Full Dentures, Dyspnea, Depression, CAD, and Arthritis. He is here today for follow up.   Current Status: Since his last office visit, he is doing well and has c/o right ear pain. He states the he has not gotten the Abdominal X-ray for hemorrhoids, and he has not had any rectal bleeding lately. No reports of GI problems such as nausea, vomiting, diarrhea, and constipation. He has no reports of blood in stools, dysuria and hematuria.  He denies fevers, chills, fatigue, recent infections, weight loss, and night sweats. He has not had any headaches, visual changes, dizziness, and falls. No chest pain, heart palpitations, cough and shortness of breath reported. No depression or anxiety reported.  He denies pain today.   Objective:   Physical Exam Vitals signs and nursing note reviewed.  Constitutional:      Appearance: Normal appearance. He is well-developed and well-nourished.  HENT:     Head: Normocephalic and atraumatic.     Right Ear: External ear normal.     Left Ear: External ear normal.     Nose: Nose normal.  Eyes:  System normal.    Conjunctiva/sclera: Conjunctivae normal.  Neck:     Musculoskeletal: Normal range of motion and neck supple.  Cardiovascular:     Rate and Rhythm: Normal rate and regular rhythm.     Pulses: Normal pulses.     Heart sounds: Normal heart sounds.  Pulmonary:     Effort: Pulmonary effort is normal.     Breath sounds: Normal breath sounds.  Abdominal:     General: Abdomen is flat. Bowel sounds are normal.     Palpations: Abdomen is soft.  Musculoskeletal: Normal range  of motion.  Skin:    General: Skin is warm and dry.     Capillary Refill: Capillary refill takes less than 2 seconds.  Neurological:     General: No focal deficit present.     Mental Status: He is alert and oriented to person, place, and time.  Psychiatric:        Mood and Affect: Mood normal.        Behavior: Behavior normal.        Thought Content: Thought content normal.        Judgment: Judgment normal.    Assessment & Plan:   1. Essential hypertension BP Readings from Last 3 Encounters:  03/10/18 110/64  11/22/17 140/90  05/25/17 100/62  Antihypertensive medications are effective. He will continue Amlodipine, Hydralazine, Imdur, and Metoprolol as prescribed. She will continue to decrease high sodium intake, excessive alcohol intake, increase potassium intake, smoking cessation, and increase physical activity of at least 30 minutes of cardio activity daily. He will continue to follow Heart Healthy or DASH diet.  2. Hemorrhoids, unspecified hemorrhoid type Improved. No reports of bleeding reported. Order placed 11/2017 for abdominal CT to further assess hemorrhoids. Patient states that he will get scan soon. Continue to monitor.   3. Shortness of breath Stable today. No signs and symptoms of respiratory distress reported or noted.   4. Healthcare maintenance - HgB A1c - Comprehensive  metabolic panel - Lipid Panel - CBC with Differential - TSH - Vitamin D 1,25 dihydroxy  5. Follow up He will follow up in 6 months.  - POCT urinalysis dipstick  No orders of the defined types were placed in this encounter.  [name] Becton,  MSN, FNP-C [facility] [address] [place], [place] [address] [phone]

## 2018-03-14 LAB — COMPREHENSIVE METABOLIC PANEL
ALT: 20 IU/L (ref 0–44)
AST: 17 IU/L (ref 0–40)
Albumin/Globulin Ratio: 1.5 (ref 1.2–2.2)
Albumin: 4 g/dL (ref 3.6–4.8)
Alkaline Phosphatase: 110 IU/L (ref 39–117)
BUN/Creatinine Ratio: 9 — ABNORMAL LOW (ref 10–24)
BUN: 14 mg/dL (ref 8–27)
Bilirubin Total: 0.4 mg/dL (ref 0.0–1.2)
CO2: 20 mmol/L (ref 20–29)
Calcium: 9.2 mg/dL (ref 8.6–10.2)
Chloride: 108 mmol/L — ABNORMAL HIGH (ref 96–106)
Creatinine, Ser: 1.57 mg/dL — ABNORMAL HIGH (ref 0.76–1.27)
GFR calc Af Amer: 53 mL/min/{1.73_m2} — ABNORMAL LOW (ref >59)
GFR calc non Af Amer: 46 mL/min/{1.73_m2} — ABNORMAL LOW (ref >59)
Globulin, Total: 2.6 g/dL (ref 1.5–4.5)
Glucose: 96 mg/dL (ref 65–99)
Potassium: 4.4 mmol/L (ref 3.5–5.2)
Sodium: 146 mmol/L — ABNORMAL HIGH (ref 134–144)
Total Protein: 6.6 g/dL (ref 6.0–8.5)

## 2018-03-14 LAB — CBC WITH DIFFERENTIAL/PLATELET
Basophils Absolute: 0.1 10*3/uL (ref 0.0–0.2)
Basos: 1 %
EOS (ABSOLUTE): 0.2 10*3/uL (ref 0.0–0.4)
Eos: 2 %
Hematocrit: 44.5 % (ref 37.5–51.0)
Hemoglobin: 14.4 g/dL (ref 13.0–17.7)
Immature Grans (Abs): 0.1 10*3/uL (ref 0.0–0.1)
Immature Granulocytes: 1 %
Lymphocytes Absolute: 1.2 10*3/uL (ref 0.7–3.1)
Lymphs: 11 %
MCH: 26.6 pg (ref 26.6–33.0)
MCHC: 32.4 g/dL (ref 31.5–35.7)
MCV: 82 fL (ref 79–97)
Monocytes Absolute: 0.6 10*3/uL (ref 0.1–0.9)
Monocytes: 6 %
Neutrophils Absolute: 8.3 10*3/uL — ABNORMAL HIGH (ref 1.4–7.0)
Neutrophils: 79 %
Platelets: 246 10*3/uL (ref 150–450)
RBC: 5.42 x10E6/uL (ref 4.14–5.80)
RDW: 15.4 % (ref 11.6–15.4)
WBC: 10.3 10*3/uL (ref 3.4–10.8)

## 2018-03-14 LAB — LIPID PANEL
Chol/HDL Ratio: 5.1 ratio — ABNORMAL HIGH (ref 0.0–5.0)
Cholesterol, Total: 128 mg/dL (ref 100–199)
HDL: 25 mg/dL — ABNORMAL LOW (ref >39)
LDL Calculated: 71 mg/dL (ref 0–99)
Triglycerides: 159 mg/dL — ABNORMAL HIGH (ref 0–149)
VLDL Cholesterol Cal: 32 mg/dL (ref 5–40)

## 2018-03-14 LAB — VITAMIN D 1,25 DIHYDROXY
Vitamin D 1, 25 (OH)2 Total: 19 pg/mL — ABNORMAL LOW
Vitamin D2 1, 25 (OH)2: <10 pg/mL
Vitamin D3 1, 25 (OH)2: 19 pg/mL

## 2018-03-14 LAB — TSH: TSH: 7.58 u[IU]/mL — ABNORMAL HIGH (ref 0.450–4.500)

## 2018-03-20 MED FILL — ATORVASTATIN CALCIUM 40 MG: 40 | 30 days supply | Qty: 30 | Fill #3

## 2018-03-20 MED FILL — AMLODIPINE BESYLATE 10 MG T: 10 | 30 days supply | Qty: 30 | Fill #3

## 2018-03-20 MED FILL — METOPROLOL TARTRATE 25 MG T: 25 | 30 days supply | Qty: 60 | Fill #3

## 2018-03-20 MED FILL — ISOSORBIDE MN ER 30 MG TAB: 30 | 30 days supply | Qty: 30 | Fill #3

## 2018-03-22 ENCOUNTER — Telehealth: Payer: Self-pay | Admitting: Family Medicine

## 2018-03-22 ENCOUNTER — Other Ambulatory Visit: Payer: Self-pay | Admitting: Family Medicine

## 2018-03-22 DIAGNOSIS — E559 Vitamin D deficiency, unspecified: Secondary | ICD-10-CM

## 2018-03-22 DIAGNOSIS — I10 Essential (primary) hypertension: Secondary | ICD-10-CM

## 2018-03-22 DIAGNOSIS — E039 Hypothyroidism, unspecified: Secondary | ICD-10-CM

## 2018-03-22 MED ORDER — VITAMIN D (ERGOCALCIFEROL) 1.25 MG (50000 UNIT) PO CAPS: 50000.0000 [IU] | ORAL_CAPSULE | ORAL | 3 refills | Status: DC

## 2018-03-22 MED ORDER — LEVOTHYROXINE SODIUM 137 MCG PO TABS: 137.0000 ug | ORAL_TABLET | Freq: Every day | ORAL | 3 refills | Status: DC

## 2018-03-22 MED ORDER — HYDRALAZINE HCL 25 MG PO TABS: 50.0000 mg | ORAL_TABLET | Freq: Three times a day (TID) | ORAL | 6 refills | Status: DC

## 2018-03-22 MED FILL — hydrALAZINE HCL 25 MG TABS: 25 | 45 days supply | Qty: 270 | Fill #0

## 2018-03-22 MED FILL — VIT D2 1.25 MG (50,000 UNIT: 1.25 MG | 84 days supply | Qty: 12 | Fill #0

## 2018-03-22 MED FILL — LEVOTHYROXINE 137 MCG TAB: 137 | 30 days supply | Qty: 30 | Fill #0

## 2018-03-22 NOTE — Telephone Encounter (Signed)
Spoke to patient this morning concerning increased TSH levels. He will increase Thyroid medication to 137 mcg daily. We will re-evaluate TSH in 6 weeks.

## 2018-03-22 NOTE — Telephone Encounter (Signed)
Multiple message left on patient's voicemail to call office to discusss TSH lab results. Attempted again today and voice message left to contact office.

## 2018-03-23 ENCOUNTER — Telehealth: Payer: Self-pay

## 2018-03-23 NOTE — Telephone Encounter (Signed)
Patient notified

## 2018-03-23 NOTE — Telephone Encounter (Signed)
-----   Message from Azzie Glatter, [place] sent at 03/22/2018  9:20 AM EST ----- Regarding: "Dose Change" Please inform patient of dose increase of Levothyroxine from 100 mcg to 137 mcg.  Rx sent to pharmacy today. Thank you.

## 2018-03-23 NOTE — Telephone Encounter (Signed)
-----   Message from Azzie Glatter, [place] sent at 03/22/2018  9:26 AM EST ----- Regarding: "New Rx" Vitamin D levels decreased also. Please inform patient to pick up Rx for Vitamin D 50,000 capsules to take 1 weekly. Sent to pharmacy today. Thank you.

## 2018-04-26 MED FILL — METOPROLOL TARTRATE 25 MG T: 25 | 30 days supply | Qty: 60 | Fill #4

## 2018-04-26 MED FILL — LEVOTHYROXINE 137 MCG TAB: 137 | 30 days supply | Qty: 30 | Fill #1

## 2018-04-26 MED FILL — ISOSORBIDE MN ER 30 MG TAB: 30 | 30 days supply | Qty: 30 | Fill #4

## 2018-04-26 MED FILL — ATORVASTATIN CALCIUM 40 MG: 40 | 30 days supply | Qty: 30 | Fill #4

## 2018-04-26 MED FILL — AMLODIPINE BESYLATE 10 MG T: 10 | 30 days supply | Qty: 30 | Fill #4

## 2018-05-04 ENCOUNTER — Other Ambulatory Visit: Payer: Self-pay

## 2018-05-08 ENCOUNTER — Other Ambulatory Visit: Payer: Self-pay

## 2018-05-08 DIAGNOSIS — E039 Hypothyroidism, unspecified: Secondary | ICD-10-CM

## 2018-05-09 LAB — TSH: TSH: 1.24 u[IU]/mL (ref 0.450–4.500)

## 2018-05-24 ENCOUNTER — Ambulatory Visit: Payer: Self-pay | Admitting: Family Medicine

## 2018-06-12 ENCOUNTER — Telehealth: Payer: Self-pay

## 2018-06-12 MED FILL — ?LEVOTHYROXINE 137 MCG TAB: 137 | 30 days supply | Qty: 30 | Fill #2

## 2018-06-12 MED FILL — ATORVASTATIN CALCIUM 40 MG: 40 | 30 days supply | Qty: 30 | Fill #5

## 2018-06-12 MED FILL — AMLODIPINE BESYLATE 10 MG T: 10 | 30 days supply | Qty: 30 | Fill #5

## 2018-06-12 MED FILL — ISOSORBIDE MN ER 30 MG TAB: 30 | 30 days supply | Qty: 30 | Fill #5

## 2018-06-12 NOTE — Telephone Encounter (Signed)
Patient called and stated he [facility] and was informed he could only do mail order with a credit card.  He stated he has been out of his medication for a week now.  I contacted the head of Pharmacy and they subsequently contacted the outpatient lead and were able to correct the situation and get the patient his medications.  The patient called back later and left a message stating he was able to pickup his medication today and that he was grateful we were able to help him.

## 2018-06-27 NOTE — Telephone Encounter (Signed)
Message sent to provider 

## 2018-07-03 MED FILL — VIT D2 1.25 MG (50,000 UNIT: 1.25 MG | 55 days supply | Qty: 8 | Fill #1

## 2018-07-03 MED FILL — hydrALAZINE HCL 25 MG TABS: 25 | 45 days supply | Qty: 270 | Fill #1

## 2018-07-03 MED FILL — METOPROLOL TARTRATE 25 MG T: 25 | 30 days supply | Qty: 60 | Fill #5

## 2018-07-04 ENCOUNTER — Other Ambulatory Visit: Payer: Self-pay | Admitting: Family Medicine

## 2018-07-04 DIAGNOSIS — I10 Essential (primary) hypertension: Secondary | ICD-10-CM

## 2018-07-04 MED FILL — AMLODIPINE BESYLATE 10 MG T: 10 | 30 days supply | Qty: 30 | Fill #6

## 2018-07-04 MED FILL — SYMBICORT 80-4.5 MCG INH: 80-4.5 | 30 days supply | Qty: 10 | Fill #1

## 2018-07-04 MED FILL — !VENTOLIN HFA INHALER: 108 (90 BAS | 16 days supply | Qty: 18 | Fill #1

## 2018-07-04 MED FILL — ?LEVOTHYROXINE 137 MCG TAB: 137 | 30 days supply | Qty: 30 | Fill #3

## 2018-07-04 MED FILL — ATORVASTATIN CALCIUM 40 MG: 40 | 30 days supply | Qty: 30 | Fill #6

## 2018-08-18 ENCOUNTER — Other Ambulatory Visit: Payer: Self-pay

## 2018-08-18 DIAGNOSIS — E039 Hypothyroidism, unspecified: Secondary | ICD-10-CM

## 2018-08-18 DIAGNOSIS — I10 Essential (primary) hypertension: Secondary | ICD-10-CM

## 2018-08-18 MED ORDER — AMLODIPINE BESYLATE 10 MG PO TABS: 10.0000 mg | ORAL_TABLET | Freq: Every day | ORAL | 0 refills | Status: DC

## 2018-08-18 MED ORDER — ATORVASTATIN CALCIUM 40 MG PO TABS: 40.0000 mg | ORAL_TABLET | Freq: Every day | ORAL | 0 refills | Status: DC

## 2018-08-18 MED ORDER — LEVOTHYROXINE SODIUM 137 MCG PO TABS: 137.0000 ug | ORAL_TABLET | Freq: Every day | ORAL | 0 refills | Status: DC

## 2018-08-18 MED ORDER — METOPROLOL TARTRATE 25 MG PO TABS: 25.0000 mg | ORAL_TABLET | Freq: Two times a day (BID) | ORAL | 0 refills | Status: DC

## 2018-08-18 MED ORDER — HYDRALAZINE HCL 25 MG PO TABS: 50.0000 mg | ORAL_TABLET | Freq: Three times a day (TID) | ORAL | 0 refills | Status: DC

## 2018-08-18 MED FILL — ?METOPROLOL 25 MG TABLET: 25 | 30 days supply | Qty: 60 | Fill #0

## 2018-08-18 MED FILL — ?ATORVASTATIN 40MG TABLET: 40 | 30 days supply | Qty: 30 | Fill #0

## 2018-08-18 MED FILL — ?LEVOTHYROXINE 137 MCG TAB: 137 | 30 days supply | Qty: 30 | Fill #0

## 2018-08-18 MED FILL — hydrALAZINE HCL 25 MG TABS: 25 | 30 days supply | Qty: 180 | Fill #0

## 2018-08-18 MED FILL — ?AMLODIPINE BESYLATE 10 MG: 10 | 30 days supply | Qty: 30 | Fill #0

## 2018-08-18 NOTE — Telephone Encounter (Signed)
Enough medication sent into the pharmacy until patient appointment in July

## 2018-09-08 ENCOUNTER — Ambulatory Visit (INDEPENDENT_AMBULATORY_CARE_PROVIDER_SITE_OTHER): Payer: Self-pay | Admitting: Family Medicine

## 2018-09-08 ENCOUNTER — Other Ambulatory Visit: Payer: Self-pay

## 2018-09-08 ENCOUNTER — Encounter: Payer: Self-pay | Admitting: Family Medicine

## 2018-09-08 VITALS — BP 124/70 | HR 66 | Temp 98.0°F | Ht 69.0 in | Wt 177.0 lb

## 2018-09-08 DIAGNOSIS — E559 Vitamin D deficiency, unspecified: Secondary | ICD-10-CM | POA: Insufficient documentation

## 2018-09-08 DIAGNOSIS — F17200 Nicotine dependence, unspecified, uncomplicated: Secondary | ICD-10-CM | POA: Insufficient documentation

## 2018-09-08 DIAGNOSIS — I10 Essential (primary) hypertension: Secondary | ICD-10-CM | POA: Insufficient documentation

## 2018-09-08 DIAGNOSIS — R0602 Shortness of breath: Secondary | ICD-10-CM | POA: Insufficient documentation

## 2018-09-08 DIAGNOSIS — F4321 Adjustment disorder with depressed mood: Secondary | ICD-10-CM | POA: Insufficient documentation

## 2018-09-08 DIAGNOSIS — E039 Hypothyroidism, unspecified: Secondary | ICD-10-CM

## 2018-09-08 DIAGNOSIS — Z720 Tobacco use: Secondary | ICD-10-CM

## 2018-09-08 DIAGNOSIS — R05 Cough: Secondary | ICD-10-CM

## 2018-09-08 DIAGNOSIS — Z716 Tobacco abuse counseling: Secondary | ICD-10-CM

## 2018-09-08 DIAGNOSIS — Z09 Encounter for follow-up examination after completed treatment for conditions other than malignant neoplasm: Secondary | ICD-10-CM

## 2018-09-08 DIAGNOSIS — R059 Cough, unspecified: Secondary | ICD-10-CM | POA: Insufficient documentation

## 2018-09-08 LAB — POCT URINALYSIS DIP (MANUAL ENTRY)
Bilirubin, UA: NEGATIVE
Blood, UA: NEGATIVE
Glucose, UA: NEGATIVE mg/dL
Ketones, POC UA: NEGATIVE mg/dL
Leukocytes, UA: NEGATIVE
Nitrite, UA: NEGATIVE
Protein Ur, POC: 30 mg/dL — AB
Spec Grav, UA: 1.025 (ref 1.010–1.025)
Urobilinogen, UA: 0.2 E.U./dL
pH, UA: 5.5 (ref 5.0–8.0)

## 2018-09-08 MED ORDER — AMLODIPINE BESYLATE 10 MG PO TABS: 10.0000 mg | ORAL_TABLET | Freq: Every day | ORAL | 6 refills | Status: DC

## 2018-09-08 MED ORDER — ATORVASTATIN CALCIUM 40 MG PO TABS: 40.0000 mg | ORAL_TABLET | Freq: Every day | ORAL | 6 refills | Status: DC

## 2018-09-08 MED ORDER — VITAMIN D (ERGOCALCIFEROL) 1.25 MG (50000 UNIT) PO CAPS: 50000.0000 [IU] | ORAL_CAPSULE | ORAL | 6 refills | Status: DC

## 2018-09-08 MED ORDER — HYDRALAZINE HCL 25 MG PO TABS: 50.0000 mg | ORAL_TABLET | Freq: Three times a day (TID) | ORAL | 6 refills | Status: DC

## 2018-09-08 MED ORDER — METOPROLOL TARTRATE 25 MG PO TABS: 25.0000 mg | ORAL_TABLET | Freq: Two times a day (BID) | ORAL | 6 refills | Status: DC

## 2018-09-08 MED ORDER — ISOSORBIDE MONONITRATE ER 30 MG PO TB24: 30.0000 mg | ORAL_TABLET | Freq: Every day | ORAL | 6 refills | Status: DC

## 2018-09-08 MED ORDER — LEVOTHYROXINE SODIUM 137 MCG PO TABS: 137.0000 ug | ORAL_TABLET | Freq: Every day | ORAL | 6 refills | Status: DC

## 2018-09-08 MED FILL — ?METOPROLOL 25 MG TABLET: 25 | 30 days supply | Qty: 60 | Fill #0

## 2018-09-08 MED FILL — ISOSORBIDE MN ER 30 MG TAB: 30 | 30 days supply | Qty: 30 | Fill #0

## 2018-09-08 MED FILL — VIT D2 1.25 MG (50,000 UNIT: 1.25 MG | 84 days supply | Qty: 12 | Fill #0

## 2018-09-08 NOTE — Progress Notes (Signed)
[name]    Established Patient Office Visit  Subjective:  Patient ID: [name] [name], male    DOB: 1955/04/11  Age: 63 y.o. MRN: [id number]  CC:  Chief Complaint  Patient presents with  . Follow-up    chronic condition   . Foot Pain    possible gout    HPI [name] [name] is a 63 year old male who presents for follow up today.   Past Medical History:  Diagnosis Date  . Arthritis   . Coronary artery disease   . Depression 06/12/11   Buproprion per Dr. Lamonte Sakai  . Dyspnea   . Full dentures    Fitting Per Dr. Enrique Sack  . Heart murmur   . History of kidney stones   . Hypertension   . Hypothyroidism   . 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 Protein calorie malnutrition ([place]) 05/31/11  . Renal insufficiency 2012   Secondary to Hx. of Cisplatin and Dhydrtion  . Smoking 06/12/11   1/2 pack/day  . Status post chemotherapy 10/30/10 - 11/09/10    2 doses of Q 3 week Cisplatin  . Status post radiation therapy 10/11/10 - 12/24/10   Bilateral Neck and Mucosa axis /  70 gray in 35 fractions  . Xerostomia    Current Status: Since his last office visit, he continues to smoke 2 packs cigarettes a day. He contributes his increased smoking habit increased anxiety and grief. He recently loss his 'common-law-wife' of 31 years of Ovarian Cancer in 04/2018. He is having increased grieving and sadness. He is very tearful today. He denies suicidal ideations, homicidal ideations, or auditory hallucinations. He denies visual changes, chest pain, cough, shortness of breath, heart palpitations, and falls. He has occasional headaches and dizziness with position changes. Denies severe headaches, confusion, seizures, double vision, and blurred vision, nausea and vomiting.  He denies fevers, chills, fatigue, recent infections, weight loss, and night sweats. No reports of GI problems such as diarrhea, and constipation. He has no reports of blood in stools,  dysuria and hematuria. He continues to have generalized moderate arthritic pain today.   Past Surgical History:  Procedure Laterality Date  . APPENDECTOMY    . BIOPSY TONGUE     TonsilandBaseoftongue  . HERNIA REPAIR     umbilical with mesh  . IR NEPHROSTOMY EXCHANGE LEFT  01/10/2017  . IR NEPHROSTOMY PLACEMENT LEFT  11/15/2016  . LEFT HEART CATH AND CORONARY ANGIOGRAPHY N/A 01/18/2017   Procedure: LEFT HEART CATH AND CORONARY ANGIOGRAPHY;  Surgeon: [name], MD;  Location: [facility];  Service: Cardiovascular;  Laterality: N/A;  . NEPHROLITHOTOMY Left 02/11/2017   Procedure: NEPHROLITHOTOMY PERCUTANEOUS;  Surgeon: [name], MD;  Location: [facility];  Service: Urology;  Laterality: Left;  . PEG TUBE REMOVAL  04/13/11  . TONSILLECTOMY     right side    Family History  Problem Relation Age of Onset  . Cancer Father 59       colon  . Cancer Sister 43       leukemia    Social History   Socioeconomic History  . Marital status: Single    Spouse name: Not on file  . Number of children: Not on file  . Years of education: Not on file  . Highest education level: Not on file  Occupational History  . Not on file  Social Needs  . Financial resource strain: Not on file  .  Food insecurity    Worry: Not on file    Inability: Not on file  . Transportation needs    Medical: Not on file    Non-medical: Not on file  Tobacco Use  . Smoking status: Current Every Day Smoker    Packs/day: 0.25    Years: 25.00    Pack years: 6.25    Types: Cigarettes  . Smokeless tobacco: Never Used  . Tobacco comment: Smoking 4-5 cigarettes daily on average  Substance and Sexual Activity  . Alcohol use: No    Alcohol/week: 0.0 standard drinks  . Drug use: No  . Sexual activity: Not Currently  Lifestyle  . Physical activity    Days per week: Not on file    Minutes per session: Not on file  . Stress: Not on file  Relationships  . Social Herbalist on phone: Not on file     Gets together: Not on file    Attends religious service: Not on file    Active member of club or organization: Not on file    Attends meetings of clubs or organizations: Not on file    Relationship status: Not on file  . Intimate partner violence    Fear of current or ex partner: Not on file    Emotionally abused: Not on file    Physically abused: Not on file    Forced sexual activity: Not on file  Other Topics Concern  . Not on file  Social History Narrative  . Not on file    Outpatient Medications Prior to Visit  Medication Sig Dispense Refill  . albuterol (PROVENTIL HFA;VENTOLIN HFA) 108 (90 Base) MCG/ACT inhaler Inhale 2 puffs into the lungs every 4 (four) hours as needed for wheezing or shortness of breath (cough, shortness of breath or wheezing.). 1 Inhaler 12  . budesonide-formoterol (SYMBICORT) 80-4.5 MCG/ACT inhaler Inhale 2 puffs into the lungs 2 (two) times daily. 1 Inhaler 12  . amLODipine (NORVASC) 10 MG tablet Take 1 tablet (10 mg total) by mouth daily. 30 tablet 0  . atorvastatin (LIPITOR) 40 MG tablet Take 1 tablet (40 mg total) by mouth daily for 30 days. 30 tablet 0  . hydrALAZINE (APRESOLINE) 25 MG tablet Take 2 tablets (50 mg total) by mouth every 8 (eight) hours. 270 tablet 0  . isosorbide mononitrate (IMDUR) 30 MG 24 hr tablet Take 1 tablet (30 mg total) by mouth daily. 90 tablet 1  . levothyroxine (SYNTHROID) 137 MCG tablet Take 1 tablet (137 mcg total) by mouth daily before breakfast. 30 tablet 0  . metoprolol tartrate (LOPRESSOR) 25 MG tablet Take 1 tablet (25 mg total) by mouth 2 (two) times daily. 180 tablet 0  . Vitamin D, Ergocalciferol, (DRISDOL) 1.25 MG (50000 UT) CAPS capsule Take 1 capsule (50,000 Units total) by mouth every 7 (seven) days. 5 capsule 3   No facility-administered medications prior to visit.     Allergies  Allergen Reactions  . Codeine Nausea Only    ROS Review of Systems  Constitutional: Negative.   HENT: Negative.   Eyes:  Negative.   Respiratory: Negative.   Cardiovascular: Negative.   Gastrointestinal: Negative.   Endocrine: Negative.   Genitourinary: Negative.   Musculoskeletal: Positive for arthralgias (generalized arthritic pain ).  Skin: Negative.   Allergic/Immunologic: Negative.   Neurological: Positive for dizziness (occasional) and headaches (Occasional ).  Hematological: Negative.   Psychiatric/Behavioral: Negative.        Grieving, sad, and tearful today.  Objective:    Physical Exam  Constitutional: He is oriented to person, place, and time. He appears well-developed and well-nourished.  HENT:  Head: Normocephalic and atraumatic.  Eyes: Conjunctivae are normal.  Neck: Normal range of motion. Neck supple.  Cardiovascular: Normal rate, regular rhythm, normal heart sounds and intact distal pulses.  Pulmonary/Chest: Effort normal and breath sounds normal.  Abdominal: Soft. Bowel sounds are normal.  Musculoskeletal: Normal range of motion.  Neurological: He is alert and oriented to person, place, and time. He has normal reflexes.  Skin: Skin is warm and dry.  Psychiatric: He has a normal mood and affect. His behavior is normal. Judgment and thought content normal.  Nursing note and vitals reviewed.   BP 124/70 (BP Location: Left Arm, Patient Position: Sitting, Cuff Size: Small)   Pulse 66   Temp 98 F (36.7 C) (Oral)   Ht 5\' 9"  (1.753 m)   Wt 177 lb (80.3 kg)   SpO2 98%   BMI 26.14 kg/m  Wt Readings from Last 3 Encounters:  09/08/18 177 lb (80.3 kg)  03/10/18 188 lb (85.3 kg)  11/22/17 191 lb 9.6 oz (86.9 kg)     Health Maintenance Due  Topic Date Due  . COLONOSCOPY  12/24/2004    There are no preventive care reminders to display for this patient.  Lab Results  Component Value Date   TSH 1.240 05/08/2018   Lab Results  Component Value Date   WBC 10.3 03/10/2018   HGB 14.4 03/10/2018   HCT 44.5 03/10/2018   MCV 82 03/10/2018   PLT 246 03/10/2018   Lab Results   Component Value Date   NA 146 (H) 03/10/2018   K 4.4 03/10/2018   CO2 20 03/10/2018   GLUCOSE 96 03/10/2018   BUN 14 03/10/2018   CREATININE 1.57 (H) 03/10/2018   BILITOT 0.4 03/10/2018   ALKPHOS 110 03/10/2018   AST 17 03/10/2018   ALT 20 03/10/2018   PROT 6.6 03/10/2018   ALBUMIN 4.0 03/10/2018   CALCIUM 9.2 03/10/2018   ANIONGAP 7 02/03/2017   Lab Results  Component Value Date   CHOL 128 03/10/2018   Lab Results  Component Value Date   HDL 25 (L) 03/10/2018   Lab Results  Component Value Date   LDLCALC 71 03/10/2018   Lab Results  Component Value Date   TRIG 159 (H) 03/10/2018   Lab Results  Component Value Date   CHOLHDL 5.1 (H) 03/10/2018   Lab Results  Component Value Date   HGBA1C 5.6 11/22/2017    Assessment & Plan:   1. Essential hypertension The current medical regimen is effective; blood pressure is stable at 124/70 today; continue present plan and medications as prescribed. She will continue to decrease high sodium intake, excessive alcohol intake, increase potassium intake, smoking cessation, and increase physical activity of at least 30 minutes of cardio activity daily. She will continue to follow Heart Healthy or DASH diet. - metoprolol tartrate (LOPRESSOR) 25 MG tablet; Take 1 tablet (25 mg total) by mouth 2 (two) times daily.  Dispense: 60 tablet; Refill: 6 - isosorbide mononitrate (IMDUR) 30 MG 24 hr tablet; Take 1 tablet (30 mg total) by mouth daily.  Dispense: 30 tablet; Refill: 6 - hydrALAZINE (APRESOLINE) 25 MG tablet; Take 2 tablets (50 mg total) by mouth every 8 (eight) hours.  Dispense: 180 tablet; Refill: 6 - atorvastatin (LIPITOR) 40 MG tablet; Take 1 tablet (40 mg total) by mouth daily.  Dispense: 30 tablet; Refill: 6 -  amLODipine (NORVASC) 10 MG tablet; Take 1 tablet (10 mg total) by mouth daily.  Dispense: 30 tablet; Refill: 6  2. Grieving Patient referred to [facility] Grief Counselor today. We will continue to monitor.    3. Shortness of breath On exertion. Stable today. No signs or symptoms of respiratory distress noted or reported.   4. Cough Occasional r/t cigarette smoking. Monitor.   5. Tobacco use Continues 2 pack a day.   6. Encounter for smoking cessation counseling Benefits of quitting smoking discussed in detail today. He will contact office if addition assistance needed.   7. Vitamin D deficiency - Vitamin D, Ergocalciferol, (DRISDOL) 1.25 MG (50000 UT) CAPS capsule; Take 1 capsule (50,000 Units total) by mouth every 7 (seven) days.  Dispense: 5 capsule; Refill: 6  8. Hypothyroidism, unspecified type - levothyroxine (SYNTHROID) 137 MCG tablet; Take 1 tablet (137 mcg total) by mouth daily before breakfast.  Dispense: 30 tablet; Refill: 6  9. Follow up He will follow up in 6 months.  - POCT urinalysis dipstick  Meds ordered this encounter  Medications  . Vitamin D, Ergocalciferol, (DRISDOL) 1.25 MG (50000 UT) CAPS capsule    Sig: Take 1 capsule (50,000 Units total) by mouth every 7 (seven) days.    Dispense:  5 capsule    Refill:  6  . metoprolol tartrate (LOPRESSOR) 25 MG tablet    Sig: Take 1 tablet (25 mg total) by mouth 2 (two) times daily.    Dispense:  60 tablet    Refill:  6    Patient will pick up when needed  . levothyroxine (SYNTHROID) 137 MCG tablet    Sig: Take 1 tablet (137 mcg total) by mouth daily before breakfast.    Dispense:  30 tablet    Refill:  6  . isosorbide mononitrate (IMDUR) 30 MG 24 hr tablet    Sig: Take 1 tablet (30 mg total) by mouth daily.    Dispense:  30 tablet    Refill:  6  . hydrALAZINE (APRESOLINE) 25 MG tablet    Sig: Take 2 tablets (50 mg total) by mouth every 8 (eight) hours.    Dispense:  180 tablet    Refill:  6  . atorvastatin (LIPITOR) 40 MG tablet    Sig: Take 1 tablet (40 mg total) by mouth daily.    Dispense:  30 tablet    Refill:  6  . amLODipine (NORVASC) 10 MG tablet    Sig: Take 1 tablet (10 mg total) by mouth daily.     Dispense:  30 tablet    Refill:  6    Patient will pick up when needed    Orders Placed This Encounter  Procedures  . POCT urinalysis dipstick    Referral Orders  No referral(s) requested today    Kathe Becton,  MSN, FNP-BC [facility] [place], [place] [phone]   Problem List Items Addressed This Visit      Endocrine   Hypothyroidism   Relevant Medications   metoprolol tartrate (LOPRESSOR) 25 MG tablet   levothyroxine (SYNTHROID) 137 MCG tablet    Other Visit Diagnoses    Essential hypertension    -  Primary   Relevant Medications   metoprolol tartrate (LOPRESSOR) 25 MG tablet   isosorbide mononitrate (IMDUR) 30 MG 24 hr tablet   hydrALAZINE (APRESOLINE) 25 MG tablet   atorvastatin (LIPITOR) 40 MG tablet   amLODipine (NORVASC) 10 MG  tablet   Grieving       Shortness of breath       Cough       Tobacco use       Encounter for smoking cessation counseling       Vitamin D deficiency       Relevant Medications   Vitamin D, Ergocalciferol, (DRISDOL) 1.25 MG (50000 UT) CAPS capsule   Follow up       Relevant Orders   POCT urinalysis dipstick (Completed)      Meds ordered this encounter  Medications  . Vitamin D, Ergocalciferol, (DRISDOL) 1.25 MG (50000 UT) CAPS capsule    Sig: Take 1 capsule (50,000 Units total) by mouth every 7 (seven) days.    Dispense:  5 capsule    Refill:  6  . metoprolol tartrate (LOPRESSOR) 25 MG tablet    Sig: Take 1 tablet (25 mg total) by mouth 2 (two) times daily.    Dispense:  60 tablet    Refill:  6    Patient will pick up when needed  . levothyroxine (SYNTHROID) 137 MCG tablet    Sig: Take 1 tablet (137 mcg total) by mouth daily before breakfast.    Dispense:  30 tablet    Refill:  6  . isosorbide mononitrate (IMDUR) 30 MG 24 hr tablet    Sig: Take 1 tablet (30 mg total) by mouth daily.    Dispense:  30 tablet    Refill:  6  . hydrALAZINE (APRESOLINE) 25  MG tablet    Sig: Take 2 tablets (50 mg total) by mouth every 8 (eight) hours.    Dispense:  180 tablet    Refill:  6  . atorvastatin (LIPITOR) 40 MG tablet    Sig: Take 1 tablet (40 mg total) by mouth daily.    Dispense:  30 tablet    Refill:  6  . amLODipine (NORVASC) 10 MG tablet    Sig: Take 1 tablet (10 mg total) by mouth daily.    Dispense:  30 tablet    Refill:  6    Patient will pick up when needed    Follow-up: No follow-ups on file.    Azzie Glatter, FNP

## 2018-10-03 MED FILL — ISOSORBIDE MN ER 30 MG TAB: 30 | 30 days supply | Qty: 30 | Fill #1

## 2018-10-03 MED FILL — SYMBICORT 80-4.5 MCG INH: 80-4.5 | 30 days supply | Qty: 10 | Fill #2

## 2018-10-03 MED FILL — ALBUTEROL SULFATE HFA 108 (: 108 (90 BAS | 16 days supply | Qty: 18 | Fill #2

## 2018-10-03 MED FILL — ?METOPROLOL 25 MG TABLET: 25 | 30 days supply | Qty: 60 | Fill #1

## 2018-10-03 MED FILL — !SYMBICORT 80-4.5 MCG INH: 80-4.5 | 30 days supply | Qty: 1 | Fill #2

## 2018-10-03 MED FILL — hydrALAZINE HCL 25 MG TABS: 25 | 45 days supply | Qty: 270 | Fill #2

## 2018-10-03 MED FILL — ?LEVOTHYROXINE 137 MCG TAB: 137 | 30 days supply | Qty: 30 | Fill #0

## 2018-11-09 MED FILL — VIT D2 1.25 MG (50,000 UNIT: 1.25 MG | 84 days supply | Qty: 12 | Fill #1

## 2018-11-09 MED FILL — ?LEVOTHYROXINE 137 MCG TAB: 137 | 30 days supply | Qty: 30 | Fill #1

## 2018-11-09 MED FILL — METOPROLOL TARTRATE 25 MG T: 25 | 30 days supply | Qty: 60 | Fill #2

## 2018-11-09 MED FILL — SYMBICORT 80-4.5 MCG INH: 80-4.5 | 30 days supply | Qty: 10 | Fill #3

## 2018-11-09 MED FILL — ISOSORBIDE MN ER 30 MG TAB: 30 | 30 days supply | Qty: 30 | Fill #2

## 2018-12-14 MED FILL — hydrALAZINE HCL 25 MG TABS: 25 | 45 days supply | Qty: 270 | Fill #3

## 2018-12-14 MED FILL — METOPROLOL TARTRATE 25 MG T: 25 | 30 days supply | Qty: 60 | Fill #3

## 2018-12-14 MED FILL — ISOSORBIDE MN ER 30 MG TAB: 30 | 30 days supply | Qty: 30 | Fill #3

## 2018-12-14 MED FILL — LEVOTHYROXINE 137 MCG TAB: 137 | 30 days supply | Qty: 30 | Fill #2

## 2019-01-12 MED FILL — ISOSORBIDE MN ER 30 MG TAB: 30 | 30 days supply | Qty: 30 | Fill #4

## 2019-01-12 MED FILL — ?METOPROLOL 25 MG TABLET: 25 | 30 days supply | Qty: 60 | Fill #4

## 2019-01-12 MED FILL — VIT D2 1.25 MG (50,000 UNIT: 1.25 MG | 77 days supply | Qty: 11 | Fill #2

## 2019-01-12 MED FILL — ?LEVOTHYROXINE 137MCG TABLE: 137 | 30 days supply | Qty: 30 | Fill #3

## 2019-01-30 DIAGNOSIS — R21 Rash and other nonspecific skin eruption: Secondary | ICD-10-CM

## 2019-01-30 HISTORY — DX: Rash and other nonspecific skin eruption: R21

## 2019-01-31 ENCOUNTER — Other Ambulatory Visit: Payer: Self-pay

## 2019-01-31 ENCOUNTER — Encounter: Payer: Self-pay | Admitting: Family Medicine

## 2019-01-31 ENCOUNTER — Ambulatory Visit (INDEPENDENT_AMBULATORY_CARE_PROVIDER_SITE_OTHER): Payer: Self-pay | Admitting: Family Medicine

## 2019-01-31 VITALS — BP 149/85 | HR 96 | Temp 98.0°F | Ht 69.0 in | Wt 170.0 lb

## 2019-01-31 DIAGNOSIS — I10 Essential (primary) hypertension: Secondary | ICD-10-CM

## 2019-01-31 DIAGNOSIS — L299 Pruritus, unspecified: Secondary | ICD-10-CM

## 2019-01-31 DIAGNOSIS — R21 Rash and other nonspecific skin eruption: Secondary | ICD-10-CM

## 2019-01-31 DIAGNOSIS — R0602 Shortness of breath: Secondary | ICD-10-CM

## 2019-01-31 DIAGNOSIS — R059 Cough, unspecified: Secondary | ICD-10-CM

## 2019-01-31 DIAGNOSIS — Z720 Tobacco use: Secondary | ICD-10-CM

## 2019-01-31 DIAGNOSIS — H6591 Unspecified nonsuppurative otitis media, right ear: Secondary | ICD-10-CM

## 2019-01-31 DIAGNOSIS — Z09 Encounter for follow-up examination after completed treatment for conditions other than malignant neoplasm: Secondary | ICD-10-CM

## 2019-01-31 DIAGNOSIS — R05 Cough: Secondary | ICD-10-CM

## 2019-01-31 DIAGNOSIS — F4321 Adjustment disorder with depressed mood: Secondary | ICD-10-CM

## 2019-01-31 LAB — POCT URINALYSIS DIPSTICK
Glucose, UA: NEGATIVE
Ketones, UA: NEGATIVE
Leukocytes, UA: NEGATIVE
Nitrite, UA: NEGATIVE
Protein, UA: POSITIVE — AB
Spec Grav, UA: >=1.03 — AB (ref 1.010–1.025)
Urobilinogen, UA: 0.2 E.U./dL
pH, UA: 5.5 (ref 5.0–8.0)

## 2019-01-31 MED ORDER — HYDROCORTISONE 1 % EX CREA: 1.0000 "application " | TOPICAL_CREAM | Freq: Two times a day (BID) | CUTANEOUS | 6 refills | Status: DC

## 2019-01-31 MED ORDER — AMOXICILLIN-POT CLAVULANATE 875-125 MG PO TABS: 1.0000 | ORAL_TABLET | Freq: Two times a day (BID) | ORAL | 0 refills | Status: AC

## 2019-01-31 MED FILL — AMOX-CLAV 875-125 MG TABLET: 875-125 | 10 days supply | Qty: 20 | Fill #0

## 2019-01-31 NOTE — Progress Notes (Signed)
[facility]   Established Patient Office Visit  Subjective:  Patient ID: [name] [name], male    DOB: 07-01-1955  Age: 63 y.o. MRN: [id number]  CC:  Chief Complaint  Patient presents with  . Ear Pain    Right ear pain for one week    HPI [name] [name] is a 63 year old male who presents for Follow Up today.  Past Medical History:  Diagnosis Date  . Arthritis   . Coronary artery disease   . Depression 06/12/11   Buproprion per Dr. Lamonte Sakai  . Dyspnea   . Full dentures    Fitting Per Dr. Enrique Sack  . Heart murmur   . History of kidney stones   . Hypertension   . Hypothyroidism   . 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 Protein calorie malnutrition ([place]) 05/31/11  . Renal insufficiency 2012   Secondary to Hx. of Cisplatin and Dhydrtion  . Smoking 06/12/11   1/2 pack/day  . Status post chemotherapy 10/30/10 - 11/09/10    2 doses of Q 3 week Cisplatin  . Status post radiation therapy 10/11/10 - 12/24/10   Bilateral Neck and Mucosa axis /  70 gray in 35 fractions  . Xerostomia    Current Status: Since his last office visit, he has c/o right ear pain today.  He denies visual changes, chest pain, cough, shortness of breath, heart palpitations, and falls. He has occasional headaches and dizziness with position changes. Denies severe headaches, confusion, seizures, double vision, and blurred vision, nausea and vomiting. He denies fevers, chills, fatigue, recent infections, weight loss, and night sweats. No reports of GI problems such as nausea, vomiting, diarrhea, and constipation. He has no reports of blood in stools, dysuria and hematuria. His anxiety is mild today, r/t the recent death of his wife. He has family support. He denies suicidal ideations, homicidal ideations, or auditory hallucinations.   Past Surgical History:  Procedure Laterality Date  . APPENDECTOMY    . BIOPSY TONGUE     TonsilandBaseoftongue  . HERNIA REPAIR      umbilical with mesh  . IR NEPHROSTOMY EXCHANGE LEFT  01/10/2017  . IR NEPHROSTOMY PLACEMENT LEFT  11/15/2016  . LEFT HEART CATH AND CORONARY ANGIOGRAPHY N/A 01/18/2017   Procedure: LEFT HEART CATH AND CORONARY ANGIOGRAPHY;  Surgeon: [name], MD;  Location: [facility];  Service: Cardiovascular;  Laterality: N/A;  . NEPHROLITHOTOMY Left 02/11/2017   Procedure: NEPHROLITHOTOMY PERCUTANEOUS;  Surgeon: [name], MD;  Location: [facility];  Service: Urology;  Laterality: Left;  . PEG TUBE REMOVAL  04/13/11  . TONSILLECTOMY     right side    Family History  Problem Relation Age of Onset  . Cancer Father 21       colon  . Cancer Sister 64       leukemia    Social History   Socioeconomic History  . Marital status: Single    Spouse name: Not on file  . Number of children: Not on file  . Years of education: Not on file  . Highest education level: Not on file  Occupational History  . Not on file  Social Needs  . Financial resource strain: Not on file  . Food insecurity    Worry: Not on file    Inability: Not on file  . Transportation needs    Medical: Not on file    Non-medical: Not on file  Tobacco Use  . Smoking status: Current Every Day Smoker    Packs/day: 0.25    Years: 25.00    Pack years: 6.25    Types: Cigarettes  . Smokeless tobacco: Never Used  . Tobacco comment: Smoking 4-5 cigarettes daily on average  Substance and Sexual Activity  . Alcohol use: No    Alcohol/week: 0.0 standard drinks  . Drug use: No  . Sexual activity: Not Currently  Lifestyle  . Physical activity    Days per week: Not on file    Minutes per session: Not on file  . Stress: Not on file  Relationships  . Social Herbalist on phone: Not on file    Gets together: Not on file    Attends religious service: Not on file    Active member of club or organization: Not on file    Attends meetings of clubs or organizations: Not on file    Relationship status: Not on file   . Intimate partner violence    Fear of current or ex partner: Not on file    Emotionally abused: Not on file    Physically abused: Not on file    Forced sexual activity: Not on file  Other Topics Concern  . Not on file  Social History Narrative  . Not on file    Outpatient Medications Prior to Visit  Medication Sig Dispense Refill  . albuterol (PROVENTIL HFA;VENTOLIN HFA) 108 (90 Base) MCG/ACT inhaler Inhale 2 puffs into the lungs every 4 (four) hours as needed for wheezing or shortness of breath (cough, shortness of breath or wheezing.). 1 Inhaler 12  . amLODipine (NORVASC) 10 MG tablet Take 1 tablet (10 mg total) by mouth daily. 30 tablet 6  . atorvastatin (LIPITOR) 40 MG tablet Take 1 tablet (40 mg total) by mouth daily. 30 tablet 6  . budesonide-formoterol (SYMBICORT) 80-4.5 MCG/ACT inhaler Inhale 2 puffs into the lungs 2 (two) times daily. 1 Inhaler 12  . hydrALAZINE (APRESOLINE) 25 MG tablet Take 2 tablets (50 mg total) by mouth every 8 (eight) hours. 180 tablet 6  . isosorbide mononitrate (IMDUR) 30 MG 24 hr tablet Take 1 tablet (30 mg total) by mouth daily. 30 tablet 6  . levothyroxine (SYNTHROID) 137 MCG tablet Take 1 tablet (137 mcg total) by mouth daily before breakfast. 30 tablet 6  . metoprolol tartrate (LOPRESSOR) 25 MG tablet Take 1 tablet (25 mg total) by mouth 2 (two) times daily. 60 tablet 6  . Vitamin D, Ergocalciferol, (DRISDOL) 1.25 MG (50000 UT) CAPS capsule Take 1 capsule (50,000 Units total) by mouth every 7 (seven) days. 5 capsule 6   No facility-administered medications prior to visit.     Allergies  Allergen Reactions  . Codeine Nausea Only    ROS Review of Systems  Constitutional: Negative.   HENT: Positive for ear discharge and ear pain.   Eyes: Negative.   Respiratory: Negative.   Cardiovascular: Negative.   Gastrointestinal: Negative.   Endocrine: Negative.   Genitourinary: Negative.   Musculoskeletal: Negative.   Skin: Negative.    Allergic/Immunologic: Negative.   Neurological: Positive for dizziness (occasional ) and headaches (occasional).  Hematological: Negative.   Psychiatric/Behavioral: Negative.       Objective:    Physical Exam  Constitutional: He is oriented to person, place, and time. He appears well-developed and well-nourished.  HENT:  Head: Atraumatic.  Right ear erythema, pain/tenderness.   Eyes: Conjunctivae are normal.  Neck: Normal range of motion. Neck  supple.  Cardiovascular: Normal rate, regular rhythm, normal heart sounds and intact distal pulses.  Pulmonary/Chest: Effort normal and breath sounds normal.  Abdominal: Soft. Bowel sounds are normal.  Musculoskeletal: Normal range of motion.  Neurological: He is alert and oriented to person, place, and time. He has normal reflexes.  Skin: Skin is warm and dry.  Psychiatric: He has a normal mood and affect. His behavior is normal. Judgment and thought content normal.  Nursing note and vitals reviewed.   BP (!) 149/85   Pulse 96   Temp 98 F (36.7 C) (Oral)   Ht 5\' 9"  (1.753 m)   Wt 170 lb (77.1 kg)   SpO2 96%   BMI 25.10 kg/m  Wt Readings from Last 3 Encounters:  01/31/19 170 lb (77.1 kg)  09/08/18 177 lb (80.3 kg)  03/10/18 188 lb (85.3 kg)     Health Maintenance Due  Topic Date Due  . COLONOSCOPY  12/24/2004  . INFLUENZA VACCINE  09/30/2018    There are no preventive care reminders to display for this patient.  Lab Results  Component Value Date   TSH 1.240 05/08/2018   Lab Results  Component Value Date   WBC 10.3 03/10/2018   HGB 14.4 03/10/2018   HCT 44.5 03/10/2018   MCV 82 03/10/2018   PLT 246 03/10/2018   Lab Results  Component Value Date   NA 146 (H) 03/10/2018   K 4.4 03/10/2018   CO2 20 03/10/2018   GLUCOSE 96 03/10/2018   BUN 14 03/10/2018   CREATININE 1.57 (H) 03/10/2018   BILITOT 0.4 03/10/2018   ALKPHOS 110 03/10/2018   AST 17 03/10/2018   ALT 20 03/10/2018   PROT 6.6 03/10/2018   ALBUMIN  4.0 03/10/2018   CALCIUM 9.2 03/10/2018   ANIONGAP 7 02/03/2017   Lab Results  Component Value Date   CHOL 128 03/10/2018   Lab Results  Component Value Date   HDL 25 (L) 03/10/2018   Lab Results  Component Value Date   LDLCALC 71 03/10/2018   Lab Results  Component Value Date   TRIG 159 (H) 03/10/2018   Lab Results  Component Value Date   CHOLHDL 5.1 (H) 03/10/2018   Lab Results  Component Value Date   HGBA1C 5.6 11/22/2017      Assessment & Plan:    1. Essential hypertension He will continue to take medications as prescribed, to decrease high sodium intake, excessive alcohol intake, increase potassium intake, smoking cessation, and increase physical activity of at least 30 minutes of cardio activity daily. He will continue to follow Heart Healthy or DASH diet. - Urinalysis Dipstick  2. Right non-suppurative otitis media We will initiate antibiotic today.  - amoxicillin-clavulanate (AUGMENTIN) 875-125 MG tablet; Take 1 tablet by mouth 2 (two) times daily for 10 days.  Dispense: 20 tablet; Refill: 0  3. Skin rash We will initiate Hydrocortisone cream today.  - hydrocortisone cream 1 %; Apply 1 application topically 2 (two) times daily.  Dispense: 30 g; Refill: 6  4. Itching  5. Grieving  6. Shortness of breath Stable today. No signs or symptom of respiratory distress noted or reported.   7. Cough  8. Tobacco use  9. Follow up He will follow up in 6 months.   Meds ordered this encounter  Medications  . hydrocortisone cream 1 %    Sig: Apply 1 application topically 2 (two) times daily.    Dispense:  30 g    Refill:  6  .  amoxicillin-clavulanate (AUGMENTIN) 875-125 MG tablet    Sig: Take 1 tablet by mouth 2 (two) times daily for 10 days.    Dispense:  20 tablet    Refill:  0    Orders Placed This Encounter  Procedures  . Urinalysis Dipstick   Referral Orders  No referral(s) requested today    Kathe Becton,  MSN, FNP-BC [facility], [place] [address] [phone] fax  Problem List Items Addressed This Visit      Cardiovascular and Mediastinum   Essential hypertension - Primary   Relevant Orders   Urinalysis Dipstick (Completed)     Other   Cough   Grieving   Shortness of breath   Tobacco use    Other Visit Diagnoses    Right non-suppurative otitis media       Relevant Medications   amoxicillin-clavulanate (AUGMENTIN) 875-125 MG tablet   Skin rash       Relevant Medications   hydrocortisone cream 1 %   Itching       Follow up          Meds ordered this encounter  Medications  . hydrocortisone cream 1 %    Sig: Apply 1 application topically 2 (two) times daily.    Dispense:  30 g    Refill:  6  . amoxicillin-clavulanate (AUGMENTIN) 875-125 MG tablet    Sig: Take 1 tablet by mouth 2 (two) times daily for 10 days.    Dispense:  20 tablet    Refill:  0    Follow-up: No follow-ups on file.    Azzie Glatter, FNP

## 2019-01-31 NOTE — Patient Instructions (Signed)
Hydrocortisone skin cream, ointment, lotion, or solution What is this medicine? HYDROCORTISONE (hye droe KOR ti sone) is a corticosteroid. It is used on the skin to reduce swelling, redness, itching, and allergic reactions. This medicine may be used for other purposes; ask your health care provider or pharmacist if you have questions. COMMON BRAND NAME(S): Ala-Cort, Ala-Scalp, Anusol HC, Aqua Glycolic HC, Balneol for Her, Caldecort, Cetacort, Cortaid, Cortaid Advanced, Cortaid Intensive Therapy, Cortaid Sensitive Skin, CortAlo, Corticaine, Corticool, Cortizone, Cortizone-10, Cortizone-10 Cooling Relief, Cortizone-10 Intensive Healing, Cortizone-10 Plus, Dermarest Dricort, Dermarest Eczema, DERMASORB HC Complete, Gly-Cort, Hycort, Hydro Skin, Hydrocortisone in Absorbase, Hydroskin, Hytone, Instacort, Lacticare HC, Locoid, Locoid Lipocream, MiCort-HC, Monistat Complete Care Instant Itch Relief Cream, Neosporin Eczema, NuCort, Nutracort, NuZon, Pandel, Pediaderm HC, Penecort, Preparation H Hydrocortisone, Procto-Kit, Procto-Med HC, Procto-Pak, Proctocort, Proctocream-HC, Proctosol-HC, Proctozone-HC, Rederm, Sarnol-HC, Nurse, adult, Engineer, site, [place], Tucks HC, Vagisil Anti-Itch, [facility] Intensive Healing, Human resources officer What should I tell my health care provider before I take this medicine? They need to know if you have any of these conditions:  any active infection  large areas of burned or damaged skin  skin wasting or thinning  an unusual or allergic reaction to hydrocortisone, corticosteroids, sulfites, other medicines, foods, dyes, or preservatives  pregnant or trying to get pregnant  breast-feeding How should I use this medicine? This medicine is for external use only. Do not take by mouth. Follow the directions on the prescription label. Wash your hands before and after use. Apply a thin film of medicine to the affected area. Do not cover with a bandage or dressing unless your doctor or  health care professional tells you to. Do not use on healthy skin or over large areas of skin. Do not get this medicine in your eyes. If you do, rinse out with plenty of cool tap water. Do not to use more medicine than prescribed. Do not use your medicine more often than directed or for more than 14 days. Talk to your pediatrician regarding the use of this medicine in children. Special care may be needed. While this drug may be prescribed for children as young as 39 years of age for selected conditions, precautions do apply. Do not use this medicine for the treatment of diaper rash unless directed to do so by your doctor or health care professional. If applying this medicine to the diaper area of a child, do not cover with tight-fitting diapers or plastic pants. This may increase the amount of medicine that passes through the skin and increase the risk of serious side effects. Elderly patients are more likely to have damaged skin through aging, and this may increase side effects. This medicine should only be used for brief periods and infrequently in older patients. Overdosage: If you think you have taken too much of this medicine contact a poison control center or emergency room at once. NOTE: This medicine is only for you. Do not share this medicine with others. What if I miss a dose? If you miss a dose, use it as soon as you can. If it is almost time for your next dose, use only that dose. Do not use double or extra doses. What may interact with this medicine? Interactions are not expected. Do not use any other skin products on the affected area without asking your doctor or health care professional. This list may not describe all possible interactions. Give your health care provider a list of all the medicines, herbs, non-prescription drugs, or dietary supplements you use. Also tell them  if you smoke, drink alcohol, or use illegal drugs. Some items may interact with your medicine. What should I watch  for while using this medicine? Tell your doctor or health care professional if your symptoms do not start to get better within 7 days or if they get worse. Tell your doctor or health care professional if you are exposed to anyone with measles or chickenpox, or if you develop sores or blisters that do not heal properly. What side effects may I notice from receiving this medicine? Side effects that you should report to your doctor or health care professional as soon as possible:  allergic reactions like skin rash, itching or hives, swelling of the face, lips, or tongue  burning feeling on the skin  dark red spots on the skin  infection  lack of healing of skin condition  painful, red, pus filled blisters in hair follicles  thinning of the skin Side effects that usually do not require medical attention (report to your doctor or health care professional if they continue or are bothersome):  dry skin, irritation  unusual increased growth of hair on the face or body This list may not describe all possible side effects. Call your doctor for medical advice about side effects. You may report side effects to FDA at 1-800-FDA-1088. Where should I keep my medicine? Keep out of the reach of children. Store at room temperature between 15 and 30 degrees C (59 and 86 degrees F). Do not freeze. Throw away any unused medicine after the expiration date. NOTE: This sheet is a summary. It may not cover all possible information. If you have questions about this medicine, talk to your doctor, pharmacist, or health care provider.  2020 Elsevier/Gold Standard (2017-11-16 12:12:55) Rash, Adult  A rash is a change in the color of your skin. A rash can also change the way your skin feels. There are many different conditions and factors that can cause a rash. Follow these instructions at home: The goal of treatment is to stop the itching and keep the rash from spreading. Watch for any changes in your symptoms.  Let your doctor know about them. Follow these instructions to help with your condition: Medicine Take or apply over-the-counter and prescription medicines only as told by your doctor. These may include medicines:  To treat red or swollen skin (corticosteroid creams).  To treat itching.  To treat an allergy (oral antihistamines).  To treat very bad symptoms (oral corticosteroids).  Skin care  Put cool cloths (compresses) on the affected areas.  Do not scratch or rub your skin.  Avoid covering the rash. Make sure that the rash is exposed to air as much as possible. Managing itching and discomfort  Avoid hot showers or baths. These can make itching worse. A cold shower may help.  Try taking a bath with: ? Epsom salts. You can get these at your local pharmacy or grocery store. Follow the instructions on the package. ? Baking soda. Pour a small amount into the bath as told by your doctor. ? Colloidal oatmeal. You can get this at your local pharmacy or grocery store. Follow the instructions on the package.  Try putting baking soda paste onto your skin. Stir water into baking soda until it gets like a paste.  Try putting on a lotion that relieves itchiness (calamine lotion).  Keep cool and out of the sun. Sweating and being hot can make itching worse. General instructions   Rest as needed.  Drink enough fluid to  keep your pee (urine) pale yellow.  Wear loose-fitting clothing.  Avoid scented soaps, detergents, and perfumes. Use gentle soaps, detergents, perfumes, and other cosmetic products.  Avoid anything that causes your rash. Keep a journal to help track what causes your rash. Write down: ? What you eat. ? What cosmetic products you use. ? What you drink. ? What you wear. This includes jewelry.  Keep all follow-up visits as told by your doctor. This is important. Contact a doctor if:  You sweat at night.  You lose weight.  You pee (urinate) more than normal.   You pee less than normal, or you notice that your pee is a darker color than normal.  You feel weak.  You throw up (vomit).  Your skin or the whites of your eyes look yellow (jaundice).  Your skin: ? Tingles. ? Is numb.  Your rash: ? Does not go away after a few days. ? Gets worse.  You are: ? More thirsty than normal. ? More tired than normal.  You have: ? New symptoms. ? Pain in your belly (abdomen). ? A fever. ? Watery poop (diarrhea). Get help right away if:  You have a fever and your symptoms suddenly get worse.  You start to feel mixed up (confused).  You have a very bad headache or a stiff neck.  You have very bad joint pains or stiffness.  You have jerky movements that you cannot control (seizure).  Your rash covers all or most of your body. The rash may or may not be painful.  You have blisters that: ? Are on top of the rash. ? Grow larger. ? Grow together. ? Are painful. ? Are inside your nose or mouth.  You have a rash that: ? Looks like purple pinprick-sized spots all over your body. ? Has a "bull's eye" or looks like a target. ? Is red and painful, causes your skin to peel, and is not from being in the sun too long. Summary  A rash is a change in the color of your skin. A rash can also change the way your skin feels.  The goal of treatment is to stop the itching and keep the rash from spreading.  Take or apply over-the-counter and prescription medicines only as told by your doctor.  Contact a doctor if you have new symptoms or symptoms that get worse.  Keep all follow-up visits as told by your doctor. This is important. This information is not intended to replace advice given to you by your health care provider. Make sure you discuss any questions you have with your health care provider. Document Released: 08/04/2007 Document Revised: 06/09/2018 Document Reviewed: 09/19/2017 Elsevier Patient Education  [place]. Amoxicillin;  Clavulanic Acid tablets What is this medicine? AMOXICILLIN; CLAVULANIC ACID (a mox i SIL in; KLAV yoo lan ic AS id) is a penicillin antibiotic. It is used to treat certain kinds of bacterial infections. It will not work for colds, flu, or other viral infections. This medicine may be used for other purposes; ask your health care provider or pharmacist if you have questions. COMMON BRAND NAME(S): Augmentin What should I tell my health care provider before I take this medicine? They need to know if you have any of these conditions:  bowel disease, like colitis  kidney disease  liver disease  mononucleosis  an unusual or allergic reaction to amoxicillin, penicillin, cephalosporin, other antibiotics, clavulanic acid, other medicines, foods, dyes, or preservatives  pregnant or trying to get pregnant  breast-feeding How should I use this medicine? Take this medicine by mouth with a full glass of water. Follow the directions on the prescription label. Take at the start of a meal. Do not crush or chew. If the tablet has a score line, you may cut it in half at the score line for easier swallowing. Take your medicine at regular intervals. Do not take your medicine more often than directed. Take all of your medicine as directed even if you think you are better. Do not skip doses or stop your medicine early. Talk to your pediatrician regarding the use of this medicine in children. Special care may be needed. Overdosage: If you think you have taken too much of this medicine contact a poison control center or emergency room at once. NOTE: This medicine is only for you. Do not share this medicine with others. What if I miss a dose? If you miss a dose, take it as soon as you can. If it is almost time for your next dose, take only that dose. Do not take double or extra doses. What may interact with this medicine?  allopurinol  anticoagulants  birth control pills  methotrexate  probenecid This  list may not describe all possible interactions. Give your health care provider a list of all the medicines, herbs, non-prescription drugs, or dietary supplements you use. Also tell them if you smoke, drink alcohol, or use illegal drugs. Some items may interact with your medicine. What should I watch for while using this medicine? Tell your doctor or healthcare provider if your symptoms do not improve. This medicine may cause serious skin reactions. They can happen weeks to months after starting the medicine. Contact your healthcare provider right away if you notice fevers or flu-like symptoms with a rash. The rash may be red or purple and then turn into blisters or peeling of the skin. Or, you might notice a red rash with swelling of the face, lips or lymph nodes in your neck or under your arms. Do not treat diarrhea with over the counter products. Contact your doctor if you have diarrhea that lasts more than 2 days or if it is severe and watery. If you have diabetes, you may get a false-positive result for sugar in your urine. Check with your doctor or healthcare provider. Birth control pills may not work properly while you are taking this medicine. Talk to your doctor about using an extra method of birth control. What side effects may I notice from receiving this medicine? Side effects that you should report to your doctor or health care professional as soon as possible:  allergic reactions like skin rash, itching or hives, swelling of the face, lips, or tongue  breathing problems  dark urine  fever or chills, sore throat  redness, blistering, peeling, or loosening of the skin, including inside the mouth  seizures  trouble passing urine or change in the amount of urine  unusual bleeding, bruising  unusually weak or tired  white patches or sores in the mouth or throat Side effects that usually do not require medical attention (report to your doctor or health care professional if they  continue or are bothersome):  diarrhea  dizziness  headache  nausea, vomiting  stomach upset  vaginal or anal irritation This list may not describe all possible side effects. Call your doctor for medical advice about side effects. You may report side effects to FDA at 1-800-FDA-1088. Where should I keep my medicine? Keep out of the reach  of children. Store at room temperature below 25 degrees C (77 degrees F). Keep container tightly closed. Throw away any unused medicine after the expiration date. NOTE: This sheet is a summary. It may not cover all possible information. If you have questions about this medicine, talk to your doctor, pharmacist, or health care provider.  2020 Elsevier/Gold Standard (2018-05-01 09:43:46) Otitis Media, Adult  Otitis media means that the middle ear is red and swollen (inflamed) and full of fluid. The condition usually goes away on its own. Follow these instructions at home:  Take over-the-counter and prescription medicines only as told by your doctor.  If you were prescribed an antibiotic medicine, take it as told by your doctor. Do not stop taking the antibiotic even if you start to feel better.  Keep all follow-up visits as told by your doctor. This is important. Contact a doctor if:  You have bleeding from your nose.  There is a lump on your neck.  You are not getting better in 5 days.  You feel worse instead of better. Get help right away if:  You have pain that is not helped with medicine.  You have swelling, redness, or pain around your ear.  You get a stiff neck.  You cannot move part of your face (paralyzed).  You notice that the bone behind your ear hurts when you touch it.  You get a very bad headache. Summary  Otitis media means that the middle ear is red, swollen, and full of fluid.  This condition usually goes away on its own. In some cases, treatment may be needed.  If you were prescribed an antibiotic medicine,  take it as told by your doctor. This information is not intended to replace advice given to you by your health care provider. Make sure you discuss any questions you have with your health care provider. Document Released: 08/04/2007 Document Revised: 01/28/2017 Document Reviewed: 03/08/2016 Elsevier Patient Education  2020 [employer].

## 2019-02-01 ENCOUNTER — Encounter: Payer: Self-pay | Admitting: Family Medicine

## 2019-02-01 DIAGNOSIS — L299 Pruritus, unspecified: Secondary | ICD-10-CM | POA: Insufficient documentation

## 2019-02-01 DIAGNOSIS — R21 Rash and other nonspecific skin eruption: Secondary | ICD-10-CM | POA: Insufficient documentation

## 2019-02-01 DIAGNOSIS — H6591 Unspecified nonsuppurative otitis media, right ear: Secondary | ICD-10-CM | POA: Insufficient documentation

## 2019-02-12 MED FILL — hydrALAZINE HCL 25 MG TABS: 25 | 45 days supply | Qty: 270 | Fill #4

## 2019-02-12 MED FILL — ?LEVOTHYROXINE 137MCG TABLE: 137 | 30 days supply | Qty: 30 | Fill #4

## 2019-02-12 MED FILL — ISOSORBIDE MN ER 30 MG TAB: 30 | 30 days supply | Qty: 30 | Fill #5

## 2019-02-21 ENCOUNTER — Telehealth: Payer: Self-pay | Admitting: Family Medicine

## 2019-02-21 NOTE — Telephone Encounter (Signed)
Patient contacted office with concerns. Attempted to contact patient today. Hydrocortisone cream sent to pharmacy on 01/31/2019. Message left for patient to contact office and reports symptoms.

## 2019-03-12 ENCOUNTER — Ambulatory Visit: Payer: Self-pay | Admitting: Family Medicine

## 2019-03-21 MED FILL — ISOSORBIDE MN ER 30 MG TAB: 30 | 30 days supply | Qty: 30 | Fill #6

## 2019-03-21 MED FILL — ?METOPROLOL 25 MG TABLET: 25 | 30 days supply | Qty: 60 | Fill #5

## 2019-03-21 MED FILL — LEVOTHYROXINE 137 MCG TAB: 137 | 30 days supply | Qty: 30 | Fill #5

## 2019-04-12 ENCOUNTER — Telehealth: Payer: Self-pay | Admitting: Family Medicine

## 2019-04-12 ENCOUNTER — Other Ambulatory Visit: Payer: Self-pay

## 2019-04-12 DIAGNOSIS — R0602 Shortness of breath: Secondary | ICD-10-CM

## 2019-04-12 DIAGNOSIS — I10 Essential (primary) hypertension: Secondary | ICD-10-CM

## 2019-04-12 DIAGNOSIS — E039 Hypothyroidism, unspecified: Secondary | ICD-10-CM

## 2019-04-12 DIAGNOSIS — E559 Vitamin D deficiency, unspecified: Secondary | ICD-10-CM

## 2019-04-12 MED ORDER — ATORVASTATIN CALCIUM 40 MG PO TABS: 40.0000 mg | ORAL_TABLET | Freq: Every day | ORAL | 6 refills | Status: DC

## 2019-04-12 MED ORDER — VITAMIN D (ERGOCALCIFEROL) 1.25 MG (50000 UNIT) PO CAPS: 50000.0000 [IU] | ORAL_CAPSULE | ORAL | 6 refills | Status: DC

## 2019-04-12 MED ORDER — HYDRALAZINE HCL 25 MG PO TABS: 50.0000 mg | ORAL_TABLET | Freq: Three times a day (TID) | ORAL | 6 refills | Status: DC

## 2019-04-12 MED ORDER — AMLODIPINE BESYLATE 10 MG PO TABS: 10.0000 mg | ORAL_TABLET | Freq: Every day | ORAL | 6 refills | Status: DC

## 2019-04-12 MED ORDER — METOPROLOL TARTRATE 25 MG PO TABS: 25.0000 mg | ORAL_TABLET | Freq: Two times a day (BID) | ORAL | 6 refills | Status: DC

## 2019-04-12 MED ORDER — LEVOTHYROXINE SODIUM 137 MCG PO TABS: 137.0000 ug | ORAL_TABLET | Freq: Every day | ORAL | 6 refills | Status: DC

## 2019-04-12 MED ORDER — ISOSORBIDE MONONITRATE ER 30 MG PO TB24: 30.0000 mg | ORAL_TABLET | Freq: Every day | ORAL | 6 refills | Status: DC

## 2019-04-12 MED FILL — ?AMLODIPINE BESYL 10MG TABL: 10 | 30 days supply | Qty: 30 | Fill #0

## 2019-04-12 MED FILL — ISOSORBIDE MN ER 30 MG TAB: 30 | 30 days supply | Qty: 30 | Fill #0

## 2019-04-12 MED FILL — hydrALAZINE HCL 25 MG TABS: 25 | 30 days supply | Qty: 180 | Fill #0

## 2019-04-12 MED FILL — ATORVASTATIN CALCIUM 40 MG: 40 | 30 days supply | Qty: 30 | Fill #0

## 2019-04-12 MED FILL — ?ERGOCALCIFEROL 50000 UNITC: 1.25 MG | 35 days supply | Qty: 5 | Fill #0

## 2019-04-12 MED FILL — LEVOTHYROXINE 137 MCG TAB: 137 | 30 days supply | Qty: 30 | Fill #0

## 2019-04-12 NOTE — Telephone Encounter (Signed)
done

## 2019-05-07 MED FILL — ?LEVOTHYROXINE 137 MCG TAB: 137 | 30 days supply | Qty: 30 | Fill #1

## 2019-05-07 MED FILL — ?AMLODIPINE BESYL 10MG TABL: 10 | 30 days supply | Qty: 30 | Fill #1

## 2019-05-07 MED FILL — ISOSORBIDE MN ER 30 MG TAB: 30 | 30 days supply | Qty: 30 | Fill #1

## 2019-05-07 MED FILL — ?ATORVASTATIN 40MG TABL: 40 | 30 days supply | Qty: 30 | Fill #1

## 2019-05-07 MED FILL — METOPROLOL TARTRATE 25 MG T: 25 | 30 days supply | Qty: 60 | Fill #6

## 2019-05-18 MED FILL — hydrALAZINE HCL 25 MG TABS: 25 | 30 days supply | Qty: 180 | Fill #1

## 2019-06-08 MED FILL — ?ERGOCALCIFEROL 50000 UNITC: 1.25 MG | 35 days supply | Qty: 5 | Fill #1

## 2019-06-08 MED FILL — ?LEVOTHYROXINE 137 MCG TAB: 137 | 30 days supply | Qty: 30 | Fill #2

## 2019-06-08 MED FILL — ?ATORVASTATIN 40MG TABLET: 40 | 30 days supply | Qty: 30 | Fill #2

## 2019-06-08 MED FILL — ISOSORBIDE MN ER 30 MG TAB: 30 | 30 days supply | Qty: 30 | Fill #2

## 2019-06-08 MED FILL — AMLODIPINE BESYLATE 10 MG T: 10 | 30 days supply | Qty: 30 | Fill #2

## 2019-06-19 MED FILL — AMLODIPINE BESYLATE 10 MG T: 10 | 30 days supply | Qty: 30 | Fill #3

## 2019-06-20 MED FILL — ?METOPROLOL TART 25MG TABLE: 25 | 30 days supply | Qty: 60 | Fill #0

## 2019-06-21 NOTE — Progress Notes (Deleted)
Cardiology Office Note   Date:  06/21/2019   ID:  [name] [name], DOB [birth year], MRN [id number]  PCP:  Azzie Glatter, FNP  Cardiologist:  Shelva Majestic, MD EP: None  No chief complaint on file.     History of Present Illness: [name] [name] is a 64 y.o. male with PMH of CAD with subtotal occlusion of LAD with collaterals from the 1st diagonal and 85% distal RCA disease (medically managed), HTN, HLD, throat cancer s/p chemo/XRT 2012, CKD stage 3 with nephrolithiasis, tobacco abuse, who presents for ***  His last echo was 11/2016 and showed EF 55-60%, no RWMA, normal LV diastolic function, mild concentric hypertrophy, and mild MR. He had a NST 12/2016 which showed EF 49% and a medium defect of moderate severity in the mid anteroseptal, apical anterior, apical septal, and apex location c/w prior anterior apical I withe peri-infarct ischemia. He subsequently underwent a LHC 12/2016 which showed a subtotal occlusion of the LAD with good collateralization from the 1st diagonal with 85% distal RCA disease which was medically managed given lack of anginal symptoms with consideration to undergo PCA to RCA at a later time given need for surgery to managed his renal stone.   He was last seen by cardiology at an outpatient visit with Dr. Claiborne Billings 04/2017 at which time he was doing well from a cardiac standpoint. He had no anginal complaints and had cut back on his cigarette use from 3 ppd to 1 ppd. His metoprolol was increased to 25mg  BID and atorvastatin increased to 40mg  daily, in addition to continuing isosorbide, hydralazine, and amlodipine for medical management. He was recommended to follow-up in 4 months, however he has not been seen since that time.  He presents today for routine follow-up.   1. CAD:  - Continue aspirin and statin - Continue imdur and hydralazine - Continue metoprolol and amlodipine  2. HTN: BP *** today - Continue hydralazine, metoprolol, amlodipine, and imdur  3.  HLD: LDL 71 03/2018 - Continue atorvastatin  4. CKD stage 3: Cr 1.57 03/2018 - Continue to monitor  Past Medical History:  Diagnosis Date  . Arthritis   . Coronary artery disease   . Depression 06/12/11   Buproprion per Dr. Lamonte Sakai  . Dyspnea   . Full dentures    Fitting Per Dr. Enrique Sack  . Heart murmur   . History of kidney stones   . Hypertension   . Hypothyroidism   . 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 Protein calorie malnutrition ([place]) 05/31/11  . Renal insufficiency 2012   Secondary to Hx. of Cisplatin and Dhydrtion  . Skin rash 01/2019  . Smoking 06/12/11   1/2 pack/day  . Status post chemotherapy 10/30/10 - 11/09/10    2 doses of Q 3 week Cisplatin  . Status post radiation therapy 10/11/10 - 12/24/10   Bilateral Neck and Mucosa axis /  70 gray in 35 fractions  . Xerostomia     Past Surgical History:  Procedure Laterality Date  . APPENDECTOMY    . BIOPSY TONGUE     TonsilandBaseoftongue  . HERNIA REPAIR     umbilical with mesh  . IR NEPHROSTOMY EXCHANGE LEFT  01/10/2017  . IR NEPHROSTOMY PLACEMENT LEFT  11/15/2016  . LEFT HEART CATH AND CORONARY ANGIOGRAPHY N/A 01/18/2017   Procedure: LEFT HEART CATH AND CORONARY ANGIOGRAPHY;  Surgeon: [name], MD;  Location: [facility];  Service: Cardiovascular;  Laterality: N/A;  . NEPHROLITHOTOMY  Left 02/11/2017   Procedure: NEPHROLITHOTOMY PERCUTANEOUS;  Surgeon: [name], MD;  Location: [facility];  Service: Urology;  Laterality: Left;  . PEG TUBE REMOVAL  04/13/11  . TONSILLECTOMY     right side     Current Outpatient Medications  Medication Sig Dispense Refill  . albuterol (PROVENTIL HFA;VENTOLIN HFA) 108 (90 Base) MCG/ACT inhaler Inhale 2 puffs into the lungs every 4 (four) hours as needed for wheezing or shortness of breath (cough, shortness of breath or wheezing.). 1 Inhaler 12  . amLODipine (NORVASC) 10 MG tablet Take 1 tablet (10 mg total) by mouth daily. 30 tablet 6  . atorvastatin (LIPITOR) 40 MG  tablet Take 1 tablet (40 mg total) by mouth daily. 30 tablet 6  . budesonide-formoterol (SYMBICORT) 80-4.5 MCG/ACT inhaler Inhale 2 puffs into the lungs 2 (two) times daily. 1 Inhaler 12  . hydrALAZINE (APRESOLINE) 25 MG tablet Take 2 tablets (50 mg total) by mouth every 8 (eight) hours. 180 tablet 6  . hydrocortisone cream 1 % Apply 1 application topically 2 (two) times daily. 30 g 6  . isosorbide mononitrate (IMDUR) 30 MG 24 hr tablet Take 1 tablet (30 mg total) by mouth daily. 30 tablet 6  . levothyroxine (SYNTHROID) 137 MCG tablet Take 1 tablet (137 mcg total) by mouth daily before breakfast. 30 tablet 6  . metoprolol tartrate (LOPRESSOR) 25 MG tablet Take 1 tablet (25 mg total) by mouth 2 (two) times daily. 60 tablet 6  . Vitamin D, Ergocalciferol, (DRISDOL) 1.25 MG (50000 UNIT) CAPS capsule Take 1 capsule (50,000 Units total) by mouth every 7 (seven) days. 5 capsule 6   No current facility-administered medications for this visit.    Allergies:   Codeine    Social History:  The patient  reports that he has been smoking cigarettes. He has a 6.25 pack-year smoking history. He has never used smokeless tobacco. He reports that he does not drink alcohol or use drugs.   Family History:  The patient's ***family history includes Cancer (age of onset: 70) in his sister; Cancer (age of onset: 39) in his father.    ROS:  Please see the history of present illness.   Otherwise, review of systems are positive for {NONE DEFAULTED:18576::"none"}.   All other systems are reviewed and negative.    PHYSICAL EXAM: VS:  There were no vitals taken for this visit. , BMI There is no height or weight on file to calculate BMI. GEN: Well nourished, well developed, in no acute distress HEENT: normal Neck: no JVD, carotid bruits, or masses Cardiac: ***RRR; no murmurs, rubs, or gallops,no edema  Respiratory:  clear to auscultation bilaterally, normal work of breathing GI: soft, nontender, nondistended, +  BS MS: no deformity or atrophy Skin: warm and dry, no rash Neuro:  Strength and sensation are intact Psych: euthymic mood, full affect   EKG:  EKG {ACTION; IS/IS UEA:[id number]} ordered today. The ekg ordered today demonstrates ***   Recent Labs: No results found for requested labs within last 8760 hours.    Lipid Panel    Component Value Date/Time   CHOL 128 03/10/2018 1501   TRIG 159 (H) 03/10/2018 1501   HDL 25 (L) 03/10/2018 1501   CHOLHDL 5.1 (H) 03/10/2018 1501   CHOLHDL 6.2 (H) 01/03/2017 1108   VLDL 36 (H) 07/18/2015 1005   LDLCALC 71 03/10/2018 1501   LDLCALC 120 (H) 01/03/2017 1108      Wt Readings from Last 3 Encounters:  01/31/19 170 lb (77.1 kg)  09/08/18 177 lb (80.3 kg)  03/10/18 188 lb (85.3 kg)      Other studies Reviewed: Additional studies/ records that were reviewed today include:   Left heart catheterization 12/2016:  Post Atrio lesion is 85% stenosed.  Ost 1st Mrg lesion is 40% stenosed.  1st Mrg lesion is 40% stenosed.  Ost 2nd Mrg to 2nd Mrg lesion is 30% stenosed.  Prox LAD to Mid LAD lesion is 95% stenosed.  Mid LAD to Dist LAD lesion is 90% stenosed.  The left ventricular systolic function is normal.  LV end diastolic pressure is normal.  The left ventricular ejection fraction is 55-65% by visual estimate.   Preserved global LV contractility with ejection fraction of 55-60% with very subtle mid anterolateral and mid inferior hypocontractility.  Multivessel CAD with long subtotal LAD stenosis after the first diagonal and septal perforating artery with significant collateralization arising from the diagonal vessel to the apical LAD with retrograde filling of the LAD distally to proximally and evidence for an additional diffuse 90% distal LAD stenosis; 40% ostial proximal OM1 stenosis, 30% OM 2 stenosis of the left circumflex coronary artery; and large dominant RCA with a focal 85% continuation branch stenosis after the PDA vessel.    RECOMMENDATION: Mr. Waylan Busta has not had any chest pain.  His ECG reveals anterolateral T wave abnormalities consistent with his subtotal LAD stenosis, which is collateralized extensively in a retrograde fashion from a large first diagonal vessel.  On his most recent nuclear study there was no ischemia in the inferior wall; however, he has a focal 85% continuation branch stenosis of his RCA after the PDA vessel and there are 2 small inferior LV branches and a posterior lateral vessel which arises distally beyond this stenosis.  Initially, the patient will be treated medically and I have contacted Dr. Karsten Ro.  So as to avoid preoperative dual antiplatelet therapy plans can be made for his urologic surgery to remove his renal stone with plans for delayed PCI to his RCA postoperatively, when long-term antiplatelet therapy can be administered.  NST 12/2016:  Nuclear stress EF: 49%. The left ventricular ejection fraction is mildly decreased (45-54%).  Defect 1: There is a medium defect of moderate severity present in the mid anteroseptal, apical anterior, apical septal and apex location.  Findings consistent prior anterior apical myocardial infarction with peri-infarct ischemia.  This is a high risk study.  Echocardiogram 11/2016: Study Conclusions   - Left ventricle: The cavity size was normal. There was mild  concentric hypertrophy. Systolic function was normal. The  estimated ejection fraction was in the range of 55% to 60%. Wall  motion was normal; there were no regional wall motion  abnormalities. Left ventricular diastolic function parameters  were normal.  - Aortic valve: Transvalvular velocity was within the normal range.  There was no stenosis. There was no regurgitation.  - Mitral valve: Transvalvular velocity was within the normal range.  There was no evidence for stenosis. There was mild regurgitation.  - Left atrium: The atrium was mildly dilated.  - Right  ventricle: The cavity size was normal. Wall thickness was  normal. Systolic function was normal.  - Atrial septum: No defect or patent foramen ovale was identified  by color flow Doppler.  - Tricuspid valve: There was no regurgitation.   ASSESSMENT AND PLAN:  1.  ***   Current medicines are reviewed at length with the patient today.  The patient {ACTIONS; HAS/DOES NOT HAVE:19233} concerns regarding medicines.  The following changes have  been made:  {PLAN; NO CHANGE:13088:s}  Labs/ tests ordered today include: *** No orders of the defined types were placed in this encounter.    Disposition:   FU with *** in {gen number 9-81:025486} {Days to years:10300}  Signed, Abigail Butts, PA-C  06/21/2019 2:35 PM

## 2019-06-22 MED FILL — hydrALAZINE HCL 25 MG TABS: 25 | 30 days supply | Qty: 180 | Fill #2

## 2019-06-25 ENCOUNTER — Ambulatory Visit: Payer: Self-pay | Admitting: Medical

## 2019-07-18 MED FILL — ISOSORBIDE MN ER 30 MG TAB: 30 | 30 days supply | Qty: 30 | Fill #3

## 2019-07-18 MED FILL — ?ERGOCALCIFEROL 50000 UNITC: 1.25 MG | 35 days supply | Qty: 5 | Fill #2

## 2019-07-18 MED FILL — ?ATORVASTATIN 40MG TABLET: 40 | 30 days supply | Qty: 30 | Fill #3

## 2019-07-18 MED FILL — ?LEVOTHYROXINE 137 MCG TAB: 137 | 30 days supply | Qty: 30 | Fill #3

## 2019-07-18 MED FILL — ?METOPROLOL TART 25MG TABLE: 25 | 30 days supply | Qty: 60 | Fill #1

## 2019-07-31 DIAGNOSIS — G8929 Other chronic pain: Secondary | ICD-10-CM

## 2019-07-31 DIAGNOSIS — H9201 Otalgia, right ear: Secondary | ICD-10-CM

## 2019-07-31 DIAGNOSIS — H9211 Otorrhea, right ear: Secondary | ICD-10-CM

## 2019-07-31 HISTORY — DX: Otorrhea, right ear: H92.11

## 2019-07-31 HISTORY — DX: Otalgia, right ear: H92.01

## 2019-07-31 HISTORY — DX: Other chronic pain: G89.29

## 2019-07-31 MED FILL — hydrALAZINE HCL 25 MG TABS: 25 | 30 days supply | Qty: 180 | Fill #3

## 2019-08-01 ENCOUNTER — Other Ambulatory Visit: Payer: Self-pay

## 2019-08-01 ENCOUNTER — Ambulatory Visit (INDEPENDENT_AMBULATORY_CARE_PROVIDER_SITE_OTHER): Payer: Self-pay | Admitting: Family Medicine

## 2019-08-01 ENCOUNTER — Encounter: Payer: Self-pay | Admitting: Family Medicine

## 2019-08-01 VITALS — BP 101/62 | HR 60 | Temp 97.9°F | Ht 69.0 in | Wt 168.6 lb

## 2019-08-01 DIAGNOSIS — R0602 Shortness of breath: Secondary | ICD-10-CM

## 2019-08-01 DIAGNOSIS — I10 Essential (primary) hypertension: Secondary | ICD-10-CM

## 2019-08-01 DIAGNOSIS — R05 Cough: Secondary | ICD-10-CM

## 2019-08-01 DIAGNOSIS — R059 Cough, unspecified: Secondary | ICD-10-CM

## 2019-08-01 DIAGNOSIS — H6591 Unspecified nonsuppurative otitis media, right ear: Secondary | ICD-10-CM

## 2019-08-01 DIAGNOSIS — H9211 Otorrhea, right ear: Secondary | ICD-10-CM | POA: Insufficient documentation

## 2019-08-01 DIAGNOSIS — Z09 Encounter for follow-up examination after completed treatment for conditions other than malignant neoplasm: Secondary | ICD-10-CM

## 2019-08-01 DIAGNOSIS — C109 Malignant neoplasm of oropharynx, unspecified: Secondary | ICD-10-CM

## 2019-08-01 LAB — POCT URINALYSIS DIPSTICK
Bilirubin, UA: NEGATIVE
Blood, UA: NEGATIVE
Glucose, UA: NEGATIVE
Ketones, UA: NEGATIVE
Leukocytes, UA: NEGATIVE
Nitrite, UA: NEGATIVE
Protein, UA: NEGATIVE
Spec Grav, UA: 1.02 (ref 1.010–1.025)
Urobilinogen, UA: 0.2 E.U./dL
pH, UA: 5 (ref 5.0–8.0)

## 2019-08-01 MED ORDER — AMOXICILLIN-POT CLAVULANATE 875-125 MG PO TABS: 1.0000 | ORAL_TABLET | Freq: Two times a day (BID) | ORAL | 0 refills | Status: AC

## 2019-08-01 MED FILL — AMOX-CLAV 875-125 MG TABLET: 875-125 | 10 days supply | Qty: 20 | Fill #0

## 2019-08-01 NOTE — Progress Notes (Signed)
Virtual Visit via Telephone Note  I connected with [name] [name] on 08/01/19 at 10:00 AM EDT by telephone and verified that I am speaking with the correct person using two identifiers.   I discussed the limitations, risks, security and privacy concerns of performing an evaluation and management service by telephone and the availability of in person appointments. I also discussed with the patient that there may be a patient responsible charge related to this service. The patient expressed understanding and agreed to proceed.   History of Present Illness:  Past Surgical History:  Procedure Laterality Date  . APPENDECTOMY    . BIOPSY TONGUE     TonsilandBaseoftongue  . HERNIA REPAIR     umbilical with mesh  . IR NEPHROSTOMY EXCHANGE LEFT  01/10/2017  . IR NEPHROSTOMY PLACEMENT LEFT  11/15/2016  . LEFT HEART CATH AND CORONARY ANGIOGRAPHY N/A 01/18/2017   Procedure: LEFT HEART CATH AND CORONARY ANGIOGRAPHY;  Surgeon: [name], MD;  Location: [facility];  Service: Cardiovascular;  Laterality: N/A;  . NEPHROLITHOTOMY Left 02/11/2017   Procedure: NEPHROLITHOTOMY PERCUTANEOUS;  Surgeon: [name], MD;  Location: [facility];  Service: Urology;  Laterality: Left;  . PEG TUBE REMOVAL  04/13/11  . TONSILLECTOMY     right side   Social History   Socioeconomic History  . Marital status: Single    Spouse name: Not on file  . Number of children: Not on file  . Years of education: Not on file  . Highest education level: Not on file  Occupational History  . Not on file  Tobacco Use  . Smoking status: Current Every Day Smoker    Packs/day: 0.25    Years: 25.00    Pack years: 6.25    Types: Cigarettes  . Smokeless tobacco: Never Used  . Tobacco comment: Smoking 4-5 cigarettes daily on average  Substance and Sexual Activity  . Alcohol use: No    Alcohol/week: 0.0 standard drinks  . Drug use: No  . Sexual activity: Not Currently  Other Topics Concern  . Not on file  Social  History Narrative  . Not on file   Social Determinants of Health   Financial Resource Strain:   . Difficulty of Paying Living Expenses:   Food Insecurity:   . Worried About Charity fundraiser in the Last Year:   . Arboriculturist in the Last Year:   Transportation Needs:   . Film/video editor (Medical):   Marland Kitchen Lack of Transportation (Non-Medical):   Physical Activity:   . Days of Exercise per Week:   . Minutes of Exercise per Session:   Stress:   . Feeling of Stress :   Social Connections:   . Frequency of Communication with Friends and Family:   . Frequency of Social Gatherings with Friends and Family:   . Attends Religious Services:   . Active Member of Clubs or Organizations:   . Attends Archivist Meetings:   Marland Kitchen Marital Status:   Intimate Partner Violence:   . Fear of Current or Ex-Partner:   . Emotionally Abused:   Marland Kitchen Physically Abused:   . Sexually Abused:     Past Medical History:  Diagnosis Date  . Arthritis   . Coronary artery disease   . Depression 06/12/11   Buproprion per Dr. Lamonte Sakai  . Dyspnea   . Full dentures    Fitting Per Dr. Enrique Sack  . Heart murmur   . History of kidney stones   .  Hypertension   . Hypothyroidism   . 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 Protein calorie malnutrition ([place]) 05/31/11  . Renal insufficiency 2012   Secondary to Hx. of Cisplatin and Dhydrtion  . Skin rash 01/2019  . Smoking 06/12/11   1/2 pack/day  . Status post chemotherapy 10/30/10 - 11/09/10    2 doses of Q 3 week Cisplatin  . Status post radiation therapy 10/11/10 - 12/24/10   Bilateral Neck and Mucosa axis /  70 gray in 35 fractions  . Xerostomia     Family History  Problem Relation Age of Onset  . Cancer Father 37       colon  . Cancer Sister 3       leukemia    Patient Active Problem List   Diagnosis Date Noted  . Right non-suppurative otitis media 02/01/2019  . Skin rash 02/01/2019  . Itching 02/01/2019  . Essential hypertension  09/08/2018  . Grieving 09/08/2018  . Shortness of breath 09/08/2018  . Cough 09/08/2018  . Tobacco use 09/08/2018  . Vitamin D deficiency 09/08/2018  . Renal calculi 02/11/2017  . Abnormal nuclear stress test   . Nephrolithiasis 11/14/2016  . Hypothyroidism 06/23/2015  . Noncompliance with follow up and medications 06/23/2015  . Acute kidney injury superimposed on chronic kidney disease ([place]) 06/22/2015  . Left nephrolithiasis 06/22/2015  . Hydronephrosis, left 06/22/2015  . 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 Oropharyngeal cancer ([place])   . Renal insufficiency     Current Status: Since his last office visit, he is doing well with no complaints. He states that his brother and sister-in-law have both tested positive for Coronavirus and he was last around them 1 week ago. He has been having symptoms of 'heavy breathing,' which is an ongoing issue since throat cancer.  He does continue to have recurring right ear infections, mild pain, and decreased hearing and drainage. He denies fevers, chills, fatigue, recent infections, weight loss, and night sweats. He has not had any headaches, visual changes, dizziness, and falls. No chest pain, heart palpitations, cough and shortness of breath reported. Deni'es GI problems such as nausea, vomiting, diarrhea, and constipation. He has no reports of blood in stools, dysuria and hematuria. No depression or anxiety reported. He is taking all medications as prescribed. He denies pain today.     Observations/Objective: Telephone Virtual Visit   Assessment and Plan:  1. Right non-suppurative otitis media Chronic ear infections. We will refer him to ENT. - Ambulatory referral to ENT - amoxicillin-clavulanate (AUGMENTIN) 875-125 MG tablet; Take 1 tablet by mouth 2 (two) times daily for 10 days.  Dispense:  20 tablet; Refill: 0  2. Oropharyngeal cancer ([place]) - Ambulatory referral to ENT  3. Drainage from right ear - Ambulatory referral to ENT - amoxicillin-clavulanate (AUGMENTIN) 875-125 MG tablet; Take 1 tablet by mouth 2 (two) times daily for 10 days.  Dispense: 20 tablet; Refill: 0  4. Shortness of breath Stable today. No signs or symptoms of respiratory distress reported today.   5. Cough  6. Essential hypertension The current medical regimen is effective; blood pressure is stable at 101/62 today; continue present plan and medications as prescribed. He will continue to take medications as prescribed, to decrease high sodium intake, excessive alcohol intake, increase potassium intake, smoking cessation, and increase physical activity of at least 30  minutes of cardio activity daily. He will continue to follow Heart Healthy or DASH diet. - POCT urinalysis dipstick  7. Follow up He will contact office to set up follow up appointment.   Meds ordered this encounter  Medications  . amoxicillin-clavulanate (AUGMENTIN) 875-125 MG tablet    Sig: Take 1 tablet by mouth 2 (two) times daily for 10 days.    Dispense:  20 tablet    Refill:  0    Orders Placed This Encounter  Procedures  . Ambulatory referral to ENT  . POCT urinalysis dipstick     Referral Orders     Ambulatory referral to ENT   Kathe Becton,  MSN, FNP-BC [place] [address] [place], [place] [address] [phone] fax   I discussed the assessment and treatment plan with the patient. The patient was provided an opportunity to ask questions and all were answered. The patient agreed with the plan and demonstrated an understanding of the instructions.   The patient was advised to call back or seek an in-person evaluation if the symptoms worsen or if the condition fails to improve as anticipated.  I provided 20 minutes of  non-face-to-face time during this encounter.   Azzie Glatter, FNP

## 2019-08-15 MED FILL — ?AMLODIPINE BESYL 10MG TABL: 10 | 30 days supply | Qty: 30 | Fill #4

## 2019-08-15 MED FILL — ?ERGOCALCIFEROL 50000 UNITC: 1.25 MG | 35 days supply | Qty: 5 | Fill #3

## 2019-08-15 MED FILL — ?ATORVASTATIN 40MG TABLET: 40 | 30 days supply | Qty: 30 | Fill #4

## 2019-08-15 MED FILL — ISOSORBIDE MN ER 30 MG TAB: 30 | 30 days supply | Qty: 30 | Fill #4

## 2019-09-04 MED FILL — METOPROLOL TARTRATE 25 MG T: 25 | 30 days supply | Qty: 60 | Fill #2

## 2019-09-10 MED FILL — hydrALAZINE HCL 25 MG TABS: 25 | 30 days supply | Qty: 180 | Fill #4

## 2019-09-10 MED FILL — AMLODIPINE BESYLATE 10 MG T: 10 | 30 days supply | Qty: 30 | Fill #5

## 2019-09-10 MED FILL — ISOSORBIDE MN ER 30 MG TAB: 30 | 30 days supply | Qty: 30 | Fill #5

## 2019-09-10 MED FILL — ATORVASTATIN CALCIUM 40 MG: 40 | 30 days supply | Qty: 30 | Fill #5

## 2019-09-12 MED FILL — ?LEVOTHYROXINE 137 MCG TAB: 137 | 30 days supply | Qty: 30 | Fill #4

## 2019-10-17 MED FILL — hydrALAZINE HCL 25 MG TABS: 25 | 30 days supply | Qty: 180 | Fill #5

## 2019-10-17 MED FILL — VIT D2 1.25 MG (50,000 UNIT: 1.25 MG | 35 days supply | Qty: 5 | Fill #4

## 2019-10-17 MED FILL — ?METOPROLOL TART 25MG TABLE: 25 | 30 days supply | Qty: 60 | Fill #3

## 2019-10-17 MED FILL — ?LEVOTHYROXINE 137 MCG TAB: 137 | 30 days supply | Qty: 30 | Fill #5

## 2019-10-17 MED FILL — AMLODIPINE BESYLATE 10 MG T: 10 | 30 days supply | Qty: 30 | Fill #6

## 2019-10-17 MED FILL — ISOSORBIDE MN ER 30 MG TAB: 30 | 30 days supply | Qty: 30 | Fill #6

## 2019-10-17 MED FILL — ?ATORVASTATIN 40MG TABLET: 40 | 30 days supply | Qty: 30 | Fill #6

## 2019-11-26 ENCOUNTER — Other Ambulatory Visit: Payer: Self-pay | Admitting: Family Medicine

## 2019-11-26 DIAGNOSIS — I10 Essential (primary) hypertension: Secondary | ICD-10-CM

## 2019-11-26 MED FILL — ?LEVOTHYROXINE 137 MCG TAB: 137 | 30 days supply | Qty: 30 | Fill #6

## 2019-11-26 MED FILL — ?ATORVASTATIN 40MG TABLET: 40 | 30 days supply | Qty: 30 | Fill #0

## 2019-11-26 MED FILL — hydrALAZINE HCL 25 MG TABS: 25 | 30 days supply | Qty: 180 | Fill #6

## 2019-11-26 MED FILL — ISOSORBIDE MN ER 30 MG TAB: 30 | 30 days supply | Qty: 30 | Fill #0

## 2019-11-26 MED FILL — ?METOPROLOL TART 25MG TABLE: 25 | 30 days supply | Qty: 60 | Fill #4

## 2019-11-26 MED FILL — VIT D2 1.25 MG (50,000 UNIT: 1.25 MG | 35 days supply | Qty: 5 | Fill #5

## 2019-11-26 MED FILL — AMLODIPINE BESYLATE 10 MG T: 10 | 30 days supply | Qty: 30 | Fill #0

## 2019-12-12 ENCOUNTER — Emergency Department (HOSPITAL_COMMUNITY)
Admission: EM | Admit: 2019-12-12 | Discharge: 2019-12-12 | Disposition: A | Discharge status: Home / Self Care | Payer: Medicare Other | Attending: Emergency Medicine | Admitting: Emergency Medicine

## 2019-12-12 ENCOUNTER — Other Ambulatory Visit: Payer: Self-pay

## 2019-12-12 ENCOUNTER — Other Ambulatory Visit (HOSPITAL_COMMUNITY): Payer: Self-pay | Admitting: Emergency Medicine

## 2019-12-12 ENCOUNTER — Emergency Department (HOSPITAL_COMMUNITY): Payer: Medicare Other

## 2019-12-12 ENCOUNTER — Encounter (HOSPITAL_COMMUNITY): Payer: Self-pay | Admitting: *Deleted

## 2019-12-12 DIAGNOSIS — I714 Abdominal aortic aneurysm, without rupture, unspecified: Secondary | ICD-10-CM | POA: Diagnosis present

## 2019-12-12 DIAGNOSIS — Z79899 Other long term (current) drug therapy: Secondary | ICD-10-CM | POA: Diagnosis not present

## 2019-12-12 DIAGNOSIS — N132 Hydronephrosis with renal and ureteral calculous obstruction: Secondary | ICD-10-CM | POA: Diagnosis not present

## 2019-12-12 DIAGNOSIS — Z85818 Personal history of malignant neoplasm of other sites of lip, oral cavity, and pharynx: Secondary | ICD-10-CM | POA: Diagnosis not present

## 2019-12-12 DIAGNOSIS — I129 Hypertensive chronic kidney disease with stage 1 through stage 4 chronic kidney disease, or unspecified chronic kidney disease: Secondary | ICD-10-CM | POA: Diagnosis not present

## 2019-12-12 DIAGNOSIS — N2889 Other specified disorders of kidney and ureter: Secondary | ICD-10-CM | POA: Diagnosis not present

## 2019-12-12 DIAGNOSIS — R109 Unspecified abdominal pain: Secondary | ICD-10-CM | POA: Diagnosis present

## 2019-12-12 DIAGNOSIS — N189 Chronic kidney disease, unspecified: Secondary | ICD-10-CM | POA: Diagnosis not present

## 2019-12-12 DIAGNOSIS — N201 Calculus of ureter: Secondary | ICD-10-CM | POA: Diagnosis not present

## 2019-12-12 DIAGNOSIS — F17210 Nicotine dependence, cigarettes, uncomplicated: Secondary | ICD-10-CM | POA: Diagnosis not present

## 2019-12-12 DIAGNOSIS — N200 Calculus of kidney: Secondary | ICD-10-CM | POA: Diagnosis not present

## 2019-12-12 DIAGNOSIS — I2510 Atherosclerotic heart disease of native coronary artery without angina pectoris: Secondary | ICD-10-CM | POA: Diagnosis not present

## 2019-12-12 DIAGNOSIS — K5790 Diverticulosis of intestine, part unspecified, without perforation or abscess without bleeding: Secondary | ICD-10-CM | POA: Diagnosis not present

## 2019-12-12 DIAGNOSIS — E039 Hypothyroidism, unspecified: Secondary | ICD-10-CM | POA: Diagnosis not present

## 2019-12-12 DIAGNOSIS — K5792 Diverticulitis of intestine, part unspecified, without perforation or abscess without bleeding: Secondary | ICD-10-CM | POA: Diagnosis not present

## 2019-12-12 LAB — COMPREHENSIVE METABOLIC PANEL
ALT: 27 U/L (ref 0–44)
AST: 17 U/L (ref 15–41)
Albumin: 3.8 g/dL (ref 3.5–5.0)
Alkaline Phosphatase: 94 U/L (ref 38–126)
Anion gap: 9 (ref 5–15)
BUN: 19 mg/dL (ref 8–23)
CO2: 20 mmol/L — ABNORMAL LOW (ref 22–32)
Calcium: 8.9 mg/dL (ref 8.9–10.3)
Chloride: 111 mmol/L (ref 98–111)
Creatinine, Ser: 1.62 mg/dL — ABNORMAL HIGH (ref 0.61–1.24)
GFR, Estimated: 44 mL/min — ABNORMAL LOW (ref >60)
Glucose, Bld: 126 mg/dL — ABNORMAL HIGH (ref 70–99)
Potassium: 4.5 mmol/L (ref 3.5–5.1)
Sodium: 140 mmol/L (ref 135–145)
Total Bilirubin: 0.5 mg/dL (ref 0.3–1.2)
Total Protein: 7.1 g/dL (ref 6.5–8.1)

## 2019-12-12 LAB — URINALYSIS, ROUTINE W REFLEX MICROSCOPIC
Bilirubin Urine: NEGATIVE
Glucose, UA: NEGATIVE mg/dL
Ketones, ur: NEGATIVE mg/dL
Leukocytes,Ua: NEGATIVE
Nitrite: NEGATIVE
Protein, ur: NEGATIVE mg/dL
RBC / HPF: >50 RBC/hpf — ABNORMAL HIGH (ref 0–5)
Specific Gravity, Urine: 1.015 (ref 1.005–1.030)
pH: 5 (ref 5.0–8.0)

## 2019-12-12 LAB — CBC WITH DIFFERENTIAL/PLATELET
Abs Immature Granulocytes: 0.16 10*3/uL — ABNORMAL HIGH (ref 0.00–0.07)
Basophils Absolute: 0.1 10*3/uL (ref 0.0–0.1)
Basophils Relative: 1 %
Eosinophils Absolute: 0.1 10*3/uL (ref 0.0–0.5)
Eosinophils Relative: 1 %
HCT: 46.7 % (ref 39.0–52.0)
Hemoglobin: 14.8 g/dL (ref 13.0–17.0)
Immature Granulocytes: 1 %
Lymphocytes Relative: 7 %
Lymphs Abs: 1 10*3/uL (ref 0.7–4.0)
MCH: 26.5 pg (ref 26.0–34.0)
MCHC: 31.7 g/dL (ref 30.0–36.0)
MCV: 83.5 fL (ref 80.0–100.0)
Monocytes Absolute: 0.8 10*3/uL (ref 0.1–1.0)
Monocytes Relative: 6 %
Neutro Abs: 11.9 10*3/uL — ABNORMAL HIGH (ref 1.7–7.7)
Neutrophils Relative %: 84 %
Platelets: 193 10*3/uL (ref 150–400)
RBC: 5.59 MIL/uL (ref 4.22–5.81)
RDW: 17.6 % — ABNORMAL HIGH (ref 11.5–15.5)
WBC: 14.1 10*3/uL — ABNORMAL HIGH (ref 4.0–10.5)
nRBC: 0 % (ref 0.0–0.2)

## 2019-12-12 MED ORDER — HYDROMORPHONE HCL 1 MG/ML IJ SOLN
1.0000 mg | Freq: Once | INTRAMUSCULAR | Status: AC
  Administered 2019-12-12: 1 mg via INTRAVENOUS
  Filled 2019-12-12: qty 1

## 2019-12-12 MED ORDER — SODIUM CHLORIDE 0.9 % IV BOLUS
1000.0000 mL | Freq: Once | INTRAVENOUS | Status: AC
  Administered 2019-12-12: 1000 mL via INTRAVENOUS

## 2019-12-12 MED ORDER — ONDANSETRON HCL 4 MG/2ML IJ SOLN
4.0000 mg | Freq: Once | INTRAMUSCULAR | Status: AC
  Administered 2019-12-12: 4 mg via INTRAVENOUS
  Filled 2019-12-12: qty 2

## 2019-12-12 MED ORDER — HYDROCODONE-ACETAMINOPHEN 5-325 MG PO TABS
1.0000 | ORAL_TABLET | Freq: Four times a day (QID) | ORAL | 0 refills | Status: AC | PRN
Start: 2019-12-12 — End: 2019-12-15

## 2019-12-12 MED ORDER — TAMSULOSIN HCL 0.4 MG PO CAPS
0.4000 mg | ORAL_CAPSULE | Freq: Every day | ORAL | 0 refills | Status: DC
Start: 2019-12-12 — End: 2020-01-01

## 2019-12-12 NOTE — ED Triage Notes (Signed)
RLQ pain that pt feels it is a kidney stone, ? Appendix, dry heaves, no difficulty urinating.

## 2019-12-12 NOTE — ED Notes (Signed)
Urine culture sent to lab.

## 2019-12-12 NOTE — Discharge Instructions (Addendum)
I suspect most likely her symptoms today are related to a kidney stone on your right side. Please take the Flomax and follow-up with your urologist. Take the pain medicine as needed. Return to ER if your pain is uncontrolled, you develop nausea and vomiting, ANY fevers or other new concerning symptom.  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 These are likely incidental findings but you should discuss this in detail with your primary care doctor as you likely will need additional testing in the future.

## 2019-12-12 NOTE — ED Provider Notes (Addendum)
[facility] DEPT Provider Note   CSN: [id number] Arrival date & time: 12/12/19  1302     History Chief Complaint  Patient presents with  . Flank Pain    [name] [name] is a 64 y.o. male.  Presents to ER with right-sided abdominal pain, right flank pain.  He reports he has had history of prior kidney stones and this feels similar to past kidney stones or kidney stones on the left side.  Pain is intermittent, sharp, stabbing, has had nausea and dry heaves but no vomiting.  No dysuria or hematuria.  No fevers.  HPI     Past Medical History:  Diagnosis Date  . Arthritis   . Chronic right ear pain 07/2019  . Coronary artery disease   . Depression 06/12/11   Buproprion per Dr. Lamonte Sakai  . Drainage from ear, right 07/2019  . Dyspnea   . Full dentures    Fitting Per Dr. Enrique Sack  . Heart murmur   . History of kidney stones   . Hypertension   . Hypothyroidism   . 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 Protein calorie malnutrition ([place]) 05/31/11  . Renal insufficiency 2012   Secondary to Hx. of Cisplatin and Dhydrtion  . Skin rash 01/2019  . Smoking 06/12/11   1/2 pack/day  . Status post chemotherapy 10/30/10 - 11/09/10    2 doses of Q 3 week Cisplatin  . Status post radiation therapy 10/11/10 - 12/24/10   Bilateral Neck and Mucosa axis /  70 gray in 35 fractions  . Xerostomia     Patient Active Problem List   Diagnosis Date Noted  . Abdominal aortic aneurysm (AAA) without rupture ([place]) 12/12/2019  . Drainage from right ear 08/01/2019  . Right non-suppurative otitis media 02/01/2019  . Skin rash 02/01/2019  . Itching 02/01/2019  . Essential hypertension 09/08/2018  . Grieving 09/08/2018  . Shortness of breath 09/08/2018  . Cough 09/08/2018  . Tobacco use 09/08/2018  . Vitamin D deficiency 09/08/2018  . Renal calculi 02/11/2017  . Abnormal nuclear stress test   . Nephrolithiasis 11/14/2016  . Hypothyroidism 06/23/2015  . Noncompliance  with follow up and medications 06/23/2015  . Acute kidney injury superimposed on chronic kidney disease ([place]) 06/22/2015  . Left nephrolithiasis 06/22/2015  . Hydronephrosis, left 06/22/2015  . 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 Oropharyngeal cancer ([place])   . Renal insufficiency     Past Surgical History:  Procedure Laterality Date  . APPENDECTOMY    . BIOPSY TONGUE     TonsilandBaseoftongue  . HERNIA REPAIR     umbilical with mesh  . IR NEPHROSTOMY EXCHANGE LEFT  01/10/2017  . IR NEPHROSTOMY PLACEMENT LEFT  11/15/2016  . LEFT HEART CATH AND CORONARY ANGIOGRAPHY N/A 01/18/2017   Procedure: LEFT HEART CATH AND CORONARY ANGIOGRAPHY;  Surgeon: [name], MD;  Location: [facility];  Service: Cardiovascular;  Laterality: N/A;  . NEPHROLITHOTOMY Left 02/11/2017   Procedure: NEPHROLITHOTOMY PERCUTANEOUS;  Surgeon: [name], MD;  Location: [facility];  Service: Urology;  Laterality: Left;  . PEG TUBE REMOVAL  04/13/11  . TONSILLECTOMY     right side       Family History  Problem Relation Age of Onset  . Cancer Father 6       colon  . Cancer  Sister 50       leukemia    Social History   Tobacco Use  . Smoking status: Current Every Day Smoker    Packs/day: 0.25    Years: 25.00    Pack years: 6.25    Types: Cigarettes  . Smokeless tobacco: Never Used  . Tobacco comment: Smoking 4-5 cigarettes daily on average  Vaping Use  . Vaping Use: Never used  Substance Use Topics  . Alcohol use: No    Alcohol/week: 0.0 standard drinks  . Drug use: No    Home Medications Prior to Admission medications   Medication Sig Start Date End Date Taking? Authorizing Provider  albuterol (PROVENTIL HFA;VENTOLIN HFA) 108 (90 Base) MCG/ACT inhaler Inhale 2 puffs into the lungs every 4 (four) hours as needed for wheezing or  shortness of breath (cough, shortness of breath or wheezing.). 11/22/17   Azzie Glatter, FNP  amLODipine (NORVASC) 10 MG tablet TAKE 1 TABLET (10 MG TOTAL) BY MOUTH DAILY. 11/26/19   Azzie Glatter, FNP  atorvastatin (LIPITOR) 40 MG tablet TAKE 1 TABLET (40 MG TOTAL) BY MOUTH DAILY. 11/26/19 06/23/20  Azzie Glatter, FNP  budesonide-formoterol (SYMBICORT) 80-4.5 MCG/ACT inhaler Inhale 2 puffs into the lungs 2 (two) times daily. 11/22/17   Azzie Glatter, FNP  hydrALAZINE (APRESOLINE) 25 MG tablet Take 2 tablets (50 mg total) by mouth every 8 (eight) hours. 04/12/19   Azzie Glatter, FNP  HYDROcodone-acetaminophen (NORCO/VICODIN) 5-325 MG tablet Take 1 tablet by mouth every 6 (six) hours as needed for up to 3 days for moderate pain. 12/12/19 12/15/19  Lucrezia Starch, MD  hydrocortisone cream 1 % Apply 1 application topically 2 (two) times daily. 01/31/19   Azzie Glatter, FNP  isosorbide mononitrate (IMDUR) 30 MG 24 hr tablet TAKE 1 TABLET (30 MG TOTAL) BY MOUTH DAILY. 11/26/19   Azzie Glatter, FNP  levothyroxine (SYNTHROID) 137 MCG tablet Take 1 tablet (137 mcg total) by mouth daily before breakfast. 04/12/19   Azzie Glatter, FNP  metoprolol tartrate (LOPRESSOR) 25 MG tablet Take 1 tablet (25 mg total) by mouth 2 (two) times daily. 04/12/19   Azzie Glatter, FNP  tamsulosin (FLOMAX) 0.4 MG CAPS capsule Take 1 capsule (0.4 mg total) by mouth daily. 12/12/19   Lucrezia Starch, MD  Vitamin D, Ergocalciferol, (DRISDOL) 1.25 MG (50000 UNIT) CAPS capsule Take 1 capsule (50,000 Units total) by mouth every 7 (seven) days. 04/12/19   Azzie Glatter, FNP    Allergies    Codeine  Review of Systems   Review of Systems  Constitutional: Negative for chills and fever.  HENT: Negative for ear pain and sore throat.   Eyes: Negative for pain and visual disturbance.  Respiratory: Negative for cough and shortness of breath.   Cardiovascular: Negative for chest pain and palpitations.    Gastrointestinal: Negative for abdominal pain and vomiting.  Genitourinary: Negative for dysuria and hematuria.  Musculoskeletal: Negative for arthralgias and back pain.  Skin: Negative for color change and rash.  Neurological: Negative for seizures and syncope.  All other systems reviewed and are negative.   Physical Exam Updated Vital Signs BP (!) 152/85 (BP Location: Left Arm)   Pulse 62   Temp 98 F (36.7 C) (Oral)   Resp 18   Ht 5\' 9"  (1.753 m)   Wt 77.1 kg   SpO2 96%   BMI 25.10 kg/m   Physical Exam Vitals and nursing note reviewed.  Constitutional:  Appearance: He is well-developed.  HENT:     Head: Normocephalic and atraumatic.  Eyes:     Conjunctiva/sclera: Conjunctivae normal.  Cardiovascular:     Rate and Rhythm: Normal rate and regular rhythm.     Heart sounds: No murmur heard.   Pulmonary:     Effort: Pulmonary effort is normal. No respiratory distress.     Breath sounds: Normal breath sounds.  Abdominal:     General: Abdomen is flat. There is no distension.     Palpations: Abdomen is soft. There is no mass.     Comments: Some TTP over R flank  Musculoskeletal:        General: No deformity or signs of injury.     Cervical back: Neck supple.  Skin:    General: Skin is warm and dry.  Neurological:     General: No focal deficit present.     Mental Status: He is alert.  Psychiatric:        Mood and Affect: Mood normal.        Behavior: Behavior normal.     ED Results / Procedures / Treatments   Labs (all labs ordered are listed, but only abnormal results are displayed) Labs Reviewed  URINALYSIS, ROUTINE W REFLEX MICROSCOPIC - Abnormal; Notable for the following components:      Result Value   Hgb urine dipstick MODERATE (*)    RBC / HPF >50 (*)    Bacteria, UA RARE (*)    All other components within normal limits  CBC WITH DIFFERENTIAL/PLATELET - Abnormal; Notable for the following components:   WBC 14.1 (*)    RDW 17.6 (*)    Neutro  Abs 11.9 (*)    Abs Immature Granulocytes 0.16 (*)    All other components within normal limits  COMPREHENSIVE METABOLIC PANEL - Abnormal; Notable for the following components:   CO2 20 (*)    Glucose, Bld 126 (*)    Creatinine, Ser 1.62 (*)    GFR, Estimated 44 (*)    All other components within normal limits    EKG None  Radiology CT Renal Stone Study  Result Date: 12/12/2019 CLINICAL DATA:  Flank pain, stone disease suspected, right lower quadrant pain, urolithiasis versus appendicitis EXAM: CT ABDOMEN AND PELVIS WITHOUT CONTRAST TECHNIQUE: Multidetector CT imaging of the abdomen and pelvis was performed following the standard protocol without IV contrast. COMPARISON:  CT 02/11/2017 FINDINGS: Lower chest: Lung bases are clear. Normal heart size. No pericardial effusion. Coronary artery calcifications are present. Hepatobiliary: No visible focal liver lesion on this unenhanced CT. Normal liver attenuation. Smooth liver surface contour. Normal gallbladder and biliary tree without visible calcified gallstone. Pancreas: Few small fatty clefts, benign incidental finding. No pancreatic ductal dilatation or surrounding inflammatory changes. Spleen: Normal in size. No concerning splenic lesions. Adrenals/Urinary Tract: Mild lobular thickening of the adrenal glands similar to comparisons, possibly cement nodular renal hyperplasia versus small adenomata too small to characterize. Kidneys are normally located. No concerning focal renal lesions are visible. Bilateral perinephric stranding is present slightly asymmetrically increased on the right with mild hydronephrosis to the level of a 5 mm calculus positioned at the right ureteropelvic junction (2/38). A second calcification closely approximating the ureter on axial imaging (2/64) appears to be vascular in nature when viewed on coronal and sagittal reconstructions. No other obstructive urolithiasis. No left urinary tract dilatation. Few additional  nonobstructing calculi bilaterally. Circumferential bladder wall thickening perivesicular haze. Indentation of the bladder base by an enlarged prostate.  Stomach/Bowel: Small sliding-type hiatal hernia. Distal stomach and duodenum are unremarkable. No small bowel thickening dilatation. Appendix is not visualized. Review of the patient's chart details a history of prior appendectomy. There is some mild edematous mural thickening of the proximal colon to the hepatic flexure with a second segment of more distal thickening with pericolonic hazy stranding and increased vascularity of the sigmoid and rectum. The latter finding is seen in a region of numerous colonic diverticula including several larger air-filled diverticula towards the distal sigmoid (6/93, 2/68) but without a single focal culprit diverticulum. No evidence of obstruction. Vascular/Lymphatic: Atherosclerotic calcifications within the abdominal aorta and branch vessels. There is irregular multilobular fusiform aneurysmal dilatation of the infrarenal abdominal aorta. Maximal aortic diameter 3.3 cm. Increased vascularity adjacent the inflamed sigmoid colonic segment. Some reactive adenopathy in the abdomen and pelvis. No enlarged abdominopelvic lymph nodes. Reproductive: Prostatomegaly with few likely benign eccentric calcifications. Seminal vesicles are unremarkable. Other: Inflammatory changes about the kidneys, bladder and sigmoid, as above. No abdominopelvic free air or fluid. No bowel containing hernias. Small fat containing inguinal hernias bilaterally. Musculoskeletal: Multilevel degenerative changes are present in the imaged portions of the spine. Mild levocurvature of the lumbar spine. Stable sclerotic focus in the anteroinferior corner L4, likely bone island. No worrisome osseous lesions. Musculature is normal and symmetric. IMPRESSION: 1. Obstructing 5 mm calculus positioned at the right ureteropelvic junction with associated mild right  hydronephrosis and perinephric stranding. Additional nonobstructing bilateral nephrolithiasis. Given some nonspecific perinephric stranding and perivesicular haze, could correlate with urinalysis to exclude superimposed urinary tract infection. Alternatively, bladder wall thickening may be related to an enlarged prostate and chronic outlet obstruction. 2. Mild edematous mural thickening of the proximal colon to the hepatic flexure with a second segment of more pronounced distal thickening, pericolonic hazy stranding and increased vascularity of the sigmoid and rectum. The latter finding is seen in a region of numerous colonic diverticula including several larger air-filled diverticula towards the distal sigmoid but without a single focal culprit diverticulum. Findings could reflect a colitis of infectious or inflammatory etiology versus a more segmental colitis associated with diverticulosis. Outpatient evaluation with direct visualization is warranted following resolution of acute symptoms. 3. Nonvisualization of the appendix with chart indicating a history of prior appendectomy. 4. 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 Recommend follow-up ultrasound every 3 years. This recommendation follows ACR consensus guidelines: White Paper of the ACR Incidental Findings Committee II on Vascular Findings. J Am Coll Radiol 2013; 57:322-025. 5. Aortic Atherosclerosis (ICD10-I70.0). 6. Coronary artery calcifications. Electronically Signed   By: Lovena Le M.D.   On: 12/12/2019 15:46    Procedures Procedures (including critical care time)  Medications Ordered in ED Medications  HYDROmorphone (DILAUDID) injection 1 mg (1 mg Intravenous Given 12/12/19 1625)  ondansetron (ZOFRAN) injection 4 mg (4 mg Intravenous Given 12/12/19 1625)  sodium chloride 0.9 % bolus 1,000 mL (0 mLs Intravenous Stopped 12/12/19 1748)    ED Course  I have reviewed the triage vital signs and  the nursing notes.  Pertinent labs & imaging results that were available during my care of the patient were reviewed by me and considered in my medical decision making (see chart for details).  Clinical Course as of Dec 13 719  Wed Dec 12, 2019  1718 Symptoms improved, feels pain is well controlled at present.   [RD]    Clinical Course User Index [RD] Lucrezia Starch, MD   MDM Rules/Calculators/A&P  64 year old male presents to ER with intermittent right flank, right-sided abdominal pain.  On physical exam patient well-appearing in no distress, normal vital signs and afebrile.  UA negative for infection but did note moderate blood.  CT T renal study was performed and noted 5 mm calculus at UPJ, mild right hydronephrosis.  The radiologist commented on mild perinephric stranding, given the reassuring UA results, no fever, doubt this is related to current infectious process.  The radiologist also commented on mild edematous mural thickening of the proximal colon, possibly related to colitis or inflammatory process.  I believe patient's history, exam most likely is related to this 5 mm stone and less likely related to this questionable finding of colitis on CT.  This finding was discussed with patient and recommended he have outpatient follow-up with his primary doctor.  Additionally radiologist commented on multilobar fusiform aneurysmal dilation of infrarenal abdominal aorta.  Suspect this is incidental finding, also notify patient and recommended follow-up with his primary doctor for ongoing monitoring.  Patient symptoms are well controlled on reassessment.  Believe he is appropriate for outpatient management at this time.  Reports he has been seen and evaluated by urology previously.  Recommend that he schedule close follow-up appointment with urology.  Provided Percocet for as needed pain medicine, recommended Flomax, discharged home.    After the discussed  management above, the patient was determined to be safe for discharge.  The patient was in agreement with this plan and all questions regarding their care were answered.  ED return precautions were discussed and the patient will return to the ED with any significant worsening of condition.    Final Clinical Impression(s) / ED Diagnoses Final diagnoses:  Ureterolithiasis  Abdominal aortic aneurysm (AAA) without rupture ([place])  Diverticulosis    Rx / DC Orders ED Discharge Orders         Ordered    tamsulosin (FLOMAX) 0.4 MG CAPS capsule  Daily        12/12/19 1726    HYDROcodone-acetaminophen (NORCO/VICODIN) 5-325 MG tablet  Every 6 hours PRN        12/12/19 1726           Lucrezia Starch, MD 12/13/19 3748    Lucrezia Starch, MD 01/01/20 [phone]

## 2019-12-13 MED FILL — TAMSULOSIN HCL 0.4 MG CAP: 0.4 | 30 days supply | Qty: 30 | Fill #0

## 2019-12-18 ENCOUNTER — Ambulatory Visit: Payer: Self-pay | Admitting: Cardiovascular Disease

## 2020-01-01 ENCOUNTER — Telehealth: Payer: Self-pay | Admitting: Family Medicine

## 2020-01-01 ENCOUNTER — Other Ambulatory Visit: Payer: Self-pay | Admitting: Family Medicine

## 2020-01-01 DIAGNOSIS — E039 Hypothyroidism, unspecified: Secondary | ICD-10-CM

## 2020-01-01 DIAGNOSIS — I10 Essential (primary) hypertension: Secondary | ICD-10-CM

## 2020-01-01 DIAGNOSIS — E559 Vitamin D deficiency, unspecified: Secondary | ICD-10-CM

## 2020-01-01 DIAGNOSIS — R0602 Shortness of breath: Secondary | ICD-10-CM

## 2020-01-01 DIAGNOSIS — R21 Rash and other nonspecific skin eruption: Secondary | ICD-10-CM

## 2020-01-01 MED ORDER — ALBUTEROL SULFATE HFA 108 (90 BASE) MCG/ACT IN AERS: 2.0000 | INHALATION_SPRAY | RESPIRATORY_TRACT | 11 refills | Status: DC | PRN

## 2020-01-01 MED ORDER — HYDROCORTISONE 1 % EX CREA: 1.0000 "application " | TOPICAL_CREAM | Freq: Two times a day (BID) | CUTANEOUS | 6 refills | Status: DC

## 2020-01-01 MED ORDER — ISOSORBIDE MONONITRATE ER 30 MG PO TB24: 30.0000 mg | ORAL_TABLET | Freq: Every day | ORAL | 3 refills | Status: DC

## 2020-01-01 MED ORDER — VITAMIN D (ERGOCALCIFEROL) 1.25 MG (50000 UNIT) PO CAPS: 50000.0000 [IU] | ORAL_CAPSULE | ORAL | 6 refills | Status: DC

## 2020-01-01 MED ORDER — HYDRALAZINE HCL 25 MG PO TABS: 50.0000 mg | ORAL_TABLET | Freq: Three times a day (TID) | ORAL | 3 refills | Status: DC

## 2020-01-01 MED ORDER — BUDESONIDE-FORMOTEROL FUMARATE 80-4.5 MCG/ACT IN AERO: 2.0000 | INHALATION_SPRAY | Freq: Two times a day (BID) | RESPIRATORY_TRACT | 11 refills | Status: DC

## 2020-01-01 MED ORDER — AMLODIPINE BESYLATE 10 MG PO TABS: 10.0000 mg | ORAL_TABLET | Freq: Every day | ORAL | 3 refills | Status: DC

## 2020-01-01 MED ORDER — METOPROLOL TARTRATE 25 MG PO TABS: 25.0000 mg | ORAL_TABLET | Freq: Two times a day (BID) | ORAL | 3 refills | Status: DC

## 2020-01-01 MED ORDER — ATORVASTATIN CALCIUM 40 MG PO TABS: 40.0000 mg | ORAL_TABLET | Freq: Every day | ORAL | 3 refills | Status: DC

## 2020-01-01 MED ORDER — LEVOTHYROXINE SODIUM 137 MCG PO TABS: 137.0000 ug | ORAL_TABLET | Freq: Every day | ORAL | 3 refills | Status: DC

## 2020-01-01 MED ORDER — TAMSULOSIN HCL 0.4 MG PO CAPS
0.4000 mg | ORAL_CAPSULE | Freq: Every day | ORAL | 3 refills | Status: DC
Start: 2020-01-01 — End: 2020-05-23

## 2020-01-01 MED FILL — METOPROLOL TARTRATE 25 MG T: 25 | 30 days supply | Qty: 60 | Fill #5

## 2020-01-01 MED FILL — VIT D2 1.25 MG (50,000 UNIT: 1.25 MG | 35 days supply | Qty: 5 | Fill #6

## 2020-01-01 MED FILL — TAMSULOSIN HCL 0.4 MG CAP: 0.4 | 30 days supply | Qty: 30 | Fill #0

## 2020-01-01 NOTE — Telephone Encounter (Signed)
Please verify that you filled all of these today. It appears that you did.

## 2020-01-02 ENCOUNTER — Encounter: Payer: Self-pay | Admitting: Family Medicine

## 2020-01-02 ENCOUNTER — Other Ambulatory Visit: Payer: Self-pay

## 2020-01-02 ENCOUNTER — Ambulatory Visit (INDEPENDENT_AMBULATORY_CARE_PROVIDER_SITE_OTHER): Payer: Medicare Other | Admitting: Family Medicine

## 2020-01-02 VITALS — BP 131/74 | HR 59 | Temp 98.5°F | Resp 16 | Ht 69.0 in | Wt 164.0 lb

## 2020-01-02 DIAGNOSIS — E039 Hypothyroidism, unspecified: Secondary | ICD-10-CM | POA: Diagnosis not present

## 2020-01-02 DIAGNOSIS — I2510 Atherosclerotic heart disease of native coronary artery without angina pectoris: Secondary | ICD-10-CM

## 2020-01-02 DIAGNOSIS — N201 Calculus of ureter: Secondary | ICD-10-CM

## 2020-01-02 DIAGNOSIS — R0602 Shortness of breath: Secondary | ICD-10-CM

## 2020-01-02 DIAGNOSIS — Z23 Encounter for immunization: Secondary | ICD-10-CM | POA: Diagnosis not present

## 2020-01-02 DIAGNOSIS — I10 Essential (primary) hypertension: Secondary | ICD-10-CM

## 2020-01-02 DIAGNOSIS — Z09 Encounter for follow-up examination after completed treatment for conditions other than malignant neoplasm: Secondary | ICD-10-CM

## 2020-01-02 DIAGNOSIS — Z0000 Encounter for general adult medical examination without abnormal findings: Secondary | ICD-10-CM

## 2020-01-02 MED FILL — ALBUTEROL SULFATE HFA 108 (: 108 (90 BAS | 25 days supply | Qty: 18 | Fill #0

## 2020-01-02 MED FILL — AMLODIPINE BESYLATE 10 MG T: 10 | 90 days supply | Qty: 90 | Fill #0

## 2020-01-02 MED FILL — LEVOTHYROXINE 137 MCG TAB: 137 | 90 days supply | Qty: 90 | Fill #0

## 2020-01-02 MED FILL — hydrALAZINE HCL 25 MG TABS: 25 | 30 days supply | Qty: 180 | Fill #0

## 2020-01-02 MED FILL — SYMBICORT 80-4.5 MCG INH: 80-4.5 | 30 days supply | Qty: 10 | Fill #0

## 2020-01-02 MED FILL — ATORVASTATIN CALCIUM 40 MG: 40 | 90 days supply | Qty: 90 | Fill #0

## 2020-01-02 MED FILL — ISOSORBIDE MN ER 30 MG TAB: 30 | 90 days supply | Qty: 90 | Fill #0

## 2020-01-02 NOTE — Progress Notes (Signed)
Patient [facility]   Established Patient Office Visit  Subjective:  Patient ID: [name] [name], male    DOB: Apr 13, 1955  Age: 64 y.o. MRN: [id number]  CC:  Chief Complaint  Patient presents with  . Follow-up    Pt  states he feels like crap most of the time.    HPI [name] [name] is a 64 year old male who presents for Follow Up today.   Past Medical History:  Diagnosis Date  . Arthritis   . Chronic right ear pain 07/2019  . Coronary artery disease   . Depression 06/12/11   Buproprion per Dr. Lamonte Sakai  . Drainage from ear, right 07/2019  . Dyspnea   . Full dentures    Fitting Per Dr. Enrique Sack  . Heart murmur   . History of kidney stones   . Hypertension   . Hypothyroidism   . 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 Protein calorie malnutrition ([place]) 05/31/11  . Renal insufficiency 2012   Secondary to Hx. of Cisplatin and Dhydrtion  . Skin rash 01/2019  . Smoking 06/12/11   1/2 pack/day  . Status post chemotherapy 10/30/10 - 11/09/10    2 doses of Q 3 week Cisplatin  . Status post radiation therapy 10/11/10 - 12/24/10   Bilateral Neck and Mucosa axis /  70 gray in 35 fractions  . Xerostomia     Past Surgical History:  Procedure Laterality Date  . APPENDECTOMY    . BIOPSY TONGUE     TonsilandBaseoftongue  . HERNIA REPAIR     umbilical with mesh  . IR NEPHROSTOMY EXCHANGE LEFT  01/10/2017  . IR NEPHROSTOMY PLACEMENT LEFT  11/15/2016  . LEFT HEART CATH AND CORONARY ANGIOGRAPHY N/A 01/18/2017   Procedure: LEFT HEART CATH AND CORONARY ANGIOGRAPHY;  Surgeon: [name], MD;  Location: [facility];  Service: Cardiovascular;  Laterality: N/A;  . NEPHROLITHOTOMY Left 02/11/2017   Procedure: NEPHROLITHOTOMY PERCUTANEOUS;  Surgeon: [name], MD;  Location: [facility];  Service: Urology;  Laterality: Left;  . PEG TUBE REMOVAL  04/13/11  . TONSILLECTOMY     right side    Family History  Problem Relation Age of Onset  .  Cancer Father 57       colon  . Cancer Sister 21       leukemia    Social History   Socioeconomic History  . Marital status: Single    Spouse name: Not on file  . Number of children: Not on file  . Years of education: Not on file  . Highest education level: Not on file  Occupational History  . Not on file  Tobacco Use  . Smoking status: Current Every Day Smoker    Packs/day: 0.25    Years: 25.00    Pack years: 6.25    Types: Cigarettes  . Smokeless tobacco: Never Used  . Tobacco comment: Smoking 4-5 cigarettes daily on average  Vaping Use  . Vaping Use: Never used  Substance and Sexual Activity  . Alcohol use: No    Alcohol/week: 0.0 standard drinks  . Drug use: No  . Sexual activity: Not Currently  Other Topics Concern  . Not on file  Social History Narrative  . Not on file   Social Determinants of Health   Financial Resource Strain:   . Difficulty of Paying Living Expenses: Not on file  Food Insecurity:   . Worried About Estate manager/land agent  of Food in the Last Year: Not on file  . Ran Out of Food in the Last Year: Not on file  Transportation Needs:   . Lack of Transportation (Medical): Not on file  . Lack of Transportation (Non-Medical): Not on file  Physical Activity:   . Days of Exercise per Week: Not on file  . Minutes of Exercise per Session: Not on file  Stress:   . Feeling of Stress : Not on file  Social Connections:   . Frequency of Communication with Friends and Family: Not on file  . Frequency of Social Gatherings with Friends and Family: Not on file  . Attends Religious Services: Not on file  . Active Member of Clubs or Organizations: Not on file  . Attends Archivist Meetings: Not on file  . Marital Status: Not on file  Intimate Partner Violence:   . Fear of Current or Ex-Partner: Not on file  . Emotionally Abused: Not on file  . Physically Abused: Not on file  . Sexually Abused: Not on file    Outpatient Medications Prior to Visit    Medication Sig Dispense Refill  . albuterol (VENTOLIN HFA) 108 (90 Base) MCG/ACT inhaler Inhale 2 puffs into the lungs every 4 (four) hours as needed for wheezing or shortness of breath (cough, shortness of breath or wheezing.). 6.7 each 11  . amLODipine (NORVASC) 10 MG tablet Take 1 tablet (10 mg total) by mouth daily. 90 tablet 3  . atorvastatin (LIPITOR) 40 MG tablet Take 1 tablet (40 mg total) by mouth daily. 90 tablet 3  . budesonide-formoterol (SYMBICORT) 80-4.5 MCG/ACT inhaler Inhale 2 puffs into the lungs 2 (two) times daily. 6.9 each 11  . hydrALAZINE (APRESOLINE) 25 MG tablet Take 2 tablets (50 mg total) by mouth every 8 (eight) hours. 180 tablet 3  . hydrocortisone cream 1 % Apply 1 application topically 2 (two) times daily. 30 g 6  . isosorbide mononitrate (IMDUR) 30 MG 24 hr tablet Take 1 tablet (30 mg total) by mouth daily. 90 tablet 3  . levothyroxine (SYNTHROID) 137 MCG tablet Take 1 tablet (137 mcg total) by mouth daily before breakfast. 90 tablet 3  . metoprolol tartrate (LOPRESSOR) 25 MG tablet Take 1 tablet (25 mg total) by mouth 2 (two) times daily. 180 tablet 3  . tamsulosin (FLOMAX) 0.4 MG CAPS capsule Take 1 capsule (0.4 mg total) by mouth daily. 90 capsule 3  . Vitamin D, Ergocalciferol, (DRISDOL) 1.25 MG (50000 UNIT) CAPS capsule Take 1 capsule (50,000 Units total) by mouth every 7 (seven) days. 5 capsule 6   No facility-administered medications prior to visit.    Allergies  Allergen Reactions  . Codeine Nausea Only    ROS Review of Systems  Constitutional: Negative.   HENT: Negative.   Eyes: Negative.   Respiratory: Negative.   Cardiovascular: Positive for palpitations (occasional ).  Gastrointestinal: Negative.   Endocrine: Negative.   Genitourinary: Negative.   Musculoskeletal: Negative.   Skin: Negative.   Allergic/Immunologic: Negative.   Neurological: Positive for dizziness (occasional), numbness (left arm occasional ) and headaches (occasional ).   Hematological: Negative.   Psychiatric/Behavioral: Negative.    Objective:    Physical Exam Vitals and nursing note reviewed.  Constitutional:      Appearance: Normal appearance.  HENT:     Head: Normocephalic and atraumatic.     Nose: Nose normal.     Mouth/Throat:     Mouth: Mucous membranes are moist.     Pharynx:  Oropharynx is clear.  Cardiovascular:     Rate and Rhythm: Normal rate and regular rhythm.     Pulses: Normal pulses.     Heart sounds: Normal heart sounds.  Pulmonary:     Effort: Pulmonary effort is normal.  Abdominal:     General: Bowel sounds are normal.     Palpations: Abdomen is soft.  Musculoskeletal:        General: Normal range of motion.     Cervical back: Normal range of motion.  Skin:    General: Skin is warm and dry.  Neurological:     General: No focal deficit present.     Mental Status: He is alert and oriented to person, place, and time.  Psychiatric:        Mood and Affect: Mood normal.        Behavior: Behavior normal.        Thought Content: Thought content normal.        Judgment: Judgment normal.     BP 131/74 (BP Location: Left Arm, Patient Position: Sitting, Cuff Size: Normal)   Pulse (!) 59   Temp 98.5 F (36.9 C)   Resp 16   Ht 5\' 9"  (1.753 m)   Wt 164 lb (74.4 kg)   SpO2 98%   BMI 24.22 kg/m  Wt Readings from Last 3 Encounters:  01/02/20 164 lb (74.4 kg)  12/12/19 170 lb (77.1 kg)  08/01/19 168 lb 9.6 oz (76.5 kg)     Health Maintenance Due  Topic Date Due  . COVID-19 Vaccine (1) Never done  . COLONOSCOPY  Never done  . INFLUENZA VACCINE  09/30/2019  . PNA vac Low Risk Adult (1 of 2 - PCV13) 12/25/2019    There are no preventive care reminders to display for this patient.  Lab Results  Component Value Date   TSH 1.240 05/08/2018   Lab Results  Component Value Date   WBC 14.1 (H) 12/12/2019   HGB 14.8 12/12/2019   HCT 46.7 12/12/2019   MCV 83.5 12/12/2019   PLT 193 12/12/2019   Lab Results   Component Value Date   NA 140 12/12/2019   K 4.5 12/12/2019   CO2 20 (L) 12/12/2019   GLUCOSE 126 (H) 12/12/2019   BUN 19 12/12/2019   CREATININE 1.62 (H) 12/12/2019   BILITOT 0.5 12/12/2019   ALKPHOS 94 12/12/2019   AST 17 12/12/2019   ALT 27 12/12/2019   PROT 7.1 12/12/2019   ALBUMIN 3.8 12/12/2019   CALCIUM 8.9 12/12/2019   ANIONGAP 9 12/12/2019   Lab Results  Component Value Date   CHOL 128 03/10/2018   Lab Results  Component Value Date   HDL 25 (L) 03/10/2018   Lab Results  Component Value Date   LDLCALC 71 03/10/2018   Lab Results  Component Value Date   TRIG 159 (H) 03/10/2018   Lab Results  Component Value Date   CHOLHDL 5.1 (H) 03/10/2018   Lab Results  Component Value Date   HGBA1C 5.6 11/22/2017   Assessment & Plan:   1. Hospital discharge follow-up  2. Ureterolithiasis Stable.   3. Coronary artery disease without angina pectoris, unspecified vessel or lesion type, unspecified whether native or transplanted heart - Ambulatory referral to Cardiology  4. Essential hypertension The current medical regimen is effective; blood pressure is stable at 131/74 today; continue present plan and medications as prescribed. He will continue to take medications as prescribed, to decrease high sodium intake, excessive alcohol intake, increase  potassium intake, smoking cessation, and increase physical activity of at least 30 minutes of cardio activity daily. He will continue to follow Heart Healthy or DASH diet.  5. Hypothyroidism, unspecified type  6. Shortness of breath Stable. No signs or symptoms of respiratory distress noted or reported.   7. Healthcare maintenance - Flu Vaccine QUAD 6+ mos PF IM (Fluarix Quad PF)  8. Follow up He will follow up in 6 months.   Current Outpatient Medications on File Prior to Visit  Medication Sig Dispense Refill  . albuterol (VENTOLIN HFA) 108 (90 Base) MCG/ACT inhaler Inhale 2 puffs into the lungs every 4 (four) hours  as needed for wheezing or shortness of breath (cough, shortness of breath or wheezing.). 6.7 each 11  . amLODipine (NORVASC) 10 MG tablet Take 1 tablet (10 mg total) by mouth daily. 90 tablet 3  . atorvastatin (LIPITOR) 40 MG tablet Take 1 tablet (40 mg total) by mouth daily. 90 tablet 3  . budesonide-formoterol (SYMBICORT) 80-4.5 MCG/ACT inhaler Inhale 2 puffs into the lungs 2 (two) times daily. 6.9 each 11  . hydrALAZINE (APRESOLINE) 25 MG tablet Take 2 tablets (50 mg total) by mouth every 8 (eight) hours. 180 tablet 3  . hydrocortisone cream 1 % Apply 1 application topically 2 (two) times daily. 30 g 6  . isosorbide mononitrate (IMDUR) 30 MG 24 hr tablet Take 1 tablet (30 mg total) by mouth daily. 90 tablet 3  . levothyroxine (SYNTHROID) 137 MCG tablet Take 1 tablet (137 mcg total) by mouth daily before breakfast. 90 tablet 3  . metoprolol tartrate (LOPRESSOR) 25 MG tablet Take 1 tablet (25 mg total) by mouth 2 (two) times daily. 180 tablet 3  . tamsulosin (FLOMAX) 0.4 MG CAPS capsule Take 1 capsule (0.4 mg total) by mouth daily. 90 capsule 3  . Vitamin D, Ergocalciferol, (DRISDOL) 1.25 MG (50000 UNIT) CAPS capsule Take 1 capsule (50,000 Units total) by mouth every 7 (seven) days. 5 capsule 6   No current facility-administered medications on file prior to visit.    Orders Placed This Encounter  Procedures  . Flu Vaccine QUAD 6+ mos PF IM (Fluarix Quad PF)  . Ambulatory referral to Cardiology     Referral Orders     Ambulatory referral to Cardiology   Kathe Becton,  MSN, FNP-BC [facility] [address] [place], [place] [address] [phone]- fax  Problem List Items Addressed This Visit      Cardiovascular and Mediastinum   Essential hypertension     Endocrine   Hypothyroidism     Other   Shortness of breath    Other Visit Diagnoses    Hospital discharge follow-up    -   Primary   Ureterolithiasis       Coronary artery disease without angina pectoris, unspecified vessel or lesion type, unspecified whether native or transplanted heart       Relevant Orders   Ambulatory referral to Cardiology   Healthcare maintenance       Relevant Orders   Flu Vaccine QUAD 6+ mos PF IM (Fluarix Quad PF)   Follow up          No orders of the defined types were placed in this encounter.   Follow-up: No follow-ups on file.    Azzie Glatter, FNP

## 2020-01-02 NOTE — Telephone Encounter (Signed)
Done

## 2020-01-07 ENCOUNTER — Encounter: Payer: Self-pay | Admitting: Family Medicine

## 2020-02-01 MED FILL — VIT D2 1.25 MG (50,000 UNIT: 1.25 MG | 84 days supply | Qty: 12 | Fill #0

## 2020-02-01 MED FILL — METOPROLOL TARTRATE 25 MG T: 25 | 30 days supply | Qty: 60 | Fill #6

## 2020-02-08 MED FILL — VIT D2 1.25 MG (50,000 UNIT: 1.25 MG | 84 days supply | Qty: 12 | Fill #0

## 2020-02-14 MED FILL — hydrALAZINE HCL 25 MG TABS: 25 | 30 days supply | Qty: 180 | Fill #1

## 2020-02-14 MED FILL — METOPROLOL TARTRATE 25 MG T: 25 | 90 days supply | Qty: 180 | Fill #0

## 2020-04-02 MED FILL — ISOSORBIDE MN ER 30 MG TAB: 30 | 90 days supply | Qty: 90 | Fill #1

## 2020-04-02 MED FILL — AMLODIPINE BESYLATE 10 MG T: 10 | 90 days supply | Qty: 90 | Fill #1

## 2020-04-02 MED FILL — ATORVASTATIN CALCIUM 40 MG: 40 | 90 days supply | Qty: 90 | Fill #1

## 2020-04-02 MED FILL — hydrALAZINE HCL 25 MG TABS: 25 | 30 days supply | Qty: 180 | Fill #2

## 2020-04-17 MED FILL — LEVOTHYROXINE 137 MCG TAB: 137 | 90 days supply | Qty: 90 | Fill #1

## 2020-04-17 MED FILL — VIT D2 1.25 MG (50,000 UNIT: 1.25 MG | 84 days supply | Qty: 12 | Fill #1

## 2020-05-23 ENCOUNTER — Encounter: Payer: Self-pay | Admitting: Cardiovascular Disease

## 2020-05-23 ENCOUNTER — Ambulatory Visit: Payer: Medicare Other | Admitting: Cardiovascular Disease

## 2020-05-23 ENCOUNTER — Other Ambulatory Visit: Payer: Self-pay

## 2020-05-23 VITALS — BP 118/65 | HR 50 | Ht 69.0 in | Wt 163.8 lb

## 2020-05-23 DIAGNOSIS — Z79899 Other long term (current) drug therapy: Secondary | ICD-10-CM

## 2020-05-23 DIAGNOSIS — Z131 Encounter for screening for diabetes mellitus: Secondary | ICD-10-CM

## 2020-05-23 DIAGNOSIS — E038 Other specified hypothyroidism: Secondary | ICD-10-CM | POA: Diagnosis not present

## 2020-05-23 DIAGNOSIS — I10 Essential (primary) hypertension: Secondary | ICD-10-CM

## 2020-05-23 DIAGNOSIS — R0602 Shortness of breath: Secondary | ICD-10-CM

## 2020-05-23 DIAGNOSIS — I2510 Atherosclerotic heart disease of native coronary artery without angina pectoris: Secondary | ICD-10-CM

## 2020-05-23 DIAGNOSIS — E785 Hyperlipidemia, unspecified: Secondary | ICD-10-CM | POA: Diagnosis not present

## 2020-05-23 NOTE — Patient Instructions (Signed)
Medication Instructions:  Continue current medications  *If you need a refill on your cardiac medications before your next appointment, please call your pharmacy*   Lab Work: CBC, CMP, TSH, HgB A1C and Fasting lipids  If you have labs (blood work) drawn today and your tests are completely normal, you will receive your results only by: Marland Kitchen MyChart Message (if you have MyChart) OR . A paper copy in the mail If you have any lab test that is abnormal or we need to change your treatment, we will call you to review the results.   Testing/Procedures: Your physician has requested that you have an echocardiogram. Echocardiography is a painless test that uses sound waves to create images of your heart. It provides your doctor with information about the size and shape of your heart and how well your heart's chambers and valves are working. This procedure takes approximately one hour. There are no restrictions for this procedure.   Follow-Up: At [facility], you and your health needs are our priority.  As part of our continuing mission to provide you with exceptional heart care, we have created designated Provider Care Teams.  These Care Teams include your primary Cardiologist (physician) and Advanced Practice Providers (APPs -  Physician Assistants and Nurse Practitioners) who all work together to provide you with the care you need, when you need it.  We recommend signing up for the patient portal called "MyChart".  Sign up information is provided on this After Visit Summary.  MyChart is used to connect with patients for Virtual Visits (Telemedicine).  Patients are able to view lab/test results, encounter notes, upcoming appointments, etc.  Non-urgent messages can be sent to your provider as well.   To learn more about what you can do with MyChart, go to [email or web address].    Your next appointment:   6 month(s)  The format for your next appointment:   In Person  Provider:   You may see  Shelva Majestic, MD or one of the following Advanced Practice Providers on your designated Care Team:    Rosaria Ferries, PA-C  Jory Sims, DNP, ANP

## 2020-05-23 NOTE — Progress Notes (Signed)
Cardiology Office Note    Date:  05/23/2020   ID:  [name] [name], DOB 03-05-1955, MRN [id number]  PCP:  Kallie Locks, FNP  Cardiologist:  Nicki Guadalajara, MD   No chief complaint on file.  F/U ; initially referred through the courtesy of Dr. Vernie Ammons for preoperative clearance following demonstration of an abnormal ECG.  History of Present Illness:  [name] [name] is a 65 y.o. male who was initially referred in November 2018 for preoperative clearance by Dr. Vernie Ammons prior to undergoing urologic surgery.  I last saw him in March 2019.  He presents for 3-year follow-up evaluation.  [name] has a remote history of throat cancer and underwent chemotherapy and radiation therapy.  He has a history of hypertension, as well as long-standing tobaccouse.  He started smoking at age 40 and was smoking up to 3 packs per day for many years.  Recently, he is smoking one half pack per day.  He has a history of hypothyroidism and is on levothyroxine.  He has a history of hypertension, and most recently has been on amlodipine 10 mg, hydralazine 75 mg every 8 hours, and metoprolol 12.5 mg twice a day.  He denies any recent definitive chest pain but he does admit that he is unable to walk much due to right hip discomfort.  September, he been hospitalized with left flank pain.  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  He underwent left percutaneous nephrostomy.  He is in need for additional surgery was initially scheduled to be October 22, but when he presented on October 19 for preoperative clearance his surgery was canceled after his ECG revealed anterolateral T-wave changes.    When I saw him for initial cardiology evaluation I scheduled him for 2D echo Doppler study to further evaluate his systolic murmur as well as systolic and diastolic function with his history of hypertension on a 3 drug regimen.  I also schedule him for a nuclear study to assess potential ischemic etiology to his  ECG changes.  His echo Doppler study showed an EF of 55-60%.  He had normal diastolic parameters.  There was mild mitral regurgitation.  His nuclear stress test was abnormal and interpreted as high risk demonstrating a medium defect of moderate severity in the mid anteroseptal, apical anterior, apical septal and apical location with mild peri-infarction ischemia.  EF was 49%.   On 01/18/2017.  I performed cardiac catheterization which showed preserved LV function with an EF of 55-60% with very subtle mid anterolateral and mid inferior hypocontractility.  He was found to have multivessel CAD with a long subtotal LAD stenosis after the first diagonal and septal vessels, but with significant collateralization arising from the diagonal vessel to the apical LAD leading to retrograde filling of the LAD distally approximately as well as diffuse 90% distal LAD stenosis.  There was a 40% ostial proximal OM1 stenosis, 30% on 2 stenosis and he had a large dominant RCA with focal 85% stenosis distally in the continuation branch.  After the PDA vessel.  Since Mr. Costella Hatcher did not have any chest pain and his ECG showed T-wave abnormalities consistent with a subtotal LAD stenosis which was collateralized.  At that time, I gave him clearance for surgery and did not perform any intervention so as to avoid preoperative dual antiplatelet therapy.  He saw Azalee Course, PA in the office in follow-up and was started on Lipitor 20 g daily. He tolerated his urologic surgery successfully without  cardiovascular compromise.    I last saw him in March 2019 at which time he was cardiac stable without chest pain, PND orthopnea.  Unfortunately he was continued to smoke cigarettes but had reduced his smoking from 3 packs/day to 1/2 pack/day.    Presently, he continues to smoke.  He has a history of throat cancer.  He is retired as a Nutritional therapist and retired at age 17.  He has noticed shortness of breath with activity.  He denies palpitations or chest  pain.  He has been on amlodipine 10 mg, isosorbide 30 mg, metoprolol tartrate 25 mg twice a day for hypertension and CAD.  He is on levothyroxine 137 mcg for hypothyroidism.  He is on Symbicort for COPD.  He presents for evaluation.  Past Medical History:  Diagnosis Date  . Arthritis   . Chronic right ear pain 07/2019  . Coronary artery disease   . Depression 06/12/11   Buproprion per Dr. [name]  . Drainage from ear, right 07/2019  . Dyspnea   . Full dentures    Fitting Per Dr. Kristin Bruins  . Heart murmur   . History of kidney stones   . Hypertension   . Hypothyroidism   . Oropharyngeal cancer (HCC) 2012   throat  . Protein calorie malnutrition (HCC) 05/31/11  . Renal insufficiency 2012   Secondary to Hx. of Cisplatin and Dhydrtion  . Skin rash 01/2019  . Smoking 06/12/11   1/2 pack/day  . Status post chemotherapy 10/30/10 - 11/09/10    2 doses of Q 3 week Cisplatin  . Status post radiation therapy 10/11/10 - 12/24/10   Bilateral Neck and Mucosa axis /  70 gray in 35 fractions  . Xerostomia     Past Surgical History:  Procedure Laterality Date  . APPENDECTOMY    . BIOPSY TONGUE     TonsilandBaseoftongue  . HERNIA REPAIR     umbilical with mesh  . IR NEPHROSTOMY EXCHANGE LEFT  01/10/2017  . IR NEPHROSTOMY PLACEMENT LEFT  11/15/2016  . LEFT HEART CATH AND CORONARY ANGIOGRAPHY N/A 01/18/2017   Procedure: LEFT HEART CATH AND CORONARY ANGIOGRAPHY;  Surgeon: Lennette Bihari, MD;  Location: [facility];  Service: Cardiovascular;  Laterality: N/A;  . NEPHROLITHOTOMY Left 02/11/2017   Procedure: NEPHROLITHOTOMY PERCUTANEOUS;  Surgeon: Ihor Gully, MD;  Location: [facility];  Service: Urology;  Laterality: Left;  . PEG TUBE REMOVAL  04/13/11  . TONSILLECTOMY     right side    Current Medications: Outpatient Medications Prior to Visit  Medication Sig Dispense Refill  . albuterol (VENTOLIN HFA) 108 (90 Base) MCG/ACT inhaler Inhale 2 puffs into the lungs every 4 (four) hours as needed  for wheezing or shortness of breath (cough, shortness of breath or wheezing.). 6.7 each 11  . amLODipine (NORVASC) 10 MG tablet Take 1 tablet (10 mg total) by mouth daily. 90 tablet 3  . atorvastatin (LIPITOR) 40 MG tablet Take 1 tablet (40 mg total) by mouth daily. 90 tablet 3  . budesonide-formoterol (SYMBICORT) 80-4.5 MCG/ACT inhaler Inhale 2 puffs into the lungs 2 (two) times daily. 6.9 each 11  . hydrALAZINE (APRESOLINE) 25 MG tablet Take 2 tablets (50 mg total) by mouth every 8 (eight) hours. 180 tablet 3  . hydrocortisone cream 1 % Apply 1 application topically 2 (two) times daily. 30 g 6  . isosorbide mononitrate (IMDUR) 30 MG 24 hr tablet Take 1 tablet (30 mg total) by mouth daily. 90 tablet 3  . levothyroxine (SYNTHROID)  137 MCG tablet Take 1 tablet (137 mcg total) by mouth daily before breakfast. 90 tablet 3  . metoprolol tartrate (LOPRESSOR) 25 MG tablet Take 1 tablet (25 mg total) by mouth 2 (two) times daily. 180 tablet 3  . Vitamin D, Ergocalciferol, (DRISDOL) 1.25 MG (50000 UNIT) CAPS capsule Take 1 capsule (50,000 Units total) by mouth every 7 (seven) days. 5 capsule 6  . tamsulosin (FLOMAX) 0.4 MG CAPS capsule Take 1 capsule (0.4 mg total) by mouth daily. (Patient not taking: Reported on 05/23/2020) 90 capsule 3   No facility-administered medications prior to visit.     Allergies:   Codeine   Social History   Socioeconomic History  . Marital status: Single    Spouse name: Not on file  . Number of children: Not on file  . Years of education: Not on file  . Highest education level: Not on file  Occupational History  . Not on file  Tobacco Use  . Smoking status: Current Every Day Smoker    Packs/day: 0.25    Years: 25.00    Pack years: 6.25    Types: Cigarettes  . Smokeless tobacco: Never Used  . Tobacco comment: Smoking 4-5 cigarettes daily on average  Vaping Use  . Vaping Use: Never used  Substance and Sexual Activity  . Alcohol use: No    Alcohol/week: 0.0  standard drinks  . Drug use: No  . Sexual activity: Not Currently  Other Topics Concern  . Not on file  Social History Narrative  . Not on file   Social Determinants of Health   Financial Resource Strain: Not on file  Food Insecurity: Not on file  Transportation Needs: Not on file  Physical Activity: Not on file  Stress: Not on file  Social Connections: Not on file    Additional social history is notable that he is divorced and lives with one of his daughters.  He has 3 children and 6 grandchildren.  He worked for [employer] but is retired.there is a long-standing tobacco history since age 53. He does not exercise.  Family History:  The patient's family history includes Cancer (age of onset: 71) in his sister; Cancer (age of onset: 65) in his father.  His mother died at age 24.  Father died at age 19 and had diabetes and CAD.  He has 2 brothers, 2 living sisters, and one sister who died at age 34 with some cancer of her blood.  He has 3 children.  ROS General: Negative; No fevers, chills, or night sweats;  HEENT: Negative; No changes in vision or hearing, sinus congestion; history of throat cancer Pulmonary: Negative; No cough, wheezing, shortness of breath, hemoptysis Cardiovascular: Negative; No chest pain, presyncope, syncope, palpitations GI: Negative; No nausea, vomiting, diarrhea, or abdominal pain GU: h/o left-sided flank pain, kidney stones, and hydronephrosis; that is post urology surgery and removal of his kidney stone. Musculoskeletal: hip discomfort Hematologic/Oncology: Negative; no easy bruising, bleeding Endocrine: Negative; no heat/cold intolerance; no diabetes Neuro: Negative; no changes in balance, headaches Skin: Negative; No rashes or skin lesions Psychiatric: Negative; No behavioral problems, depression Sleep: Negative; No snoring, daytime sleepiness, hypersomnolence, bruxism, restless legs, hypnogognic hallucinations, no cataplexy Other comprehensive 14  point system review is negative.   PHYSICAL EXAM:   VS:  BP 118/65   Pulse (!) 50   Ht 5\' 9"  (1.753 m)   Wt 163 lb 12.8 oz (74.3 kg)   SpO2 96%   BMI 24.19 kg/m  Repeat blood pressure by me was 120/664  Wt Readings from Last 3 Encounters:  05/23/20 163 lb 12.8 oz (74.3 kg)  01/02/20 164 lb (74.4 kg)  12/12/19 170 lb (77.1 kg)       Physical Exam BP 118/65   Pulse (!) 50   Ht 5\' 9"  (1.753 m)   Wt 163 lb 12.8 oz (74.3 kg)   SpO2 96%   BMI 24.19 kg/m  General: Alert, oriented, no distress.  Skin: normal turgor, no rashes, warm and dry HEENT: Normocephalic, atraumatic. Pupils equal round and reactive to light; sclera anicteric; extraocular muscles intact;  Nose without nasal septal hypertrophy Mouth/Parynx benign; Mallinpatti scale 3 Neck: No JVD, no carotid bruits; normal carotid upstroke Lungs: clear to ausculatation and percussion; no wheezing or rales Chest wall: without tenderness to palpitation Heart: PMI not displaced, RRR, s1 s2 normal, 1/6 systolic murmur, no diastolic murmur, no rubs, gallops, thrills, or heaves Abdomen: soft, nontender; no hepatosplenomehaly, BS+; abdominal aorta nontender and not dilated by palpation. Back: no CVA tenderness Pulses 2+ Musculoskeletal: full range of motion, normal strength, no joint deformities Extremities: no clubbing cyanosis or edema, Homan's sign negative  Neurologic: grossly nonfocal; Cranial nerves grossly wnl Psychologic: Normal mood and affect   Studies/Labs Reviewed:   EKG:  EKG is ordered today. ECG (independently read by me): Sinus bradycardia at 50     March 2019 ECG (independently read by me): Sinus rhythm with mild sinus arrhythmia at 86 bpm.  No significant ST-T changes.  QTc interval 476 ms.  PR interval 14 2 ms.  January 11, 2017 ECG (independently read by me): Normal sinus rhythm at 65 bpm.  Nonspecific T-wave abnormality anteriorly. Normal intervals.  12/21/2016 ECG (independently read by me):  Normal sinus rhythm at 63 bpm.  T-wave abnormality anteriorly V1 through V5.  Normal intervals.  No ectopy.  Recent Labs: BMP Latest Ref Rng & Units 12/12/2019 03/10/2018 05/25/2017  Glucose 70 - 99 mg/dL 269(S) 96 854(O)  BUN 8 - 23 mg/dL 19 14 16   Creatinine 0.61 - 1.24 mg/dL 2.70(J) 5.00(X) 3.81(W)  BUN/Creat Ratio 10 - 24 - 9(L) 9(L)  Sodium 135 - 145 mmol/L 140 146(H) 145(H)  Potassium 3.5 - 5.1 mmol/L 4.5 4.4 4.1  Chloride 98 - 111 mmol/L 111 108(H) 110(H)  CO2 22 - 32 mmol/L 20(L) 20 18(L)  Calcium 8.9 - 10.3 mg/dL 8.9 9.2 9.4     Hepatic Function Latest Ref Rng & Units 12/12/2019 03/10/2018 05/25/2017  Total Protein 6.5 - 8.1 g/dL 7.1 6.6 6.8  Albumin 3.5 - 5.0 g/dL 3.8 4.0 4.1  AST 15 - 41 U/L 17 17 14   ALT 0 - 44 U/L 27 20 13   Alk Phosphatase 38 - 126 U/L 94 110 99  Total Bilirubin 0.3 - 1.2 mg/dL 0.5 0.4 0.4    CBC Latest Ref Rng & Units 12/12/2019 03/10/2018 02/03/2017  WBC 4.0 - 10.5 K/uL 14.1(H) 10.3 9.1  Hemoglobin 13.0 - 17.0 g/dL 29.9 37.1 12.4(L)  Hematocrit 39.0 - 52.0 % 46.7 44.5 39.2  Platelets 150 - 400 K/uL 193 246 230   Lab Results  Component Value Date   MCV 83.5 12/12/2019   MCV 82 03/10/2018   MCV 83.1 02/03/2017   Lab Results  Component Value Date   TSH 1.240 05/08/2018   Lab Results  Component Value Date   HGBA1C 5.6 11/22/2017     BNP No results found for: BNP  ProBNP No results found for: PROBNP   Lipid Panel  Component Value Date/Time   CHOL 128 03/10/2018 1501   TRIG 159 (H) 03/10/2018 1501   HDL 25 (L) 03/10/2018 1501   CHOLHDL 5.1 (H) 03/10/2018 1501   CHOLHDL 6.2 (H) 01/03/2017 1108   VLDL 36 (H) 07/18/2015 1005   LDLCALC 71 03/10/2018 1501   LDLCALC 120 (H) 01/03/2017 1108     RADIOLOGY: No results found.   Additional studies/ records that were reviewed today include:   CATH ; 11/20/218  Post Atrio lesion is 85% stenosed.  Ost 1st Mrg lesion is 40% stenosed.  1st Mrg lesion is 40% stenosed.  Ost 2nd  Mrg to 2nd Mrg lesion is 30% stenosed.  Prox LAD to Mid LAD lesion is 95% stenosed.  Mid LAD to Dist LAD lesion is 90% stenosed.  The left ventricular systolic function is normal.  LV end diastolic pressure is normal.  The left ventricular ejection fraction is 55-65% by visual estimate.   Preserved global LV contractility with ejection fraction of 55-60% with very subtle mid anterolateral and mid inferior hypocontractility.  Multivessel CAD with long subtotal LAD stenosis after the first diagonal and septal perforating artery with significant collateralization arising from the diagonal vessel to the apical LAD with retrograde filling of the LAD distally to proximally and evidence for an additional diffuse 90% distal LAD stenosis; 40% ostial proximal OM1 stenosis, 30% OM 2 stenosis of the left circumflex coronary artery; and large dominant RCA with a focal 85% continuation branch stenosis after the PDA vessel.   RECOMMENDATION: Mr. Gavan Lougheed has not had any chest pain.  His ECG reveals anterolateral T wave abnormalities consistent with his subtotal LAD stenosis, which is collateralized extensively in a retrograde fashion from a large first diagonal vessel.  On his most recent nuclear study there was no ischemia in the inferior wall; however, he has a focal 85% continuation branch stenosis of his RCA after the PDA vessel and there are 2 small inferior LV branches and a posterior lateral vessel which arises distally beyond this stenosis.  Initially, the patient will be treated medically and I have contacted Dr. Vernie Ammons.  So as to avoid preoperative dual antiplatelet therapy plans can be made for his urologic surgery to remove his renal stone with plans for delayed PCI to his RCA postoperatively, when long-term antiplatelet therapy can be administered.    -------------------------------------------------------------------  ECHO Study Conclusions 12/30/2019  - Left ventricle: The cavity size  was normal. There was mild  concentric hypertrophy. Systolic function was normal. The  estimated ejection fraction was in the range of 55% to 60%. Wall  motion was normal; there were no regional wall motion  abnormalities. Left ventricular diastolic function parameters  were normal.  - Aortic valve: Transvalvular velocity was within the normal range.  There was no stenosis. There was no regurgitation.  - Mitral valve: Transvalvular velocity was within the normal range.  There was no evidence for stenosis. There was mild regurgitation.  - Left atrium: The atrium was mildly dilated.  - Right ventricle: The cavity size was normal. Wall thickness was  normal. Systolic function was normal.  - Atrial septum: No defect or patent foramen ovale was identified  by color flow Doppler.  - Tricuspid valve: There was no regurgitation   ASSESSMENT:    No diagnosis found.   PLAN:  Mr. Rajveer Hanlin is a 65 year old male who has a long-standing history of tobacco use, as well as a several year history of hypertension and hypothyroidism.    He also  is felt to have stage III chronic kidney disease.  He developed throat cancer and underwent chemotherapy and radiation and still smokes cigarettes.  When I saw him in 2018 he had developed kidney stones resulting  hydronephrosis requiring percutaneous nephrostomy.  As part of a preoperative evaluation for follow-up urology surgery T wave abnormality anteriorly on ECG preoperative testing.  His echo Doppler study revealed normal systolic function and normal diastolic parameters.  There was mild mitral regurgitation.  His nuclear study showed hypoperfusion involving the mid anteroseptal, apical anterior, apical septal, and apical locations.  There is mild peri-infarction ischemia.  As result, he underwent diagnostic catheterization.  In the office today I again reviewed his catheterization findings which revealed a subtotally stenosed LAD which was  his LAD well collateralized and this was responsible for the ischemia noted on nuclear imaging.  He does have a focal distal RCA stenosis beyond the PDA vessel in the continuation branch.  On medical therapy.  He has been asymptomatic.  At his last evaluation I further titrated his metoprolol and he continued to be on amlodipine as well as hydralazine.  His blood pressure today is stable on his regimen consisting of amlodipine 10 mg, hydralazine 50 mg every 8 hours, in addition to isosorbide 30 mg and metoprolol tartrate 25 mg twice a day.  He is not having any anginal symptomatology.  He has history of hypothyroidism and currently is on levothyroxine.  He is on atorvastatin 40 mg with target LDL less than 70.  Presently I have recommended he institute baby aspirin 81 mg since he has not been on aspirin therapy.  I am scheduling him for a follow-up echo Doppler study to reassess LV systolic and diastolic function.  I will check comprehensive laboratory in the fasting state including CMP, CBC, TSH, hemoglobin A1c and lipid studies.  I again stressed the importance of complete smoking cessation.  I will contact him regarding the results of the studies and as long as he is stable I will see him in 6 months for evaluation.   Medication Adjustments/Labs and Tests Ordered: Current medicines are reviewed at length with the patient today.  Concerns regarding medicines are outlined above.  Medication changes, Labs and Tests ordered today are listed in the Patient Instructions below. There are no Patient Instructions on file for this visit.   Signed, Nicki Guadalajara, MD  05/23/2020 11:18 AM    [facility] [address], [address], [place], [place]  [address] Phone: [phone]

## 2020-05-24 LAB — COMPREHENSIVE METABOLIC PANEL
ALT: 18 IU/L (ref 0–44)
AST: 16 IU/L (ref 0–40)
Albumin/Globulin Ratio: 1.5 (ref 1.2–2.2)
Albumin: 3.9 g/dL (ref 3.8–4.8)
Alkaline Phosphatase: 109 IU/L (ref 44–121)
BUN/Creatinine Ratio: 10 (ref 10–24)
BUN: 14 mg/dL (ref 8–27)
Bilirubin Total: 0.3 mg/dL (ref 0.0–1.2)
CO2: 18 mmol/L — ABNORMAL LOW (ref 20–29)
Calcium: 8.8 mg/dL (ref 8.6–10.2)
Chloride: 107 mmol/L — ABNORMAL HIGH (ref 96–106)
Creatinine, Ser: 1.41 mg/dL — ABNORMAL HIGH (ref 0.76–1.27)
Globulin, Total: 2.6 g/dL (ref 1.5–4.5)
Glucose: 91 mg/dL (ref 65–99)
Potassium: 4.2 mmol/L (ref 3.5–5.2)
Sodium: 142 mmol/L (ref 134–144)
Total Protein: 6.5 g/dL (ref 6.0–8.5)
eGFR: 55 mL/min/{1.73_m2} — ABNORMAL LOW (ref >59)

## 2020-05-24 LAB — LIPID PANEL
Chol/HDL Ratio: 4.1 ratio (ref 0.0–5.0)
Cholesterol, Total: 118 mg/dL (ref 100–199)
HDL: 29 mg/dL — ABNORMAL LOW (ref >39)
LDL Chol Calc (NIH): 68 mg/dL (ref 0–99)
Triglycerides: 116 mg/dL (ref 0–149)
VLDL Cholesterol Cal: 21 mg/dL (ref 5–40)

## 2020-05-24 LAB — TSH: TSH: 0.073 u[IU]/mL — ABNORMAL LOW (ref 0.450–4.500)

## 2020-05-24 LAB — CBC
Hematocrit: 40.5 % (ref 37.5–51.0)
Hemoglobin: 13.1 g/dL (ref 13.0–17.7)
MCH: 25.6 pg — ABNORMAL LOW (ref 26.6–33.0)
MCHC: 32.3 g/dL (ref 31.5–35.7)
MCV: 79 fL (ref 79–97)
Platelets: 200 10*3/uL (ref 150–450)
RBC: 5.12 x10E6/uL (ref 4.14–5.80)
RDW: 14.4 % (ref 11.6–15.4)
WBC: 10.6 10*3/uL (ref 3.4–10.8)

## 2020-05-24 LAB — HEMOGLOBIN A1C
Est. average glucose Bld gHb Est-mCnc: 114 mg/dL
Hgb A1c MFr Bld: 5.6 % (ref 4.8–5.6)

## 2020-05-25 ENCOUNTER — Encounter: Payer: Self-pay | Admitting: Cardiovascular Disease

## 2020-06-18 ENCOUNTER — Other Ambulatory Visit: Payer: Self-pay

## 2020-06-18 ENCOUNTER — Other Ambulatory Visit: Payer: Self-pay | Admitting: Nurse Practitioner

## 2020-06-18 ENCOUNTER — Ambulatory Visit (HOSPITAL_COMMUNITY): Payer: Medicare Other | Attending: Cardiovascular Disease

## 2020-06-18 DIAGNOSIS — R0602 Shortness of breath: Secondary | ICD-10-CM | POA: Diagnosis not present

## 2020-06-18 DIAGNOSIS — I2510 Atherosclerotic heart disease of native coronary artery without angina pectoris: Secondary | ICD-10-CM | POA: Diagnosis not present

## 2020-06-18 DIAGNOSIS — I10 Essential (primary) hypertension: Secondary | ICD-10-CM

## 2020-06-18 LAB — ECHOCARDIOGRAM COMPLETE
Area-P 1/2: 2.71 cm2
S' Lateral: 3.1 cm

## 2020-06-18 MED ORDER — HYDRALAZINE HCL 25 MG PO TABS
ORAL_TABLET | ORAL | 3 refills | Status: DC
  Filled 2020-06-18: qty 180, 30d supply, fill #0
  Filled 2020-07-29: qty 180, 30d supply, fill #1
  Filled 2020-09-10: qty 180, 30d supply, fill #2
  Filled 2020-10-21: qty 180, 30d supply, fill #3

## 2020-06-20 ENCOUNTER — Other Ambulatory Visit: Payer: Self-pay

## 2020-06-23 ENCOUNTER — Other Ambulatory Visit: Payer: Self-pay

## 2020-07-01 ENCOUNTER — Ambulatory Visit: Payer: Self-pay | Admitting: Family Medicine

## 2020-07-02 ENCOUNTER — Other Ambulatory Visit: Payer: Self-pay

## 2020-07-02 MED FILL — Atorvastatin Calcium Tab 40 MG (Base Equivalent): ORAL | 90 days supply | Qty: 90 | Fill #0 | Status: AC

## 2020-07-02 MED FILL — Isosorbide Mononitrate Tab ER 24HR 30 MG: ORAL | 90 days supply | Qty: 90 | Fill #0 | Status: AC

## 2020-07-02 MED FILL — Amlodipine Besylate Tab 10 MG (Base Equivalent): ORAL | 90 days supply | Qty: 90 | Fill #0 | Status: AC

## 2020-07-03 ENCOUNTER — Telehealth: Payer: Self-pay

## 2020-07-03 ENCOUNTER — Other Ambulatory Visit: Payer: Self-pay

## 2020-07-03 NOTE — Telephone Encounter (Signed)
Tried to call pt x2

## 2020-07-03 NOTE — Telephone Encounter (Signed)
Pt called and said someone has called him.

## 2020-07-04 ENCOUNTER — Other Ambulatory Visit: Payer: Self-pay | Admitting: Nurse Practitioner

## 2020-07-04 ENCOUNTER — Other Ambulatory Visit: Payer: Self-pay

## 2020-07-04 MED ORDER — LEVOTHYROXINE SODIUM 125 MCG PO TABS
125.0000 ug | ORAL_TABLET | Freq: Every day | ORAL | 4 refills | Status: DC
  Filled 2020-07-04: qty 90, 90d supply, fill #0

## 2020-07-04 MED ORDER — LEVOTHYROXINE SODIUM 125 MCG PO TABS
125.0000 ug | ORAL_TABLET | Freq: Every day | ORAL | 3 refills | Status: DC
  Filled 2020-07-04: qty 90, 90d supply, fill #0

## 2020-07-04 NOTE — Progress Notes (Signed)
   [name] [name] [name], [name]  [address] Phone:  [phone]   Fax:  [phone]  05/23/2020 TSH 0.073 uIU/ml completed by cardiology Decrease Synthroid 125 mcg daily

## 2020-07-10 ENCOUNTER — Other Ambulatory Visit: Payer: Self-pay

## 2020-07-10 ENCOUNTER — Ambulatory Visit (INDEPENDENT_AMBULATORY_CARE_PROVIDER_SITE_OTHER): Payer: Medicare Other | Admitting: Nurse Practitioner

## 2020-07-10 ENCOUNTER — Encounter: Payer: Self-pay | Admitting: Nurse Practitioner

## 2020-07-10 VITALS — BP 124/64 | HR 63 | Ht 69.0 in | Wt 164.0 lb

## 2020-07-10 DIAGNOSIS — I2510 Atherosclerotic heart disease of native coronary artery without angina pectoris: Secondary | ICD-10-CM | POA: Diagnosis not present

## 2020-07-10 DIAGNOSIS — E039 Hypothyroidism, unspecified: Secondary | ICD-10-CM

## 2020-07-10 DIAGNOSIS — I10 Essential (primary) hypertension: Secondary | ICD-10-CM

## 2020-07-10 DIAGNOSIS — F32A Depression, unspecified: Secondary | ICD-10-CM

## 2020-07-10 DIAGNOSIS — F17209 Nicotine dependence, unspecified, with unspecified nicotine-induced disorders: Secondary | ICD-10-CM

## 2020-07-10 DIAGNOSIS — E559 Vitamin D deficiency, unspecified: Secondary | ICD-10-CM | POA: Diagnosis not present

## 2020-07-10 DIAGNOSIS — F331 Major depressive disorder, recurrent, moderate: Secondary | ICD-10-CM

## 2020-07-10 MED ORDER — MIRTAZAPINE 15 MG PO TBDP
15.0000 mg | ORAL_TABLET | Freq: Every day | ORAL | 3 refills | Status: DC
  Filled 2020-07-10: qty 90, 90d supply, fill #0

## 2020-07-10 MED ORDER — VITAMIN D (ERGOCALCIFEROL) 1.25 MG (50000 UNIT) PO CAPS
ORAL_CAPSULE | ORAL | 6 refills | Status: DC
  Filled 2020-07-10: qty 5, 35d supply, fill #0
  Filled 2020-08-18: qty 5, 35d supply, fill #1
  Filled 2020-09-29: qty 5, 35d supply, fill #2
  Filled 2020-10-21 – 2020-12-01 (×3): qty 5, 35d supply, fill #3
  Filled 2021-01-06: qty 5, 35d supply, fill #4
  Filled 2021-02-10: qty 10, 70d supply, fill #5

## 2020-07-10 NOTE — Progress Notes (Signed)
[address], [place]  [address] Phone:  [phone]   Fax:  [phone]   Established Patient Office Visit  Subjective:  Patient ID: [name] [name], male    DOB: Jul 07, 1955  Age: 65 y.o. MRN: [id number]  CC:  Chief Complaint  Patient presents with  . Follow-up    6 month follow up, no questions or concerns     HPI [name] [name] presents for follow up. A former patient of NP [place].  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   He reports that he is depressed. This has been ongoing for 2 years since his wife died.   He is following up with cardiology. He was seen about 6 weeks ago. He did have lab work completed.   Denies headache, dizziness, visual changes, shortness of breath, dyspnea on exertion, chest pain, nausea, vomiting or any edema.   His appetite is low because he is not able to taste due to the radiation years ago.   Past Medical History:  Diagnosis Date  . Arthritis   . Chronic right ear pain 07/2019  . Coronary artery disease   . Depression 06/12/11   Buproprion per Dr. Lamonte Sakai  . Drainage from ear, right 07/2019  . Dyspnea   . Full dentures    Fitting Per Dr. Enrique Sack  . Heart murmur   . History of kidney stones   . Hypertension   . Hypothyroidism   . 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 Protein calorie malnutrition ([place]) 05/31/11  . Renal insufficiency 2012   Secondary to Hx. of Cisplatin and Dhydrtion  . Skin rash 01/2019  . Smoking 06/12/11    1/2 pack/day  . Status post chemotherapy 10/30/10 - 11/09/10    2 doses of Q 3 week Cisplatin  . Status post radiation therapy 10/11/10 - 12/24/10   Bilateral Neck and Mucosa axis /  70 gray in 35 fractions  . Xerostomia     Past Surgical History:  Procedure Laterality Date  . APPENDECTOMY    . BIOPSY TONGUE     TonsilandBaseoftongue  . HERNIA REPAIR     umbilical with mesh  . IR NEPHROSTOMY EXCHANGE LEFT  01/10/2017  . IR NEPHROSTOMY PLACEMENT LEFT  11/15/2016  . LEFT HEART CATH AND CORONARY ANGIOGRAPHY N/A 01/18/2017   Procedure: LEFT HEART CATH AND CORONARY ANGIOGRAPHY;  Surgeon: [name], MD;  Location: [facility];  Service: Cardiovascular;  Laterality: N/A;  . NEPHROLITHOTOMY Left 02/11/2017   Procedure: NEPHROLITHOTOMY PERCUTANEOUS;  Surgeon: [name], MD;  Location: [facility];  Service: Urology;  Laterality: Left;  . PEG TUBE REMOVAL  04/13/11  . TONSILLECTOMY     right side    Family History  Problem Relation Age of Onset  . Cancer Father 55       colon  . Cancer Sister 63       leukemia    Social History   Socioeconomic History  . Marital status: Single    Spouse name: Not on file  . Number of children: Not on file  . Years  of education: Not on file  . Highest education level: Not on file  Occupational History  . Not on file  Tobacco Use  . Smoking status: Current Every Day Smoker    Packs/day: 0.50    Years: 25.00    Pack years: 12.50    Types: Cigarettes  . Smokeless tobacco: Never Used  . Tobacco comment: Smoking 4-5 cigarettes daily on average  Vaping Use  . Vaping Use: Never used  Substance and Sexual Activity  . Alcohol use: No    Alcohol/week: 0.0 standard drinks  . Drug use: No  . Sexual activity: Not Currently  Other Topics Concern  . Not on file  Social History Narrative  . Not on file   Social Determinants of Health   Financial Resource Strain: Not on file  Food Insecurity: Not on file  Transportation Needs: Not on  file  Physical Activity: Not on file  Stress: Not on file  Social Connections: Not on file  Intimate Partner Violence: Not on file    Outpatient Medications Prior to Visit  Medication Sig Dispense Refill  . albuterol (VENTOLIN HFA) 108 (90 Base) MCG/ACT inhaler INHALE 2 PUFFS INTO THE LUNGS EVERY 4 (FOUR) HOURS AS NEEDED FOR WHEEZING OR SHORTNESS OF BREATH (COUGH, SHORTNESS OF BREATH OR WHEEZING.). 18 g 11  . amLODipine (NORVASC) 10 MG tablet TAKE 1 TABLET (10 MG TOTAL) BY MOUTH DAILY. 90 tablet 3  . atorvastatin (LIPITOR) 40 MG tablet TAKE 1 TABLET (40 MG TOTAL) BY MOUTH DAILY. 90 tablet 3  . budesonide-formoterol (SYMBICORT) 80-4.5 MCG/ACT inhaler INHALE 2 PUFFS INTO THE LUNGS 2 (TWO) TIMES DAILY. 10.2 g 11  . hydrALAZINE (APRESOLINE) 25 MG tablet TAKE 2 TABLETS (50 MG TOTAL) BY MOUTH EVERY 8 (EIGHT) HOURS. 180 tablet 3  . hydrocortisone cream 1 % Apply 1 application topically 2 (two) times daily. 30 g 6  . isosorbide mononitrate (IMDUR) 30 MG 24 hr tablet TAKE 1 TABLET (30 MG TOTAL) BY MOUTH DAILY. 90 tablet 3  . levothyroxine (SYNTHROID) 125 MCG tablet Take 1 tablet (125 mcg total) by mouth daily before breakfast. 90 tablet 3  . metoprolol tartrate (LOPRESSOR) 25 MG tablet TAKE 1 TABLET (25 MG TOTAL) BY MOUTH 2 (TWO) TIMES DAILY. 180 tablet 3  . Vitamin D, Ergocalciferol, (DRISDOL) 1.25 MG (50000 UNIT) CAPS capsule TAKE 1 CAPSULE (50,000 UNITS TOTAL) BY MOUTH EVERY 7 (SEVEN) DAYS. 5 capsule 6   No facility-administered medications prior to visit.    Allergies  Allergen Reactions  . Codeine Nausea Only    ROS Review of Systems    Objective:    Physical Exam Constitutional:      General: He is not in acute distress. HENT:     Head: Normocephalic and atraumatic.     Nose: Nose normal.     Mouth/Throat:     Mouth: Mucous membranes are moist.     Comments: No teeth Cardiovascular:     Pulses: Normal pulses.     Heart sounds: Normal heart sounds.  Pulmonary:     Effort:  Pulmonary effort is normal.     Comments: Diminished in bilateral bases Abdominal:     General: Bowel sounds are normal.     Palpations: Abdomen is soft.  Musculoskeletal:        General: Normal range of motion.  Skin:    General: Skin is warm and dry.     Capillary Refill: Capillary refill takes less than 2 seconds.  Neurological:  General: No focal deficit present.     Mental Status: He is alert and oriented to person, place, and time.  Psychiatric:        Mood and Affect: Mood normal.        Behavior: Behavior normal.        Thought Content: Thought content normal.        Judgment: Judgment normal.     BP 124/64 (BP Location: Right Arm, Patient Position: Sitting, Cuff Size: Small)   Pulse 63   Ht _0  (1.753 m)   Wt 164 lb 0.4 oz (74.4 kg)   SpO2 97%   BMI 24.22 kg/m  Wt Readings from Last 3 Encounters:  07/12/20 164 lb (74.4 kg)  07/10/20 164 lb 0.4 oz (74.4 kg)  05/23/20 163 lb 12.8 oz (74.3 kg)     Health Maintenance Due  Topic Date Due  . COVID-19 Vaccine (1) Never done  . COLONOSCOPY (Pts 45-55yr Insurance coverage will need to be confirmed)  Never done  . PNA vac Low Risk Adult (1 of 2 - PCV13) 12/25/2019    There are no preventive care reminders to display for this patient.  Lab Results  Component Value Date   TSH 0.073 (L) 05/23/2020   Lab Results  Component Value Date   WBC 9.7 07/12/2020   HGB 12.3 (L) 07/12/2020   HCT 40.1 07/12/2020   MCV 80.5 07/12/2020   PLT 187 07/12/2020   Lab Results  Component Value Date   NA 140 07/12/2020   K 3.9 07/12/2020   CO2 20 (L) 07/12/2020   GLUCOSE 123 (H) 07/12/2020   BUN 19 07/12/2020   CREATININE 1.61 (H) 07/12/2020   BILITOT 0.5 07/12/2020   ALKPHOS 89 07/12/2020   AST 16 07/12/2020   ALT 22 07/12/2020   PROT 6.7 07/12/2020   ALBUMIN 3.6 07/12/2020   CALCIUM 9.0 07/12/2020   ANIONGAP 7 07/12/2020   EGFR 55 (L) 05/23/2020   Lab Results  Component Value Date   CHOL 118 05/23/2020    Lab Results  Component Value Date   HDL 29 (L) 05/23/2020   Lab Results  Component Value Date   LDLCALC 68 05/23/2020   Lab Results  Component Value Date   TRIG 116 05/23/2020   Lab Results  Component Value Date   CHOLHDL 4.1 05/23/2020   Lab Results  Component Value Date   HGBA1C 5.6 05/23/2020      Assessment & Plan:   Problem List Items Addressed This Visit      Cardiovascular and Mediastinum   Essential hypertension - Primary Stable followed by cardiology will continue with current regiment     Endocrine   Hypothyroidism Decreased levothyroxine 125 mcg daily will reevaluate at next visit (labs completed in cardiology)     Other   Vitamin D deficiency Stable will continue with treatment due to history   Relevant Medications   Vitamin D, Ergocalciferol, (DRISDOL) 1.25 MG (50000 UNIT) CAPS capsule    Other Visit Diagnoses    Tobacco use disorder, continuous     Discussed    Coronary artery disease without angina pectoris, unspecified vessel or lesion type, unspecified whether native or transplanted heart     Stable will continue to follow up with cardiology      Relevant Medications   mirtazapine (REMERON SOL-TAB) 15 MG disintegrating tablet   Moderate episode of recurrent major depressive disorder ([place]     Ongoing will trial Mirtazapine 15 mg for depression and low  appetite to improve outcomes in both       Relevant Medications   mirtazapine (REMERON SOL-TAB) 15 MG disintegrating tablet      Meds ordered this encounter  Medications  . mirtazapine (REMERON SOL-TAB) 15 MG disintegrating tablet    Sig: Take 1 tablet (15 mg total) by mouth at bedtime.    Dispense:  90 tablet    Refill:  3    Order Specific Question:   Supervising Provider    Answer:   Tresa Garter [id number]  . Vitamin D, Ergocalciferol, (DRISDOL) 1.25 MG (50000 UNIT) CAPS capsule    Sig: TAKE 1 CAPSULE (50,000 UNITS TOTAL) BY MOUTH EVERY 7 (SEVEN) DAYS.    Dispense:  5  capsule    Refill:  6    Order Specific Question:   Supervising Provider    Answer:   Tresa Garter [[id number]]    Follow-up: Return in about 6 weeks (around 08/21/2020) for Follow-up for depression 992[address].    Vevelyn Francois, NP

## 2020-07-10 NOTE — Patient Instructions (Signed)
Major Depressive Disorder, Adult Major depressive disorder is a mental health condition. This disorder affects feelings. It can also affect the body. Symptoms of this condition last most of the day, almost every day, for 2 weeks. This disorder can affect:  Relationships.  Daily activities, such as work and school.  Activities that you normally like to do. What are the causes? The cause of this condition is not known. The disorder is likely caused by a mix of things, including:  Your personality, such as being a shy person.  Your behavior, or how you act toward others.  Your thoughts and feelings.  Too much alcohol or drugs.  How you react to stress.  Health and mental problems that you have had for a long time.  Things that hurt you in the past (trauma).  Big changes in your life, such as divorce. What increases the risk? The following factors may make you more likely to develop this condition:  Having family members with depression.  Being a woman.  Problems in the family.  Low levels of some brain chemicals.  Things that caused you pain as a child, especially if you lost a parent or were abused.  A lot of stress in your life, such as from: ? Living without basic needs of life, such as food and shelter. ? Being treated poorly because of race, sex, or religion (discrimination).  Health and mental problems that you have had for a long time. What are the signs or symptoms? The main symptoms of this condition are:  Being sad all the time.  Being grouchy all the time.  Loss of interest in things and activities. Other symptoms include:  Sleeping too much or too little.  Eating too much or too little.  Gaining or losing weight, without knowing why.  Feeling tired or having low energy.  Being restless and weak.  Feeling hopeless, worthless, or guilty.  Trouble thinking clearly or making decisions.  Thoughts of hurting yourself or others, or thoughts of  ending your life.  Spending a lot of time alone.  Inability to complete common tasks of daily life. If you have very bad MDD, you may:  Believe things that are not true.  Hear, see, taste, or feel things that are not there.  Have mild depression that lasts for at least 2 years.  Feel very sad and hopeless.  Have trouble speaking or moving. How is this treated? This condition may be treated with:  Talk therapy. This teaches you to know bad thoughts, feelings, and actions and how to change them. ? This can also help you to communicate with others. ? This can be done with members of your family.  Medicines. These can be used to treat worry (anxiety), depression, or low levels of chemicals in the brain.  Lifestyle changes. You may need to: ? Limit alcohol use. ? Limit drug use. ? Get regular exercise. ? Get plenty of sleep. ? Make healthy eating choices. ? Spend more time outdoors.  Brain stimulation. This treatment excites the brain. This is done when symptoms are very bad or have not gotten better with other treatments. Follow these instructions at home: Activity  Get regular exercise as told.  Spend time outdoors as told.  Make time to do the things you enjoy.  Find ways to deal with stress. Try to: ? Meditate. ? Do deep breathing. ? Spend time in nature. ? Keep a journal.  Return to your normal activities as told by your doctor.   Ask your doctor what activities are safe for you. Alcohol and drug use  If you drink alcohol: ? Limit how much you use to:  0-1 drink a day for women.  0-2 drinks a day for men. ? Be aware of how much alcohol is in your drink. In the [place] one drink equals one 12 oz bottle of beer (355 mL), one 5 oz glass of wine (148 mL), or one 1 oz glass of hard liquor (44 mL).  Talk to your doctor about: ? Alcohol use. Alcohol can affect some medicines. ? Any drug use. General instructions  Take over-the-counter and prescription medicines  and herbal preparations only as told by your doctor.  Eat a healthy diet.  Get a lot of sleep.  Think about joining a support group. Your doctor may be able to suggest one.  Keep all follow-up visits as told by your doctor. This is important.   Where to find more information:  National Alliance on Mental Illness: www.nami.org  [place] National Institute of Mental Health: www.nimh.nih.gov  American Psychiatric Association: www.psychiatry.org/patients-families/ Contact a doctor if:  Your symptoms get worse.  You get new symptoms. Get help right away if:  You hurt yourself.  You have serious thoughts about hurting yourself or others.  You see, hear, taste, smell, or feel things that are not there. If you ever feel like you may hurt yourself or others, or have thoughts about taking your own life, get help right away. Go to your nearest emergency department or:  Call your local emergency services (911 in the [place]  Call a suicide crisis helpline, such as the National Suicide Prevention Lifeline at 1-800-273-8255. This is open 24 hours a day in the [place]  Text the Crisis Text Line at 741741 (in the [place] Summary  Major depressive disorder is a mental health condition. This disorder affects feelings. Symptoms of this condition last most of the day, almost every day, for 2 weeks.  The symptoms of this disorder can cause problems with relationships and with daily activities.  There are treatments and support for people who get this disorder. You may need more than one type of treatment.  Get help right away if you have serious thoughts about hurting yourself or others. This information is not intended to replace advice given to you by your health care provider. Make sure you discuss any questions you have with your health care provider. Document Revised: 01/27/2019 Document Reviewed: 01/27/2019 Elsevier Patient Education  2021 [employer]vier Inc.  

## 2020-07-11 ENCOUNTER — Other Ambulatory Visit: Payer: Self-pay

## 2020-07-12 ENCOUNTER — Emergency Department (HOSPITAL_COMMUNITY): Payer: Medicare Other

## 2020-07-12 ENCOUNTER — Emergency Department (HOSPITAL_COMMUNITY)
Admission: EM | Admit: 2020-07-12 | Discharge: 2020-07-12 | Disposition: A | Discharge status: Home / Self Care | Payer: Medicare Other | Attending: Emergency Medicine | Admitting: Emergency Medicine

## 2020-07-12 ENCOUNTER — Other Ambulatory Visit: Payer: Self-pay

## 2020-07-12 ENCOUNTER — Encounter (HOSPITAL_COMMUNITY): Payer: Self-pay

## 2020-07-12 DIAGNOSIS — M47814 Spondylosis without myelopathy or radiculopathy, thoracic region: Secondary | ICD-10-CM | POA: Diagnosis not present

## 2020-07-12 DIAGNOSIS — Z85818 Personal history of malignant neoplasm of other sites of lip, oral cavity, and pharynx: Secondary | ICD-10-CM | POA: Diagnosis not present

## 2020-07-12 DIAGNOSIS — F17210 Nicotine dependence, cigarettes, uncomplicated: Secondary | ICD-10-CM | POA: Insufficient documentation

## 2020-07-12 DIAGNOSIS — N202 Calculus of kidney with calculus of ureter: Secondary | ICD-10-CM | POA: Insufficient documentation

## 2020-07-12 DIAGNOSIS — K6389 Other specified diseases of intestine: Secondary | ICD-10-CM | POA: Insufficient documentation

## 2020-07-12 DIAGNOSIS — I77819 Aortic ectasia, unspecified site: Secondary | ICD-10-CM | POA: Diagnosis not present

## 2020-07-12 DIAGNOSIS — N200 Calculus of kidney: Secondary | ICD-10-CM

## 2020-07-12 DIAGNOSIS — N1330 Unspecified hydronephrosis: Secondary | ICD-10-CM | POA: Diagnosis not present

## 2020-07-12 DIAGNOSIS — E039 Hypothyroidism, unspecified: Secondary | ICD-10-CM | POA: Diagnosis not present

## 2020-07-12 DIAGNOSIS — R109 Unspecified abdominal pain: Secondary | ICD-10-CM | POA: Diagnosis present

## 2020-07-12 DIAGNOSIS — I10 Essential (primary) hypertension: Secondary | ICD-10-CM | POA: Insufficient documentation

## 2020-07-12 DIAGNOSIS — K639 Disease of intestine, unspecified: Secondary | ICD-10-CM

## 2020-07-12 DIAGNOSIS — I708 Atherosclerosis of other arteries: Secondary | ICD-10-CM | POA: Diagnosis not present

## 2020-07-12 LAB — CBC WITH DIFFERENTIAL/PLATELET
Abs Immature Granulocytes: 0.03 10*3/uL (ref 0.00–0.07)
Basophils Absolute: 0 10*3/uL (ref 0.0–0.1)
Basophils Relative: 0 %
Eosinophils Absolute: 0.1 10*3/uL (ref 0.0–0.5)
Eosinophils Relative: 1 %
HCT: 40.1 % (ref 39.0–52.0)
Hemoglobin: 12.3 g/dL — ABNORMAL LOW (ref 13.0–17.0)
Immature Granulocytes: 0 %
Lymphocytes Relative: 8 %
Lymphs Abs: 0.8 10*3/uL (ref 0.7–4.0)
MCH: 24.7 pg — ABNORMAL LOW (ref 26.0–34.0)
MCHC: 30.7 g/dL (ref 30.0–36.0)
MCV: 80.5 fL (ref 80.0–100.0)
Monocytes Absolute: 0.6 10*3/uL (ref 0.1–1.0)
Monocytes Relative: 6 %
Neutro Abs: 8.1 10*3/uL — ABNORMAL HIGH (ref 1.7–7.7)
Neutrophils Relative %: 85 %
Platelets: 187 10*3/uL (ref 150–400)
RBC: 4.98 MIL/uL (ref 4.22–5.81)
RDW: 17.1 % — ABNORMAL HIGH (ref 11.5–15.5)
WBC: 9.7 10*3/uL (ref 4.0–10.5)
nRBC: 0 % (ref 0.0–0.2)

## 2020-07-12 LAB — COMPREHENSIVE METABOLIC PANEL
ALT: 22 U/L (ref 0–44)
AST: 16 U/L (ref 15–41)
Albumin: 3.6 g/dL (ref 3.5–5.0)
Alkaline Phosphatase: 89 U/L (ref 38–126)
Anion gap: 7 (ref 5–15)
BUN: 19 mg/dL (ref 8–23)
CO2: 20 mmol/L — ABNORMAL LOW (ref 22–32)
Calcium: 9 mg/dL (ref 8.9–10.3)
Chloride: 113 mmol/L — ABNORMAL HIGH (ref 98–111)
Creatinine, Ser: 1.61 mg/dL — ABNORMAL HIGH (ref 0.61–1.24)
GFR, Estimated: 47 mL/min — ABNORMAL LOW (ref >60)
Glucose, Bld: 123 mg/dL — ABNORMAL HIGH (ref 70–99)
Potassium: 3.9 mmol/L (ref 3.5–5.1)
Sodium: 140 mmol/L (ref 135–145)
Total Bilirubin: 0.5 mg/dL (ref 0.3–1.2)
Total Protein: 6.7 g/dL (ref 6.5–8.1)

## 2020-07-12 LAB — URINALYSIS, ROUTINE W REFLEX MICROSCOPIC
Bilirubin Urine: NEGATIVE
Glucose, UA: NEGATIVE mg/dL
Ketones, ur: NEGATIVE mg/dL
Leukocytes,Ua: NEGATIVE
Nitrite: NEGATIVE
Protein, ur: NEGATIVE mg/dL
RBC / HPF: >50 RBC/hpf — ABNORMAL HIGH (ref 0–5)
Specific Gravity, Urine: 1.015 (ref 1.005–1.030)
pH: 5 (ref 5.0–8.0)

## 2020-07-12 LAB — LIPASE, BLOOD: Lipase: 61 U/L — ABNORMAL HIGH (ref 11–51)

## 2020-07-12 MED ORDER — HYDROCODONE-ACETAMINOPHEN 5-325 MG PO TABS: 1.0000 | ORAL_TABLET | ORAL | 0 refills | Status: DC | PRN

## 2020-07-12 MED ORDER — FENTANYL CITRATE (PF) 100 MCG/2ML IJ SOLN
50.0000 ug | Freq: Once | INTRAMUSCULAR | Status: AC
  Administered 2020-07-12: 50 ug via INTRAVENOUS
  Filled 2020-07-12: qty 2

## 2020-07-12 MED ORDER — HYDROMORPHONE HCL 1 MG/ML IJ SOLN
1.0000 mg | Freq: Once | INTRAMUSCULAR | Status: AC
  Administered 2020-07-12: 1 mg via INTRAVENOUS
  Filled 2020-07-12: qty 1

## 2020-07-12 MED ORDER — SODIUM CHLORIDE 0.9 % IV BOLUS
1000.0000 mL | Freq: Once | INTRAVENOUS | Status: AC
  Administered 2020-07-12: 1000 mL via INTRAVENOUS

## 2020-07-12 NOTE — ED Triage Notes (Signed)
Patient reports that he has known kidney stones. Patient c/o right flank pain that radiates into the right lower abdomen since last night.  Patient states when he has a BM he has something from inside that comes out like a ball and is painful when he has a BM.

## 2020-07-12 NOTE — ED Notes (Signed)
Patient back from CT at this time

## 2020-07-12 NOTE — ED Notes (Signed)
Patient transported to CT 

## 2020-07-12 NOTE — ED Provider Notes (Signed)
I provided a substantive portion of the care of this patient.  I personally performed the entirety of the medical decision making for this encounter.    65 year old male presents with right-sided flank pain.  CT scan shows kidney stone.  Pain controlled here and will follow-up with urology   Lacretia Leigh, MD 07/12/20 1710

## 2020-07-12 NOTE — ED Provider Notes (Signed)
[facility] DEPT Provider Note   CSN: [id number] Arrival date & time: 07/12/20  1320     History Chief Complaint  Patient presents with  . Abdominal Pain    [name] [name] is a 65 y.o. male.  HPI 65 year old male with a history of CAD, depression, oropharyngeal cancer, hypothyroidism, hypertension presents to the ER with complaints of right-sided flank pain radiating into his abdomen which started last night.  He has a history of kidney stones and feels like he may have another stone.  He denies any dysuria or hematuria.  No difficulty urinating.  He also complains of painful bowel movements, he notices a mass comes out when he moves his bowels and he has to push it back in sometimes.  He denies any blood in his stool.  This has been ongoing for a year.  He denies any unintended weight loss, no night sweats.    Past Medical History:  Diagnosis Date  . Arthritis   . Chronic right ear pain 07/2019  . Coronary artery disease   . Depression 06/12/11   Buproprion per Dr. Lamonte Sakai  . Drainage from ear, right 07/2019  . Dyspnea   . Full dentures    Fitting Per Dr. Enrique Sack  . Heart murmur   . History of kidney stones   . Hypertension   . Hypothyroidism   . 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 Protein calorie malnutrition ([place]) 05/31/11  . Renal insufficiency 2012   Secondary to Hx. of Cisplatin and Dhydrtion  . Skin rash 01/2019  . Smoking 06/12/11   1/2 pack/day  . Status post chemotherapy 10/30/10 - 11/09/10    2 doses of Q 3 week Cisplatin  . Status post radiation therapy 10/11/10 - 12/24/10   Bilateral Neck and Mucosa axis /  70 gray in 35 fractions  . Xerostomia     Patient Active Problem List   Diagnosis Date Noted  . Abdominal aortic aneurysm (AAA) without rupture ([place]) 12/12/2019  . Drainage from right ear 08/01/2019  . Right non-suppurative otitis media 02/01/2019  . Skin rash 02/01/2019  . Itching 02/01/2019  . Essential hypertension  09/08/2018  . Grieving 09/08/2018  . Shortness of breath 09/08/2018  . Cough 09/08/2018  . Tobacco use 09/08/2018  . Vitamin D deficiency 09/08/2018  . Renal calculi 02/11/2017  . Abnormal nuclear stress test   . Nephrolithiasis 11/14/2016  . Hypothyroidism 06/23/2015  . Noncompliance with follow up and medications 06/23/2015  . Acute kidney injury superimposed on chronic kidney disease ([place]) 06/22/2015  . Left nephrolithiasis 06/22/2015  . Hydronephrosis, left 06/22/2015  . 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 Oropharyngeal cancer ([place])   . Renal insufficiency     Past Surgical History:  Procedure Laterality Date  . APPENDECTOMY    . BIOPSY TONGUE     TonsilandBaseoftongue  . HERNIA REPAIR     umbilical with mesh  . IR NEPHROSTOMY EXCHANGE LEFT  01/10/2017  . IR NEPHROSTOMY PLACEMENT LEFT  11/15/2016  . LEFT HEART CATH AND CORONARY ANGIOGRAPHY N/A 01/18/2017   Procedure: LEFT HEART CATH AND CORONARY ANGIOGRAPHY;  Surgeon: [name], MD;  Location: [facility];  Service: Cardiovascular;  Laterality: N/A;  . NEPHROLITHOTOMY Left 02/11/2017   Procedure: NEPHROLITHOTOMY PERCUTANEOUS;  Surgeon: [name], MD;  Location: [facility];  Service: Urology;  Laterality: Left;  . PEG TUBE REMOVAL  04/13/11  . TONSILLECTOMY     right side       Family History  Problem Relation Age of Onset  . Cancer Father 29       colon  . Cancer Sister 75       leukemia    Social History   Tobacco Use  . Smoking status: Current Every Day Smoker    Packs/day: 0.50    Years: 25.00    Pack years: 12.50    Types: Cigarettes  . Smokeless tobacco: Never Used  . Tobacco comment: Smoking 4-5 cigarettes daily on average  Vaping Use  . Vaping Use: Never used  Substance Use Topics  . Alcohol use: No    Alcohol/week: 0.0 standard  drinks  . Drug use: No    Home Medications Prior to Admission medications   Medication Sig Start Date End Date Taking? Authorizing Provider  HYDROcodone-acetaminophen (NORCO/VICODIN) 5-325 MG tablet Take 1 tablet by mouth every 4 (four) hours as needed. 07/12/20  Yes Garald Balding, PA-C  albuterol (VENTOLIN HFA) 108 (90 Base) MCG/ACT inhaler INHALE 2 PUFFS INTO THE LUNGS EVERY 4 (FOUR) HOURS AS NEEDED FOR WHEEZING OR SHORTNESS OF BREATH (COUGH, SHORTNESS OF BREATH OR WHEEZING.). 01/01/20 12/31/20  Azzie Glatter, FNP  amLODipine (NORVASC) 10 MG tablet TAKE 1 TABLET (10 MG TOTAL) BY MOUTH DAILY. 01/01/20 12/31/20  Azzie Glatter, FNP  atorvastatin (LIPITOR) 40 MG tablet TAKE 1 TABLET (40 MG TOTAL) BY MOUTH DAILY. 01/01/20 12/31/20  Azzie Glatter, FNP  budesonide-formoterol (SYMBICORT) 80-4.5 MCG/ACT inhaler INHALE 2 PUFFS INTO THE LUNGS 2 (TWO) TIMES DAILY. 01/01/20 12/31/20  Azzie Glatter, FNP  hydrALAZINE (APRESOLINE) 25 MG tablet TAKE 2 TABLETS (50 MG TOTAL) BY MOUTH EVERY 8 (EIGHT) HOURS. 06/18/20 06/18/21  Vevelyn Francois, NP  hydrocortisone cream 1 % Apply 1 application topically 2 (two) times daily. 01/01/20   Azzie Glatter, FNP  isosorbide mononitrate (IMDUR) 30 MG 24 hr tablet TAKE 1 TABLET (30 MG TOTAL) BY MOUTH DAILY. 01/01/20 12/31/20  Azzie Glatter, FNP  levothyroxine (SYNTHROID) 125 MCG tablet Take 1 tablet (125 mcg total) by mouth daily before breakfast. 07/04/20 07/04/21  Vevelyn Francois, NP  metoprolol tartrate (LOPRESSOR) 25 MG tablet TAKE 1 TABLET (25 MG TOTAL) BY MOUTH 2 (TWO) TIMES DAILY. 01/01/20 12/31/20  Azzie Glatter, FNP  mirtazapine (REMERON SOL-TAB) 15 MG disintegrating tablet Take 1 tablet (15 mg total) by mouth at bedtime. 07/10/20 07/10/21  Vevelyn Francois, NP  Vitamin D, Ergocalciferol, (DRISDOL) 1.25 MG (50000 UNIT) CAPS capsule TAKE 1 CAPSULE (50,000 UNITS TOTAL) BY MOUTH EVERY 7 (SEVEN) DAYS. 07/10/20 07/10/21  Vevelyn Francois, NP    Allergies     Codeine  Review of Systems   Review of Systems  Constitutional: Negative for chills and fever.  HENT: Negative for ear pain and sore throat.   Eyes: Negative for pain and visual disturbance.  Respiratory: Negative for cough and shortness of breath.   Cardiovascular: Negative for chest pain and palpitations.  Gastrointestinal: Negative for abdominal pain and vomiting.  Genitourinary: Positive for flank pain. Negative for decreased urine volume, difficulty urinating, dysuria, hematuria and urgency.  Musculoskeletal: Negative for arthralgias and back pain.  Skin: Negative for color change and rash.  Neurological: Negative for seizures, syncope and headaches.  All other systems reviewed and are negative.   Physical Exam Updated Vital Signs BP 118/82   Pulse Marland Kitchen)  59   Temp 97.7 F (36.5 C) (Oral)   Resp 16   Ht 5\' 9"  (1.753 m)   Wt 74.4 kg   SpO2 96%   BMI 24.22 kg/m   Physical Exam Vitals and nursing note reviewed.  Constitutional:      General: He is not in acute distress.    Appearance: He is well-developed. He is not ill-appearing, toxic-appearing or diaphoretic.  HENT:     Head: Normocephalic and atraumatic.  Eyes:     Conjunctiva/sclera: Conjunctivae normal.  Cardiovascular:     Rate and Rhythm: Normal rate and regular rhythm.     Heart sounds: No murmur heard.   Pulmonary:     Effort: Pulmonary effort is normal. No respiratory distress.     Breath sounds: Normal breath sounds.  Abdominal:     General: Abdomen is flat.     Palpations: Abdomen is soft.     Tenderness: There is no abdominal tenderness. There is right CVA tenderness.  Genitourinary:    Rectum: Normal. No mass or tenderness.     Comments: Rectal exam performed with RN at bedside.  No visible prolapses.  No visible hemorrhoids.  Good sphincter tone. Musculoskeletal:     Cervical back: Neck supple.  Skin:    General: Skin is warm and dry.  Neurological:     General: No focal deficit present.      Mental Status: He is alert.  Psychiatric:        Mood and Affect: Mood normal.        Behavior: Behavior normal.     ED Results / Procedures / Treatments   Labs (all labs ordered are listed, but only abnormal results are displayed) Labs Reviewed  CBC WITH DIFFERENTIAL/PLATELET - Abnormal; Notable for the following components:      Result Value   Hemoglobin 12.3 (*)    MCH 24.7 (*)    RDW 17.1 (*)    Neutro Abs 8.1 (*)    All other components within normal limits  COMPREHENSIVE METABOLIC PANEL - Abnormal; Notable for the following components:   Chloride 113 (*)    CO2 20 (*)    Glucose, Bld 123 (*)    Creatinine, Ser 1.61 (*)    GFR, Estimated 47 (*)    All other components within normal limits  LIPASE, BLOOD - Abnormal; Notable for the following components:   Lipase 61 (*)    All other components within normal limits  URINALYSIS, ROUTINE W REFLEX MICROSCOPIC - Abnormal; Notable for the following components:   Hgb urine dipstick MODERATE (*)    RBC / HPF >50 (*)    Bacteria, UA RARE (*)    All other components within normal limits    EKG None  Radiology CT Renal Stone Study  Result Date: 07/12/2020 CLINICAL DATA:  Right flank pain.  History of throat carcinoma EXAM: CT ABDOMEN AND PELVIS WITHOUT CONTRAST TECHNIQUE: Multidetector CT imaging of the abdomen and pelvis was performed following the standard protocol without oral or IV contrast. COMPARISON:  December 12, 2019 FINDINGS: Lower chest: Lung bases are clear. There are foci of coronary artery calcification. Hepatobiliary: No focal liver lesions are appreciable on this noncontrast enhanced study. Gallbladder wall is not appreciably thickened. There is no biliary duct dilatation. Pancreas: There is no appreciable pancreatic mass or inflammatory focus. Spleen: No splenic lesions are evident. Adrenals/Urinary Tract: There is stable apparent adrenal hypertrophy bilaterally. Renal contours are unchanged compared to prior study.  The right kidney  is mildly edematous. There is a 4 mm probable cyst in the posterior mid right kidney, stable. There is moderate hydronephrosis on the right. No appreciable hydronephrosis on the left. There is a calculus at the right ureterovesical junction measuring 6 x 5 mm. No other ureteral calculi are evident. Urinary bladder is midline with wall thickness within normal limits. Scattered foci of apparent vascular calcification in peripheral renal arterial branches. Stomach/Bowel: There are multiple sigmoid diverticula. Thickening of the wall of the mid sigmoid colon is a stable finding compared to the previous study, likely representing chronic muscular hypertrophy due to chronic diverticulosis. There is mild chronic mesenteric thickening in this area which could indicate a residua from prior diverticulitis. A degree of colitis in this area currently is possible. There are descending colonic diverticula without inflammatory changes. Elsewhere there is no appreciable bowel wall or mesenteric thickening. No bowel obstruction evident. Terminal ileum appears normal. No appendix visualized. By report, patient is status post appendectomy. No free air or portal venous air. Vascular/Lymphatic: There is aortic and iliac artery atherosclerosis. The aorta again shows a degree of multilobular fusiform dilatation. The maximum measured diameter of the aorta is seen inferior to the renal arteries, measuring 3.4 x 3.4 cm, compared to measured diameter of 3.3 x 3.3 cm on previous study. There is no periaortic fluid. There is iliac and femoral artery calcifications as well. There is no evident adenopathy in the abdomen or pelvis. Reproductive: Prostate is prominent. Prostate abuts the inferior bladder with loss of clear fat plane between the inferior bladder and prostate. Seminal vesicles appear unremarkable. Other: No abscess or ascites is appreciable in the abdomen or pelvis. Musculoskeletal: There are foci of degenerative  change in the thoracic spine. No blastic or lytic bone lesions evident. Suspected bone island at L4 anteriorly is stable. No intramuscular or abdominal wall lesions are evident. IMPRESSION: 1. Calculus at the right ureterovesical junction measuring 6 x 5 mm with moderate hydronephrosis on the right. Right kidney is mildly edematous. 2. Chronic thickening of the wall of the mid sigmoid colon with mild surrounding mesenteric soft tissue thickening. Suspect muscular hypertrophy from chronic diverticulosis. There may be chronic inflammation in this portion of the sigmoid colon as well. This finding may warrant direct visualization after appropriate colonic cleansing. It should be noted that underlying neoplasm causing chronic inflammation is a possibility with this appearance. No frank diverticulitis is seen. Note that there are descending colonic diverticula as well without colonic wall thickening or soft tissue inflammation. 3. No bowel obstruction. No abscess in the abdomen or pelvis. Appendix absent. 4. Aortic Atherosclerosis (ICD10-I70.0). There is multilobular fusiform dilatation in the mid aorta with a maximum diameter inferior to the renal arteries measuring 3.4 x 3.4 cm, compared to similar appearance with measured diameter of 3.3 x 3.3 cm on prior study. Recommend follow-up ultrasound every 3 years. This recommendation follows ACR consensus guidelines: White Paper of the ACR Incidental Findings Committee II on Vascular Findings. J Am Coll Radiol 2013; 10:789-794. 5. Prominent prostate with loss of clear fat plane between prostate and inferior bladder. Advise clinical assessment and PSA evaluation. 6. Stable appearing adrenals bilaterally with a degree of adrenal hypertrophy bilaterally. Clinical significance of this finding uncertain. Electronically Signed   By: Lowella Grip III M.D.   On: 07/12/2020 15:39    Procedures Procedures   Medications Ordered in ED Medications  fentaNYL (SUBLIMAZE)  injection 50 mcg (50 mcg Intravenous Given 07/12/20 1440)  HYDROmorphone (DILAUDID) injection 1 mg (1 mg  Intravenous Given 07/12/20 1557)  sodium chloride 0.9 % bolus 1,000 mL (1,000 mLs Intravenous New Bag/Given 07/12/20 1637)    ED Course  I have reviewed the triage vital signs and the nursing notes.  Pertinent labs & imaging results that were available during my care of the patient were reviewed by me and considered in my medical decision making (see chart for details).    MDM Rules/Calculators/A&P                         65 year old male with complaints of right-sided flank pain with concerns for kidney stone.  On arrival, vitals reassuring, no fever, no evidence of hypotension.  Lab work and imaging ordered, reviewed and interpreted by me.  CMP with a mildly elevated creatinine 1.61, appears to be at baseline at 1.5.  CBC without leukocytosis.  Mildly elevated lipase but not consistent with pancreatitis.  UA with moderate hemoglobin, more than 50 RBCs, rare bacteria.    CT of the abdomen with IMPRESSION:  1. Calculus at the right ureterovesical junction measuring 6 x 5 mm  with moderate hydronephrosis on the right. Right kidney is mildly  edematous.    2. Chronic thickening of the wall of the mid sigmoid colon with mild  surrounding mesenteric soft tissue thickening. Suspect muscular  hypertrophy from chronic diverticulosis. There may be chronic  inflammation in this portion of the sigmoid colon as well. This  finding may warrant direct visualization after appropriate colonic  cleansing. It should be noted that underlying neoplasm causing  chronic inflammation is a possibility with this appearance. No frank  diverticulitis is seen. Note that there are descending colonic  diverticula as well without colonic wall thickening or soft tissue  inflammation.    3. No bowel obstruction. No abscess in the abdomen or pelvis.  Appendix absent.    4. Aortic Atherosclerosis (ICD10-I70.0).  There is multilobular  fusiform dilatation in the mid aorta with a maximum diameter  inferior to the renal arteries measuring 3.4 x 3.4 cm, compared to  similar appearance with measured diameter of 3.3 x 3.3 cm on prior  study. Recommend follow-up ultrasound every 3 years. This  recommendation follows ACR consensus guidelines: White Paper of the  ACR Incidental Findings Committee II on Vascular Findings. J Am Coll  Radiol 2013; 10:789-794.    5. Prominent prostate with loss of clear fat plane between prostate  and inferior bladder. Advise clinical assessment and PSA evaluation.    6. Stable appearing adrenals bilaterally with a degree of adrenal  hypertrophy bilaterally. Clinical significance of this finding  uncertain.   Patient received fentanyl and Dilaudid as he has baseline renal disease and Toradol is contraindicated.  He reports significant improvement in pain.  I did speak with Dr. Lovena Neighbours with urology given CT scan findings, he recommends pain control, fluids and following up outpatient.  Patient was also informed about the chronic thickening of the mid sigmoid colon.  I will refer him to GI today and I did stress the importance of having a colonoscopy.  He was also informed of the dilation of the aorta, with recommendations for repeat ultrasound every 3 years.  Patient provided with Norco for pain.  We discussed return precautions.  He voiced understanding and is agreeable.  Stable for discharge.  This was a shared visit with my supervising physician Dr. 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

## 2020-07-12 NOTE — Discharge Instructions (Addendum)
Your workup today showed that you do have a right-sided kidney stone.  Please use the pain medicine as needed.  Do not take additional Tylenol with this pain medicine.  Please make sure to follow-up with urology.  Your CT scan also showed some thickening of your colon, and it is very recommended that you receive a colonoscopy.  Please follow-up with Dr. Perley Jain contact information I have provided in your discharge paperwork.  Your scan also showed some dilation of the aorta which is the vessel coming off of your heart.  This is not an emergent finding, but it is recommended that you receive an ultrasound every 3 years.  I would also recommend having your prostate evaluated by your urologist as well as a PSA level.  Please return to the ER for any new or worsening symptoms

## 2020-07-14 ENCOUNTER — Other Ambulatory Visit: Payer: Self-pay | Admitting: Nurse Practitioner

## 2020-07-14 ENCOUNTER — Other Ambulatory Visit: Payer: Self-pay

## 2020-07-14 ENCOUNTER — Telehealth: Payer: Self-pay

## 2020-07-14 DIAGNOSIS — N201 Calculus of ureter: Secondary | ICD-10-CM

## 2020-07-14 DIAGNOSIS — K639 Disease of intestine, unspecified: Secondary | ICD-10-CM

## 2020-07-14 NOTE — Telephone Encounter (Signed)
PT WENT TO ER AND WAS TOLD HE NEEDS TO BE REFERRAL ED TO  GI AND UROLOGY

## 2020-07-30 ENCOUNTER — Other Ambulatory Visit: Payer: Self-pay

## 2020-08-06 ENCOUNTER — Ambulatory Visit: Payer: Medicare Other

## 2020-08-18 ENCOUNTER — Other Ambulatory Visit: Payer: Self-pay

## 2020-08-18 ENCOUNTER — Encounter: Payer: Self-pay | Admitting: Gastroenterology

## 2020-08-18 MED FILL — Albuterol Sulfate Inhal Aero 108 MCG/ACT (90MCG Base Equiv): RESPIRATORY_TRACT | 25 days supply | Qty: 8.5 | Fill #0 | Status: AC

## 2020-08-18 MED FILL — Budesonide-Formoterol Fumarate Dihyd Aerosol 80-4.5 MCG/ACT: RESPIRATORY_TRACT | 30 days supply | Qty: 10.2 | Fill #0 | Status: AC

## 2020-08-19 ENCOUNTER — Other Ambulatory Visit: Payer: Self-pay

## 2020-08-19 ENCOUNTER — Ambulatory Visit (INDEPENDENT_AMBULATORY_CARE_PROVIDER_SITE_OTHER): Payer: Medicare Other | Admitting: Nurse Practitioner

## 2020-08-19 VITALS — BP 115/64 | HR 64 | Temp 97.6°F | Resp 18 | Ht 69.0 in | Wt 166.0 lb

## 2020-08-19 DIAGNOSIS — Z0000 Encounter for general adult medical examination without abnormal findings: Secondary | ICD-10-CM

## 2020-08-19 DIAGNOSIS — Z122 Encounter for screening for malignant neoplasm of respiratory organs: Secondary | ICD-10-CM

## 2020-08-19 NOTE — Progress Notes (Signed)
Subjective:   [name] [name] is a 65 y.o. male who presents for Medicare Annual/Subsequent preventive examination.  Review of Systems    Review of Systems  Constitutional: Negative.   HENT: Negative.    Eyes: Negative.   Respiratory: Negative.    Cardiovascular: Negative.   Gastrointestinal: Negative.   Genitourinary: Negative.   Musculoskeletal: Negative.   Skin: Negative.   Neurological: Negative.   Endo/Heme/Allergies: Negative.   Psychiatric/Behavioral: Negative.     Cardiac Risk Factors include: advanced age (>61men, >14 women);male gender     Objective:    Today's Vitals   08/19/20 1432  BP: 115/64  Pulse: 64  Resp: 18  Temp: 97.6 F (36.4 C)  SpO2: 99%  Weight: 166 lb (75.3 kg)  Height: 5\' 9"  (1.753 m)   Body mass index is 24.51 kg/m.  Advanced Directives 07/12/2020 12/12/2019 02/11/2017 02/03/2017 01/18/2017 01/10/2017 12/17/2016  Does Patient Have a Medical Advance Directive? No No No No No No No  Would patient like information on creating a medical advance directive? No - Patient declined - No - Patient declined No - Patient declined No - Patient declined - No - Patient declined    Current Medications (verified) Outpatient Encounter Medications as of 08/19/2020  Medication Sig   albuterol (VENTOLIN HFA) 108 (90 Base) MCG/ACT inhaler INHALE 2 PUFFS INTO THE LUNGS EVERY 4 (FOUR) HOURS AS NEEDED FOR WHEEZING OR SHORTNESS OF BREATH (COUGH, SHORTNESS OF BREATH OR WHEEZING.).   amLODipine (NORVASC) 10 MG tablet TAKE 1 TABLET (10 MG TOTAL) BY MOUTH DAILY.   atorvastatin (LIPITOR) 40 MG tablet TAKE 1 TABLET (40 MG TOTAL) BY MOUTH DAILY.   budesonide-formoterol (SYMBICORT) 80-4.5 MCG/ACT inhaler INHALE 2 PUFFS INTO THE LUNGS 2 (TWO) TIMES DAILY.   hydrALAZINE (APRESOLINE) 25 MG tablet TAKE 2 TABLETS (50 MG TOTAL) BY MOUTH EVERY 8 (EIGHT) HOURS.   HYDROcodone-acetaminophen (NORCO/VICODIN) 5-325 MG tablet Take 1 tablet by mouth every 4 (four) hours as needed.    hydrocortisone cream 1 % Apply 1 application topically 2 (two) times daily.   isosorbide mononitrate (IMDUR) 30 MG 24 hr tablet TAKE 1 TABLET (30 MG TOTAL) BY MOUTH DAILY.   levothyroxine (SYNTHROID) 125 MCG tablet Take 1 tablet (125 mcg total) by mouth daily before breakfast.   metoprolol tartrate (LOPRESSOR) 25 MG tablet TAKE 1 TABLET (25 MG TOTAL) BY MOUTH 2 (TWO) TIMES DAILY.   mirtazapine (REMERON SOL-TAB) 15 MG disintegrating tablet Take 1 tablet (15 mg total) by mouth at bedtime.   Vitamin D, Ergocalciferol, (DRISDOL) 1.25 MG (50000 UNIT) CAPS capsule TAKE 1 CAPSULE (50,000 UNITS TOTAL) BY MOUTH EVERY 7 (SEVEN) DAYS.   No facility-administered encounter medications on file as of 08/19/2020.    Allergies (verified) Codeine   History: Past Medical History:  Diagnosis Date   Arthritis    Chronic right ear pain 07/2019   Coronary artery disease    Depression 06/12/11   Buproprion per Dr. Lamonte Sakai   Drainage from ear, right 07/2019   Dyspnea    Full dentures    Fitting Per Dr. 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 of Cisplatin and Dhydrtion   Skin rash 01/2019   Smoking 06/12/11   1/2 pack/day   Status post chemotherapy 10/30/10 - 11/09/10  2 doses of Q 3 week Cisplatin   Status post radiation therapy 10/11/10 - 12/24/10   Bilateral Neck and Mucosa axis /  70 gray in 35 fractions   Xerostomia    Past Surgical History:  Procedure Laterality Date   APPENDECTOMY     BIOPSY TONGUE     TonsilandBaseoftongue   HERNIA REPAIR     umbilical with mesh   IR NEPHROSTOMY EXCHANGE LEFT  01/10/2017   IR NEPHROSTOMY PLACEMENT LEFT  11/15/2016   LEFT HEART CATH AND CORONARY ANGIOGRAPHY N/A 01/18/2017   Procedure: LEFT HEART CATH AND CORONARY ANGIOGRAPHY;  Surgeon: [name], MD;  Location: [facility];  Service: Cardiovascular;  Laterality: N/A;   NEPHROLITHOTOMY Left 02/11/2017   Procedure: NEPHROLITHOTOMY PERCUTANEOUS;  Surgeon: [name], MD;  Location: [facility];  Service: Urology;  Laterality: Left;   PEG TUBE REMOVAL  04/13/11   TONSILLECTOMY     right side   Family History  Problem Relation Age of Onset   Cancer Father 20       colon   Cancer Sister 38       leukemia   Social History   Socioeconomic History   Marital status: Single    Spouse name: Not on file   Number of children: Not on file   Years of education: Not on file   Highest education level: Not on file  Occupational History   Not on file  Tobacco Use   Smoking status: Every Day    Packs/day: 0.50    Years: 25.00    Pack years: 12.50    Types: Cigarettes   Smokeless tobacco: Never   Tobacco comments:    Smoking 4-5 cigarettes daily on average  Vaping Use   Vaping Use: Never used  Substance and Sexual Activity   Alcohol use: No    Alcohol/week: 0.0 standard drinks   Drug use: No   Sexual activity: Not Currently  Other Topics Concern   Not on file  Social History Narrative   Not on file   Social Determinants of Health   Financial Resource Strain: Low Risk    Difficulty of Paying Living Expenses: Not hard at all  Food Insecurity: No Food Insecurity   Worried About Charity fundraiser in the Last Year: Never true   Ran Out of Food in the Last Year: Never true  Transportation Needs: No Transportation Needs   Lack of Transportation (Medical): No   Lack of Transportation (Non-Medical): No  Physical Activity: Inactive   Days of Exercise per Week: 0 days   Minutes of Exercise per Session: 0 min  Stress: Stress Concern Present   Feeling of Stress : To some extent  Social Connections: Socially Isolated   Frequency of Communication with Friends and Family: More than three times a week   Frequency of Social Gatherings with Friends and Family: Twice a week   Attends Religious Services: Never    Marine scientist or Organizations: No   Attends Music therapist: Not on file   Marital Status: Widowed    Tobacco Counseling Ready to quit: Not Answered Counseling given: Not Answered Tobacco comments: Smoking 4-5 cigarettes daily on average   Clinical Intake:  Pre-visit preparation completed: No  Pain : No/denies pain     BMI - recorded: 24.5 Nutritional Status: BMI of 19-24  Normal Nutritional Risks: None Diabetes: No  How often do you need to have someone help you when you read  instructions, pamphlets, or other written materials from your doctor or pharmacy?: 1 - Never What is the last grade level you completed in school?: 11th  Diabetic? no   Interpreter Needed?: No      Activities of Daily Living In your present state of health, do you have any difficulty performing the following activities: 08/19/2020  Hearing? Y  Vision? N  Difficulty concentrating or making decisions? N  Walking or climbing stairs? Y  Dressing or bathing? N  Doing errands, shopping? N  Preparing Food and eating ? N  Using the Toilet? N  In the past six months, have you accidently leaked urine? N  Do you have problems with loss of bowel control? N  Managing your Medications? N  Managing your Finances? N  Housekeeping or managing your Housekeeping? N  Some recent data might be hidden    Patient Care Team: Vevelyn Francois, NP as PCP - General (Adult Health Nurse Practitioner) [name], MD as PCP - Cardiology (Cardiology) Melida Quitter, MD as Consulting Physician (Otolaryngology) Eppie Gibson, MD as Consulting Physician (Radiation Oncology)  Indicate any recent Medical Services you may have received from other than [facility] providers in the past year (date may be approximate).     Assessment:   This is a routine wellness examination for Roshawn.   Dietary issues and exercise activities discussed: Current Exercise Habits: The patient does not participate  in regular exercise at present, Exercise limited by: None identified   Goals Addressed   None    Depression Screen PHQ 2/9 Scores 08/19/2020 01/02/2020 09/08/2018 03/10/2018 05/25/2017 03/09/2017 01/03/2017  PHQ - 2 Score 2 3 3  0 0 0 0  PHQ- 9 Score 9 19 7  - - - -  Exception Documentation - Medical reason - - - - -    Fall Risk Fall Risk  08/19/2020 07/10/2020 01/02/2020 08/01/2019 01/31/2019  Falls in the past year? 0 0 0 0 0  Number falls in past yr: 0 0 0 0 0  Injury with Fall? 0 0 0 - 0  Risk for fall due to : No Fall Risks - - - -  Follow up Education provided - - - -    FALL RISK PREVENTION PERTAINING TO THE HOME:  Any stairs in or around the home? No  If so, are there any without handrails? Yes  Home free of loose throw rugs in walkways, pet beds, electrical cords, etc? Yes  Adequate lighting in your home to reduce risk of falls? Yes   ASSISTIVE DEVICES UTILIZED TO PREVENT FALLS:  Life alert? No  Use of a cane, walker or w/c? No  Grab bars in the bathroom? Yes  Shower chair or bench in shower? Yes  Elevated toilet seat or a handicapped toilet? No   TIMED UP AND GO:  Was the test performed? Yes .  Length of time to ambulate 10 feet: 3 sec.   Gait steady and fast without use of assistive device  Cognitive Function: MMSE - Mini Mental State Exam 08/19/2020  Orientation to time 5  Orientation to Place 5  Registration 3  Attention/ Calculation 5  Recall 3  Language- name 2 objects 2  Language- repeat 1  Language- follow 3 step command 3  Language- read & follow direction 1  Write a sentence 1  Copy design 1  Total score 30        Immunizations Immunization History  Administered Date(s) Administered   Influenza,inj,Quad PF,6+ Mos 11/16/2016, 01/02/2020  Pneumococcal Polysaccharide-23 06/24/2015    TDAP status: Up to date  Flu Vaccine status: Up to date  Pneumococcal vaccine status: Up to date  Covid-19 vaccine status: Completed vaccines  Qualifies for  Shingles Vaccine? No   Zostavax completed No   Shingrix Completed?: No.    Education has been provided regarding the importance of this vaccine. Patient has been advised to call insurance company to determine out of pocket expense if they have not yet received this vaccine. Advised may also receive vaccine at local pharmacy or Health Dept. Verbalized acceptance and understanding.  Screening Tests Health Maintenance  Topic Date Due   COVID-19 Vaccine (1) Never done   Zoster Vaccines- Shingrix (1 of 2) Never done   COLONOSCOPY (Pts 45-55yrs Insurance coverage will need to be confirmed)  Never done   PNA vac Low Risk Adult (1 of 2 - PCV13) 12/25/2019   TETANUS/TDAP  11/23/2026 (Originally 12/24/1974)   INFLUENZA VACCINE  09/29/2020   Hepatitis C Screening  Completed   HIV Screening  Completed   HPV VACCINES  Aged Out    Health Maintenance  Health Maintenance Due  Topic Date Due   COVID-19 Vaccine (1) Never done   Zoster Vaccines- Shingrix (1 of 2) Never done   COLONOSCOPY (Pts 45-36yrs Insurance coverage will need to be confirmed)  Never done   PNA vac Low Risk Adult (1 of 2 - PCV13) 12/25/2019    Colorectal cancer screening: Type of screening: Colonoscopy. Appointment scheduled for July 22  Lung Cancer Screening: (Low Dose CT Chest recommended if Age 43-80 years, 30 pack-year currently smoking OR have quit w/in 15years.) does qualify.   Lung Cancer Screening Referral: referral placed  Additional Screening:  Hepatitis C Screening: does qualify; Completed    Vision Screening: Recommended annual ophthalmology exams for early detection of glaucoma and other disorders of the eye. Is the patient up to date with their annual eye exam?  Yes  Who is the provider or what is the name of the office in which the patient attends annual eye exams? In [place] If pt is not established with a provider, would they like to be referred to a provider to establish care?  na .   Dental  Screening: Recommended annual dental exams for proper oral hygiene  Community Resource Referral / Chronic Care Management: CRR required this visit?  No   CCM required this visit?  No      Plan:     I have personally reviewed and noted the following in the patient's chart:   Medical and social history Use of alcohol, tobacco or illicit drugs  Current medications and supplements including opioid prescriptions. Patient is currently taking opioid prescriptions. Information provided to patient regarding non-opioid alternatives. Patient advised to discuss non-opioid treatment plan with their provider. Functional ability and status Nutritional status Physical activity Advanced directives List of other physicians Hospitalizations, surgeries, and ER visits in previous 12 months Vitals Screenings to include cognitive, depression, and falls Referrals and appointments  In addition, I have reviewed and discussed with patient certain preventive protocols, quality metrics, and best practice recommendations. A written personalized care plan for preventive services as well as general preventive health recommendations were provided to patient.     Fenton Foy, NP   08/19/2020

## 2020-08-19 NOTE — Patient Instructions (Addendum)
[name] [name] , Thank you for taking time to come for your Medicare Wellness Visit. I appreciate your ongoing commitment to your health goals. Please review the following plan we discussed and let me know if I can assist you in the future.   These are the goals we discussed:  Goals   None     This is a list of the screening recommended for you and due dates:  Health Maintenance  Topic Date Due   COVID-19 Vaccine (1) Never done   Zoster (Shingles) Vaccine (1 of 2) Never done   Colon Cancer Screening  Never done   Pneumonia vaccines (1 of 2 - PCV13) 12/25/2019   Tetanus Vaccine  11/23/2026*   Flu Shot  09/29/2020   Hepatitis C Screening: USPSTF Recommendation to screen - Ages 18-79 yo.  Completed   HIV Screening  Completed   HPV Vaccine  Aged Out  *Topic was postponed. The date shown is not the original due date.    Managing Pain Without Opioids Opioids are strong medicines used to treat moderate to severe pain. For some people, especially those who have long-term (chronic) pain, opioids may not be the best choice for pain management due to: Side effects like nausea, constipation, and sleepiness. The risk of addiction (opioid use disorder). The longer you take opioids, the greater your risk of addiction. Pain that lasts for more than 3 months is called chronic pain. Managing chronic pain usually requires more than one approach and is often provided by a team of health care providers working together (multidisciplinary approach). Pain management may be done at a pain management center or pain clinic. Types of pain management without opioids Managing pain without opioids can involve: Non-opioid medicines. Exercises to help relieve pain and improve strength and range of motion (physical therapy). Therapy to help with everyday tasks and activities (occupational therapy). Therapy to help you find ways to relieve pain by doing things you enjoy (recreational therapy). Talk therapy  (psychotherapy) and other mental health therapies. Medical treatments such as injections or devices. Making lifestyle changes. Pain management options Non-opioid medicines Non-opioid medicines for pain may include medicines taken by mouth (oral medicines), such as: Over-the-counter or prescription NSAIDs. These may be the first medicines used for pain. They work well for muscle and bone pain, and they reduce swelling. Acetaminophen. This over-the-counter medicine may work well for milder pain but not swelling. Antidepressants. These may be used to treat chronic pain. A certain type of antidepressant (tricyclics) is often used. These medicines are given in lower doses for pain than when used for depression. Anticonvulsants. These are usually used to treat seizures but may also reduce nerve (neuropathic) pain. Muscle relaxants. These relieve pain caused by sudden muscle tightening (spasms). You may also use a type of pain medicine that is applied to the skin as a patch, cream, or gel (topical analgesic), such as a numbing medicine. These may cause fewer side effects than oralmedicines. Therapy Physical therapy involves doing exercises to gain strength and flexibility. A physical therapist may teach you exercises to move and stretch parts of your body that are weak, stiff, or painful. You can learn these exercises at physical therapy visits and practice them at home. Physical therapy may also involve: Massage. Heat wraps or applying heat or cold to affected areas. Sending electrical signals through the skin to interrupt pain signals (transcutaneous electrical nerve stimulation, TENS). Sending weak lasers through the skin to reduce pain and swelling (low-level laser therapy). Using signals from  your body to help you learn to regulate pain (biofeedback). Occupational therapy helps you learn ways to function at home and work withless pain. Recreational therapy may involve trying new activities or  hobbies, such asdrawing or a physical activity. Types of mental health therapy for pain include: Cognitive behavioral therapy (CBT) to help you learn coping skills for dealing with pain. Acceptance and commitment therapy (ACT) to change the way you think and react to pain. Relaxation therapies, including muscle relaxation exercises and focusing your mind on the present moment to lower stress (mindfulness-based stress reduction). Pain management counseling. This may be individual, family, or group counseling.  Medical treatments Medical treatments for pain management include: Nerve block injections. These may include a pain blocker and anti-inflammatory medicines. You may have injections: Near the spine to relieve chronic back or neck pain. Into joints to relieve back or joint pain. Into nerve areas that supply a painful area to relieve body pain. Into muscles (trigger point injections) to relieve some painful muscle conditions. A medical device placed near your spine to help block pain signals and relieve nerve pain or chronic back pain (spinal cord stimulation device). Acupuncture. Follow these instructions at home Medicines Take over-the-counter and prescription medicines only as told by your health care provider. If you are taking pain medicine, ask your health care providers about possible side effects to watch out for. Do not drive or use heavy machinery while taking prescription pain medicine. Lifestyle  Do not use drugs or alcohol to reduce pain. Limit alcohol intake to no more than 1 drink a day for nonpregnant women and 2 drinks a day for men. One drink equals 12 oz of beer, 5 oz of wine, or 1 oz of hard liquor. Do not use any products that contain nicotine or tobacco, such as cigarettes and e-cigarettes. These can delay healing. If you need help quitting, ask your health care provider. Eat a healthy diet and maintain a healthy weight. Poor diet and excess weight may make pain  worse. Eat foods that are high in fiber. These include fresh fruits and vegetables, whole grains, and beans. Limit foods that are high in fat and processed sugars, such as fried and sweet foods. Exercise regularly. Exercise lowers stress and may help relieve pain. Ask your health care provider what activities and exercises are safe for you. If your health care provider approves, join an exercise class that combines movement and stress reduction. Examples include yoga and tai chi. Get enough sleep. Lack of sleep may make pain worse. Lower stress as much as possible. Practice stress reduction techniques as told by your therapist.  General instructions Work with all your pain management providers to find the treatments that work best for you. You are an important member of your pain management team. There are many things you can do to reduce pain on your own. Consider joining an online or in-person support group for people who have chronic pain. Keep all follow-up visits as told by your health care providers. This is important. Where to find more information You can find more information about managing pain without opioids from: American Academy of Pain Medicine: painmed.[place] for Chronic Pain: instituteforchronicpain.org American Chronic Pain Association: theacpa.org Contact a health care provider if: You have side effects from pain medicine. Your pain gets worse or does not get better with treatments or home care. You are struggling with anxiety or depression. Summary Many types of pain can be managed without opioids. Chronic pain may respond better  to pain management without opioids. Pain is best managed with a team of providers working together. Pain management without opioids may include non-opioid medicines, medical treatments, physical therapy, mental health therapy, and lifestyle changes. Tell your health care providers if your pain gets worse or is not being managed well  enough. This information is not intended to replace advice given to you by your health care provider. Make sure you discuss any questions you have with your healthcare provider. Document Revised: 11/27/2019 Document Reviewed: 11/29/2019 Elsevier Patient Education  2022 [place].   Colonoscopy, Adult A colonoscopy is a procedure to look at the entire large intestine. This procedure is done using a long, thin, flexible tube that has a camera on theend. You may have a colonoscopy: As a part of normal colorectal screening. If you have certain symptoms, such as: A low number of red blood cells in your blood (anemia). Diarrhea that does not go away. Pain in your abdomen. Blood in your stool. A colonoscopy can help screen for and diagnose medical problems, including: Tumors. Extra tissue that grows where mucus forms (polyps). Inflammation. Areas of bleeding. Tell your health care provider about: Any allergies you have. All medicines you are taking, including vitamins, herbs, eye drops, creams, and over-the-counter medicines. Any problems you or family members have had with anesthetic medicines. Any blood disorders you have. Any surgeries you have had. Any medical conditions you have. Any problems you have had with having bowel movements. Whether you are pregnant or may be pregnant. What are the risks? Generally, this is a safe procedure. However, problems may occur, including: Bleeding. Damage to your intestine. Allergic reactions to medicines given during the procedure. Infection. This is rare. What happens before the procedure? Eating and drinking restrictions Follow instructions from your health care provider about eating or drinking restrictions, which may include: A few days before the procedure: Follow a low-fiber diet. Avoid nuts, seeds, dried fruit, raw fruits, and vegetables. 1-3 days before the procedure: Eat only gelatin dessert or ice pops. Drink only clear  liquids, such as water, clear juice, clear broth or bouillon, black coffee or tea, or clear soft drinks or sports drinks. Avoid liquids that contain red or purple dye. The day of the procedure: Do not eat solid foods. You may continue to drink clear liquids until up to 2 hours before the procedure. Do not eat or drink anything starting 2 hours before the procedure, or within the time period that your health care provider recommends. Bowel prep If you were prescribed a bowel prep to take by mouth (orally) to clean out your colon: Take it as told by your health care provider. Starting the day before your procedure, you will need to drink a large amount of liquid medicine. The liquid will cause you to have many bowel movements of loose stool until your stool becomes almost clear or light green. If your skin or the opening between the buttocks (anus) gets irritated from diarrhea, you may relieve the irritation using: Wipes with medicine in them, such as adult wet wipes with aloe and vitamin E. A product to soothe skin, such as petroleum jelly. If you vomit while drinking the bowel prep: Take a break for up to 60 minutes. Begin the bowel prep again. Call your health care provider if you keep vomiting or you cannot take the bowel prep without vomiting. To clean out your colon, you may also be given: Laxative medicines. These help you have a bowel movement. Instructions for enema  use. An enema is liquid medicine injected into your rectum. Medicines Ask your health care provider about: Changing or stopping your regular medicines or supplements. This is especially important if you are taking iron supplements, diabetes medicines, or blood thinners. Taking medicines such as aspirin and ibuprofen. These medicines can thin your blood. Do not take these medicines unless your health care provider tells you to take them. Taking over-the-counter medicines, vitamins, herbs, and supplements. General  instructions Ask your health care provider what steps will be taken to help prevent infection. These may include washing skin with a germ-killing soap. Plan to have someone take you home from the hospital or clinic. What happens during the procedure?  An IV will be inserted into one of your veins. You may be given one or more of the following: A medicine to help you relax (sedative). A medicine to numb the area (local anesthetic). A medicine to make you fall asleep (general anesthetic). This is rarely needed. You will lie on your side with your knees bent. The tube will: Have oil or gel put on it (be lubricated). Be inserted into your anus. Be gently eased through all parts of your large intestine. Air will be sent into your colon to keep it open. This may cause some pressure or cramping. Images will be taken with the camera and will appear on a screen. A small tissue sample may be removed to be looked at under a microscope (biopsy). The tissue may be sent to a lab for testing if any signs of problems are found. If small polyps are found, they may be removed and checked for cancer cells. When the procedure is finished, the tube will be removed. The procedure may vary among health care providers and hospitals. What happens after the procedure? Your blood pressure, heart rate, breathing rate, and blood oxygen level will be monitored until you leave the hospital or clinic. You may have a small amount of blood in your stool. You may pass gas and have mild cramping or bloating in your abdomen. This is caused by the air that was used to open your colon during the exam. Do not drive for 24 hours after the procedure. It is up to you to get the results of your procedure. Ask your health care provider, or the department that is doing the procedure, when your results will be ready. Summary A colonoscopy is a procedure to look at the entire large intestine. Follow instructions from your health care  provider about eating and drinking before the procedure. If you were prescribed an oral bowel prep to clean out your colon, take it as told by your health care provider. During the colonoscopy, a flexible tube with a camera on its end is inserted into the anus and then passed into the other parts of the large intestine. This information is not intended to replace advice given to you by your health care provider. Make sure you discuss any questions you have with your healthcare provider. Document Revised: 09/08/2018 Document Reviewed: 09/08/2018 Elsevier Patient Education  [place] Prevention in the Home, Adult Falls can cause injuries and can happen to people of all ages. There are many things you can do to make your home safe and to help prevent falls. Ask forhelp when making these changes. What actions can I take to prevent falls? General Instructions Use good lighting in all rooms. Replace any light bulbs that burn out. Turn on the lights in dark areas. Use night-lights.  Keep items that you use often in easy-to-reach places. Lower the shelves around your home if needed. Set up your furniture so you have a clear path. Avoid moving your furniture around. Do not have throw rugs or other things on the floor that can make you trip. Avoid walking on wet floors. If any of your floors are uneven, fix them. Add color or contrast paint or tape to clearly mark and help you see: Grab bars or handrails. First and last steps of staircases. Where the edge of each step is. If you use a stepladder: Make sure that it is fully opened. Do not climb a closed stepladder. Make sure the sides of the stepladder are locked in place. Ask someone to hold the stepladder while you use it. Know where your pets are when moving through your home. What can I do in the bathroom?     Keep the floor dry. Clean up any water on the floor right away. Remove soap buildup in the tub or shower. Use nonskid  mats or decals on the floor of the tub or shower. Attach bath mats securely with double-sided, nonslip rug tape. If you need to sit down in the shower, use a plastic, nonslip stool. Install grab bars by the toilet and in the tub and shower. Do not use towel bars as grab bars. What can I do in the bedroom? Make sure that you have a light by your bed that is easy to reach. Do not use any sheets or blankets for your bed that hang to the floor. Have a firm chair with side arms that you can use for support when you get dressed. What can I do in the kitchen? Clean up any spills right away. If you need to reach something above you, use a step stool with a grab bar. Keep electrical cords out of the way. Do not use floor polish or wax that makes floors slippery. What can I do with my stairs? Do not leave any items on the stairs. Make sure that you have a light switch at the top and the bottom of the stairs. Make sure that there are handrails on both sides of the stairs. Fix handrails that are broken or loose. Install nonslip stair treads on all your stairs. Avoid having throw rugs at the top or bottom of the stairs. Choose a carpet that does not hide the edge of the steps on the stairs. Check carpeting to make sure that it is firmly attached to the stairs. Fix carpet that is loose or worn. What can I do on the outside of my home? Use bright outdoor lighting. Fix the edges of walkways and driveways and fix any cracks. Remove anything that might make you trip as you walk through a door, such as a raised step or threshold. Trim any bushes or trees on paths to your home. Check to see if handrails are loose or broken and that both sides of all steps have handrails. Install guardrails along the edges of any raised decks and porches. Clear paths of anything that can make you trip, such as tools or rocks. Have leaves, snow, or ice cleared regularly. Use sand or salt on paths during winter. Clean up any  spills in your garage right away. This includes grease or oil spills. What other actions can I take? Wear shoes that: Have a low heel. Do not wear high heels. Have rubber bottoms. Feel good on your feet and fit well. Are closed at the  toe. Do not wear open-toe sandals. Use tools that help you move around if needed. These include: Canes. Walkers. Scooters. Crutches. Review your medicines with your doctor. Some medicines can make you feel dizzy. This can increase your chance of falling. Ask your doctor what else you can do to help prevent falls. Where to find more information Centers for Disease Control and Prevention, [place]: http://www.wolf.info/ National Institute on Aging: http://kim-miller.com/ Contact a doctor if: You are afraid of falling at home. You feel weak, drowsy, or dizzy at home. You fall at home. Summary There are many simple things that you can do to make your home safe and to help prevent falls. Ways to make your home safe include removing things that can make you trip and installing grab bars in the bathroom. Ask for help when making these changes in your home. This information is not intended to replace advice given to you by your health care provider. Make sure you discuss any questions you have with your healthcare provider. Document Revised: 09/19/2019 Document Reviewed: 09/19/2019 Elsevier Patient Education  [place] Maintenance, Male Adopting a healthy lifestyle and getting preventive care are important in promoting health and wellness. Ask your health care provider about: The right schedule for you to have regular tests and exams. Things you can do on your own to prevent diseases and keep yourself healthy. What should I know about diet, weight, and exercise? Eat a healthy diet  Eat a diet that includes plenty of vegetables, fruits, low-fat dairy products, and lean protein. Do not eat a lot of foods that are high in solid fats, added sugars, or  sodium.  Maintain a healthy weight Body mass index (BMI) is a measurement that can be used to identify possible weight problems. It estimates body fat based on height and weight. Your health care provider can help determine your BMI and help you achieve or maintain ahealthy weight. Get regular exercise Get regular exercise. This is one of the most important things you can do for your health. Most adults should: Exercise for at least 150 minutes each week. The exercise should increase your heart rate and make you sweat (moderate-intensity exercise). Do strengthening exercises at least twice a week. This is in addition to the moderate-intensity exercise. Spend less time sitting. Even light physical activity can be beneficial. Watch cholesterol and blood lipids Have your blood tested for lipids and cholesterol at 66 years of age, then havethis test every 5 years. You may need to have your cholesterol levels checked more often if: Your lipid or cholesterol levels are high. You are older than 66 years of age. You are at high risk for heart disease. What should I know about cancer screening? Many types of cancers can be detected early and may often be prevented. Depending on your health history and family history, you may need to have cancer screening at various ages. This may include screening for: Colorectal cancer. Prostate cancer. Skin cancer. Lung cancer. What should I know about heart disease, diabetes, and high blood pressure? Blood pressure and heart disease High blood pressure causes heart disease and increases the risk of stroke. This is more likely to develop in people who have high blood pressure readings, are of African descent, or are overweight. Talk with your health care provider about your target blood pressure readings. Have your blood pressure checked: Every 3-5 years if you are 68-80 years of age. Every year if you are 48 years old or older. If you  are between the ages of 84  and 11 and are a current or former smoker, ask your health care provider if you should have a one-time screening for abdominal aortic aneurysm (AAA). Diabetes Have regular diabetes screenings. This checks your fasting blood sugar level. Have the screening done: Once every three years after age 49 if you are at a normal weight and have a low risk for diabetes. More often and at a younger age if you are overweight or have a high risk for diabetes. What should I know about preventing infection? Hepatitis B If you have a higher risk for hepatitis B, you should be screened for this virus. Talk with your health care provider to find out if you are at risk forhepatitis B infection. Hepatitis C Blood testing is recommended for: Everyone born from [birth year] through 1966. Anyone with known risk factors for hepatitis C. Sexually transmitted infections (STIs) You should be screened each year for STIs, including gonorrhea and chlamydia, if: You are sexually active and are younger than 66 years of age. You are older than 66 years of age and your health care provider tells you that you are at risk for this type of infection. Your sexual activity has changed since you were last screened, and you are at increased risk for chlamydia or gonorrhea. Ask your health care provider if you are at risk. Ask your health care provider about whether you are at high risk for HIV. Your health care provider may recommend a prescription medicine to help prevent HIV infection. If you choose to take medicine to prevent HIV, you should first get tested for HIV. You should then be tested every 3 months for as long as you are taking the medicine. Follow these instructions at home: Lifestyle Do not use any products that contain nicotine or tobacco, such as cigarettes, e-cigarettes, and chewing tobacco. If you need help quitting, ask your health care provider. Do not use street drugs. Do not share needles. Ask your health care provider  for help if you need support or information about quitting drugs. Alcohol use Do not drink alcohol if your health care provider tells you not to drink. If you drink alcohol: Limit how much you have to 0-2 drinks a day. Be aware of how much alcohol is in your drink. In the [place] one drink equals one 12 oz bottle of beer (355 mL), one 5 oz glass of wine (148 mL), or one 1 oz glass of hard liquor (44 mL). General instructions Schedule regular health, dental, and eye exams. Stay current with your vaccines. Tell your health care provider if: You often feel depressed. You have ever been abused or do not feel safe at home. Summary Adopting a healthy lifestyle and getting preventive care are important in promoting health and wellness. Follow your health care provider's instructions about healthy diet, exercising, and getting tested or screened for diseases. Follow your health care provider's instructions on monitoring your cholesterol and blood pressure. This information is not intended to replace advice given to you by your health care provider. Make sure you discuss any questions you have with your healthcare provider. Document Revised: 02/08/2018 Document Reviewed: 02/08/2018 Elsevier Patient Education  2022 [employer].  Steps to Quit Smoking Smoking tobacco is the leading cause of preventable death. It can affect almost every organ in the body. Smoking puts you and people around you at risk for many serious, long-lasting (chronic) diseases. Quitting smoking can be hard, but it is one of the best  things thatyou can do for your health. It is never too late to quit. How do I get ready to quit? When you decide to quit smoking, make a plan to help you succeed. Before you quit: Pick a date to quit. Set a date within the next 2 weeks to give you time to prepare. Write down the reasons why you are quitting. Keep this list in places where you will see it often. Tell your family, friends, and  co-workers that you are quitting. Their support is important. Talk with your doctor about the choices that may help you quit. Find out if your health insurance will pay for these treatments. Know the people, places, things, and activities that make you want to smoke (triggers). Avoid them. What first steps can I take to quit smoking? Throw away all cigarettes at home, at work, and in your car. Throw away the things that you use when you smoke, such as ashtrays and lighters. Clean your car. Make sure to empty the ashtray. Clean your home, including curtains and carpets. What can I do to help me quit smoking? Talk with your doctor about taking medicines and seeing a counselor at the same time. You are more likely to succeed when you do both. If you are pregnant or breastfeeding, talk with your doctor about counseling or other ways to quit smoking. Do not take medicine to help you quit smoking unless your doctor tells you to do so. To quit smoking: Quit right away Quit smoking totally, instead of slowly cutting back on how much you smoke over a period of time. Go to counseling. You are more likely to quit if you go to counseling sessions regularly. Take medicine You may take medicines to help you quit. Some medicines need a prescription, and some you can buy over-the-counter. Some medicines may contain a drug called nicotine to replace the nicotine in cigarettes. Medicines may: Help you to stop having the desire to smoke (cravings). Help to stop the problems that come when you stop smoking (withdrawal symptoms). Your doctor may ask you to use: Nicotine patches, gum, or lozenges. Nicotine inhalers or sprays. Non-nicotine medicine that is taken by mouth. Find resources Find resources and other ways to help you quit smoking and remain smoke-free after you quit. These resources are most helpful when you use them often. They include: Online chats with a Social worker. Phone quitlines. Printed  Furniture conservator/restorer. Support groups or group counseling. Text messaging programs. Mobile phone apps. Use apps on your mobile phone or tablet that can help you stick to your quit plan. There are many free apps for mobile phones and tablets as well as websites. Examples include Quit Guide from the [employer] and smokefree.gov  What things can I do to make it easier to quit?  Talk to your family and friends. Ask them to support and encourage you. Call a phone quitline (1-800-QUIT-NOW), reach out to support groups, or work with a Social worker. Ask people who smoke to not smoke around you. Avoid places that make you want to smoke, such as: Bars. Parties. Smoke-break areas at work. Spend time with people who do not smoke. Lower the stress in your life. Stress can make you want to smoke. Try these things to help your stress: Getting regular exercise. Doing deep-breathing exercises. Doing yoga. Meditating. Doing a body scan. To do this, close your eyes, focus on one area of your body at a time from head to toe. Notice which parts of your body are tense.  Try to relax the muscles in those areas. How will I feel when I quit smoking? Day 1 to 3 weeks Within the first 24 hours, you may start to have some problems that come from quitting tobacco. These problems are very bad 2-3 days after you quit, but they do not often last for more than 2-3 weeks. You may get these symptoms: Mood swings. Feeling restless, nervous, angry, or annoyed. Trouble concentrating. Dizziness. Strong desire for high-sugar foods and nicotine. Weight gain. Trouble pooping (constipation). Feeling like you may vomit (nausea). Coughing or a sore throat. Changes in how the medicines that you take for other issues work in your body. Depression. Trouble sleeping (insomnia). Week 3 and afterward After the first 2-3 weeks of quitting, you may start to notice more positive results, such as: Better sense of smell and taste. Less coughing  and sore throat. Slower heart rate. Lower blood pressure. Clearer skin. Better breathing. Fewer sick days. Quitting smoking can be hard. Do not give up if you fail the first time. Some people need to try a few times before they succeed. Do your best to stick to your quit plan, and talk with yourdoctor if you have any questions or concerns. Summary Smoking tobacco is the leading cause of preventable death. Quitting smoking can be hard, but it is one of the best things that you can do for your health. When you decide to quit smoking, make a plan to help you succeed. Quit smoking right away, not slowly over a period of time. When you start quitting, seek help from your doctor, family, or friends. This information is not intended to replace advice given to you by your health care provider. Make sure you discuss any questions you have with your healthcare provider. Document Revised: 11/10/2018 Document Reviewed: 05/06/2018 Elsevier Patient Education  [place].

## 2020-08-21 ENCOUNTER — Encounter: Payer: Self-pay | Admitting: Nurse Practitioner

## 2020-08-21 ENCOUNTER — Other Ambulatory Visit: Payer: Self-pay

## 2020-08-21 ENCOUNTER — Ambulatory Visit (INDEPENDENT_AMBULATORY_CARE_PROVIDER_SITE_OTHER): Payer: Medicare Other | Admitting: Nurse Practitioner

## 2020-08-21 VITALS — BP 106/67 | HR 73 | Temp 98.1°F | Ht 69.0 in | Wt 164.1 lb

## 2020-08-21 DIAGNOSIS — I10 Essential (primary) hypertension: Secondary | ICD-10-CM

## 2020-08-21 DIAGNOSIS — E039 Hypothyroidism, unspecified: Secondary | ICD-10-CM | POA: Diagnosis not present

## 2020-08-21 DIAGNOSIS — R3911 Hesitancy of micturition: Secondary | ICD-10-CM

## 2020-08-21 DIAGNOSIS — F339 Major depressive disorder, recurrent, unspecified: Secondary | ICD-10-CM | POA: Diagnosis not present

## 2020-08-21 LAB — POCT URINALYSIS DIPSTICK
Bilirubin, UA: NEGATIVE
Blood, UA: NEGATIVE
Glucose, UA: NEGATIVE
Ketones, UA: NEGATIVE
Leukocytes, UA: NEGATIVE
Nitrite, UA: NEGATIVE
Protein, UA: POSITIVE — AB
Spec Grav, UA: 1.025 (ref 1.010–1.025)
Urobilinogen, UA: 0.2 E.U./dL
pH, UA: 5.5 (ref 5.0–8.0)

## 2020-08-21 MED ORDER — TAMSULOSIN HCL 0.4 MG PO CAPS
0.4000 mg | ORAL_CAPSULE | Freq: Every day | ORAL | 3 refills | Status: AC
  Filled 2020-08-21 – 2021-05-11 (×2): qty 90, 90d supply, fill #0

## 2020-08-21 NOTE — Progress Notes (Signed)
[facility], [place]  [address] Phone:  [phone]   Fax:  [phone]   Established Patient Office Visit  Subjective:  Patient ID: [name] [name], male    DOB: [birth year]  Age: 65 y.o. MRN: [id number]  CC:  Chief Complaint  Patient presents with   Follow-up    6 week follow up; started Remeron feel like it started giving him problems urinating     HP [name] [name] presents for follow up. 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    Hypothyroidism [name] [name] is a 65 y.o. male who presents for follow up of hypothyroidism. Current symptoms: change in energy level . Patient denies diarrhea, heat / cold intolerance, nervousness, palpitations, and weight changes. Symptoms have stabilized.   He is following up for his depression. He is taking Remeron 15 mg daily. He has not noted much of a change.  He is concerned that it may have caused him some urinary retention.  He reports being up to 3 times each night and having hesitancy in the mornings.  His PSA has not been evaluated last year. 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 of Cisplatin and Dhydrtion   Skin rash 01/2019   Smoking 06/12/11   1/2 pack/day   Status post chemotherapy 10/30/10 - 11/09/10    2 doses of Q 3 week Cisplatin   Status post radiation therapy 10/11/10 - 12/24/10   Bilateral Neck and Mucosa axis /  70 gray in 35 fractions   Xerostomia     Past Surgical History:  Procedure Laterality Date   APPENDECTOMY     BIOPSY TONGUE     TonsilandBaseoftongue   HERNIA REPAIR     umbilical with mesh   IR NEPHROSTOMY EXCHANGE LEFT  01/10/2017   IR NEPHROSTOMY PLACEMENT LEFT  11/15/2016   LEFT HEART CATH AND CORONARY ANGIOGRAPHY N/A 01/18/2017   Procedure: LEFT HEART CATH AND CORONARY ANGIOGRAPHY;  Surgeon: [name] Sine, [name] [name];  Location: [facility];  Service: Cardiovascular;  Laterality: N/A;   NEPHROLITHOTOMY Left 02/11/2017   Procedure: NEPHROLITHOTOMY PERCUTANEOUS;  Surgeon: [name] Rhodes, [name] [name];  Location: [facility];  Service: Urology;  Laterality: Left;   PEG TUBE REMOVAL  04/13/11   TONSILLECTOMY     right side    Family History  Problem Relation Age of Onset   Cancer Father 77       colon   Cancer Sister 30       leukemia    Social History   Socioeconomic History   Marital  status: Single    Spouse name: Not on file   Number of children: Not on file   Years of education: Not on file   Highest education level: Not on file  Occupational History   Not on file  Tobacco Use   Smoking status: Every Day    Packs/day: 0.50    Years: 25.00    Pack years: 12.50    Types: Cigarettes   Smokeless tobacco: Never   Tobacco comments:    Smoking 4-5 cigarettes daily on average  Vaping Use   Vaping Use: Never used  Substance and Sexual Activity   Alcohol use: No    Alcohol/week: 0.0 standard drinks   Drug use: No   Sexual activity: Not Currently  Other Topics Concern   Not on file  Social History Narrative   Not on file   Social Determinants of  Health   Financial Resource Strain: Low Risk    Difficulty of Paying Living Expenses: Not hard at all  Food Insecurity: No Food Insecurity   Worried About Charity fundraiser in the Last Year: Never true   Ran Out of Food in the Last Year: Never true  Transportation Needs: No Transportation Needs   Lack of Transportation (Medical): No   Lack of Transportation (Non-Medical): No  Physical Activity: Inactive   Days of Exercise [name] Week: 0 days   Minutes of Exercise [name] Session: 0 min  Stress: Stress Concern Present   Feeling of Stress : To some extent  Social Connections: Socially Isolated   Frequency of Communication with Friends and Family: More than three times a week   Frequency of Social Gatherings with Friends and Family: Twice a week   Attends Religious Services: Never   Marine scientist or Organizations: No   Attends Music therapist: Not on file   Marital Status: Widowed  Human resources officer Violence: Not At Risk   Fear of Current or Ex-Partner: No   Emotionally Abused: No   Physically Abused: No   Sexually Abused: No    Outpatient Medications Prior to Visit  Medication Sig Dispense Refill   albuterol (VENTOLIN HFA) 108 (90 Base) MCG/ACT inhaler INHALE 2 PUFFS INTO THE LUNGS EVERY 4 (FOUR) HOURS AS NEEDED FOR WHEEZING OR SHORTNESS OF BREATH (COUGH, SHORTNESS OF BREATH OR WHEEZING.). 18 g 11   amLODipine (NORVASC) 10 MG tablet TAKE 1 TABLET (10 MG TOTAL) BY MOUTH DAILY. 90 tablet 3   atorvastatin (LIPITOR) 40 MG tablet TAKE 1 TABLET (40 MG TOTAL) BY MOUTH DAILY. 90 tablet 3   budesonide-formoterol (SYMBICORT) 80-4.5 MCG/ACT inhaler INHALE 2 PUFFS INTO THE LUNGS 2 (TWO) TIMES DAILY. 10.2 g 11   hydrALAZINE (APRESOLINE) 25 MG tablet TAKE 2 TABLETS (50 MG TOTAL) BY MOUTH EVERY 8 (EIGHT) HOURS. 180 tablet 3   HYDROcodone-acetaminophen (NORCO/VICODIN) 5-325 MG tablet Take 1 tablet by mouth every 4 (four) hours as needed. 6 tablet 0   hydrocortisone cream 1 % Apply  1 application topically 2 (two) times daily. 30 g 6   isosorbide mononitrate (IMDUR) 30 MG 24 hr tablet TAKE 1 TABLET (30 MG TOTAL) BY MOUTH DAILY. 90 tablet 3   levothyroxine (SYNTHROID) 125 MCG tablet Take 1 tablet (125 mcg total) by mouth daily before breakfast. 90 tablet 3   metoprolol tartrate (LOPRESSOR) 25 MG tablet TAKE 1 TABLET (25 MG TOTAL) BY MOUTH 2 (TWO) TIMES DAILY. 180 tablet 3   mirtazapine (REMERON SOL-TAB) 15 MG disintegrating tablet Take 1 tablet (15  mg total) by mouth at bedtime. 90 tablet 3   Vitamin D, Ergocalciferol, (DRISDOL) 1.25 MG (50000 UNIT) CAPS capsule TAKE 1 CAPSULE (50,000 UNITS TOTAL) BY MOUTH EVERY 7 (SEVEN) DAYS. 5 capsule 6   No facility-administered medications prior to visit.    Allergies  Allergen Reactions   Codeine Nausea Only    ROS Review of Systems  Respiratory:  Positive for shortness of breath.   Gastrointestinal:  Positive for constipation.     Objective:    Physical Exam Constitutional:      General: He is not in acute distress.    Appearance: He is not ill-appearing, toxic-appearing or diaphoretic.  HENT:     Head: Normocephalic and atraumatic.  Cardiovascular:     Rate and Rhythm: Normal rate and regular rhythm.     Pulses: Normal pulses.     Heart sounds: Normal heart sounds.  Pulmonary:     Effort: Pulmonary effort is normal.     Comments: Diminished expiratory breath sounds bases Abdominal:     Palpations: Abdomen is soft.  Musculoskeletal:        General: Normal range of motion.     Cervical back: Normal range of motion.  Skin:    General: Skin is warm and dry.     Capillary Refill: Capillary refill takes less than 2 seconds.  Neurological:     General: No focal deficit present.     Mental Status: He is alert and oriented to person, place, and time.  Psychiatric:        Mood and Affect: Mood normal.        Behavior: Behavior normal.        Thought Content: Thought content normal.        Judgment: Judgment  normal.    BP 106/67 (BP Location: Right Arm, Patient Position: Sitting)   Pulse 73   Temp 98.1 F (36.7 C)   Ht _0  (1.753 m)   Wt 164 lb 0.8 oz (74.4 kg)   SpO2 97%   BMI 24.23 kg/m  Wt Readings from Last 3 Encounters:  08/21/20 164 lb 0.8 oz (74.4 kg)  08/19/20 166 lb (75.3 kg)  07/12/20 164 lb (74.4 kg)     There are no preventive care reminders to display for this patient.   There are no preventive care reminders to display for this patient.  Lab Results  Component Value Date   TSH 0.073 (L) 05/23/2020   Lab Results  Component Value Date   WBC 9.7 07/12/2020   HGB 12.3 (L) 07/12/2020   HCT 40.1 07/12/2020   MCV 80.5 07/12/2020   PLT 187 07/12/2020   Lab Results  Component Value Date   NA 140 07/12/2020   K 3.9 07/12/2020   CO2 20 (L) 07/12/2020   GLUCOSE 123 (H) 07/12/2020   BUN 19 07/12/2020   CREATININE 1.61 (H) 07/12/2020   BILITOT 0.5 07/12/2020   ALKPHOS 89 07/12/2020   AST 16 07/12/2020   ALT 22 07/12/2020   PROT 6.7 07/12/2020   ALBUMIN 3.6 07/12/2020   CALCIUM 9.0 07/12/2020   ANIONGAP 7 07/12/2020   EGFR 55 (L) 05/23/2020   Lab Results  Component Value Date   CHOL 118 05/23/2020   Lab Results  Component Value Date   HDL 29 (L) 05/23/2020   Lab Results  Component Value Date   LDLCALC 68 05/23/2020   Lab Results  Component Value Date   TRIG 116 05/23/2020   Lab Results  Component  Value Date   CHOLHDL 4.1 05/23/2020   Lab Results  Component Value Date   HGBA1C 5.6 05/23/2020      Assessment & Plan:   Problem List Items Addressed This Visit       Cardiovascular and Mediastinum   Essential hypertension Stable. Continue with current regimen.  No changes warranted. Good patient compliance.    Relevant Orders   Urinalysis Dipstick (Completed)     Endocrine   Hypothyroidism TSH low 3 months ago will be evaluated for dose changes   Relevant Orders   TSH   T3   T4, free   Other Visit Diagnoses     Episode of  recurrent major depressive disorder, unspecified depression episode severity ([facility])    -  Primary Stable we will continue with Remeron at current dose continue to evaluate any additional changes   Urinary hesitancy  Worsening trial tamsulosin 0.4 mg daily follow-up in 3 months further evaluation      Relevant Orders   PSA        Meds ordered this encounter  Medications   tamsulosin (FLOMAX) 0.4 MG CAPS capsule    Sig: Take 1 capsule (0.4 mg total) by mouth daily.    Dispense:  90 capsule    Refill:  3    Order Specific Question:   Supervising Provider    Answer:   Tresa Garter [[id number]]    Follow-up: Return in about 3 months (around 11/21/2020).    Vevelyn Francois, NP I

## 2020-08-21 NOTE — Patient Instructions (Addendum)
Major Depressive Disorder, Adult Major depressive disorder is a mental health condition. This disorder affects feelings. It can also affect the body. Symptoms of this condition last most of the day, almost every day, for 2 weeks. This disorder can affect: Relationships. Daily activities, such as work and school. Activities that you normally like to do. What are the causes? The cause of this condition is not known. The disorder is likely caused by a mix of things, including: Your personality, such as being a shy person. Your behavior, or how you act toward others. Your thoughts and feelings. Too much alcohol or drugs. How you react to stress. Health and mental problems that you have had for a long time. Things that hurt you in the past (trauma). Big changes in your life, such as divorce. What increases the risk? The following factors may make you more likely to develop this condition: Having family members with depression. Being a woman. Problems in the family. Low levels of some brain chemicals. Things that caused you pain as a child, especially if you lost a parent or were abused. A lot of stress in your life, such as from: Living without basic needs of life, such as food and shelter. Being treated poorly because of race, sex, or religion (discrimination). Health and mental problems that you have had for a long time. What are the signs or symptoms? The main symptoms of this condition are: Being sad all the time. Being grouchy all the time. Loss of interest in things and activities. Other symptoms include: Sleeping too much or too little. Eating too much or too little. Gaining or losing weight, without knowing why. Feeling tired or having low energy. Being restless and weak. Feeling hopeless, worthless, or guilty. Trouble thinking clearly or making decisions. Thoughts of hurting yourself or others, or thoughts of ending your life. Spending a lot of time alone. Inability to  complete common tasks of daily life. If you have very bad MDD, you may: Believe things that are not true. Hear, see, taste, or feel things that are not there. Have mild depression that lasts for at least 2 years. Feel very sad and hopeless. Have trouble speaking or moving. How is this treated? This condition may be treated with: Talk therapy. This teaches you to know bad thoughts, feelings, and actions and how to change them. This can also help you to communicate with others. This can be done with members of your family. Medicines. These can be used to treat worry (anxiety), depression, or low levels of chemicals in the brain. Lifestyle changes. You may need to: Limit alcohol use. Limit drug use. Get regular exercise. Get plenty of sleep. Make healthy eating choices. Spend more time outdoors. Brain stimulation. This treatment excites the brain. This is done when symptoms are very bad or have not gotten better with other treatments. Follow these instructions at home: Activity Get regular exercise as told. Spend time outdoors as told. Make time to do the things you enjoy. Find ways to deal with stress. Try to: Meditate. Do deep breathing. Spend time in nature. Keep a journal. Return to your normal activities as told by your doctor. Ask your doctor what activities are safe for you. Alcohol and drug use If you drink alcohol: Limit how much you use to: 0-1 drink a day for women. 0-2 drinks a day for men. Be aware of how much alcohol is in your drink. In the [place] one drink equals one 12 oz bottle of beer (355 mL),  one 5 oz glass of wine (148 mL), or one 1 oz glass of hard liquor (44 mL). Talk to your doctor about: Alcohol use. Alcohol can affect some medicines. Any drug use. General instructions  Take over-the-counter and prescription medicines and herbal preparations only as told by your doctor. Eat a healthy diet. Get a lot of sleep. Think about joining a support group.  Your doctor may be able to suggest one. Keep all follow-up visits as told by your doctor. This is important.  Where to find more information: [employer] on Mental Illness: www.nami.[place]: https://carter.com/ American Psychiatric Association: www.psychiatry.org/patients-families/ Contact a doctor if: Your symptoms get worse. You get new symptoms. Get help right away if: You hurt yourself. You have serious thoughts about hurting yourself or others. You see, hear, taste, smell, or feel things that are not there. If you ever feel like you may hurt yourself or others, or have thoughts about taking your own life, get help right away. Go to your nearest emergency department or: Call your local emergency services (911 in the [place] Call a suicide crisis helpline, such as the [facility] at 418-645-8485. This is open 24 hours a day in the [place] Text the Crisis Text Line at 5597571893 (in the [place].). Summary Major depressive disorder is a mental health condition. This disorder affects feelings. Symptoms of this condition last most of the day, almost every day, for 2 weeks. The symptoms of this disorder can cause problems with relationships and with daily activities. There are treatments and support for people who get this disorder. You may need more than one type of treatment. Get help right away if you have serious thoughts about hurting yourself or others. This information is not intended to replace advice given to you by your health care provider. Make sure you discuss any questions you have with your healthcare provider. Document Revised: 01/27/2019 Document Reviewed: 01/27/2019 Elsevier Patient Education  2022 [employer].

## 2020-08-22 ENCOUNTER — Other Ambulatory Visit: Payer: Self-pay

## 2020-08-22 ENCOUNTER — Other Ambulatory Visit: Payer: Self-pay | Admitting: Nurse Practitioner

## 2020-08-22 ENCOUNTER — Telehealth: Payer: Self-pay | Admitting: Nurse Practitioner

## 2020-08-22 LAB — TSH: TSH: 0.134 u[IU]/mL — ABNORMAL LOW (ref 0.450–4.500)

## 2020-08-22 LAB — PSA: Prostate Specific Ag, Serum: 4.6 ng/mL — ABNORMAL HIGH (ref 0.0–4.0)

## 2020-08-22 LAB — T3: T3, Total: 124 ng/dL (ref 71–180)

## 2020-08-22 LAB — T4, FREE: Free T4: 1.54 ng/dL (ref 0.82–1.77)

## 2020-08-22 MED ORDER — LEVOTHYROXINE SODIUM 112 MCG PO TABS
112.0000 ug | ORAL_TABLET | Freq: Every day | ORAL | 3 refills | Status: DC
  Filled 2020-08-22: qty 90, 90d supply, fill #0
  Filled 2020-12-29: qty 90, 90d supply, fill #1
  Filled 2021-04-01: qty 90, 90d supply, fill #2
  Filled 2021-04-01: qty 90, 90d supply, fill #0
  Filled 2021-07-03: qty 90, 90d supply, fill #1

## 2020-08-22 NOTE — Telephone Encounter (Signed)
Left voice mail to call back 

## 2020-08-22 NOTE — Telephone Encounter (Signed)
-----   Message from Vevelyn Francois, NP sent at 08/22/2020  6:55 AM EDT ----- TSH slightly low will decrease levothyroxine 112 mcg daily new rx sent PSA slightly elevated at 4.6 will repeat with next follow up visit. New medication as directed if ineffective urology referral will be recommended thanks

## 2020-08-25 ENCOUNTER — Other Ambulatory Visit: Payer: Self-pay

## 2020-09-10 ENCOUNTER — Other Ambulatory Visit: Payer: Self-pay

## 2020-09-10 MED FILL — Metoprolol Tartrate Tab 25 MG: ORAL | 90 days supply | Qty: 180 | Fill #0 | Status: AC

## 2020-09-22 ENCOUNTER — Ambulatory Visit: Payer: Medicare Other | Admitting: Gastroenterology

## 2020-09-29 ENCOUNTER — Other Ambulatory Visit: Payer: Self-pay

## 2020-09-29 MED FILL — Amlodipine Besylate Tab 10 MG (Base Equivalent): ORAL | 90 days supply | Qty: 90 | Fill #1 | Status: AC

## 2020-09-29 MED FILL — Isosorbide Mononitrate Tab ER 24HR 30 MG: ORAL | 90 days supply | Qty: 90 | Fill #1 | Status: AC

## 2020-09-29 MED FILL — Atorvastatin Calcium Tab 40 MG (Base Equivalent): ORAL | 90 days supply | Qty: 90 | Fill #1 | Status: AC

## 2020-09-30 ENCOUNTER — Other Ambulatory Visit: Payer: Self-pay

## 2020-10-01 ENCOUNTER — Other Ambulatory Visit: Payer: Self-pay | Admitting: Nurse Practitioner

## 2020-10-01 ENCOUNTER — Other Ambulatory Visit: Payer: Self-pay

## 2020-10-01 DIAGNOSIS — I10 Essential (primary) hypertension: Secondary | ICD-10-CM

## 2020-10-08 ENCOUNTER — Other Ambulatory Visit: Payer: Self-pay

## 2020-10-21 ENCOUNTER — Other Ambulatory Visit: Payer: Self-pay

## 2020-10-27 ENCOUNTER — Other Ambulatory Visit: Payer: Self-pay

## 2020-11-04 ENCOUNTER — Other Ambulatory Visit: Payer: Self-pay

## 2020-11-06 ENCOUNTER — Ambulatory Visit: Payer: Medicare Other | Admitting: Cardiovascular Disease

## 2020-11-06 ENCOUNTER — Other Ambulatory Visit: Payer: Self-pay

## 2020-11-06 ENCOUNTER — Encounter: Payer: Self-pay | Admitting: Cardiovascular Disease

## 2020-11-06 VITALS — BP 115/62 | HR 54 | Ht 69.0 in | Wt 167.8 lb

## 2020-11-06 DIAGNOSIS — N200 Calculus of kidney: Secondary | ICD-10-CM | POA: Diagnosis not present

## 2020-11-06 DIAGNOSIS — Z85819 Personal history of malignant neoplasm of unspecified site of lip, oral cavity, and pharynx: Secondary | ICD-10-CM

## 2020-11-06 DIAGNOSIS — E785 Hyperlipidemia, unspecified: Secondary | ICD-10-CM | POA: Diagnosis not present

## 2020-11-06 DIAGNOSIS — I2510 Atherosclerotic heart disease of native coronary artery without angina pectoris: Secondary | ICD-10-CM | POA: Diagnosis not present

## 2020-11-06 DIAGNOSIS — I10 Essential (primary) hypertension: Secondary | ICD-10-CM

## 2020-11-06 NOTE — Patient Instructions (Signed)
Medication Instructions:  The current medical regimen is effective;  continue present plan and medications.  *If you need a refill on your cardiac medications before your next appointment, please call your pharmacy*   Follow-Up: At [facility], you and your health needs are our priority.  As part of our continuing mission to provide you with exceptional heart care, we have created designated Provider Care Teams.  These Care Teams include your primary Cardiologist (physician) and Advanced Practice Providers (APPs -  Physician Assistants and Nurse Practitioners) who all work together to provide you with the care you need, when you need it.  We recommend signing up for the patient portal called "MyChart".  Sign up information is provided on this After Visit Summary.  MyChart is used to connect with patients for Virtual Visits (Telemedicine).  Patients are able to view lab/test results, encounter notes, upcoming appointments, etc.  Non-urgent messages can be sent to your provider as well.   To learn more about what you can do with MyChart, go to [email or web address].    Your next appointment:   6 month(s)  The format for your next appointment:   In Person  Provider:   Shelva Majestic, MD

## 2020-11-06 NOTE — Progress Notes (Signed)
Cardiology Office Note    Date:  11/06/2020   ID:  [name] [name], DOB [birth year], MRN [id number]  PCP:  [name] Francois, NP  Cardiologist:  [name] Majestic, MD   No chief complaint on file.  6 month F/U ; initially referred through the courtesy of Dr. Karsten [name] for preoperative clearance following demonstration of an abnormal ECG.  History of Present Illness:  [name] [name] is a 65 y.o. male who was initially referred in November 2018 for preoperative clearance by Dr. Karsten [name] prior to undergoing urologic surgery.  I last saw him in March 2022.  [name] [name] presents for a 22-monthfollow-up evaluation.    Mr. [name] [name] a remote history of throat cancer and underwent chemotherapy and radiation therapy.  [name] [name] has a history of hypertension, as well as long-standing tobaccouse.  [name] [name] started smoking at age [age]and was smoking up to 3 packs per day for many years.  Recently, [name] [name] is smoking one half pack per day.  [name] [name] has a history of hypothyroidism and is on levothyroxine.  [name] [name] has a history of hypertension, and most recently has been on amlodipine 10 mg, hydralazine 75 mg every 8 hours, and metoprolol 12.5 mg twice a day.  [name] [name] denies any recent definitive chest pain but [name] [name] does admit that [name] [name] is unable to walk much due to right hip discomfort.  September, [name] [name] been hospitalized with left flank pain.  [name] [name] was found to have nephro lithiasis with left hydronephrosis.  [name] [name] underwent left percutaneous nephrostomy.  [name] [name] is in need for additional surgery was initially scheduled to be October 22, but when [name] [name] presented on October 19 for preoperative clearance his surgery was canceled after his ECG revealed anterolateral T-wave changes.    When I saw him for initial cardiology evaluation I scheduled him for 2D echo Doppler study to further evaluate his systolic murmur as well as systolic and diastolic function with his history of hypertension on a 3 drug regimen.  I also schedule him for a nuclear study to assess potential ischemic  etiology to his ECG changes.  His echo Doppler study showed an EF of 55-60%.  [name] [name] had normal diastolic parameters.  There was mild mitral regurgitation.  His nuclear stress test was abnormal and interpreted as high risk demonstrating a medium defect of moderate severity in the mid anteroseptal, apical anterior, apical septal and apical location with mild peri-infarction ischemia.  EF was 49%.   On 01/18/2017.  I performed cardiac catheterization which showed preserved LV function with an EF of 55-60% with very subtle mid anterolateral and mid inferior hypocontractility.  [name] [name] was found to have multivessel CAD with a long subtotal LAD stenosis after the first diagonal and septal vessels, but with significant collateralization arising from the diagonal vessel to the apical LAD leading to retrograde filling of the LAD distally approximately as well as diffuse 90% distal LAD stenosis.  There was a 40% ostial proximal OM1 stenosis, 30% on 2 stenosis and [name] [name] had a large dominant RCA with focal 85% stenosis distally in the continuation branch.  After the PDA vessel.  Since Mr. [name] Hiltsdid not have any chest pain and his ECG showed T-wave abnormalities consistent with a subtotal LAD stenosis which was collateralized.  At that time, I gave him clearance for surgery and did not perform any intervention so as to avoid preoperative dual antiplatelet therapy.  [name] [name] saw [name] Deforest PA in the office in follow-up and was started on Lipitor 20 g daily. [name] [name] tolerated  his urologic surgery successfully without cardiovascular compromise.    I saw him in March 2019 at which time [name] [name] was cardiac stable without chest pain, PND orthopnea.  Unfortunately [name] [name] was continued to smoke cigarettes but had reduced his smoking from 3 packs/day to 1/2 pack/day.    After not having seen him in over 3 years, I saw him in March 2022 for follow-up evaluation.  At that time [name] [name] continued to smoke cigarettes despite his history of throat cancer.  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  [name] [name] had noticed shortness of breath with activity.  [name] [name] denied palpitations or chest pain.  [name] [name] has been on amlodipine 10 mg, isosorbide 30 mg, metoprolol tartrate 25 mg twice a day for hypertension and CAD.  [name] [name] is on levothyroxine 137 mcg for hypothyroidism.  [name] [name] is on Symbicort for COPD.  During that evaluation, I recommended that [name] [name] institute baby aspirin 81 mg.  I also recommended him undergo a follow-up echo Doppler study to reassess LV systolic and diastolic function as well as laboratory.  His echo Doppler study performed on June 18, 2020 showed normal LV systolic and diastolic function with EF at 60 to 65%.  There is mild LA dilation and mild to moderate aortic sclerosis without stenosis.  [name] [name] underwent an ER evaluation at [facility] in May 2022 with kidney stones.  [name] [name] continues to smoke 5 to 6 pack of cigarettes per day.  [name] [name] denies recurrent angina.  [name] [name] presents for evaluation  Past Medical History:  Diagnosis Date   Arthritis    Chronic right ear pain 07/2019   Coronary artery disease    Depression 06/12/11   Buproprion per Dr. Lamonte Sakai   Drainage from ear, right 07/2019   Dyspnea    Full dentures    Fitting Per Dr. 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 of Cisplatin and Dhydrtion   Skin rash 01/2019   Smoking 06/12/11   1/2 pack/day   Status post chemotherapy 10/30/10 - 11/09/10    2 doses of Q 3 week Cisplatin   Status post radiation therapy 10/11/10 - 12/24/10   Bilateral Neck and Mucosa axis /  70 gray in 35 fractions   Xerostomia     Past Surgical History:  Procedure Laterality Date   APPENDECTOMY     BIOPSY TONGUE     TonsilandBaseoftongue   HERNIA REPAIR     umbilical with mesh   IR NEPHROSTOMY EXCHANGE LEFT  01/10/2017   IR NEPHROSTOMY PLACEMENT LEFT  11/15/2016   LEFT  HEART CATH AND CORONARY ANGIOGRAPHY N/A 01/18/2017   Procedure: LEFT HEART CATH AND CORONARY ANGIOGRAPHY;  Surgeon: [name], MD;  Location: [facility];  Service: Cardiovascular;  Laterality: N/A;   NEPHROLITHOTOMY Left 02/11/2017   Procedure: NEPHROLITHOTOMY PERCUTANEOUS;  Surgeon: [name], MD;  Location: [facility];  Service: Urology;  Laterality: Left;   PEG TUBE REMOVAL  04/13/11   TONSILLECTOMY     right side    Current Medications: Outpatient Medications Prior to Visit  Medication Sig Dispense Refill   albuterol (VENTOLIN HFA) 108 (90 Base) MCG/ACT inhaler INHALE 2 PUFFS INTO THE LUNGS EVERY 4 (FOUR) HOURS AS NEEDED FOR WHEEZING OR SHORTNESS OF BREATH (COUGH, SHORTNESS OF BREATH OR WHEEZING.).  18 g 11   amLODipine (NORVASC) 10 MG tablet TAKE 1 TABLET (10 MG TOTAL) BY MOUTH DAILY. 90 tablet 3   atorvastatin (LIPITOR) 40 MG tablet TAKE 1 TABLET (40 MG TOTAL) BY MOUTH DAILY. 90 tablet 3   budesonide-formoterol (SYMBICORT) 80-4.5 MCG/ACT inhaler INHALE 2 PUFFS INTO THE LUNGS 2 (TWO) TIMES DAILY. 10.2 g 11   hydrALAZINE (APRESOLINE) 25 MG tablet TAKE 2 TABLETS (50 MG TOTAL) BY MOUTH EVERY 8 (EIGHT) HOURS. 180 tablet 3   HYDROcodone-acetaminophen (NORCO/VICODIN) 5-325 MG tablet Take 1 tablet by mouth every 4 (four) hours as needed. 6 tablet 0   hydrocortisone cream 1 % Apply 1 application topically 2 (two) times daily. 30 g 6   levothyroxine (SYNTHROID) 112 MCG tablet Take 1 tablet (112 mcg total) by mouth daily. 90 tablet 3   metoprolol tartrate (LOPRESSOR) 25 MG tablet TAKE 1 TABLET (25 MG TOTAL) BY MOUTH 2 (TWO) TIMES DAILY. 180 tablet 3   mirtazapine (REMERON SOL-TAB) 15 MG disintegrating tablet Take 1 tablet (15 mg total) by mouth at bedtime. 90 tablet 3   tamsulosin (FLOMAX) 0.4 MG CAPS capsule Take 1 capsule (0.4 mg total) by mouth daily. 90 capsule 3   Vitamin D, Ergocalciferol, (DRISDOL) 1.25 MG (50000 UNIT) CAPS capsule TAKE 1 CAPSULE (50,000 UNITS TOTAL) BY MOUTH  EVERY 7 (SEVEN) DAYS. 5 capsule 6   No facility-administered medications prior to visit.     Allergies:   Codeine   Social History   Socioeconomic History   Marital status: Single    Spouse name: Not on file   Number of children: Not on file   Years of education: Not on file   Highest education level: Not on file  Occupational History   Not on file  Tobacco Use   Smoking status: Every Day    Packs/day: 0.50    Years: 25.00    Pack years: 12.50    Types: Cigarettes   Smokeless tobacco: Never   Tobacco comments:    Smoking 4-5 cigarettes daily on average  Vaping Use   Vaping Use: Never used  Substance and Sexual Activity   Alcohol use: No    Alcohol/week: 0.0 standard drinks   Drug use: No   Sexual activity: Not Currently  Other Topics Concern   Not on file  Social History Narrative   Not on file   Social Determinants of Health   Financial Resource Strain: Low Risk    Difficulty of Paying Living Expenses: Not hard at all  Food Insecurity: No Food Insecurity   Worried About Charity fundraiser in the Last Year: Never true   Ran Out of Food in the Last Year: Never true  Transportation Needs: No Transportation Needs   Lack of Transportation (Medical): No   Lack of Transportation (Non-Medical): No  Physical Activity: Inactive   Days of Exercise per Week: 0 days   Minutes of Exercise per Session: 0 min  Stress: Stress Concern Present   Feeling of Stress : To some extent  Social Connections: Socially Isolated   Frequency of Communication with Friends and Family: More than three times a week   Frequency of Social Gatherings with Friends and Family: Twice a week   Attends Religious Services: Never   Marine scientist or Organizations: No   Attends Music therapist: Not on file   Marital Status: Widowed    Additional social history is notable that [name] [name] is divorced and lives with one of his daughters.  [name] [name] has 3 children and 6 grandchildren.  [name] [name] worked  for [employer] but is retired.there is a long-standing tobacco history since age 24. [name] [name] does not exercise.  Family History:  The patient's family history includes Cancer (age of onset: 105) in his sister; Cancer (age of onset: 43) in his father.  His mother died at age 66.  Father died at age 36 and had diabetes and CAD.  [name] [name] has 2 brothers, 2 living sisters, and one sister who died at age 67 with some cancer of her blood.  [name] [name] has 3 children.  ROS General: Negative; No fevers, chills, or night sweats;  HEENT: Negative; No changes in vision or hearing, sinus congestion; history of throat cancer Pulmonary: Negative; No cough, wheezing, shortness of breath, hemoptysis Cardiovascular: Negative; No chest pain, presyncope, syncope, palpitations GI: Negative; No nausea, vomiting, diarrhea, or abdominal pain GU: h/o left-sided flank pain, kidney stones, and hydronephrosis; that is post urology surgery and removal of his kidney stone. Musculoskeletal: hip discomfort Hematologic/Oncology: Negative; no easy bruising, bleeding Endocrine: Negative; no heat/cold intolerance; no diabetes Neuro: Negative; no changes in balance, headaches Skin: Negative; No rashes or skin lesions Psychiatric: Negative; No behavioral problems, depression Sleep: Negative; No snoring, daytime sleepiness, hypersomnolence, bruxism, restless legs, hypnogognic hallucinations, no cataplexy Other comprehensive 14 point system review is negative.   PHYSICAL EXAM:   VS:  BP 115/62   Pulse (!) 54   Ht _0  (1.753 m)   Wt 167 lb 12.8 oz (76.1 kg)   SpO2 95%   BMI 24.78 kg/m     Repeat blood pressure by me was 118/64.  Wt Readings from Last 3 Encounters:  11/06/20 167 lb 12.8 oz (76.1 kg)  08/21/20 164 lb 0.8 oz (74.4 kg)  08/19/20 166 lb (75.3 kg)    General: Alert, oriented, no distress.  Skin: normal turgor, no rashes, warm and dry HEENT: Normocephalic, atraumatic. Pupils equal round and reactive to light; sclera  anicteric; extraocular muscles intact;  Nose without nasal septal hypertrophy Mouth/Parynx benign; Mallinpatti scale 3 Neck: No JVD, no carotid bruits; normal carotid upstroke Lungs: clear to ausculatation and percussion; no wheezing or rales Chest wall: without tenderness to palpitation Heart: PMI not displaced, RRR, s1 s2 normal, 1/6 systolic murmur, no diastolic murmur, no rubs, gallops, thrills, or heaves Abdomen: soft, nontender; no hepatosplenomehaly, BS+; abdominal aorta nontender and not dilated by palpation. Back: no CVA tenderness Pulses 2+ Musculoskeletal: full range of motion, normal strength, no joint deformities Extremities: no clubbing cyanosis or edema, Homan's sign negative  Neurologic: grossly nonfocal; Cranial nerves grossly wnl Psychologic: Normal mood and affect   Studies/Labs Reviewed:   EKG:  EKG is ordered today. ECG (independently read by me): Sinus bradycardia at 53 bpm.  No ectopy.  Normal intervals.  May 23, 2020 ECG (independently read by me): Sinus bradycardia at 50     March 2019 ECG (independently read by me): Sinus rhythm with mild sinus arrhythmia at 86 bpm.  No significant ST-T changes.  QTc interval 476 ms.  PR interval 14 2 ms.  January 11, 2017 ECG (independently read by me): Normal sinus rhythm at 65 bpm.  Nonspecific T-wave abnormality anteriorly. Normal intervals.  12/21/2016 ECG (independently read by me): Normal sinus rhythm at 63 bpm.  T-wave abnormality anteriorly V1 through V5.  Normal intervals.  No ectopy.  Recent Labs: BMP Latest Ref Rng & Units 07/12/2020 05/23/2020 12/12/2019  Glucose 70 - 99 mg/dL 123(H) 91 126(H)  BUN 8 - 23  mg/dL _0 Creatinine 0.61 - 1.24 mg/dL 1.61(H) 1.41(H) 1.62(H)  BUN/Creat Ratio 10 - 24 - 10 -  Sodium 135 - 145 mmol/L 140 142 140  Potassium 3.5 - 5.1 mmol/L 3.9 4.2 4.5  Chloride 98 - 111 mmol/L 113(H) 107(H) 111  CO2 22 - 32 mmol/L 20(L) 18(L) 20(L)  Calcium 8.9 - 10.3 mg/dL 9.0 8.8 8.9      Hepatic Function Latest Ref Rng & Units 07/12/2020 05/23/2020 12/12/2019  Total Protein 6.5 - 8.1 g/dL 6.7 6.5 7.1  Albumin 3.5 - 5.0 g/dL 3.6 3.9 3.8  AST 15 - 41 U/L _1 ALT 0 - 44 U/L _2 Alk Phosphatase 38 - 126 U/L 89 109 94  Total Bilirubin 0.3 - 1.2 mg/dL 0.5 0.3 0.5    CBC Latest Ref Rng & Units 07/12/2020 05/23/2020 12/12/2019  WBC 4.0 - 10.5 K/uL 9.7 10.6 14.1(H)  Hemoglobin 13.0 - 17.0 g/dL 12.3(L) 13.1 14.8  Hematocrit 39.0 - 52.0 % 40.1 40.5 46.7  Platelets 150 - 400 K/uL 187 200 193   Lab Results  Component Value Date   MCV 80.5 07/12/2020   MCV 79 05/23/2020   MCV 83.5 12/12/2019   Lab Results  Component Value Date   TSH 0.134 (L) 08/21/2020   Lab Results  Component Value Date   HGBA1C 5.6 05/23/2020     BNP No results found for: BNP  ProBNP No results found for: PROBNP   Lipid Panel     Component Value Date/Time   CHOL 118 05/23/2020 1415   TRIG 116 05/23/2020 1415   HDL 29 (L) 05/23/2020 1415   CHOLHDL 4.1 05/23/2020 1415   CHOLHDL 6.2 (H) 01/03/2017 1108   VLDL 36 (H) 07/18/2015 1005   LDLCALC 68 05/23/2020 1415   LDLCALC 120 (H) 01/03/2017 1108     RADIOLOGY: No results found.   Additional studies/ records that were reviewed today include:   CATH ; 11/20/218 Post Atrio lesion is 85% stenosed. Ost 1st Mrg lesion is 40% stenosed. 1st Mrg lesion is 40% stenosed. Ost 2nd Mrg to 2nd Mrg lesion is 30% stenosed. Prox LAD to Mid LAD lesion is 95% stenosed. Mid LAD to Dist LAD lesion is 90% stenosed. The left ventricular systolic function is normal. LV end diastolic pressure is normal. The left ventricular ejection fraction is 55-65% by visual estimate.   Preserved global LV contractility with ejection fraction of 55-60% with very subtle mid anterolateral and mid inferior hypocontractility.   Multivessel CAD with long subtotal LAD stenosis after the first diagonal and septal perforating artery with significant  collateralization arising from the diagonal vessel to the apical LAD with retrograde filling of the LAD distally to proximally and evidence for an additional diffuse 90% distal LAD stenosis; 40% ostial proximal OM1 stenosis, 30% OM 2 stenosis of the left circumflex coronary artery; and large dominant RCA with a focal 85% continuation branch stenosis after the PDA vessel.    RECOMMENDATION: [name] [name] has not had any chest pain.  His ECG reveals anterolateral T wave abnormalities consistent with his subtotal LAD stenosis, which is collateralized extensively in a retrograde fashion from a large first diagonal vessel.  On his most recent nuclear study there was no ischemia in the inferior wall; however, [name] [name] has a focal 85% continuation branch stenosis of his RCA after the PDA vessel and there are 2 small inferior LV branches and a posterior lateral vessel which arises distally beyond this stenosis.  Initially, the patient will be treated medically and I have contacted Dr. Karsten [name].  So as to avoid preoperative dual antiplatelet therapy plans can be made for his urologic surgery to remove his renal stone with plans for delayed PCI to his RCA postoperatively, when long-term antiplatelet therapy can be administered.     -------------------------------------------------------------------  ECHO Study Conclusions 12/30/2019  - Left ventricle: The cavity size was normal. There was mild    concentric hypertrophy. Systolic function was normal. The    estimated ejection fraction was in the range of 55% to 60%. Wall    motion was normal; there were no regional wall motion    abnormalities. Left ventricular diastolic function parameters    were normal.  - Aortic valve: Transvalvular velocity was within the normal range.    There was no stenosis. There was no regurgitation.  - Mitral valve: Transvalvular velocity was within the normal range.    There was no evidence for stenosis. There was mild regurgitation.   - Left atrium: The atrium was mildly dilated.  - Right ventricle: The cavity size was normal. Wall thickness was    normal. Systolic function was normal.  - Atrial septum: No defect or patent foramen ovale was identified    by color flow Doppler.  - Tricuspid valve: There was no regurgitation   ASSESSMENT:    1. Essential hypertension   2. CAD in native artery   3. Hyperlipidemia with target LDL less than 70   4. History of throat cancer   5. Kidney stones     PLAN:  [name] [name] is a 65 year old male who has a long-standing history of tobacco use, as well as a several year history of hypertension, hypothyroidism and stage III chronic kidney disease.  [name] [name] developed throat cancer and underwent chemotherapy and radiation and still smokes cigarettes.  When I saw him in 2018 [name] [name] had developed kidney stones resulting  hydronephrosis requiring percutaneous nephrostomy.  As part of a preoperative evaluation for follow-up urology surgery T wave abnormality anteriorly on ECG preoperative testing.  His echo Doppler study revealed normal systolic function and normal diastolic parameters.  There was mild mitral regurgitation.  His nuclear study showed hypoperfusion involving the mid anteroseptal, apical anterior, apical septal, and apical locations.  There was mild peri-infarction ischemia.  As result, [name] [name] underwent diagnostic catheterization which revealed a subtotally stenosed LAD which was his LAD well collateralized and this was responsible for the ischemia noted on nuclear imaging.  [name] [name] does have a focal distal RCA stenosis beyond the PDA vessel in the continuation branch.  On medical therapy.  [name] [name] has been asymptomatic.  I further titrated his metoprolol and [name] [name] continued to be on amlodipine as well as hydralazine.  His blood pressure today is well controlled on his regimen consisting of amlodipine 10 mg, hydralazine 50 mg every 8 hours, metoprolol tartrate 25 mg twice a day.  [name] [name] is on atorvastatin 40 mg  and most recent LDL cholesterol was at target at 68.  [name] [name] has hypothyroidism and is on levothyroxine 112 mcg.  [name] [name] has probable COPD and is on Symbicort in addition to ProAir.  I again discussed the importance of complete smoking cessation particularly with his prior throat cancer.  [name] [name] had recent kidney stones which has stabilized.  His ECG remains unremarkable.  I reviewed his echo Doppler findings from April 2022 which shows normal LV systolic and diastolic function with EF at 60 to 65%.  There was mild to moderate aortic sclerosis  without stenosis.  [name] [name] will continue his current regimen.  I will see him in 6 months for reevaluation or sooner as needed.   Medication Adjustments/Labs and Tests Ordered: Current medicines are reviewed at length with the patient today.  Concerns regarding medicines are outlined above.  Medication changes, Labs and Tests ordered today are listed in the Patient Instructions below. Patient Instructions  Medication Instructions:  The current medical regimen is effective;  continue present plan and medications.  *If you need a refill on your cardiac medications before your next appointment, please call your pharmacy*   Follow-Up: At [facility], you and your health needs are our priority.  As part of our continuing mission to provide you with exceptional heart care, we have created designated Provider Care Teams.  These Care Teams include your primary Cardiologist (physician) and Advanced Practice Providers (APPs -  Physician Assistants and Nurse Practitioners) who all work together to provide you with the care you need, when you need it.  We recommend signing up for the patient portal called "MyChart".  Sign up information is provided on this After Visit Summary.  MyChart is used to connect with patients for Virtual Visits (Telemedicine).  Patients are able to view lab/test results, encounter notes, upcoming appointments, etc.  Non-urgent messages can be sent to your  provider as well.   To learn more about what you can do with MyChart, go to [email or web address].    Your next appointment:   6 month(s)  The format for your next appointment:   In Person  Provider:   Shelva Majestic, MD     Signed, [name] Majestic, MD  11/06/2020 4:41 PM    [facility] [address], [place], [place], [place]  [address] Phone: [phone]

## 2020-11-24 ENCOUNTER — Ambulatory Visit: Payer: Self-pay | Admitting: Nurse Practitioner

## 2020-12-01 ENCOUNTER — Other Ambulatory Visit: Payer: Self-pay

## 2020-12-01 ENCOUNTER — Other Ambulatory Visit: Payer: Self-pay | Admitting: Nurse Practitioner

## 2020-12-01 DIAGNOSIS — I10 Essential (primary) hypertension: Secondary | ICD-10-CM

## 2020-12-01 MED ORDER — HYDRALAZINE HCL 25 MG PO TABS
ORAL_TABLET | ORAL | 3 refills | Status: DC
  Filled 2020-12-01: qty 180, 30d supply, fill #0

## 2020-12-02 ENCOUNTER — Other Ambulatory Visit: Payer: Self-pay

## 2020-12-11 ENCOUNTER — Encounter: Payer: Self-pay | Admitting: Nurse Practitioner

## 2020-12-11 ENCOUNTER — Ambulatory Visit (INDEPENDENT_AMBULATORY_CARE_PROVIDER_SITE_OTHER): Payer: Medicare Other | Admitting: Nurse Practitioner

## 2020-12-11 ENCOUNTER — Ambulatory Visit (HOSPITAL_COMMUNITY)
Admission: RE | Admit: 2020-12-11 | Discharge: 2020-12-11 | Disposition: A | Discharge status: Home / Self Care | Payer: Medicare Other | Source: Ambulatory Visit | Attending: Nurse Practitioner | Admitting: Nurse Practitioner

## 2020-12-11 ENCOUNTER — Other Ambulatory Visit: Payer: Self-pay

## 2020-12-11 VITALS — BP 93/68 | HR 53 | Temp 97.4°F | Ht 69.0 in | Wt 168.0 lb

## 2020-12-11 DIAGNOSIS — I10 Essential (primary) hypertension: Secondary | ICD-10-CM | POA: Diagnosis not present

## 2020-12-11 DIAGNOSIS — K639 Disease of intestine, unspecified: Secondary | ICD-10-CM

## 2020-12-11 DIAGNOSIS — R0602 Shortness of breath: Secondary | ICD-10-CM

## 2020-12-11 DIAGNOSIS — R109 Unspecified abdominal pain: Secondary | ICD-10-CM | POA: Diagnosis not present

## 2020-12-11 DIAGNOSIS — K5900 Constipation, unspecified: Secondary | ICD-10-CM | POA: Insufficient documentation

## 2020-12-11 DIAGNOSIS — G8929 Other chronic pain: Secondary | ICD-10-CM

## 2020-12-11 DIAGNOSIS — R21 Rash and other nonspecific skin eruption: Secondary | ICD-10-CM | POA: Diagnosis not present

## 2020-12-11 DIAGNOSIS — K648 Other hemorrhoids: Secondary | ICD-10-CM | POA: Diagnosis not present

## 2020-12-11 MED ORDER — METOPROLOL TARTRATE 25 MG PO TABS
ORAL_TABLET | Freq: Two times a day (BID) | ORAL | 3 refills | Status: DC
  Filled 2020-12-11: qty 180, 90d supply, fill #0
  Filled 2021-04-16: qty 180, 90d supply, fill #1
  Filled 2021-04-17 – 2021-04-28 (×3): qty 180, 90d supply, fill #0
  Filled 2021-08-21: qty 180, 90d supply, fill #1

## 2020-12-11 MED ORDER — HYDROCORTISONE 1 % EX CREA
1.0000 | TOPICAL_CREAM | Freq: Two times a day (BID) | CUTANEOUS | 6 refills | Status: DC
Start: 2020-12-11 — End: 2021-06-16
  Filled 2020-12-11: qty 30, 10d supply, fill #0

## 2020-12-11 MED ORDER — HYDROCORTISONE (PERIANAL) 2.5 % EX CREA
1.0000 "application " | TOPICAL_CREAM | Freq: Two times a day (BID) | CUTANEOUS | 0 refills | Status: DC
  Filled 2020-12-11: qty 30, 10d supply, fill #0

## 2020-12-11 MED ORDER — ATORVASTATIN CALCIUM 40 MG PO TABS
ORAL_TABLET | Freq: Every day | ORAL | 3 refills | Status: DC
Start: 2020-12-11 — End: 2022-02-11
  Filled 2020-12-11: qty 90, fill #0
  Filled 2021-01-06 – 2021-04-13 (×2): qty 90, 90d supply, fill #0
  Filled 2021-07-03: qty 90, 90d supply, fill #1
  Filled 2021-10-21: qty 90, 90d supply, fill #2

## 2020-12-11 MED ORDER — AMLODIPINE BESYLATE 10 MG PO TABS
ORAL_TABLET | Freq: Every day | ORAL | 3 refills | Status: DC
  Filled 2020-12-11: qty 90, fill #0
  Filled 2020-12-29 – 2021-04-01 (×2): qty 90, 90d supply, fill #0
  Filled 2021-04-01 – 2021-07-03 (×2): qty 90, 90d supply, fill #1

## 2020-12-11 MED ORDER — BUDESONIDE-FORMOTEROL FUMARATE 80-4.5 MCG/ACT IN AERO
2.0000 | INHALATION_SPRAY | Freq: Two times a day (BID) | RESPIRATORY_TRACT | 11 refills | Status: DC
  Filled 2020-12-11: qty 10.2, 30d supply, fill #0

## 2020-12-11 MED ORDER — ALBUTEROL SULFATE HFA 108 (90 BASE) MCG/ACT IN AERS
INHALATION_SPRAY | RESPIRATORY_TRACT | 11 refills | Status: DC
Start: 2020-12-11 — End: 2021-07-31
  Filled 2020-12-11: qty 8.5, 17d supply, fill #0

## 2020-12-11 NOTE — Progress Notes (Signed)
[name], [place]  [address] Phone:  [phone]   Fax:  [phone]   Established Patient Office Visit  Subjective:  Patient ID: [name] [name], male    DOB: 1955/10/19  Age: 65 y.o. MRN: [id number]  CC:  Chief Complaint  Patient presents with   Follow-up    Pt is here today for his follow up visit. Pt states that he has been dealing with kidney stones x 1 year.  Pt states that he thinks he may have a hemorrhoid but not sure. Pt states that he has been having constipation x 2 months or more.    [name] [name] [name] presents for follow up. 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   He reports that he is not feeling well. He is having constipation  for 2 months which is causing a "ball" to comes out with a bowel movement.  He is also concern because he has a history of kidney stones. He denies any blood in urine or stool. He denies headache, dizziness, visual changes, shortness of breath, dyspnea on exertion, chest pain, nausea, vomiting or any edema.    Past Medical History:  Diagnosis Date   Arthritis    Chronic right ear pain 07/2019   Coronary artery disease    Depression 06/12/11   Buproprion per Dr. Lamonte Sakai   Drainage from ear, right 07/2019   Dyspnea    Full dentures    Fitting Per Dr. 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 of Cisplatin and Dhydrtion   Skin rash 01/2019   Smoking 06/12/11   1/2 pack/day   Status post chemotherapy 10/30/10 - 11/09/10    2 doses of Q 3 week Cisplatin   Status post radiation therapy 10/11/10 - 12/24/10   Bilateral Neck and Mucosa axis /  70 gray in 35 fractions   Xerostomia     Past Surgical History:  Procedure Laterality Date   APPENDECTOMY     BIOPSY TONGUE     TonsilandBaseoftongue   HERNIA REPAIR     umbilical with mesh   IR NEPHROSTOMY EXCHANGE LEFT  01/10/2017   IR NEPHROSTOMY PLACEMENT LEFT  11/15/2016   LEFT HEART CATH AND CORONARY ANGIOGRAPHY N/A 01/18/2017   Procedure: LEFT HEART CATH AND CORONARY ANGIOGRAPHY;  Surgeon: [name], MD;  Location: [facility];  Service: Cardiovascular;  Laterality: N/A;   NEPHROLITHOTOMY Left 02/11/2017   Procedure: NEPHROLITHOTOMY PERCUTANEOUS;  Surgeon: [name], MD;  Location: [facility];  Service: Urology;  Laterality: Left;   PEG TUBE REMOVAL  04/13/11   TONSILLECTOMY     right side    Family History  Problem Relation Age of Onset   Cancer Father 69       colon   Cancer Sister 78       leukemia    Social History   Socioeconomic  History   Marital status: Single    Spouse name: Not on file   Number of children: Not on file   Years of education: Not on file   Highest education level: Not on file  Occupational History   Not on file  Tobacco Use   Smoking status: Every Day    Packs/day: 0.50    Years: 25.00    Pack years: 12.50    Types: Cigarettes   Smokeless tobacco: Never   Tobacco comments:    Smoking 4-5 cigarettes daily on average  Vaping Use   Vaping Use: Never used  Substance and Sexual Activity   Alcohol use: No    Alcohol/week: 0.0 standard drinks   Drug use: No   Sexual activity: Not Currently  Other Topics Concern   Not on file  Social History Narrative   Not on file   Social Determinants of Health   Financial Resource Strain: Low  Risk    Difficulty of Paying Living Expenses: Not hard at all  Food Insecurity: No Food Insecurity   Worried About Charity fundraiser in the Last Year: Never true   Ran Out of Food in the Last Year: Never true  Transportation Needs: No Transportation Needs   Lack of Transportation (Medical): No   Lack of Transportation (Non-Medical): No  Physical Activity: Inactive   Days of Exercise per Week: 0 days   Minutes of Exercise per Session: 0 min  Stress: Stress Concern Present   Feeling of Stress : To some extent  Social Connections: Socially Isolated   Frequency of Communication with Friends and Family: More than three times a week   Frequency of Social Gatherings with Friends and Family: Twice a week   Attends Religious Services: Never   Marine scientist or Organizations: No   Attends Music therapist: Not on file   Marital Status: Widowed  Human resources officer Violence: Not At Risk   Fear of Current or Ex-Partner: No   Emotionally Abused: No   Physically Abused: No   Sexually Abused: No    Outpatient Medications Prior to Visit  Medication Sig Dispense Refill   hydrALAZINE (APRESOLINE) 25 MG tablet TAKE 2 TABLETS (50 MG TOTAL) BY MOUTH EVERY 8 (EIGHT) HOURS. 180 tablet 3   levothyroxine (SYNTHROID) 112 MCG tablet Take 1 tablet (112 mcg total) by mouth daily. 90 tablet 3   mirtazapine (REMERON SOL-TAB) 15 MG disintegrating tablet Take 1 tablet (15 mg total) by mouth at bedtime. 90 tablet 3   tamsulosin (FLOMAX) 0.4 MG CAPS capsule Take 1 capsule (0.4 mg total) by mouth daily. 90 capsule 3   Vitamin D, Ergocalciferol, (DRISDOL) 1.25 MG (50000 UNIT) CAPS capsule TAKE 1 CAPSULE (50,000 UNITS TOTAL) BY MOUTH EVERY 7 (SEVEN) DAYS. 5 capsule 6   albuterol (VENTOLIN HFA) 108 (90 Base) MCG/ACT inhaler INHALE 2 PUFFS INTO THE LUNGS EVERY 4 (FOUR) HOURS AS NEEDED FOR WHEEZING OR SHORTNESS OF BREATH (COUGH, SHORTNESS OF BREATH OR WHEEZING.). 18 g 11   amLODipine (NORVASC) 10 MG  tablet TAKE 1 TABLET (10 MG TOTAL) BY MOUTH DAILY. 90 tablet 3   atorvastatin (LIPITOR) 40 MG tablet TAKE 1 TABLET (40 MG TOTAL) BY MOUTH DAILY. 90 tablet 3   budesonide-formoterol (SYMBICORT) 80-4.5 MCG/ACT inhaler INHALE 2 PUFFS INTO THE LUNGS 2 (TWO) TIMES DAILY. 10.2 g 11   hydrocortisone cream 1 % Apply 1 application topically 2 (two) times daily. 30 g 6   metoprolol tartrate (LOPRESSOR) 25 MG tablet  TAKE 1 TABLET (25 MG TOTAL) BY MOUTH 2 (TWO) TIMES DAILY. 180 tablet 3   HYDROcodone-acetaminophen (NORCO/VICODIN) 5-325 MG tablet Take 1 tablet by mouth every 4 (four) hours as needed. (Patient not taking: Reported on 12/11/2020) 6 tablet 0   No facility-administered medications prior to visit.    Allergies  Allergen Reactions   Codeine Nausea Only    ROS Review of Systems    Objective:    Physical Exam Constitutional:      General: He is not in acute distress.    Appearance: He is not ill-appearing, toxic-appearing or diaphoretic.  HENT:     Head: Normocephalic and atraumatic.  Cardiovascular:     Rate and Rhythm: Normal rate and regular rhythm.     Pulses: Normal pulses.     Heart sounds: Normal heart sounds.  Pulmonary:     Effort: Pulmonary effort is normal.     Comments: Diminished expiratory breath sounds bases Abdominal:     Palpations: Abdomen is soft.  Genitourinary:    Rectum: Normal.  Musculoskeletal:        General: Normal range of motion.     Cervical back: Normal range of motion.  Skin:    General: Skin is warm and dry.     Capillary Refill: Capillary refill takes less than 2 seconds.  Neurological:     General: No focal deficit present.     Mental Status: He is alert and oriented to person, place, and time.  Psychiatric:        Mood and Affect: Mood normal.        Behavior: Behavior normal.        Thought Content: Thought content normal.        Judgment: Judgment normal.    BP 93/68   Pulse (!) 53   Temp (!) 97.4 F (36.3 C)   Ht 5' 9"  (1.753  m)   Wt 168 lb (76.2 kg)   SpO2 96%   BMI 24.81 kg/m  Wt Readings from Last 3 Encounters:  12/11/20 168 lb (76.2 kg)  11/06/20 167 lb 12.8 oz (76.1 kg)  08/21/20 164 lb 0.8 oz (74.4 kg)     Health Maintenance Due  Topic Date Due   INFLUENZA VACCINE  09/29/2020    There are no preventive care reminders to display for this patient.  Lab Results  Component Value Date   TSH 0.134 (L) 08/21/2020   Lab Results  Component Value Date   WBC 9.7 07/12/2020   HGB 12.3 (L) 07/12/2020   HCT 40.1 07/12/2020   MCV 80.5 07/12/2020   PLT 187 07/12/2020   Lab Results  Component Value Date   NA 140 07/12/2020   K 3.9 07/12/2020   CO2 20 (L) 07/12/2020   GLUCOSE 123 (H) 07/12/2020   BUN 19 07/12/2020   CREATININE 1.61 (H) 07/12/2020   BILITOT 0.5 07/12/2020   ALKPHOS 89 07/12/2020   AST 16 07/12/2020   ALT 22 07/12/2020   PROT 6.7 07/12/2020   ALBUMIN 3.6 07/12/2020   CALCIUM 9.0 07/12/2020   ANIONGAP 7 07/12/2020   EGFR 55 (L) 05/23/2020   Lab Results  Component Value Date   CHOL 118 05/23/2020   Lab Results  Component Value Date   HDL 29 (L) 05/23/2020   Lab Results  Component Value Date   LDLCALC 68 05/23/2020   Lab Results  Component Value Date   TRIG 116 05/23/2020   Lab Results  Component Value Date   CHOLHDL  4.1 05/23/2020   Lab Results  Component Value Date   HGBA1C 5.6 05/23/2020      Assessment & Plan:   Problem List Items Addressed This Visit       Cardiovascular and Mediastinum   Essential hypertension Stable  Encouraged on going compliance with current medication regimen Encouraged home monitoring and recording BP <130/80 Eating a heart-healthy diet with less salt Encouraged regular physical activity     Relevant Medications   amLODipine (NORVASC) 10 MG tablet   atorvastatin (LIPITOR) 40 MG tablet   metoprolol tartrate (LOPRESSOR) 25 MG tablet   Other Relevant Orders   Comprehensive metabolic panel     Musculoskeletal and  Integument   Skin rash   Relevant Medications   hydrocortisone cream 1 %     Other   Shortness of breath   Relevant Medications   albuterol (VENTOLIN HFA) 108 (90 Base) MCG/ACT inhaler   budesonide-formoterol (SYMBICORT) 80-4.5 MCG/ACT inhaler   Other Visit Diagnoses     Colon abnormality    -  Primary   Constipation, unspecified constipation type     Persistent  Encouraged stool softeners one to two times per day based on what was discussed Encouraged Miralax QOD to daily based on need and dicussion Encouraged hydration with water at least 8-10 8 ounce glasses per day Eat foods that have a lot of fiber, such as: Fresh fruits and vegetables. Whole grains. Beans. Eat less of foods that are high in fat, low in fiber, or overly processed Encouraged regular daily exercise starting with walking 20 minutes per day    Relevant Orders   DG Abd 2 Views   CBC with Differential/Platelet   Chronic abdominal pain     Evaluation    Relevant Orders   DG Abd 2 Views   Amylase   Lipase   Internal hemorrhoid       Relevant Medications   amLODipine (NORVASC) 10 MG tablet   atorvastatin (LIPITOR) 40 MG tablet   metoprolol tartrate (LOPRESSOR) 25 MG tablet   Other Relevant Orders   CBC with Differential/Platelet       Meds ordered this encounter  Medications   albuterol (VENTOLIN HFA) 108 (90 Base) MCG/ACT inhaler    Sig: INHALE 2 PUFFS INTO THE LUNGS EVERY 4 (FOUR) HOURS AS NEEDED FOR WHEEZING OR SHORTNESS OF BREATH (COUGH, SHORTNESS OF BREATH OR WHEEZING.).    Dispense:  18 g    Refill:  11    Order Specific Question:   Supervising Provider    Answer:   Tresa Garter [[id number]]   amLODipine (NORVASC) 10 MG tablet    Sig: TAKE 1 TABLET (10 MG TOTAL) BY MOUTH DAILY.    Dispense:  90 tablet    Refill:  3    Order Specific Question:   Supervising Provider    Answer:   Tresa Garter [[id number]]   atorvastatin (LIPITOR) 40 MG tablet    Sig: TAKE 1 TABLET (40 MG TOTAL)  BY MOUTH DAILY.    Dispense:  90 tablet    Refill:  3    Order Specific Question:   Supervising Provider    Answer:   Tresa Garter [[id number]]   metoprolol tartrate (LOPRESSOR) 25 MG tablet    Sig: TAKE 1 TABLET (25 MG TOTAL) BY MOUTH 2 (TWO) TIMES DAILY.    Dispense:  180 tablet    Refill:  3    Order Specific Question:   Supervising Provider    Answer:  JEGEDE, OLUGBEMIGA E [[id number]]   hydrocortisone cream 1 %    Sig: Apply 1 application topically 2 (two) times daily.    Dispense:  30 g    Refill:  6    Order Specific Question:   Supervising Provider    Answer:   Tresa Garter [[id number]]   budesonide-formoterol (SYMBICORT) 80-4.5 MCG/ACT inhaler    Sig: INHALE 2 PUFFS INTO THE LUNGS 2 (TWO) TIMES DAILY.    Dispense:  10.2 g    Refill:  11    Order Specific Question:   Supervising Provider    Answer:   Tresa Garter [[id number]]   hydrocortisone (PROCTOSOL HC) 2.5 % rectal cream    Sig: Place 1 application rectally 2 (two) times daily.    Dispense:  30 g    Refill:  0    Order Specific Question:   Supervising Provider    Answer:   Tresa Garter [[id number]]    Follow-up: Return in about 6 months (around 06/11/2021).    Vevelyn Francois, NP\

## 2020-12-11 NOTE — Patient Instructions (Signed)

## 2020-12-12 ENCOUNTER — Other Ambulatory Visit: Payer: Self-pay

## 2020-12-12 LAB — COMPREHENSIVE METABOLIC PANEL
ALT: 18 IU/L (ref 0–44)
AST: 12 IU/L (ref 0–40)
Albumin/Globulin Ratio: 1.7 (ref 1.2–2.2)
Albumin: 4 g/dL (ref 3.8–4.8)
Alkaline Phosphatase: 117 IU/L (ref 44–121)
BUN/Creatinine Ratio: 8 — ABNORMAL LOW (ref 10–24)
BUN: 14 mg/dL (ref 8–27)
Bilirubin Total: 0.4 mg/dL (ref 0.0–1.2)
CO2: 19 mmol/L — ABNORMAL LOW (ref 20–29)
Calcium: 9.2 mg/dL (ref 8.6–10.2)
Chloride: 109 mmol/L — ABNORMAL HIGH (ref 96–106)
Creatinine, Ser: 1.74 mg/dL — ABNORMAL HIGH (ref 0.76–1.27)
Globulin, Total: 2.4 g/dL (ref 1.5–4.5)
Glucose: 92 mg/dL (ref 70–99)
Potassium: 4.5 mmol/L (ref 3.5–5.2)
Sodium: 144 mmol/L (ref 134–144)
Total Protein: 6.4 g/dL (ref 6.0–8.5)
eGFR: 43 mL/min/{1.73_m2} — ABNORMAL LOW (ref >59)

## 2020-12-12 LAB — CBC WITH DIFFERENTIAL/PLATELET
Basophils Absolute: 0.1 10*3/uL (ref 0.0–0.2)
Basos: 1 %
EOS (ABSOLUTE): 0.2 10*3/uL (ref 0.0–0.4)
Eos: 2 %
Hematocrit: 42.7 % (ref 37.5–51.0)
Hemoglobin: 13.2 g/dL (ref 13.0–17.7)
Immature Grans (Abs): 0.1 10*3/uL (ref 0.0–0.1)
Immature Granulocytes: 1 %
Lymphocytes Absolute: 1.3 10*3/uL (ref 0.7–3.1)
Lymphs: 11 %
MCH: 24 pg — ABNORMAL LOW (ref 26.6–33.0)
MCHC: 30.9 g/dL — ABNORMAL LOW (ref 31.5–35.7)
MCV: 78 fL — ABNORMAL LOW (ref 79–97)
Monocytes Absolute: 0.8 10*3/uL (ref 0.1–0.9)
Monocytes: 7 %
Neutrophils Absolute: 9.2 10*3/uL — ABNORMAL HIGH (ref 1.4–7.0)
Neutrophils: 78 %
Platelets: 228 10*3/uL (ref 150–450)
RBC: 5.5 x10E6/uL (ref 4.14–5.80)
RDW: 15.4 % (ref 11.6–15.4)
WBC: 11.6 10*3/uL — ABNORMAL HIGH (ref 3.4–10.8)

## 2020-12-12 LAB — LIPASE: Lipase: 65 U/L (ref 13–78)

## 2020-12-12 LAB — AMYLASE: Amylase: 74 U/L (ref 31–110)

## 2020-12-18 MED ORDER — HYDRALAZINE HCL 25 MG PO TABS
ORAL_TABLET | ORAL | 3 refills | Status: DC
  Filled 2020-12-18: qty 180, fill #0
  Filled 2021-01-12: qty 180, 30d supply, fill #0
  Filled 2021-02-18: qty 180, 30d supply, fill #1
  Filled 2021-04-01: qty 180, 30d supply, fill #0
  Filled 2021-04-01: qty 180, 30d supply, fill #2
  Filled 2021-05-11: qty 180, 30d supply, fill #1

## 2020-12-18 MED ORDER — POLYETHYLENE GLYCOL 3350 17 GM/SCOOP PO POWD
17.0000 g | Freq: Every day | ORAL | 0 refills | Status: AC
  Filled 2020-12-18: qty 238, 14d supply, fill #0

## 2020-12-18 MED ORDER — AMOXICILLIN-POT CLAVULANATE 875-125 MG PO TABS
1.0000 | ORAL_TABLET | Freq: Two times a day (BID) | ORAL | 0 refills | Status: AC
  Filled 2020-12-18: qty 20, 10d supply, fill #0

## 2020-12-19 ENCOUNTER — Other Ambulatory Visit: Payer: Self-pay

## 2020-12-25 ENCOUNTER — Other Ambulatory Visit: Payer: Self-pay

## 2020-12-26 ENCOUNTER — Other Ambulatory Visit: Payer: Self-pay

## 2020-12-29 ENCOUNTER — Other Ambulatory Visit: Payer: Self-pay

## 2020-12-29 ENCOUNTER — Other Ambulatory Visit: Payer: Self-pay | Admitting: Family Medicine

## 2020-12-29 DIAGNOSIS — I10 Essential (primary) hypertension: Secondary | ICD-10-CM

## 2020-12-30 ENCOUNTER — Other Ambulatory Visit: Payer: Self-pay

## 2020-12-31 ENCOUNTER — Other Ambulatory Visit: Payer: Self-pay

## 2020-12-31 MED ORDER — ISOSORBIDE MONONITRATE ER 30 MG PO TB24
ORAL_TABLET | Freq: Three times a day (TID) | ORAL | 3 refills | Status: DC | PRN
  Filled 2020-12-31 – 2021-04-13 (×2): qty 90, 90d supply, fill #0
  Filled 2021-07-03: qty 90, 90d supply, fill #1
  Filled 2021-10-21: qty 90, 90d supply, fill #2

## 2021-01-06 ENCOUNTER — Other Ambulatory Visit: Payer: Self-pay

## 2021-01-12 ENCOUNTER — Other Ambulatory Visit: Payer: Self-pay

## 2021-02-10 ENCOUNTER — Other Ambulatory Visit: Payer: Self-pay

## 2021-02-13 ENCOUNTER — Other Ambulatory Visit: Payer: Self-pay

## 2021-02-18 ENCOUNTER — Other Ambulatory Visit: Payer: Self-pay

## 2021-02-24 ENCOUNTER — Other Ambulatory Visit: Payer: Self-pay

## 2021-02-26 ENCOUNTER — Other Ambulatory Visit: Payer: Self-pay | Admitting: *Deleted

## 2021-02-26 DIAGNOSIS — Z87891 Personal history of nicotine dependence: Secondary | ICD-10-CM

## 2021-02-26 DIAGNOSIS — F17210 Nicotine dependence, cigarettes, uncomplicated: Secondary | ICD-10-CM

## 2021-03-02 ENCOUNTER — Emergency Department (HOSPITAL_COMMUNITY): Payer: Medicare Other | Admitting: Certified Registered Nurse Anesthetist

## 2021-03-02 ENCOUNTER — Encounter (HOSPITAL_COMMUNITY): Payer: Self-pay

## 2021-03-02 ENCOUNTER — Other Ambulatory Visit: Payer: Self-pay

## 2021-03-02 ENCOUNTER — Inpatient Hospital Stay (HOSPITAL_COMMUNITY)
Admission: EM | Admit: 2021-03-02 | Discharge: 2021-03-06 | DRG: 253 | Disposition: A | Discharge status: Home / Self Care | Payer: Medicare Other | Attending: Vascular Surgery | Admitting: Vascular Surgery

## 2021-03-02 ENCOUNTER — Emergency Department (HOSPITAL_COMMUNITY): Payer: Medicare Other

## 2021-03-02 ENCOUNTER — Encounter (HOSPITAL_COMMUNITY)
Admission: EM | Disposition: A | Discharge status: Home / Self Care | Payer: Self-pay | Source: Home / Self Care | Attending: Vascular Surgery

## 2021-03-02 DIAGNOSIS — N179 Acute kidney failure, unspecified: Secondary | ICD-10-CM

## 2021-03-02 DIAGNOSIS — N1832 Chronic kidney disease, stage 3b: Secondary | ICD-10-CM | POA: Diagnosis present

## 2021-03-02 DIAGNOSIS — Z79899 Other long term (current) drug therapy: Secondary | ICD-10-CM

## 2021-03-02 DIAGNOSIS — I998 Other disorder of circulatory system: Secondary | ICD-10-CM

## 2021-03-02 DIAGNOSIS — I7090 Unspecified atherosclerosis: Secondary | ICD-10-CM | POA: Diagnosis present

## 2021-03-02 DIAGNOSIS — Z885 Allergy status to narcotic agent status: Secondary | ICD-10-CM

## 2021-03-02 DIAGNOSIS — Z8589 Personal history of malignant neoplasm of other organs and systems: Secondary | ICD-10-CM

## 2021-03-02 DIAGNOSIS — Z20822 Contact with and (suspected) exposure to covid-19: Secondary | ICD-10-CM | POA: Diagnosis present

## 2021-03-02 DIAGNOSIS — Z85818 Personal history of malignant neoplasm of other sites of lip, oral cavity, and pharynx: Secondary | ICD-10-CM | POA: Diagnosis not present

## 2021-03-02 DIAGNOSIS — M1990 Unspecified osteoarthritis, unspecified site: Secondary | ICD-10-CM | POA: Diagnosis present

## 2021-03-02 DIAGNOSIS — Z9889 Other specified postprocedural states: Secondary | ICD-10-CM

## 2021-03-02 DIAGNOSIS — Z7951 Long term (current) use of inhaled steroids: Secondary | ICD-10-CM | POA: Diagnosis not present

## 2021-03-02 DIAGNOSIS — I129 Hypertensive chronic kidney disease with stage 1 through stage 4 chronic kidney disease, or unspecified chronic kidney disease: Secondary | ICD-10-CM | POA: Diagnosis present

## 2021-03-02 DIAGNOSIS — N189 Chronic kidney disease, unspecified: Secondary | ICD-10-CM

## 2021-03-02 DIAGNOSIS — F17210 Nicotine dependence, cigarettes, uncomplicated: Secondary | ICD-10-CM

## 2021-03-02 DIAGNOSIS — I10 Essential (primary) hypertension: Secondary | ICD-10-CM

## 2021-03-02 DIAGNOSIS — I742 Embolism and thrombosis of arteries of the upper extremities: Secondary | ICD-10-CM | POA: Diagnosis not present

## 2021-03-02 DIAGNOSIS — F17200 Nicotine dependence, unspecified, uncomplicated: Secondary | ICD-10-CM | POA: Diagnosis present

## 2021-03-02 DIAGNOSIS — Z9221 Personal history of antineoplastic chemotherapy: Secondary | ICD-10-CM

## 2021-03-02 DIAGNOSIS — R918 Other nonspecific abnormal finding of lung field: Secondary | ICD-10-CM | POA: Diagnosis present

## 2021-03-02 DIAGNOSIS — E039 Hypothyroidism, unspecified: Secondary | ICD-10-CM | POA: Diagnosis present

## 2021-03-02 DIAGNOSIS — E7800 Pure hypercholesterolemia, unspecified: Secondary | ICD-10-CM | POA: Diagnosis present

## 2021-03-02 DIAGNOSIS — Z923 Personal history of irradiation: Secondary | ICD-10-CM

## 2021-03-02 DIAGNOSIS — Z79890 Hormone replacement therapy (postmenopausal): Secondary | ICD-10-CM | POA: Diagnosis not present

## 2021-03-02 DIAGNOSIS — I771 Stricture of artery: Secondary | ICD-10-CM | POA: Diagnosis not present

## 2021-03-02 DIAGNOSIS — I748 Embolism and thrombosis of other arteries: Secondary | ICD-10-CM | POA: Diagnosis present

## 2021-03-02 DIAGNOSIS — I2510 Atherosclerotic heart disease of native coronary artery without angina pectoris: Secondary | ICD-10-CM | POA: Diagnosis not present

## 2021-03-02 HISTORY — PX: THROMBECTOMY BRACHIAL ARTERY: SHX6649

## 2021-03-02 LAB — COMPREHENSIVE METABOLIC PANEL
ALT: 22 U/L (ref 0–44)
AST: 18 U/L (ref 15–41)
Albumin: 3.8 g/dL (ref 3.5–5.0)
Alkaline Phosphatase: 101 U/L (ref 38–126)
Anion gap: 11 (ref 5–15)
BUN: 23 mg/dL (ref 8–23)
CO2: 16 mmol/L — ABNORMAL LOW (ref 22–32)
Calcium: 9.1 mg/dL (ref 8.9–10.3)
Chloride: 112 mmol/L — ABNORMAL HIGH (ref 98–111)
Creatinine, Ser: 1.73 mg/dL — ABNORMAL HIGH (ref 0.61–1.24)
GFR, Estimated: 43 mL/min — ABNORMAL LOW (ref >60)
Glucose, Bld: 127 mg/dL — ABNORMAL HIGH (ref 70–99)
Potassium: 3.9 mmol/L (ref 3.5–5.1)
Sodium: 139 mmol/L (ref 135–145)
Total Bilirubin: 0.7 mg/dL (ref 0.3–1.2)
Total Protein: 7.2 g/dL (ref 6.5–8.1)

## 2021-03-02 LAB — I-STAT CHEM 8, ED
BUN: 20 mg/dL (ref 8–23)
Calcium, Ion: 1.17 mmol/L (ref 1.15–1.40)
Chloride: 112 mmol/L — ABNORMAL HIGH (ref 98–111)
Creatinine, Ser: 1.7 mg/dL — ABNORMAL HIGH (ref 0.61–1.24)
Glucose, Bld: 122 mg/dL — ABNORMAL HIGH (ref 70–99)
HCT: 40 % (ref 39.0–52.0)
Hemoglobin: 13.6 g/dL (ref 13.0–17.0)
Potassium: 4 mmol/L (ref 3.5–5.1)
Sodium: 141 mmol/L (ref 135–145)
TCO2: 17 mmol/L — ABNORMAL LOW (ref 22–32)

## 2021-03-02 LAB — CBC WITH DIFFERENTIAL/PLATELET
Abs Immature Granulocytes: 0.09 10*3/uL — ABNORMAL HIGH (ref 0.00–0.07)
Basophils Absolute: 0.1 10*3/uL (ref 0.0–0.1)
Basophils Relative: 0 %
Eosinophils Absolute: 0 10*3/uL (ref 0.0–0.5)
Eosinophils Relative: 0 %
HCT: 41.5 % (ref 39.0–52.0)
Hemoglobin: 13.1 g/dL (ref 13.0–17.0)
Immature Granulocytes: 1 %
Lymphocytes Relative: 10 %
Lymphs Abs: 1.7 10*3/uL (ref 0.7–4.0)
MCH: 24.5 pg — ABNORMAL LOW (ref 26.0–34.0)
MCHC: 31.6 g/dL (ref 30.0–36.0)
MCV: 77.7 fL — ABNORMAL LOW (ref 80.0–100.0)
Monocytes Absolute: 0.7 10*3/uL (ref 0.1–1.0)
Monocytes Relative: 4 %
Neutro Abs: 14.6 10*3/uL — ABNORMAL HIGH (ref 1.7–7.7)
Neutrophils Relative %: 85 %
Platelets: 229 10*3/uL (ref 150–400)
RBC: 5.34 MIL/uL (ref 4.22–5.81)
RDW: 18 % — ABNORMAL HIGH (ref 11.5–15.5)
WBC: 17.1 10*3/uL — ABNORMAL HIGH (ref 4.0–10.5)
nRBC: 0 % (ref 0.0–0.2)

## 2021-03-02 LAB — RESP PANEL BY RT-PCR (FLU A&B, COVID) ARPGX2
Influenza A by PCR: NEGATIVE
Influenza B by PCR: NEGATIVE
SARS Coronavirus 2 by RT PCR: NEGATIVE

## 2021-03-02 LAB — TROPONIN I (HIGH SENSITIVITY): Troponin I (High Sensitivity): 7 ng/L (ref <18)

## 2021-03-02 LAB — HEPARIN LEVEL (UNFRACTIONATED): Heparin Unfractionated: >1.1 IU/mL — ABNORMAL HIGH (ref 0.30–0.70)

## 2021-03-02 SURGERY — THROMBECTOMY, ARTERY, BRACHIAL
Anesthesia: General | Site: Arm Upper | Laterality: Right

## 2021-03-02 MED ORDER — ONDANSETRON HCL 4 MG/2ML IJ SOLN
INTRAMUSCULAR | Status: DC | PRN
  Administered 2021-03-02: 4 mg via INTRAVENOUS

## 2021-03-02 MED ORDER — DEXAMETHASONE SODIUM PHOSPHATE 10 MG/ML IJ SOLN
INTRAMUSCULAR | Status: AC
  Filled 2021-03-02: qty 1

## 2021-03-02 MED ORDER — LIDOCAINE HCL (CARDIAC) PF 100 MG/5ML IV SOSY
PREFILLED_SYRINGE | INTRAVENOUS | Status: DC | PRN
  Administered 2021-03-02: 40 mg via INTRAVENOUS

## 2021-03-02 MED ORDER — CEFAZOLIN SODIUM-DEXTROSE 2-3 GM-%(50ML) IV SOLR
INTRAVENOUS | Status: DC | PRN
  Administered 2021-03-02: 2 g via INTRAVENOUS

## 2021-03-02 MED ORDER — EPHEDRINE SULFATE 50 MG/ML IJ SOLN
INTRAMUSCULAR | Status: DC | PRN
  Administered 2021-03-02: 10 mg via INTRAVENOUS
  Administered 2021-03-02: 5 mg via INTRAVENOUS
  Administered 2021-03-02 (×2): 10 mg via INTRAVENOUS

## 2021-03-02 MED ORDER — ONDANSETRON HCL 4 MG/2ML IJ SOLN
4.0000 mg | Freq: Once | INTRAMUSCULAR | Status: AC
  Administered 2021-03-02: 4 mg via INTRAVENOUS
  Filled 2021-03-02: qty 2

## 2021-03-02 MED ORDER — FENTANYL CITRATE (PF) 100 MCG/2ML IJ SOLN
INTRAMUSCULAR | Status: DC | PRN
Start: 2021-03-02 — End: 2021-03-02
  Administered 2021-03-02: 150 ug via INTRAVENOUS

## 2021-03-02 MED ORDER — ROCURONIUM BROMIDE 100 MG/10ML IV SOLN
INTRAVENOUS | Status: DC | PRN
  Administered 2021-03-02: 50 mg via INTRAVENOUS

## 2021-03-02 MED ORDER — FENTANYL CITRATE (PF) 100 MCG/2ML IJ SOLN
INTRAMUSCULAR | Status: AC
  Filled 2021-03-02: qty 2

## 2021-03-02 MED ORDER — MIDAZOLAM HCL 5 MG/5ML IJ SOLN
INTRAMUSCULAR | Status: DC | PRN
  Administered 2021-03-02: 2 mg via INTRAVENOUS

## 2021-03-02 MED ORDER — ONDANSETRON HCL 4 MG/2ML IJ SOLN
INTRAMUSCULAR | Status: AC
  Filled 2021-03-02: qty 2

## 2021-03-02 MED ORDER — PROPOFOL 10 MG/ML IV BOLUS
INTRAVENOUS | Status: AC
  Filled 2021-03-02: qty 20

## 2021-03-02 MED ORDER — OXYCODONE HCL 5 MG/5ML PO SOLN: 5.0000 mg | Freq: Once | ORAL | Status: DC | PRN

## 2021-03-02 MED ORDER — SUGAMMADEX SODIUM 200 MG/2ML IV SOLN
INTRAVENOUS | Status: DC | PRN
Start: 2021-03-02 — End: 2021-03-02
  Administered 2021-03-02: 200 mg via INTRAVENOUS

## 2021-03-02 MED ORDER — PHENYLEPHRINE HCL-NACL 20-0.9 MG/250ML-% IV SOLN
INTRAVENOUS | Status: DC | PRN
  Administered 2021-03-02: 50 ug/min via INTRAVENOUS

## 2021-03-02 MED ORDER — OXYCODONE HCL 5 MG PO TABS: 5.0000 mg | ORAL_TABLET | Freq: Once | ORAL | Status: DC | PRN

## 2021-03-02 MED ORDER — PROPOFOL 10 MG/ML IV BOLUS
INTRAVENOUS | Status: DC | PRN
Start: 2021-03-02 — End: 2021-03-02
  Administered 2021-03-02: 100 mg via INTRAVENOUS
  Administered 2021-03-02: 30 mg via INTRAVENOUS

## 2021-03-02 MED ORDER — DEXAMETHASONE SODIUM PHOSPHATE 4 MG/ML IJ SOLN
INTRAMUSCULAR | Status: DC | PRN
  Administered 2021-03-02: 10 mg via INTRAVENOUS

## 2021-03-02 MED ORDER — IOHEXOL 350 MG/ML SOLN
80.0000 mL | Freq: Once | INTRAVENOUS | Status: AC | PRN
  Administered 2021-03-02: 80 mL via INTRAVENOUS

## 2021-03-02 MED ORDER — HEPARIN SODIUM (PORCINE) 1000 UNIT/ML IJ SOLN
INTRAMUSCULAR | Status: DC | PRN
Start: 2021-03-02 — End: 2021-03-02
  Administered 2021-03-02: 3000 [IU] via INTRAVENOUS

## 2021-03-02 MED ORDER — HEPARIN 6000 UNIT IRRIGATION SOLUTION
Status: DC | PRN
  Administered 2021-03-02: 1

## 2021-03-02 MED ORDER — LACTATED RINGERS IV SOLN: INTRAVENOUS | Status: DC | PRN

## 2021-03-02 MED ORDER — MIDAZOLAM HCL 2 MG/2ML IJ SOLN
INTRAMUSCULAR | Status: AC
  Filled 2021-03-02: qty 2

## 2021-03-02 MED ORDER — HEPARIN BOLUS VIA INFUSION
4500.0000 [IU] | Freq: Once | INTRAVENOUS | Status: AC
  Administered 2021-03-02: 4500 [IU] via INTRAVENOUS
  Filled 2021-03-02: qty 4500

## 2021-03-02 MED ORDER — FENTANYL CITRATE (PF) 250 MCG/5ML IJ SOLN
INTRAMUSCULAR | Status: AC
  Filled 2021-03-02: qty 5

## 2021-03-02 MED ORDER — 0.9 % SODIUM CHLORIDE (POUR BTL) OPTIME
TOPICAL | Status: DC | PRN
  Administered 2021-03-02: 1000 mL

## 2021-03-02 MED ORDER — HEPARIN (PORCINE) 25000 UT/250ML-% IV SOLN
1250.0000 [IU]/h | INTRAVENOUS | Status: DC
  Administered 2021-03-02 (×2): 1300 [IU]/h via INTRAVENOUS
  Administered 2021-03-03: 1150 [IU]/h via INTRAVENOUS
  Administered 2021-03-04 (×2): 1250 [IU]/h via INTRAVENOUS
  Filled 2021-03-02 (×4): qty 250

## 2021-03-02 MED ORDER — ONDANSETRON HCL 4 MG/2ML IJ SOLN: 4.0000 mg | Freq: Four times a day (QID) | INTRAMUSCULAR | Status: DC | PRN

## 2021-03-02 MED ORDER — MORPHINE SULFATE (PF) 4 MG/ML IV SOLN
4.0000 mg | Freq: Once | INTRAVENOUS | Status: AC
  Administered 2021-03-02: 4 mg via INTRAVENOUS
  Filled 2021-03-02: qty 1

## 2021-03-02 MED ORDER — FENTANYL CITRATE (PF) 100 MCG/2ML IJ SOLN
25.0000 ug | INTRAMUSCULAR | Status: DC | PRN
  Administered 2021-03-02 (×2): 50 ug via INTRAVENOUS

## 2021-03-02 SURGICAL SUPPLY — 37 items
ADH SKN CLS APL DERMABOND .7 (GAUZE/BANDAGES/DRESSINGS) ×1
ARMBAND PINK RESTRICT EXTREMIT (MISCELLANEOUS) ×2 IMPLANT
CANISTER SUCT 1200ML W/VALVE (MISCELLANEOUS) ×3 IMPLANT
CATH EMB 2FR 60CM (CATHETERS) ×1 IMPLANT
CATH EMB 3FR 40CM (CATHETERS) ×1 IMPLANT
CATH EMB 3FR 80CM (CATHETERS) ×1 IMPLANT
CATH EMB 4FR 80CM (CATHETERS) IMPLANT
CATH EMB 5FR 80CM (CATHETERS) IMPLANT
CLIP TI MEDIUM 6 (CLIP) ×3 IMPLANT
CLIP TI WIDE RED SMALL 6 (CLIP) ×3 IMPLANT
CNTNR URN SCR LID CUP LEK RST (MISCELLANEOUS) IMPLANT
CONT SPEC 4OZ STRL OR WHT (MISCELLANEOUS) ×2
COVER PROBE W GEL 5X96 (DRAPES) ×3 IMPLANT
DERMABOND ADVANCED (GAUZE/BANDAGES/DRESSINGS) ×1
DERMABOND ADVANCED .7 DNX12 (GAUZE/BANDAGES/DRESSINGS) ×2 IMPLANT
ELECT REM PT RETURN 9FT ADLT (ELECTROSURGICAL) ×2
ELECTRODE REM PT RTRN 9FT ADLT (ELECTROSURGICAL) ×2 IMPLANT
GLOVE SURG ENC MOIS LTX SZ7.5 (GLOVE) ×3 IMPLANT
GOWN STRL REUS W/ TWL LRG LVL3 (GOWN DISPOSABLE) ×4 IMPLANT
GOWN STRL REUS W/ TWL XL LVL3 (GOWN DISPOSABLE) ×2 IMPLANT
GOWN STRL REUS W/TWL LRG LVL3 (GOWN DISPOSABLE) ×4
GOWN STRL REUS W/TWL XL LVL3 (GOWN DISPOSABLE) ×2
KIT BASIN OR (CUSTOM PROCEDURE TRAY) ×3 IMPLANT
KIT TURNOVER KIT B (KITS) ×3 IMPLANT
LOOP VESSEL MINI RED (MISCELLANEOUS) ×2 IMPLANT
NS IRRIG 1000ML POUR BTL (IV SOLUTION) ×3 IMPLANT
PACK CV ACCESS (CUSTOM PROCEDURE TRAY) ×3 IMPLANT
PAD ARMBOARD 7.5X6 YLW CONV (MISCELLANEOUS) ×6 IMPLANT
PENCIL BUTTON HOLSTER BLD 10FT (ELECTRODE) ×1 IMPLANT
SUT MNCRL AB 4-0 PS2 18 (SUTURE) ×3 IMPLANT
SUT PROLENE 6 0 BV (SUTURE) ×11 IMPLANT
SUT VIC AB 3-0 SH 27 (SUTURE) ×2
SUT VIC AB 3-0 SH 27X BRD (SUTURE) ×2 IMPLANT
SYR 3ML LL SCALE MARK (SYRINGE) ×1 IMPLANT
TOWEL GREEN STERILE FF (TOWEL DISPOSABLE) ×3 IMPLANT
UNDERPAD 30X36 HEAVY ABSORB (UNDERPADS AND DIAPERS) ×3 IMPLANT
WATER STERILE IRR 1000ML POUR (IV SOLUTION) ×3 IMPLANT

## 2021-03-02 NOTE — ED Triage Notes (Signed)
Pt reports having pain to his right hand earlier today and then taking a nap. Upon waking he noticed his hand is pale and blue. Unable to feel right radial and brachial pulses. Upon getting pt in bed he became diaphoretic and pale.

## 2021-03-02 NOTE — Anesthesia Preprocedure Evaluation (Signed)
Anesthesia Evaluation  Patient identified by MRN, date of birth, ID band Patient awake    Reviewed: Allergy & Precautions, H&P , NPO status , Patient's Chart, lab work & pertinent test results  Airway Mallampati: II   Neck ROM: full    Dental   Pulmonary shortness of breath, Current Smoker,  H/o throat CA s/p XRT and chemo.   breath sounds clear to auscultation       Cardiovascular hypertension, + CAD  + Valvular Problems/Murmurs  Rhythm:regular Rate:Normal  Multivessel CAD under medical management.  Last visit with cardiologist 10/2020 there was no chest pain.    Echo: preserved EF, mild MR  Cath: 85% lesion RCA   Neuro/Psych PSYCHIATRIC DISORDERS Depression    GI/Hepatic   Endo/Other  Hypothyroidism   Renal/GU Renal InsufficiencyRenal disease     Musculoskeletal  (+) Arthritis ,   Abdominal   Peds  Hematology   Anesthesia Other Findings   Reproductive/Obstetrics                             Anesthesia Physical Anesthesia Plan  ASA: 3 and emergent  Anesthesia Plan: General   Post-op Pain Management:    Induction: Intravenous  PONV Risk Score and Plan: 1 and Ondansetron, Dexamethasone, Midazolam and Treatment may vary due to age or medical condition  Airway Management Planned: Oral ETT  Additional Equipment: Arterial line  Intra-op Plan:   Post-operative Plan: Extubation in OR  Informed Consent: I have reviewed the patients History and Physical, chart, labs and discussed the procedure including the risks, benefits and alternatives for the proposed anesthesia with the patient or authorized representative who has indicated his/her understanding and acceptance.     Dental advisory given  Plan Discussed with: CRNA, Anesthesiologist and Surgeon  Anesthesia Plan Comments:         Anesthesia Quick Evaluation

## 2021-03-02 NOTE — H&P (Addendum)
H&P    Reason for Consult: Ischemic right upper extremity Referring Physician: ED MRN #:  [id number]  History of Present Illness: This is a 66 y.o. male with history of coronary artery disease, tobacco abuse, head and neck cancer status post chemoradiation, hypertension that presents as a transfer from [facility] ED with concern for ischemic right upper extremity.  Patient states that he had acute onset of severe pain and numbness in the right hand and forearm started around 2 PM today.  He states prior to this event he had no previous issues with the right upper extremity.  He does smoke but denies any other recent illegal drug use.  He states no previous thromboembolic events.  Severe pain in the right hand with numbness.  Able to move fingers.  Past Medical History:  Diagnosis Date   Arthritis    Chronic right ear pain 07/2019   Coronary artery disease    Depression 06/12/11   Buproprion per Dr. Lamonte Sakai   Drainage from ear, right 07/2019   Dyspnea    Full dentures    Fitting Per Dr. 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 of Cisplatin and Dhydrtion   Skin rash 01/2019   Smoking 06/12/11   1/2 pack/day   Status post chemotherapy 10/30/10 - 11/09/10    2 doses of Q 3 week Cisplatin   Status post radiation therapy 10/11/10 - 12/24/10   Bilateral Neck and Mucosa axis /  70 gray in 35 fractions   Xerostomia     Past Surgical History:  Procedure Laterality Date   APPENDECTOMY     BIOPSY TONGUE     TonsilandBaseoftongue   HERNIA REPAIR     umbilical with mesh   IR NEPHROSTOMY EXCHANGE LEFT  01/10/2017   IR NEPHROSTOMY PLACEMENT LEFT  11/15/2016   LEFT HEART CATH AND CORONARY ANGIOGRAPHY N/A 01/18/2017   Procedure: LEFT HEART CATH AND CORONARY ANGIOGRAPHY;  Surgeon: [name], MD;  Location: [facility];  Service: Cardiovascular;  Laterality: N/A;   NEPHROLITHOTOMY Left 02/11/2017   Procedure: NEPHROLITHOTOMY PERCUTANEOUS;  Surgeon: [name], MD;  Location: [facility];  Service: Urology;  Laterality: Left;   PEG TUBE REMOVAL  04/13/11   TONSILLECTOMY     right side    Allergies  Allergen Reactions   Codeine Nausea Only    Prior to Admission medications   Medication Sig Start Date End Date Taking? Authorizing Provider  albuterol (VENTOLIN HFA) 108 (90 Base) MCG/ACT inhaler INHALE 2 PUFFS INTO THE LUNGS EVERY 4 (FOUR) HOURS AS NEEDED FOR WHEEZING OR SHORTNESS OF BREATH (COUGH, SHORTNESS OF BREATH OR WHEEZING.). 12/11/20 12/11/21  Vevelyn Francois, NP  amLODipine (NORVASC) 10 MG tablet TAKE 1 TABLET (10 MG TOTAL) BY MOUTH DAILY. 12/11/20 12/11/21  Vevelyn Francois, NP  atorvastatin (LIPITOR) 40 MG tablet TAKE 1 TABLET (40 MG TOTAL) BY MOUTH DAILY. 12/11/20 12/11/21  Vevelyn Francois, NP  budesonide-formoterol (SYMBICORT) 80-4.5 MCG/ACT inhaler INHALE 2 PUFFS INTO THE LUNGS 2 (TWO) TIMES DAILY. 12/11/20 12/11/21  Vevelyn Francois, NP  hydrALAZINE (APRESOLINE) 25 MG tablet TAKE 2 TABLETS (50 MG TOTAL) BY MOUTH EVERY 8 (EIGHT) HOURS. 12/18/20 12/18/21  Vevelyn Francois, NP  HYDROcodone-acetaminophen (NORCO/VICODIN) 5-325 MG tablet Take 1 tablet  by mouth every 4 (four) hours as needed. Patient not taking: Reported on 12/11/2020 07/12/20   Garald Balding, PA-C  hydrocortisone (PROCTOSOL HC) 2.5 % rectal cream Place 1 application rectally 2 (two) times daily. 12/11/20   Vevelyn Francois, NP  hydrocortisone cream 1 % Apply 1 application topically 2 (two) times daily. 12/11/20   Vevelyn Francois, NP  isosorbide mononitrate (IMDUR) 30 MG 24 hr tablet TAKE 1 TABLET (30 MG TOTAL) BY MOUTH DAILY. 12/31/20 12/31/21  Vevelyn Francois, NP  levothyroxine (SYNTHROID) 112 MCG tablet Take 1 tablet (112 mcg total) by mouth daily. 08/22/20 08/22/21  Vevelyn Francois, NP  metoprolol tartrate (LOPRESSOR) 25 MG  tablet TAKE 1 TABLET (25 MG TOTAL) BY MOUTH 2 (TWO) TIMES DAILY. 12/11/20 12/11/21  Vevelyn Francois, NP  mirtazapine (REMERON SOL-TAB) 15 MG disintegrating tablet Take 1 tablet (15 mg total) by mouth at bedtime. 07/10/20 07/10/21  Vevelyn Francois, NP  tamsulosin (FLOMAX) 0.4 MG CAPS capsule Take 1 capsule (0.4 mg total) by mouth daily. 08/21/20 08/21/21  Vevelyn Francois, NP  Vitamin D, Ergocalciferol, (DRISDOL) 1.25 MG (50000 UNIT) CAPS capsule TAKE 1 CAPSULE (50,000 UNITS TOTAL) BY MOUTH EVERY 7 (SEVEN) DAYS. 07/10/20 07/10/21  Vevelyn Francois, NP    Social History   Socioeconomic History   Marital status: Single    Spouse name: Not on file   Number of children: Not on file   Years of education: Not on file   Highest education level: Not on file  Occupational History   Not on file  Tobacco Use   Smoking status: Every Day    Packs/day: 0.50    Years: 25.00    Pack years: 12.50    Types: Cigarettes   Smokeless tobacco: Never   Tobacco comments:    Smoking 4-5 cigarettes daily on average  Vaping Use   Vaping Use: Never used  Substance and Sexual Activity   Alcohol use: No    Alcohol/week: 0.0 standard drinks   Drug use: No   Sexual activity: Not Currently  Other Topics Concern   Not on file  Social History Narrative   Not on file   Social Determinants of Health   Financial Resource Strain: Low Risk    Difficulty of Paying Living Expenses: Not hard at all  Food Insecurity: No Food Insecurity   Worried About Charity fundraiser in the Last Year: Never true   [place] in the Last Year: Never true  Transportation Needs: No Transportation Needs   Lack of Transportation (Medical): No   Lack of Transportation (Non-Medical): No  Physical Activity: Inactive   Days of Exercise per Week: 0 days   Minutes of Exercise per Session: 0 min  Stress: Stress Concern Present   Feeling of Stress : To some extent  Social Connections: Socially Isolated   Frequency of Communication with  Friends and Family: More than three times a week   Frequency of Social Gatherings with Friends and Family: Twice a week   Attends Religious Services: Never   Marine scientist or Organizations: No   Attends Music therapist: Not on file   Marital Status: Widowed  Human resources officer Violence: Not At Risk   Fear of Current or Ex-Partner: No   Emotionally Abused: No   Physically Abused: No   Sexually Abused: No     Family History  Problem Relation Age of Onset   Cancer Father 70  colon   Cancer Sister 63       leukemia    ROS: [x]  Positive   [ ]  Negative   [ ]  All sytems reviewed and are negative  Cardiovascular: []  chest pain/pressure []  palpitations []  SOB lying flat []  DOE []  pain in legs while walking []  pain in legs at rest []  pain in legs at night []  non-healing ulcers []  hx of DVT []  swelling in legs  Pulmonary: []  productive cough []  asthma/wheezing []  home O2  Neurologic: []  weakness in []  arms []  legs [x]  numbness in [x]  arms []  legs - right hand []  hx of CVA []  mini stroke [] difficulty speaking or slurred speech []  temporary loss of vision in one eye []  dizziness  Hematologic: [x]  hx of cancer []  bleeding problems []  problems with blood clotting easily  Endocrine:   []  diabetes []  thyroid disease  GI []  vomiting blood []  blood in stool  GU: []  CKD/renal failure []  HD--[]  M/W/F or []  T/T/S []  burning with urination []  blood in urine  Psychiatric: []  anxiety []  depression  Musculoskeletal: []  arthritis []  joint pain  Integumentary: []  rashes []  ulcers  Constitutional: []  fever []  chills   Physical Examination  Vitals:   03/02/21 1828 03/02/21 1854  BP: (!) 168/74 (!) 105/92  Pulse: 66 63  Resp:  16  Temp: (!) 100.8 F (38.2 C) 97.7 F (36.5 C)  SpO2: 98% 97%   Body mass index is 24.81 kg/m.  General:  NAD Gait: Not observed Pulmonary: normal non-labored breathing Cardiac: regular, without   Murmurs, rubs or gallops Abdomen:soft, NT/ND Vascular Exam/Pulses: Left radial and brachial pulse palpable Pretty brisk right brachial signal above the elbow and no signals at the wrist Extremities: Ischemic changes to the right hand that is pale and cool Musculoskeletal: no muscle wasting or atrophy  Neurologic: Able to grossly move his right hand including wiggle his fingers but severe numbness  CBC    Component Value Date/Time   WBC 17.1 (H) 03/02/2021 1809   RBC 5.34 03/02/2021 1809   HGB 13.6 03/02/2021 1811   HGB 13.2 12/11/2020 1536   HGB 12.9 (L) 05/17/2012 1100   HCT 40.0 03/02/2021 1811   HCT 42.7 12/11/2020 1536   HCT 38.2 (L) 05/17/2012 1100   PLT 229 03/02/2021 1809   PLT 228 12/11/2020 1536   MCV 77.7 (L) 03/02/2021 1809   MCV 78 (L) 12/11/2020 1536   MCV 85.8 05/17/2012 1100   MCH 24.5 (L) 03/02/2021 1809   MCHC 31.6 03/02/2021 1809   RDW 18.0 (H) 03/02/2021 1809   RDW 15.4 12/11/2020 1536   RDW 16.0 (H) 05/17/2012 1100   LYMPHSABS 1.7 03/02/2021 1809   LYMPHSABS 1.3 12/11/2020 1536   LYMPHSABS 0.9 05/17/2012 1100   MONOABS 0.7 03/02/2021 1809   MONOABS 0.4 05/17/2012 1100   EOSABS 0.0 03/02/2021 1809   EOSABS 0.2 12/11/2020 1536   BASOSABS 0.1 03/02/2021 1809   BASOSABS 0.1 12/11/2020 1536   BASOSABS 0.0 05/17/2012 1100    BMET    Component Value Date/Time   NA 141 03/02/2021 1811   NA 144 12/11/2020 1536   NA 142 05/17/2012 1100   K 4.0 03/02/2021 1811   K 4.2 05/17/2012 1100   CL 112 (H) 03/02/2021 1811   CL 110 (H) 05/17/2012 1100   CO2 16 (L) 03/02/2021 1809   CO2 23 05/17/2012 1100   GLUCOSE 122 (H) 03/02/2021 1811   GLUCOSE 113 (H) 05/17/2012 1100   BUN 20  03/02/2021 1811   BUN 14 12/11/2020 1536   BUN 16.9 12/14/2012 1446   CREATININE 1.70 (H) 03/02/2021 1811   CREATININE 1.51 (H) 01/03/2017 1108   CREATININE 1.6 (H) 12/14/2012 1446   CALCIUM 9.1 03/02/2021 1809   CALCIUM 9.1 05/17/2012 1100   GFRNONAA 43 (L) 03/02/2021 1809    GFRNONAA 49 (L) 01/03/2017 1108   GFRAA 53 (L) 03/10/2018 1501   GFRAA 57 (L) 01/03/2017 1108    COAGS: Lab Results  Component Value Date   INR 1.0 01/11/2017   INR 1.02 01/10/2017   INR 1.04 11/15/2016     Non-Invasive Vascular Imaging:    Right upper extremity CTA reviewed and shows no flow in the distal brachial artery or filling of the radial ulnar artery.  There is normal opacification of the visualized subclavian, axillary, and proximal brachial artery.   ASSESSMENT/PLAN: This is a 66 y.o. male with multiple comorbidities including tobacco abuse, head and neck cancer status post chemoradiation, hypertension, coronary artery disease that presents with acute ischemia of the right upper extremity.  CTA has been reviewed that shows no  flow in the distal right brachial artery or flow in the radial ulnar artery.  He has an easily palpable radial artery pulse in the contralateral extremity.  All of his symptoms started acutely around 2 PM today.  I cannot get any Doppler flow at the wrist, but he does have a good brachial signal above the elbow.  I have recommended right upper extremity thrombectomy and risk benefits have been discussed including risk of perioperative stroke, MI, risk of anesthesia, limb threat, bleeding, infection etc. Patient wishes to proceed and he will ultimately need embolic work-up with echocardiogram and CTA chest after surgery.  Marty Heck, MD [facility] Office: [place]

## 2021-03-02 NOTE — ED Provider Notes (Signed)
[facility] DEPT Provider Note   CSN: [id number] Arrival date & time: 03/02/21  1745     History  Chief Complaint  Patient presents with   Hand Pain    [name] [name] is a 66 y.o. male hx of CAD, hypertension, high cholesterol who presented with acute onset of right upper extremity pain and numbness.  Patient states that around 2 PM, he felt that his right arm was numb.  He is also states that his right arm is cold.  He denies any chest pain or shortness of breath.  He denies any previous history of blood clots.  He went to triage and was noted to have no pulse in the right upper good extremity.  Patient was brought immediately back to the main ED.  Patient is not on blood thinners  The history is provided by the patient.      Home Medications Prior to Admission medications   Medication Sig Start Date End Date Taking? Authorizing Provider  albuterol (VENTOLIN HFA) 108 (90 Base) MCG/ACT inhaler INHALE 2 PUFFS INTO THE LUNGS EVERY 4 (FOUR) HOURS AS NEEDED FOR WHEEZING OR SHORTNESS OF BREATH (COUGH, SHORTNESS OF BREATH OR WHEEZING.). 12/11/20 12/11/21  Vevelyn Francois, NP  amLODipine (NORVASC) 10 MG tablet TAKE 1 TABLET (10 MG TOTAL) BY MOUTH DAILY. 12/11/20 12/11/21  Vevelyn Francois, NP  atorvastatin (LIPITOR) 40 MG tablet TAKE 1 TABLET (40 MG TOTAL) BY MOUTH DAILY. 12/11/20 12/11/21  Vevelyn Francois, NP  budesonide-formoterol (SYMBICORT) 80-4.5 MCG/ACT inhaler INHALE 2 PUFFS INTO THE LUNGS 2 (TWO) TIMES DAILY. 12/11/20 12/11/21  Vevelyn Francois, NP  hydrALAZINE (APRESOLINE) 25 MG tablet TAKE 2 TABLETS (50 MG TOTAL) BY MOUTH EVERY 8 (EIGHT) HOURS. 12/18/20 12/18/21  Vevelyn Francois, NP  HYDROcodone-acetaminophen (NORCO/VICODIN) 5-325 MG tablet Take 1 tablet by mouth every 4 (four) hours as needed. Patient not taking: Reported on 12/11/2020 07/12/20   Garald Balding, PA-C  hydrocortisone (PROCTOSOL HC) 2.5 % rectal cream Place 1 application rectally 2 (two)  times daily. 12/11/20   Vevelyn Francois, NP  hydrocortisone cream 1 % Apply 1 application topically 2 (two) times daily. 12/11/20   Vevelyn Francois, NP  isosorbide mononitrate (IMDUR) 30 MG 24 hr tablet TAKE 1 TABLET (30 MG TOTAL) BY MOUTH DAILY. 12/31/20 12/31/21  Vevelyn Francois, NP  levothyroxine (SYNTHROID) 112 MCG tablet Take 1 tablet (112 mcg total) by mouth daily. 08/22/20 08/22/21  Vevelyn Francois, NP  metoprolol tartrate (LOPRESSOR) 25 MG tablet TAKE 1 TABLET (25 MG TOTAL) BY MOUTH 2 (TWO) TIMES DAILY. 12/11/20 12/11/21  Vevelyn Francois, NP  mirtazapine (REMERON SOL-TAB) 15 MG disintegrating tablet Take 1 tablet (15 mg total) by mouth at bedtime. 07/10/20 07/10/21  Vevelyn Francois, NP  tamsulosin (FLOMAX) 0.4 MG CAPS capsule Take 1 capsule (0.4 mg total) by mouth daily. 08/21/20 08/21/21  Vevelyn Francois, NP  Vitamin D, Ergocalciferol, (DRISDOL) 1.25 MG (50000 UNIT) CAPS capsule TAKE 1 CAPSULE (50,000 UNITS TOTAL) BY MOUTH EVERY 7 (SEVEN) DAYS. 07/10/20 07/10/21  Vevelyn Francois, NP      Allergies    Codeine    Review of Systems   Review of Systems  Musculoskeletal:        R arm pain   All other systems reviewed and are negative.  Physical Exam Updated Vital Signs BP (!) 129/56    Pulse 92    Resp 19    Ht 5\' 9"  (1.753 m)  Wt 76.2 kg    SpO2 94%    BMI 24.81 kg/m  Physical Exam Vitals and nursing note reviewed.  Constitutional:      Appearance: Normal appearance.     Comments: Uncomfortable  HENT:     Head: Normocephalic.     Nose: Nose normal.     Mouth/Throat:     Mouth: Mucous membranes are moist.  Eyes:     Extraocular Movements: Extraocular movements intact.     Pupils: Pupils are equal, round, and reactive to light.  Cardiovascular:     Rate and Rhythm: Normal rate and regular rhythm.     Pulses: Normal pulses.     Heart sounds: Normal heart sounds.  Pulmonary:     Effort: Pulmonary effort is normal.     Breath sounds: Normal breath sounds.  Abdominal:     General:  Abdomen is flat.     Palpations: Abdomen is soft.  Musculoskeletal:     Cervical back: Normal range of motion and neck supple.     Comments: R arm cold and cyanotic.  Diminished capillary refill.  I was not able to palpate a radial pulse or a ulnar pulse.  Patient does not have a brachial pulse either.  Patient has a very thready axillary pulse.  I was unable to Doppler a radial or brachial pulse and only has a very faintly dopplerable axillary pulse  Skin:    General: Skin is warm.     Capillary Refill: Capillary refill takes less than 2 seconds.  Neurological:     General: No focal deficit present.     Mental Status: He is alert and oriented to person, place, and time.  Psychiatric:        Mood and Affect: Mood normal.        Behavior: Behavior normal.    ED Results / Procedures / Treatments   Labs (all labs ordered are listed, but only abnormal results are displayed) Labs Reviewed  I-STAT CHEM 8, ED - Abnormal; Notable for the following components:      Result Value   Chloride 112 (*)    Creatinine, Ser 1.70 (*)    Glucose, Bld 122 (*)    TCO2 17 (*)    All other components within normal limits  RESP PANEL BY RT-PCR (FLU A&B, COVID) ARPGX2  CBC WITH DIFFERENTIAL/PLATELET  COMPREHENSIVE METABOLIC PANEL  TROPONIN I (HIGH SENSITIVITY)    EKG EKG Interpretation  Date/Time:  Monday March 02 2021 18:02:53 EST Ventricular Rate:  70 PR Interval:  147 QRS Duration: 103 QT Interval:  445 QTC Calculation: 481 R Axis:   53 Text Interpretation: Sinus rhythm Borderline prolonged QT interval Baseline wander in lead(s) I III aVL aVF V2 V3 No significant change since last tracing Confirmed by Wandra Arthurs [id number]) on 03/02/2021 6:16:15 PM  Radiology No results found.  Procedures Procedures    CRITICAL CARE Performed by: Wandra Arthurs   Total critical care time: 30 minutes  Critical care time was exclusive of separately billable procedures and treating other  patients.  Critical care was necessary to treat or prevent imminent or life-threatening deterioration.  Critical care was time spent personally by me on the following activities: development of treatment plan with patient and/or surrogate as well as nursing, discussions with consultants, evaluation of patient's response to treatment, examination of patient, obtaining history from patient or surrogate, ordering and performing treatments and interventions, ordering and review of laboratory studies, ordering and review of radiographic studies,  pulse oximetry and re-evaluation of patient's condition.    Medications Ordered in ED Medications  morphine 4 MG/ML injection 4 mg (has no administration in time range)  ondansetron (ZOFRAN) injection 4 mg (has no administration in time range)  iohexol (OMNIPAQUE) 350 MG/ML injection 80 mL (80 mLs Intravenous Contrast Given 03/02/21 1819)    ED Course/ Medical Decision Making/ A&P                           Medical Decision Making  [name] [name] is a 66 y.o. male here with acute onset of right upper extremity pain.  Patient's right arm appears very cold.  I was unable to Doppler a radial or popliteal pulse.  I consulted Dr. Carlis Abbott from vascular surgery emergently.  He recommend heparin and emergent transfer to [facility] ED.  Clare Gandy at [facility], who will be accepting doctor. CTA performed with results pending    This patient presents to the ED for concern of right arm numbness and cyanosis, this involves an extensive number of treatment options, and is a complaint that carries with it a high risk of complications and morbidity.  The differential diagnosis includes arterial occlusion of the right arm, dissection   Co morbidities that complicate the patient evaluation CAD, hypertension   Additional history obtained: Additional history obtained from patient External records from outside source obtained and reviewed including chart review   Lab Tests: I  Ordered, and personally interpreted labs.  The pertinent results include: Chemistry which is baseline renal insufficiency with creatinine 1.7   Imaging Studies ordered: I ordered imaging studies including CTA right upper extremity I independently visualized and interpreted imaging which were pending at the time of transfer I agree with the radiologist interpretation   Cardiac Monitoring: The patient was maintained on a cardiac monitor.  I personally viewed and interpreted the cardiac monitored which showed an underlying rhythm of: Normal sinus rhythm   Medicines ordered and prescription drug management: I ordered medication including morphine for pain and heparin for possible arterial occlusion Reevaluation of the patient after these medicines showed that the patient improved I have reviewed the patients home medicines and have made adjustments as needed   Test Considered: CBC CMP, COVID test, CTA of the right upper extremity   Critical Interventions: Heparin   Consultations Obtained: I requested consultation with the vascular surgeon, Dr. Carlis Abbott,  and discussed lab and imaging findings as well as pertinent plan - they recommend: Heparin and emergent transfer to [facility]. Dr. Regenia Skeeter accepting    Problem List / ED Course: R arm arterial occlusion   Reevaluation: After the interventions noted above, I reevaluated the patient and found that they have :stayed the same   Social Determinants of Health:    Dispostion: After consideration of the diagnostic results and the patients response to treatment, I feel that the patent would benefit from transfer to [facility] and see vascular surgery.  Final Clinical Impression(s) / ED Diagnoses Final diagnoses:  None    Rx / DC Orders ED Discharge Orders     None         Drenda Freeze, MD 03/02/21 [phone]

## 2021-03-02 NOTE — Progress Notes (Signed)
[facility] RN Report to floor given  Gave report to  [employer]. Room: 4E08.  Discussed surgery, meds given in OR and [facility], VS, IV fluids given, EBL, urine output, pain and other pertinent information. Also discussed if pt had any family or friends here or belongings with them.   Discussed pt is on Heparin at 1300 units/hr running. +CSM to R arm, cap refill < 3 seconds, pain is 2/10, able to palpate R radial pulse +2.  Pt exits my care.

## 2021-03-02 NOTE — ED Provider Notes (Signed)
Emergency Medicine Provider Triage Evaluation Note  [name] [name] , a 66 y.o. male  was evaluated in triage.  Pt complains of right hand pain, numbness, and discoloration that occurred around 2PM today. He endorses severe pain to right hand. No known injury. Admits to blue/white discoloration of hand.   Review of Systems  Positive: Color pain, hand pain Negative:   Physical Exam  There were no vitals taken for this visit. Gen:   Awake, no distress   Resp:  Normal effort  MSK:   Moves extremities without difficulty  Other:  Unable to palpate right radial pulse. Right hand white with blue tint compared to left hand  Medical Decision Making  Medically screening exam initiated at 5:55 PM.  Appropriate orders placed.  [name] [name] was informed that the remainder of the evaluation will be completed by another provider, this initial triage assessment does not replace that evaluation, and the importance of remaining in the ED until their evaluation is complete.  Concern for arterial occlusion. Patient brought immediately to room 15 from triage.    Suzy Bouchard, PA-C 03/02/21 1757    Drenda Freeze, MD 03/06/21 [phone]

## 2021-03-02 NOTE — Progress Notes (Signed)
ANTICOAGULATION CONSULT NOTE - Initial Consult  Pharmacy Consult for IV heparin  Indication: possible arterial clot R upper extremity  Allergies  Allergen Reactions   Codeine Nausea Only    Patient Measurements: Height: 5\' 9"  (175.3 cm) Weight: 76.2 kg (168 lb) IBW/kg (Calculated) : 70.7 Heparin Dosing Weight: actual body weight   Vital Signs: BP: 129/56 (01/02 1800) Pulse Rate: 92 (01/02 1800)  Labs: Recent Labs    03/02/21 1809 03/02/21 1811  HGB 13.1 13.6  HCT 41.5 40.0  PLT 229  --   CREATININE  --  1.70*    Estimated Creatinine Clearance: 42.7 mL/min (A) (by C-G formula based on SCr of 1.7 mg/dL (H)).   Medical History: Past Medical History:  Diagnosis Date   Arthritis    Chronic right ear pain 07/2019   Coronary artery disease    Depression 06/12/11   Buproprion per Dr. Lamonte Sakai   Drainage from ear, right 07/2019   Dyspnea    Full dentures    Fitting Per Dr. 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 of Cisplatin and Dhydrtion   Skin rash 01/2019   Smoking 06/12/11   1/2 pack/day   Status post chemotherapy 10/30/10 - 11/09/10    2 doses of Q 3 week Cisplatin   Status post radiation therapy 10/11/10 - 12/24/10   Bilateral Neck and Mucosa axis /  70 gray in 35 fractions   Xerostomia     Assessment: 54 y/oM presented to [facility] ED on 03/02/2021 with acute onset of right upper extremity pain and numbness. Pharmacy consulted for IV heparin dosing due to concern for possible arterial clot of R upper extremity. Patient not on anticoagulation PTA. H/H, Pltc WNL.   Goal of Therapy:  Heparin level 0.3-0.7 units/ml Monitor platelets by anticoagulation protocol: Yes   Plan:  Baseline aPTT, PT/INR Heparin 4500 units IV bolus x 1, then start heparin infusion at 1300 units/hr Heparin level 6  hours after initiation Daily CBC, heparin level Monitor closely for s/sx of bleeding   Lindell Spar, PharmD, BCPS Clinical Pharmacist  03/02/2021,6:22 PM

## 2021-03-02 NOTE — Transfer of Care (Signed)
Immediate Anesthesia Transfer of Care Note  Patient: [name] [name]  Procedure(s) Performed: THROMBECTOMY RIGHT SUBCLAVIAN, BRACHIAL, AXILLA, AND RADIAL ARTERY (Right: Arm Upper)  Patient Location: PACU  Anesthesia Type:General  Level of Consciousness: awake, alert  and patient cooperative  Airway & Oxygen Therapy: Patient Spontanous Breathing  Post-op Assessment: Report given to RN and Post -op Vital signs reviewed and stable  Post vital signs: Reviewed and stable  Last Vitals:  Vitals Value Taken Time  BP 98/73 03/02/21 2203  Temp    Pulse 75 03/02/21 2204  Resp 14 03/02/21 2204  SpO2 97 % 03/02/21 2204  Vitals shown include unvalidated device data.  Last Pain:  Vitals:   03/02/21 1919  TempSrc:   PainSc: 10-Worst pain ever         Complications: No notable events documented.

## 2021-03-02 NOTE — Anesthesia Procedure Notes (Signed)
Procedure Name: Intubation Date/Time: 03/02/2021 8:10 PM Performed by: Oletta Lamas, CRNA Pre-anesthesia Checklist: Patient identified, Emergency Drugs available, Suction available and Patient being monitored Patient Re-evaluated:Patient Re-evaluated prior to induction Oxygen Delivery Method: Circle System Utilized Preoxygenation: Pre-oxygenation with 100% oxygen Induction Type: IV induction Ventilation: Mask ventilation without difficulty Laryngoscope Size: Mac and 4 Grade View: Grade I Tube type: Oral Tube size: 7.5 mm Number of attempts: 1 Airway Equipment and Method: Stylet and Oral airway Placement Confirmation: ETT inserted through vocal cords under direct vision, positive ETCO2 and breath sounds checked- equal and bilateral Secured at: 22 cm Tube secured with: Tape Dental Injury: Teeth and Oropharynx as per pre-operative assessment

## 2021-03-02 NOTE — Op Note (Signed)
Date: March 02, 2021  Preoperative diagnosis: Acute ischemia right upper extremity  Postoperative diagnosis: Same  Procedure: Right subclavian, axillary, brachial, radial, and ulnar artery thrombectomy  Surgeon: Dr. Marty Heck, MD  Assistant: Leontine Locket, PA  Indication: Patient is a 66 year old male who presented to the ED as a transfer from [facility] with acute ischemia of the right upper extremity that started around 2 pm this afternoon.  CTA showed no distal perfusion in the right upper extremity beyond the distal brachial artery.  He was taken to the OR for thrombectomy after risk benefits discussed.  An assistant was needed for exposure and expedite the case.  Findings: The right brachial artery was opened just below the antecubital fossa where the radial and ulnar bifurcation was dissected out.  There was very poor inflow after the artery was opened.  I was able to pass a #3 Fogarty proximally and get brisk inflow with no obvious thrombus.  I passed a #2 Fogarty down the radial and got several plugs of acute thrombus but could only get a catheter about two thirds down the ulnar artery.  After we closed up the arteriotomy, I was not happy with the signal in the radial artery.  The arteriotomy was re-opened again and I passed a #3 Fogarty again proximally and got no additional thrombus and had brisk inflow.  I got an additional plug of thrombus from the radial artery.  Again I could only get a Fogarty about two thirds down the ulnar artery.  The arteriotomy in the brachial artery was closed again and a palpable radial pulse at completion.  Anesthesia: General  EBL: 200 mL  Details: Patient was taken to the operating room after informed consent was obtained.  Placed on operative table in supine position.  General endotracheal anesthesia was induced.  The right brachial artery bifurcation was marked with [place].  The right arm was then prepped and draped in standard sterile  fashion.  Subsequently made a longitudinal incision just below the antecubital crease and dissected out the brachial artery as well as the radial and ulnar artery at the bifurcation and all of these were controlled with Vesseloops.  Patient was given additional 3000 units of IV heparin.  Brachial artery was open just above the bifurcation with a transverse arteriotomy with 11 blade scalpel and Potts scissors.  There was very poor inflow.  I passed a #3 Fogarty proximally into the axillary and subclavian artery without any significant resistance and after one pass had very brisk inflow with no obvious thrombus.  I then made multiple passes of a #2 Fogarty down the radial artery and retrieved several plugs of acute appearing thrombus.  I could only get a Fogarty to go about two thirds down the ulnar artery and met resistance and retrieved no additional thrombus.  The arteriotomy was closed with multiple 6-0 Prolene's in interrupted fashion.  When I came off clamps I was unhappy with the brachial pulse and only a monophasic radial signal.  I elected to reopen the arteriotomy after the artery was controlled again.  I then passed a #3 Fogarty again proximally into the axillary and subclavian artery getting very brisk inflow with no additional thrombus.  I passed a #3 Fogarty down the radial and got one additional plug of acute thrombus.  Again I could only get a catheter to go about two thirds down the ulnar artery before I met resistance with no additional thrombus.  The arteriotomy was closed again with multiple  6-0 Prolene's in interrupted fashion after the arteries were flushed with heparinized saline.  I had a palpable brachial and radial pulse at completion.  The incision was irrigated and closed with 3-0 Vicryl and 4-0 Monocryl.  Dermabond applied.  Taken to recovery in stable condition.  Complication: None  Condition: Stable  Marty Heck, MD [facility] Office:  [place]

## 2021-03-02 NOTE — ED Notes (Addendum)
Report gave to [facility] transport and Roselyn Reef, charge RN [facility] ED

## 2021-03-03 ENCOUNTER — Encounter (HOSPITAL_COMMUNITY): Payer: Self-pay | Admitting: Vascular Surgery

## 2021-03-03 DIAGNOSIS — Z9889 Other specified postprocedural states: Secondary | ICD-10-CM

## 2021-03-03 LAB — HEPARIN LEVEL (UNFRACTIONATED)
Heparin Unfractionated: 0.72 IU/mL — ABNORMAL HIGH (ref 0.30–0.70)
Heparin Unfractionated: 0.35 IU/mL (ref 0.30–0.70)
Heparin Unfractionated: 0.32 IU/mL (ref 0.30–0.70)

## 2021-03-03 LAB — BASIC METABOLIC PANEL
Anion gap: 10 (ref 5–15)
BUN: 18 mg/dL (ref 8–23)
CO2: 19 mmol/L — ABNORMAL LOW (ref 22–32)
Calcium: 8.2 mg/dL — ABNORMAL LOW (ref 8.9–10.3)
Chloride: 109 mmol/L (ref 98–111)
Creatinine, Ser: 1.71 mg/dL — ABNORMAL HIGH (ref 0.61–1.24)
GFR, Estimated: 44 mL/min — ABNORMAL LOW (ref >60)
Glucose, Bld: 131 mg/dL — ABNORMAL HIGH (ref 70–99)
Potassium: 4.1 mmol/L (ref 3.5–5.1)
Sodium: 138 mmol/L (ref 135–145)

## 2021-03-03 LAB — CBC
HCT: 35.5 % — ABNORMAL LOW (ref 39.0–52.0)
Hemoglobin: 10.9 g/dL — ABNORMAL LOW (ref 13.0–17.0)
MCH: 24 pg — ABNORMAL LOW (ref 26.0–34.0)
MCHC: 30.7 g/dL (ref 30.0–36.0)
MCV: 78 fL — ABNORMAL LOW (ref 80.0–100.0)
Platelets: 151 10*3/uL (ref 150–400)
RBC: 4.55 MIL/uL (ref 4.22–5.81)
RDW: 17.7 % — ABNORMAL HIGH (ref 11.5–15.5)
WBC: 11.5 10*3/uL — ABNORMAL HIGH (ref 4.0–10.5)
nRBC: 0 % (ref 0.0–0.2)

## 2021-03-03 LAB — LIPID PANEL
Cholesterol: 109 mg/dL (ref 0–200)
HDL: 27 mg/dL — ABNORMAL LOW (ref >40)
LDL Cholesterol: 68 mg/dL (ref 0–99)
Total CHOL/HDL Ratio: 4 RATIO
Triglycerides: 69 mg/dL (ref <150)
VLDL: 14 mg/dL (ref 0–40)

## 2021-03-03 LAB — TROPONIN I (HIGH SENSITIVITY): Troponin I (High Sensitivity): 9 ng/L (ref <18)

## 2021-03-03 MED ORDER — LEVOTHYROXINE SODIUM 112 MCG PO TABS
112.0000 ug | ORAL_TABLET | Freq: Every day | ORAL | Status: DC
  Administered 2021-03-03 – 2021-03-06 (×3): 112 ug via ORAL
  Filled 2021-03-03 (×4): qty 1

## 2021-03-03 MED ORDER — ISOSORBIDE MONONITRATE ER 30 MG PO TB24
30.0000 mg | ORAL_TABLET | Freq: Every day | ORAL | Status: DC
Start: 2021-03-03 — End: 2021-03-06
  Administered 2021-03-03 – 2021-03-06 (×3): 30 mg via ORAL
  Filled 2021-03-03 (×3): qty 1

## 2021-03-03 MED ORDER — BISACODYL 10 MG RE SUPP: 10.0000 mg | Freq: Every day | RECTAL | Status: DC | PRN

## 2021-03-03 MED ORDER — MIRTAZAPINE 15 MG PO TBDP
15.0000 mg | ORAL_TABLET | Freq: Every day | ORAL | Status: DC
  Administered 2021-03-03 – 2021-03-04 (×2): 15 mg via ORAL
  Filled 2021-03-03 (×4): qty 1

## 2021-03-03 MED ORDER — ATORVASTATIN CALCIUM 40 MG PO TABS
40.0000 mg | ORAL_TABLET | Freq: Every day | ORAL | Status: DC
  Administered 2021-03-03 – 2021-03-06 (×4): 40 mg via ORAL
  Filled 2021-03-03 (×4): qty 1

## 2021-03-03 MED ORDER — PHENOL 1.4 % MT LIQD: 1.0000 | OROMUCOSAL | Status: DC | PRN

## 2021-03-03 MED ORDER — MAGNESIUM SULFATE 2 GM/50ML IV SOLN: 2.0000 g | Freq: Every day | INTRAVENOUS | Status: DC | PRN

## 2021-03-03 MED ORDER — METOPROLOL TARTRATE 5 MG/5ML IV SOLN: 2.0000 mg | INTRAVENOUS | Status: DC | PRN

## 2021-03-03 MED ORDER — PANTOPRAZOLE SODIUM 40 MG PO TBEC
40.0000 mg | DELAYED_RELEASE_TABLET | Freq: Every day | ORAL | Status: DC
  Administered 2021-03-03 – 2021-03-06 (×3): 40 mg via ORAL
  Filled 2021-03-03 (×3): qty 1

## 2021-03-03 MED ORDER — HYDROCORTISONE (PERIANAL) 2.5 % EX CREA
1.0000 "application " | TOPICAL_CREAM | Freq: Two times a day (BID) | CUTANEOUS | Status: DC | PRN
  Filled 2021-03-03: qty 28.35

## 2021-03-03 MED ORDER — POTASSIUM CHLORIDE CRYS ER 20 MEQ PO TBCR: 20.0000 meq | EXTENDED_RELEASE_TABLET | Freq: Every day | ORAL | Status: DC | PRN

## 2021-03-03 MED ORDER — HYDROCORTISONE 1 % EX CREA
1.0000 "application " | TOPICAL_CREAM | Freq: Two times a day (BID) | CUTANEOUS | Status: DC | PRN
  Filled 2021-03-03: qty 28

## 2021-03-03 MED ORDER — CEFAZOLIN SODIUM-DEXTROSE 2-4 GM/100ML-% IV SOLN
2.0000 g | Freq: Three times a day (TID) | INTRAVENOUS | Status: AC
  Administered 2021-03-03 (×2): 2 g via INTRAVENOUS
  Filled 2021-03-03 (×2): qty 100

## 2021-03-03 MED ORDER — HYDRALAZINE HCL 20 MG/ML IJ SOLN: 5.0000 mg | INTRAMUSCULAR | Status: DC | PRN

## 2021-03-03 MED ORDER — METOPROLOL TARTRATE 25 MG PO TABS
25.0000 mg | ORAL_TABLET | Freq: Two times a day (BID) | ORAL | Status: DC
  Administered 2021-03-03 – 2021-03-06 (×5): 25 mg via ORAL
  Filled 2021-03-03 (×6): qty 1

## 2021-03-03 MED ORDER — HYDRALAZINE HCL 50 MG PO TABS
50.0000 mg | ORAL_TABLET | Freq: Three times a day (TID) | ORAL | Status: DC
  Administered 2021-03-03 – 2021-03-06 (×7): 50 mg via ORAL
  Filled 2021-03-03 (×8): qty 1

## 2021-03-03 MED ORDER — ALUM & MAG HYDROXIDE-SIMETH 200-200-20 MG/5ML PO SUSP: 15.0000 mL | ORAL | Status: DC | PRN

## 2021-03-03 MED ORDER — MOMETASONE FURO-FORMOTEROL FUM 100-5 MCG/ACT IN AERO
2.0000 | INHALATION_SPRAY | Freq: Two times a day (BID) | RESPIRATORY_TRACT | Status: DC
  Administered 2021-03-03 – 2021-03-06 (×6): 2 via RESPIRATORY_TRACT
  Filled 2021-03-03: qty 8.8

## 2021-03-03 MED ORDER — POLYETHYLENE GLYCOL 3350 17 G PO PACK: 17.0000 g | PACK | Freq: Every day | ORAL | Status: DC | PRN

## 2021-03-03 MED ORDER — HYDROCODONE-ACETAMINOPHEN 5-325 MG PO TABS
1.0000 | ORAL_TABLET | Freq: Four times a day (QID) | ORAL | Status: DC | PRN
  Administered 2021-03-05: 2 via ORAL
  Filled 2021-03-03: qty 2

## 2021-03-03 MED ORDER — SODIUM CHLORIDE 0.9 % IV SOLN: INTRAVENOUS | Status: DC

## 2021-03-03 MED ORDER — MORPHINE SULFATE (PF) 2 MG/ML IV SOLN: 2.0000 mg | INTRAVENOUS | Status: DC | PRN

## 2021-03-03 MED ORDER — LABETALOL HCL 5 MG/ML IV SOLN: 10.0000 mg | INTRAVENOUS | Status: DC | PRN

## 2021-03-03 MED ORDER — ONDANSETRON HCL 4 MG/2ML IJ SOLN: 4.0000 mg | Freq: Four times a day (QID) | INTRAMUSCULAR | Status: DC | PRN

## 2021-03-03 MED ORDER — GUAIFENESIN-DM 100-10 MG/5ML PO SYRP: 15.0000 mL | ORAL_SOLUTION | ORAL | Status: DC | PRN

## 2021-03-03 MED ORDER — ACETAMINOPHEN 650 MG RE SUPP: 325.0000 mg | RECTAL | Status: DC | PRN

## 2021-03-03 MED ORDER — ACETAMINOPHEN 325 MG PO TABS: 325.0000 mg | ORAL_TABLET | ORAL | Status: DC | PRN

## 2021-03-03 MED ORDER — TAMSULOSIN HCL 0.4 MG PO CAPS
0.4000 mg | ORAL_CAPSULE | Freq: Every day | ORAL | Status: DC
Start: 2021-03-03 — End: 2021-03-06
  Administered 2021-03-03 – 2021-03-06 (×4): 0.4 mg via ORAL
  Filled 2021-03-03 (×4): qty 1

## 2021-03-03 MED ORDER — AMLODIPINE BESYLATE 10 MG PO TABS
10.0000 mg | ORAL_TABLET | Freq: Every day | ORAL | Status: DC
Start: 2021-03-03 — End: 2021-03-06
  Administered 2021-03-03 – 2021-03-06 (×4): 10 mg via ORAL
  Filled 2021-03-03 (×4): qty 1

## 2021-03-03 MED ORDER — ASPIRIN EC 81 MG PO TBEC
81.0000 mg | DELAYED_RELEASE_TABLET | Freq: Every day | ORAL | Status: DC
  Administered 2021-03-03 – 2021-03-06 (×3): 81 mg via ORAL
  Filled 2021-03-03 (×3): qty 1

## 2021-03-03 MED ORDER — DOCUSATE SODIUM 100 MG PO CAPS
100.0000 mg | ORAL_CAPSULE | Freq: Every day | ORAL | Status: DC
  Administered 2021-03-03 – 2021-03-06 (×4): 100 mg via ORAL
  Filled 2021-03-03 (×4): qty 1

## 2021-03-03 MED ORDER — SODIUM CHLORIDE 0.9 % IV SOLN: 500.0000 mL | Freq: Once | INTRAVENOUS | Status: DC | PRN

## 2021-03-03 MED ORDER — ALBUTEROL SULFATE (2.5 MG/3ML) 0.083% IN NEBU: 3.0000 mL | INHALATION_SOLUTION | RESPIRATORY_TRACT | Status: DC | PRN

## 2021-03-03 NOTE — Progress Notes (Signed)
°  Transition of Care [facility]) Screening Note   Patient Details  Name: [name] [name] Date of Birth: 22-Mar-1955   Transition of Care [facility]) CM/SW Contact:    Dawayne Patricia, RN Phone Number: 03/03/2021, 2:41 PM    Transition of Care Department [facility]) has reviewed patient and no TOC needs have been identified at this time. We will continue to monitor patient advancement through interdisciplinary progression rounds. If new patient transition needs arise, please place a TOC consult.

## 2021-03-03 NOTE — Progress Notes (Signed)
ANTICOAGULATION CONSULT NOTE - Follow Up Consult  Pharmacy Consult for IV Heparin  Indication: Acute RUE ischemia, S/P OR  Allergies  Allergen Reactions   Codeine Nausea Only    Patient Measurements: Height: 5\' 9"  (175.3 cm) Weight: 76.2 kg (168 lb) IBW/kg (Calculated) : 70.7 Heparin Dosing Weight: 76.2 kg  Vital Signs: Temp: 97.7 F (36.5 C) (01/03 1144) Temp Source: Oral (01/03 1144) BP: 110/54 (01/03 1453) Pulse Rate: 62 (01/03 1453)  Labs: Recent Labs    03/02/21 1809 03/02/21 1811 03/02/21 2305 03/03/21 0537 03/03/21 1443  HGB 13.1 13.6  --  10.9*  --   HCT 41.5 40.0  --  35.5*  --   PLT 229  --   --  151  --   HEPARINUNFRC  --   --  >1.10* 0.72* 0.35  CREATININE 1.73* 1.70*  --  1.71*  --   TROPONINIHS 7  --  9  --   --     Estimated Creatinine Clearance: 42.5 mL/min (A) (by C-G formula based on SCr of 1.71 mg/dL (H)).   Assessment: 67 yr old man with acute RUE ischemia, S/P OR with VVS.   Heparin level ~8 hrs after heparin infusion was decreased to 1150 units/hr was 0.35 units/ml, which is within the goal range for this pt. H/H 10.9/35.5, plt 151. Per RN, no issues with IV or bleeding observed.  Goal of Therapy:  Heparin level 0.3-0.7 units/ml Monitor platelets by anticoagulation protocol: Yes   Plan:  Increase heparin infusion to 1200 units/hr Check heparin level in 6 hrs Monitor daily heparin level, CBC Monitor for bleeding  Gillermina Hu, PharmD, BCPS, [facility] Clinical Pharmacist

## 2021-03-03 NOTE — Progress Notes (Signed)
ANTICOAGULATION CONSULT NOTE - Follow Up Consult  Pharmacy Consult for IV Heparin  Indication: Acute RUE ischemia, S/P OR  Allergies  Allergen Reactions   Codeine Nausea Only    Patient Measurements: Height: 5\' 9"  (175.3 cm) Weight: 76.2 kg (168 lb) IBW/kg (Calculated) : 70.7 Heparin Dosing Weight: 76.2 kg  Vital Signs: Temp: 98 F (36.7 C) (01/03 2009) Temp Source: Oral (01/03 2009) BP: 115/60 (01/03 2009) Pulse Rate: 60 (01/03 2009)  Labs: Recent Labs    03/02/21 1809 03/02/21 1811 03/02/21 2305 03/02/21 2305 03/03/21 0537 03/03/21 1443 03/03/21 2211  HGB 13.1 13.6  --   --  10.9*  --   --   HCT 41.5 40.0  --   --  35.5*  --   --   PLT 229  --   --   --  151  --   --   HEPARINUNFRC  --   --  >1.10*   < > 0.72* 0.35 0.32  CREATININE 1.73* 1.70*  --   --  1.71*  --   --   TROPONINIHS 7  --  9  --   --   --   --    < > = values in this interval not displayed.   Estimated Creatinine Clearance: 42.5 mL/min (A) (by C-G formula based on SCr of 1.71 mg/dL (H)).   Assessment: 66 yr old man with acute RUE ischemia, S/P OR with VVS.   Heparin level ~6 hrs after heparin infusion was increased to 1200 units/hr was 0.32 units/ml, which is within the goal range for this pt. H/H 10.9/35.5, plt 151. Per RN, no issues with IV or bleeding observed.  Goal of Therapy:  Heparin level 0.3-0.7 units/ml Monitor platelets by anticoagulation protocol: Yes   Plan:  Increase heparin infusion at 1250 units/hr Check heparin level in 6 hrs Monitor daily heparin level, CBC Monitor for bleeding  Gillermina Hu, PharmD, BCPS, [facility] Clinical Pharmacist

## 2021-03-03 NOTE — Evaluation (Signed)
Physical Therapy Evaluation Patient Details Name: [name] [name] MRN: [id number] DOB: [birth year] Today's Date: 03/03/2021  History of Present Illness  Pt is a 66 y/o male who presented with ischemic right upper extremity due to sudden onset of pain, numbness and discoloration. Imaging showed no perfusion in R UE beyond distal brachial artery. Pt underwent emergent thrombectomy of right subclavian, axillary, brachial, radial, and ulnar artery. PMH: CAD, tobacco use, head/neck cancer s/p radiation, HTN.  Clinical Impression  Patient presents with mobility appropriate for d/c and is without need for skilled PT at this time.  Encouraged continued ambulation in hallway to preserve strength for when stable to d/c home.       Recommendations for follow up therapy are one component of a multi-disciplinary discharge planning process, led by the attending physician.  Recommendations may be updated based on patient status, additional functional criteria and insurance authorization.  Follow Up Recommendations No PT follow up    Assistance Recommended at Discharge    Patient can return home with the following  Other (comment) (no help needed at d/c)    Equipment Recommendations None recommended by PT  Recommendations for Other Services       Functional Status Assessment Patient has not had a recent decline in their functional status     Precautions / Restrictions Precautions Precautions: None      Mobility  Bed Mobility Overal bed mobility: Independent                  Transfers Overall transfer level: Independent Equipment used: None                    Ambulation/Gait Ambulation/Gait assistance: Independent Gait Distance (Feet): 300 Feet Assistive device: None Gait Pattern/deviations: Step-through pattern;WFL(Within Functional Limits)       General Gait Details: no LOB able to demonstrate stepping over simulated obstacle  Stairs Stairs: Yes Stairs assistance:  Supervision Stair Management: One rail Right;Forwards;Alternating pattern Number of Stairs: 3 General stair comments: S for safety with IV  Wheelchair Mobility    Modified Rankin (Stroke Patients Only)       Balance Overall balance assessment: No apparent balance deficits (not formally assessed)                                           Pertinent Vitals/Pain Pain Assessment: No/denies pain    Home Living Family/patient expects to be discharged to:: Private residence Living Arrangements: Children Available Help at Discharge: Family;Available PRN/intermittently Type of Home: House Home Access: Stairs to enter   Entrance Stairs-Number of Steps: 5-6 Alternate Level Stairs-Number of Steps: doesn't have to go to basement Home Layout: Laundry or work area in basement;Two level;Full bath on main level Home Equipment: None      Prior Function Prior Level of Function : Independent/Modified Independent;Driving             Mobility Comments: no use of AD, no falls ADLs Comments: Independent with ADLs, household IADLs shared with daughter, drives. usually goes to grocery store with daughter (reports "sometimes I feel like Im going to pass out walking around the grocery store" reports plan for pulmonary MD visit in Jan related to this)     Hand Dominance   Dominant Hand: Right    Extremity/Trunk Assessment   Upper Extremity Assessment Upper Extremity Assessment: Defer to OT evaluation    Lower  Extremity Assessment Lower Extremity Assessment: Overall WFL for tasks assessed    Cervical / Trunk Assessment Cervical / Trunk Assessment: Normal  Communication   Communication: No difficulties  Cognition Arousal/Alertness: Awake/alert Behavior During Therapy: WFL for tasks assessed/performed Overall Cognitive Status: Within Functional Limits for tasks assessed                                          General Comments General comments  (skin integrity, edema, etc.): VSS throughout on RA    Exercises     Assessment/Plan    PT Assessment Patient does not need any further PT services  PT Problem List         PT Treatment Interventions      PT Goals (Current goals can be found in the Care Plan section)  Acute Rehab PT Goals PT Goal Formulation: All assessment and education complete, DC therapy    Frequency       Co-evaluation               AM-PAC PT "6 Clicks" Mobility  Outcome Measure Help needed turning from your back to your side while in a flat bed without using bedrails?: None Help needed moving from lying on your back to sitting on the side of a flat bed without using bedrails?: None Help needed moving to and from a bed to a chair (including a wheelchair)?: None Help needed standing up from a chair using your arms (e.g., wheelchair or bedside chair)?: None Help needed to walk in hospital room?: None Help needed climbing 3-5 steps with a railing? : None 6 Click Score: 24    End of Session   Activity Tolerance: Patient tolerated treatment well Patient left: in bed;with call bell/phone within reach   PT Visit Diagnosis: Difficulty in walking, not elsewhere classified (R26.2)    Time: 1110-1130 PT Time Calculation (min) (ACUTE ONLY): 20 min   Charges:   PT Evaluation $PT Eval Low Complexity: 1 Low          Magda Kiel, PT Acute Rehabilitation Services Pager:[phone] Office:[phone] 03/03/2021   Reginia Naas 03/03/2021, 5:43 PM

## 2021-03-03 NOTE — Evaluation (Signed)
Occupational Therapy Evaluation Patient Details Name: [name] [name] MRN: [id number] DOB: 04/24/1955 Today's Date: 03/03/2021   History of Present Illness Pt is a 66 y/o male who presented with ischemic right upper extremity due to sudden onset of pain, numbness and discoloration. Imaging showed no perfusion in R UE beyond distal brachial artery. Pt underwent emergent thrombectomy of right subclavian, axillary, brachial, radial, and ulnar artery. PMH: CAD, tobacco use, head/neck cancer s/p radiation, HTN.   Clinical Impression   PTA, pt lives with daughter and reports Independence in all daily tasks without need for AD. Pt presents s/p procedure above without pain, impaired sensation or coordination of R dominant UE. Pt able to complete in-room mobility, ADLs and small item manipulation/UE ROM without difficulties. Encouraged elevation, continued ROM of R UE and avoiding heavy lifting initially at home. VSS on RA. Anticipate no further skilled OT needs at acute level or at DC.     Recommendations for follow up therapy are one component of a multi-disciplinary discharge planning process, led by the attending physician.  Recommendations may be updated based on patient status, additional functional criteria and insurance authorization.   Follow Up Recommendations  No OT follow up    Assistance Recommended at Discharge None  Patient can return home with the following      Functional Status Assessment  Patient has not had a recent decline in their functional status  Equipment Recommendations  None recommended by OT    Recommendations for Other Services       Precautions / Restrictions Precautions Precautions: None Restrictions Weight Bearing Restrictions: No      Mobility Bed Mobility Overal bed mobility: Independent                  Transfers Overall transfer level: Independent Equipment used: None                      Balance Overall balance assessment: No  apparent balance deficits (not formally assessed)                                         ADL either performed or assessed with clinical judgement   ADL Overall ADL's : Independent                                       General ADL Comments: initially supervision to ensure safety with first time OOB since surgery, no difficulties with bed mobility, transfers, mobility in room without AD and basic grooming tasks manipulating small objects and reaching above head to brush hair during task. denies dizziness     Vision Baseline Vision/History: 1 Wears glasses Ability to See in Adequate Light: 0 Adequate Patient Visual Report: No change from baseline Vision Assessment?: No apparent visual deficits     Perception     Praxis      Pertinent Vitals/Pain Pain Assessment: No/denies pain     Hand Dominance Right   Extremity/Trunk Assessment Upper Extremity Assessment Upper Extremity Assessment: RUE deficits/detail RUE Deficits / Details: small incision around elbow, denies decreased sensation or pain with movement, able to manipulate small objects with ease and reports feeling much better s/p surgery. encouraged elevation, gentle ROM and to avoid heavy lifting RUE Sensation: WNL RUE Coordination: WNL   Lower Extremity Assessment Lower  Extremity Assessment: Defer to PT evaluation   Cervical / Trunk Assessment Cervical / Trunk Assessment: Normal   Communication Communication Communication: No difficulties   Cognition Arousal/Alertness: Awake/alert Behavior During Therapy: WFL for tasks assessed/performed Overall Cognitive Status: Within Functional Limits for tasks assessed                                       General Comments  BP WFL pre and post activity    Exercises     Shoulder Instructions      Home Living Family/patient expects to be discharged to:: Private residence Living Arrangements: Children Available Help at  Discharge: Family;Available PRN/intermittently Type of Home: House Home Access: Stairs to enter [employer] of Steps: 5-6   Home Layout: One level;Bed/bath upstairs     Bathroom Shower/Tub: Teacher, early years/pre: Standard     Home Equipment: None          Prior Functioning/Environment Prior Level of Function : Independent/Modified Independent;Driving             Mobility Comments: no use of AD, no falls ADLs Comments: Independent with ADLs, household IADLs shared with daughter, drives. usually goes to grocery store with daughter (reports "sometimes I feel like Im going to pass out walking around the grocery store" reports plan for pulmonary MD visit in Jan related to this)        OT Problem List:        OT Treatment/Interventions:      OT Goals(Current goals can be found in the care plan section) Acute Rehab OT Goals Patient Stated Goal: find out cause of current medical issue and prevent from happening again OT Goal Formulation: With patient  OT Frequency:      Co-evaluation              AM-PAC OT "6 Clicks" Daily Activity     Outcome Measure Help from another person eating meals?: None Help from another person taking care of personal grooming?: None Help from another person toileting, which includes using toliet, bedpan, or urinal?: None Help from another person bathing (including washing, rinsing, drying)?: None Help from another person to put on and taking off regular upper body clothing?: None Help from another person to put on and taking off regular lower body clothing?: None 6 Click Score: 24   End of Session Nurse Communication: Mobility status  Activity Tolerance: Patient tolerated treatment well Patient left: in chair;with call bell/phone within reach  OT Visit Diagnosis: Pain Pain - Right/Left: Right Pain - part of body: Arm                Time: 0981-1914 OT Time Calculation (min): 21 min Charges:  OT General  Charges $OT Visit: 1 Visit OT Evaluation $OT Eval Low Complexity: 1 Low  Malachy Chamber, OTR/L Acute Rehab Services Office: [phone]   Layla Maw 03/03/2021, 8:24 AM

## 2021-03-03 NOTE — Progress Notes (Signed)
ANTICOAGULATION CONSULT NOTE - Follow Up Consult  Pharmacy Consult for Heparin  Indication: Acute RUE ischemia s/p OR  Allergies  Allergen Reactions   Codeine Nausea Only    Patient Measurements: Height: 5\' 9"  (175.3 cm) Weight: 76.2 kg (168 lb) IBW/kg (Calculated) : 70.7   Vital Signs: Temp: 97.5 F (36.4 C) (01/03 0357) Temp Source: Oral (01/03 0357) BP: 124/73 (01/03 0544) Pulse Rate: 63 (01/03 0400)  Labs: Recent Labs    03/02/21 1809 03/02/21 1811 03/02/21 2305 03/03/21 0537  HGB 13.1 13.6  --  10.9*  HCT 41.5 40.0  --  35.5*  PLT 229  --   --  151  HEPARINUNFRC  --   --  >1.10* 0.72*  CREATININE 1.73* 1.70*  --   --   TROPONINIHS 7  --  9  --     Estimated Creatinine Clearance: 42.7 mL/min (A) (by C-G formula based on SCr of 1.7 mg/dL (H)).    Assessment: 66 y/o M with acute RUE ischemia s/p OR with VVS. Heparin level is just above goal. Hgb down some post-op.    Goal of Therapy:  Heparin level 0.3-0.7 units/ml Monitor platelets by anticoagulation protocol: Yes   Plan:  Dec heparin to 1150 units/hr 1400 heparin level  Narda Bonds, PharmD, BCPS Clinical Pharmacist Phone: [phone]

## 2021-03-03 NOTE — Progress Notes (Signed)
Mobility Specialist: Progress Note   03/03/21 1756  Mobility  Activity Ambulated in hall  Level of Assistance Independent after set-up  Assistive Device None  Distance Ambulated (ft) 470 ft  Mobility Ambulated independently in hallway  Mobility Response Tolerated well  Mobility performed by Mobility specialist  $Mobility charge 1 Mobility   Pre-Mobility: 60 HR, 91% SpO2 Post-Mobility: 73 HR, 95% SpO2  Pt had no c/o throughout ambulation. Pt back to bed after walk with call bell and phone at his side.   [facility] Parul Porcelli Mobility Specialist Mobility Specialist 4 [place]: [phone] Mobility Specialist 2 [place] and [place]: [phone]

## 2021-03-03 NOTE — Progress Notes (Signed)
[facility] [place]  Subjective  -states his right hand feels better   Objective 116/65 62 (!) 97.5 F (36.4 C) (Oral) 18 94%  Intake/Output Summary (Last 24 hours) at 03/03/2021 0658 Last data filed at 03/03/2021 0600 Gross per 24 hour  Intake 2535.99 ml  Output 850 ml  Net 1685.99 ml    Right brachial incision clean and dry Right radial palpable Right hand better perfused  Laboratory Lab Results: Recent Labs    03/02/21 1809 03/02/21 1811 03/03/21 0537  WBC 17.1*  --  11.5*  HGB 13.1 13.6 10.9*  HCT 41.5 40.0 35.5*  PLT 229  --  151   BMET Recent Labs    03/02/21 1809 03/02/21 1811 03/03/21 0537  NA 139 141 138  K 3.9 4.0 4.1  CL 112* 112* 109  CO2 16*  --  19*  GLUCOSE 127* 122* 131*  BUN 23 20 18   CREATININE 1.73* 1.70* 1.71*  CALCIUM 9.1  --  8.2*    COAG Lab Results  Component Value Date   INR 1.0 01/11/2017   INR 1.02 01/10/2017   INR 1.04 11/15/2016   No results found for: PTT  Assessment/Planning:  Postop day 1 status post right subclavian, axillary, brachial, radial and ulnar artery thrombectomy after presented to [facility] long ED with acute right upper extremity ischemia.  We retrieved thrombus from the right radial artery.  He will need further work-up with an echocardiogram that is ordered today for evaluation of embolic source.  We will also need CTA chest but we will hydrate today given a baseline creatinine of 1.7 and he got a CTA last night in the ED.  Overall looks good with a palpable pulse.  Continue heparin for now.  Marty Heck 03/03/2021 6:58 AM --

## 2021-03-04 ENCOUNTER — Inpatient Hospital Stay (HOSPITAL_COMMUNITY): Payer: Medicare Other

## 2021-03-04 DIAGNOSIS — I998 Other disorder of circulatory system: Secondary | ICD-10-CM

## 2021-03-04 DIAGNOSIS — I10 Essential (primary) hypertension: Secondary | ICD-10-CM

## 2021-03-04 LAB — ECHOCARDIOGRAM COMPLETE
AR max vel: 2.49 cm2
AV Peak grad: 8.3 mmHg
Ao pk vel: 1.44 m/s
Area-P 1/2: 4.26 cm2
Calc EF: 62.2 %
Height: 69 in
S' Lateral: 3 cm
Single Plane A2C EF: 61.9 %
Single Plane A4C EF: 62.2 %
Weight: 2688 oz

## 2021-03-04 LAB — BASIC METABOLIC PANEL
Anion gap: 6 (ref 5–15)
BUN: 23 mg/dL (ref 8–23)
CO2: 20 mmol/L — ABNORMAL LOW (ref 22–32)
Calcium: 8.4 mg/dL — ABNORMAL LOW (ref 8.9–10.3)
Chloride: 113 mmol/L — ABNORMAL HIGH (ref 98–111)
Creatinine, Ser: 1.48 mg/dL — ABNORMAL HIGH (ref 0.61–1.24)
GFR, Estimated: 52 mL/min — ABNORMAL LOW (ref >60)
Glucose, Bld: 92 mg/dL (ref 70–99)
Potassium: 3.9 mmol/L (ref 3.5–5.1)
Sodium: 139 mmol/L (ref 135–145)

## 2021-03-04 LAB — CBC
HCT: 34.4 % — ABNORMAL LOW (ref 39.0–52.0)
Hemoglobin: 10.4 g/dL — ABNORMAL LOW (ref 13.0–17.0)
MCH: 24 pg — ABNORMAL LOW (ref 26.0–34.0)
MCHC: 30.2 g/dL (ref 30.0–36.0)
MCV: 79.4 fL — ABNORMAL LOW (ref 80.0–100.0)
Platelets: 148 10*3/uL — ABNORMAL LOW (ref 150–400)
RBC: 4.33 MIL/uL (ref 4.22–5.81)
RDW: 17.7 % — ABNORMAL HIGH (ref 11.5–15.5)
WBC: 12 10*3/uL — ABNORMAL HIGH (ref 4.0–10.5)
nRBC: 0 % (ref 0.0–0.2)

## 2021-03-04 LAB — SURGICAL PATHOLOGY

## 2021-03-04 LAB — HEPARIN LEVEL (UNFRACTIONATED): Heparin Unfractionated: 0.41 IU/mL (ref 0.30–0.70)

## 2021-03-04 MED ORDER — IOHEXOL 350 MG/ML SOLN
80.0000 mL | Freq: Once | INTRAVENOUS | Status: AC | PRN
  Administered 2021-03-04: 80 mL via INTRAVENOUS

## 2021-03-04 NOTE — Anesthesia Postprocedure Evaluation (Signed)
Anesthesia Post Note  Patient: [name]  Procedure(s) Performed: THROMBECTOMY RIGHT SUBCLAVIAN, BRACHIAL, AXILLA, AND RADIAL ARTERY (Right: Arm Upper)     Patient location during evaluation: PACU Anesthesia Type: General Level of consciousness: awake and alert Pain management: pain level controlled Vital Signs Assessment: post-procedure vital signs reviewed and stable Respiratory status: spontaneous breathing, nonlabored ventilation, respiratory function stable and patient connected to nasal cannula oxygen Cardiovascular status: blood pressure returned to baseline and stable Postop Assessment: no apparent nausea or vomiting Anesthetic complications: no   No notable events documented.  Last Vitals:  Vitals:   03/04/21 0500 03/04/21 0742  BP:  116/63  Pulse:  (!) 52  Resp: 18 16  Temp:  36.4 C  SpO2:  95%    Last Pain:  Vitals:   03/04/21 0742  TempSrc: Oral  PainSc:                  [place]

## 2021-03-04 NOTE — Progress Notes (Signed)
ANTICOAGULATION CONSULT NOTE - Follow Up Consult  Pharmacy Consult for IV Heparin  Indication: Acute RUE ischemia, S/P OR  Allergies  Allergen Reactions   Codeine Nausea Only    Patient Measurements: Height: 5\' 9"  (175.3 cm) Weight: 76.2 kg (168 lb) IBW/kg (Calculated) : 70.7 Heparin Dosing Weight: 76.2 kg  Vital Signs: Temp: 97.6 F (36.4 C) (01/04 0742) Temp Source: Oral (01/04 0742) BP: 116/68 (01/04 0923) Pulse Rate: 53 (01/04 0923)  Labs: Recent Labs    03/02/21 1809 03/02/21 1811 03/02/21 2305 03/02/21 2305 03/03/21 0537 03/03/21 1443 03/03/21 2211 03/04/21 0458 03/04/21 0717  HGB 13.1 13.6  --   --  10.9*  --   --  10.4*  --   HCT 41.5 40.0  --   --  35.5*  --   --  34.4*  --   PLT 229  --   --   --  151  --   --  148*  --   HEPARINUNFRC  --   --  >1.10*   < > 0.72* 0.35 0.32 0.41  --   CREATININE 1.73* 1.70*  --   --  1.71*  --   --   --  1.48*  TROPONINIHS 7  --  9  --   --   --   --   --   --    < > = values in this interval not displayed.    Estimated Creatinine Clearance: 49.1 mL/min (A) (by C-G formula based on SCr of 1.48 mg/dL (H)).   Assessment: 66 yr old man with acute RUE ischemia, S/P OR with VVS.   03/03/2021- Heparin level ~6 hrs after heparin infusion was increased to 1200 units/hr was 0.32 units/ml, which is within the goal range for this pt. H/H 10.9/35.5, plt 151. Per RN, no issues with IV or bleeding observed.  Goal of Therapy:  Heparin level 0.3-0.7 units/ml Monitor platelets by anticoagulation protocol: Yes   Plan:  Continue heparin infusion at 1250 units/hr Check heparin level with AM labs 03/05/2021 Monitor daily heparin level, CBC Monitor for bleeding  Vaughan Basta BS, PharmD, BCPS Clinical Pharmacist 03/04/2021 10:11 AM

## 2021-03-04 NOTE — Progress Notes (Addendum)
PHARMACIST LIPID MONITORING   [name] [name] is a 66 y.o. male admitted on 03/02/2021 with acute ischemia of the right upper extremity who is status post right subclavian, axillary, brachial, radial, and ulnar artery thrombectomy.  Pharmacy has been consulted to optimize lipid-lowering therapy with the indication of secondary prevention for clinical ASCVD.  Recent Labs:  Lipid Panel (last 6 months):   Lab Results  Component Value Date   CHOL 109 03/03/2021   TRIG 69 03/03/2021   HDL 27 (L) 03/03/2021   CHOLHDL 4.0 03/03/2021   VLDL 14 03/03/2021   LDLCALC 68 03/03/2021    Hepatic function panel (last 6 months):   Lab Results  Component Value Date   AST 18 03/02/2021   ALT 22 03/02/2021   ALKPHOS 101 03/02/2021   BILITOT 0.7 03/02/2021    SCr (since admission):   Serum creatinine: 1.48 mg/dL (H) 03/04/21 0717 Estimated creatinine clearance: 49.1 mL/min (A)  Current therapy and lipid therapy tolerance Current lipid-lowering therapy: lipitor 40 mg po qday  Previous lipid-lowering therapies (if applicable): same as current Documented or reported allergies or intolerances to lipid-lowering therapies (if applicable): none noted within the EMR  Assessment:   Patient is going to OR today for stenting.   Plan:    1.Statin intensity (high intensity recommended for all patients regardless of the LDL):  No statin changes. The patient is already on a high intensity statin.  2.Add ezetimibe (if any one of the following):   Not indicated at this time.  3.Refer to lipid clinic:   No  4.Follow-up with:  Cardiology provider - Shelva Majestic, MD  5.Follow-up labs after discharge:  No changes in lipid therapy, repeat a lipid panel in one year.       Thank you for allowing me to participate in this patient's care.    Eusebio Friendly, PharmD 03/05/2021, 7:23 AM

## 2021-03-04 NOTE — Progress Notes (Signed)
I have reviewed CTA chest that shows right subclavian calcified stenosis that appears high grade.  I suspect this is etiology for right arm ischemia with embolic event.  I have recommended aortogram with right upper extremity arteriogram tomorrow with the patient and likely stent placement.  He wishes to proceed.  Marty Heck, MD [facility] [place] Office: [place]

## 2021-03-04 NOTE — Progress Notes (Addendum)
Progress Note    03/04/2021 6:58 AM 2 Days Post-Op  Subjective:  no complaints; says his hand feels back to normal  Afebrile HR 50's-60's  626'R-485'I systolic 62% RA  Vitals:   03/04/21 0358 03/04/21 0500  BP: 105/73   Pulse: 60   Resp: 18 18  Temp: 97.9 F (36.6 C)   SpO2: 97%     Physical Exam: Cardiac:  regular Lungs:  non labored Incisions:  clean and dry Extremities:  easily palpable right radial pulse; motor and sensory in tact right hand.   CBC    Component Value Date/Time   WBC 12.0 (H) 03/04/2021 0458   RBC 4.33 03/04/2021 0458   HGB 10.4 (L) 03/04/2021 0458   HGB 13.2 12/11/2020 1536   HGB 12.9 (L) 05/17/2012 1100   HCT 34.4 (L) 03/04/2021 0458   HCT 42.7 12/11/2020 1536   HCT 38.2 (L) 05/17/2012 1100   PLT 148 (L) 03/04/2021 0458   PLT 228 12/11/2020 1536   MCV 79.4 (L) 03/04/2021 0458   MCV 78 (L) 12/11/2020 1536   MCV 85.8 05/17/2012 1100   MCH 24.0 (L) 03/04/2021 0458   MCHC 30.2 03/04/2021 0458   RDW 17.7 (H) 03/04/2021 0458   RDW 15.4 12/11/2020 1536   RDW 16.0 (H) 05/17/2012 1100   LYMPHSABS 1.7 03/02/2021 1809   LYMPHSABS 1.3 12/11/2020 1536   LYMPHSABS 0.9 05/17/2012 1100   MONOABS 0.7 03/02/2021 1809   MONOABS 0.4 05/17/2012 1100   EOSABS 0.0 03/02/2021 1809   EOSABS 0.2 12/11/2020 1536   BASOSABS 0.1 03/02/2021 1809   BASOSABS 0.1 12/11/2020 1536   BASOSABS 0.0 05/17/2012 1100    BMET    Component Value Date/Time   NA 138 03/03/2021 0537   NA 144 12/11/2020 1536   NA 142 05/17/2012 1100   K 4.1 03/03/2021 0537   K 4.2 05/17/2012 1100   CL 109 03/03/2021 0537   CL 110 (H) 05/17/2012 1100   CO2 19 (L) 03/03/2021 0537   CO2 23 05/17/2012 1100   GLUCOSE 131 (H) 03/03/2021 0537   GLUCOSE 113 (H) 05/17/2012 1100   BUN 18 03/03/2021 0537   BUN 14 12/11/2020 1536   BUN 16.9 12/14/2012 1446   CREATININE 1.71 (H) 03/03/2021 0537   CREATININE 1.51 (H) 01/03/2017 1108   CREATININE 1.6 (H) 12/14/2012 1446   CALCIUM 8.2 (L)  03/03/2021 0537   CALCIUM 9.1 05/17/2012 1100   GFRNONAA 44 (L) 03/03/2021 0537   GFRNONAA 49 (L) 01/03/2017 1108   GFRAA 53 (L) 03/10/2018 1501   GFRAA 57 (L) 01/03/2017 1108    INR    Component Value Date/Time   INR 1.0 01/11/2017 1157     Intake/Output Summary (Last 24 hours) at 03/04/2021 0658 Last data filed at 03/04/2021 0656 Gross per 24 hour  Intake 1793.86 ml  Output 1600 ml  Net 193.86 ml     Assessment/Plan:  66 y.o. male is s/p:  right subclavian, axillary, brachial, radial and ulnar artery thrombectomy  2 Days Post-Op   -pt with easily palpable right radial pulse and motor and sensory are in tact right hand.  Lab is in the room currently to draw BMP to check renal function.   -OOB to chair tid at meal times and ambulate (pt ambulated 473ft yesterday with mobility team).  OT and PT recommend no follow up.   -2D echo ordered but not yet done.  Awaiting labs today to check creatinine as he will need CTA chest -pathology from  thrombus in OR still pending. -DVT prophylaxis:  heparin gtt   Leontine Locket, PA-C [facility] [phone] 03/04/2021 6:58 AM  I have seen and evaluated the patient. I agree with the PA note as documented above.  Postop day 2 status post right subclavian, axillary, brachial, radial and ulnar artery thrombectomy for acute limb ischemia.  Incision looks good.  He has a palpable radial pulse at the wrist.  Hand feels much better.  Continue heparin this morning.  Echocardiogram ordered yesterday and has not been done for cardioembolic work-up.  CTA chest ordered this morning for cardioembolic work-up as well and his creatinine has improved to 1.4 with hydration.  We will hold on transition to [place] until embolic work-up complete.  Marty Heck, MD [facility] [place] Office: [phone]

## 2021-03-04 NOTE — Progress Notes (Signed)
Mobility Specialist: Progress Note   03/04/21 1626  Mobility  Activity Ambulated in hall  Level of Assistance Independent  Assistive Device None  Distance Ambulated (ft) 470 ft  Mobility Ambulated independently in hallway  Mobility Response Tolerated well  Mobility performed by Mobility specialist  Bed Position Chair  $Mobility charge 1 Mobility   Pt had no c/o throughout ambulation. Pt set-up at the sink per NT request after walk to get washed up.   [facility] Lamarkus Nebel Mobility Specialist Mobility Specialist 4 [place]: [phone] Mobility Specialist 2 [place] and [place]: [phone]

## 2021-03-05 ENCOUNTER — Encounter (HOSPITAL_COMMUNITY)
Admission: EM | Disposition: A | Discharge status: Home / Self Care | Payer: Self-pay | Source: Home / Self Care | Attending: Vascular Surgery

## 2021-03-05 ENCOUNTER — Ambulatory Visit (HOSPITAL_COMMUNITY): Admission: RE | Admit: 2021-03-05 | Payer: Medicare Other | Source: Home / Self Care | Admitting: Vascular Surgery

## 2021-03-05 DIAGNOSIS — I771 Stricture of artery: Secondary | ICD-10-CM

## 2021-03-05 DIAGNOSIS — I742 Embolism and thrombosis of arteries of the upper extremities: Secondary | ICD-10-CM

## 2021-03-05 HISTORY — PX: UPPER EXTREMITY ANGIOGRAPHY: CATH118270

## 2021-03-05 HISTORY — PX: PERIPHERAL VASCULAR INTERVENTION: CATH118257

## 2021-03-05 LAB — BASIC METABOLIC PANEL
Anion gap: 7 (ref 5–15)
BUN: 25 mg/dL — ABNORMAL HIGH (ref 8–23)
CO2: 20 mmol/L — ABNORMAL LOW (ref 22–32)
Calcium: 8.2 mg/dL — ABNORMAL LOW (ref 8.9–10.3)
Chloride: 113 mmol/L — ABNORMAL HIGH (ref 98–111)
Creatinine, Ser: 1.66 mg/dL — ABNORMAL HIGH (ref 0.61–1.24)
GFR, Estimated: 45 mL/min — ABNORMAL LOW (ref >60)
Glucose, Bld: 84 mg/dL (ref 70–99)
Potassium: 4 mmol/L (ref 3.5–5.1)
Sodium: 140 mmol/L (ref 135–145)

## 2021-03-05 LAB — CBC
HCT: 33.4 % — ABNORMAL LOW (ref 39.0–52.0)
Hemoglobin: 10.2 g/dL — ABNORMAL LOW (ref 13.0–17.0)
MCH: 24 pg — ABNORMAL LOW (ref 26.0–34.0)
MCHC: 30.5 g/dL (ref 30.0–36.0)
MCV: 78.6 fL — ABNORMAL LOW (ref 80.0–100.0)
Platelets: 144 10*3/uL — ABNORMAL LOW (ref 150–400)
RBC: 4.25 MIL/uL (ref 4.22–5.81)
RDW: 18 % — ABNORMAL HIGH (ref 11.5–15.5)
WBC: 9 10*3/uL (ref 4.0–10.5)
nRBC: 0 % (ref 0.0–0.2)

## 2021-03-05 LAB — HEPARIN LEVEL (UNFRACTIONATED)
Heparin Unfractionated: >1.1 IU/mL — ABNORMAL HIGH (ref 0.30–0.70)
Heparin Unfractionated: >1.1 IU/mL — ABNORMAL HIGH (ref 0.30–0.70)
Heparin Unfractionated: 0.52 IU/mL (ref 0.30–0.70)

## 2021-03-05 LAB — POCT ACTIVATED CLOTTING TIME: Activated Clotting Time: 191 seconds

## 2021-03-05 SURGERY — UPPER EXTREMITY ANGIOGRAPHY
Anesthesia: LOCAL | Laterality: Right

## 2021-03-05 MED ORDER — FENTANYL CITRATE (PF) 100 MCG/2ML IJ SOLN
INTRAMUSCULAR | Status: AC
  Filled 2021-03-05: qty 2

## 2021-03-05 MED ORDER — SODIUM CHLORIDE 0.9 % IV SOLN: 250.0000 mL | INTRAVENOUS | Status: DC | PRN

## 2021-03-05 MED ORDER — ONDANSETRON HCL 4 MG/2ML IJ SOLN: 4.0000 mg | Freq: Four times a day (QID) | INTRAMUSCULAR | Status: DC | PRN

## 2021-03-05 MED ORDER — HEPARIN (PORCINE) IN NACL 1000-0.9 UT/500ML-% IV SOLN
INTRAVENOUS | Status: AC
  Filled 2021-03-05: qty 1000

## 2021-03-05 MED ORDER — HEPARIN (PORCINE) 25000 UT/250ML-% IV SOLN
650.0000 [IU]/h | INTRAVENOUS | Status: DC
  Administered 2021-03-05: 650 [IU]/h via INTRAVENOUS

## 2021-03-05 MED ORDER — HEPARIN SODIUM (PORCINE) 1000 UNIT/ML IJ SOLN
INTRAMUSCULAR | Status: DC | PRN
  Administered 2021-03-05: 5000 [IU] via INTRAVENOUS

## 2021-03-05 MED ORDER — SODIUM CHLORIDE 0.9 % IV SOLN
INTRAVENOUS | Status: DC
  Administered 2021-03-05: 200 mL via INTRAVENOUS

## 2021-03-05 MED ORDER — IODIXANOL 320 MG/ML IV SOLN
INTRAVENOUS | Status: DC | PRN
  Administered 2021-03-05: 50 mL via INTRA_ARTERIAL

## 2021-03-05 MED ORDER — HYDRALAZINE HCL 20 MG/ML IJ SOLN: 5.0000 mg | INTRAMUSCULAR | Status: DC | PRN

## 2021-03-05 MED ORDER — ASPIRIN EC 81 MG PO TBEC: 81.0000 mg | DELAYED_RELEASE_TABLET | Freq: Every day | ORAL | Status: DC

## 2021-03-05 MED ORDER — SODIUM CHLORIDE 0.9% FLUSH: 3.0000 mL | Freq: Two times a day (BID) | INTRAVENOUS | Status: DC

## 2021-03-05 MED ORDER — FENTANYL CITRATE (PF) 100 MCG/2ML IJ SOLN
INTRAMUSCULAR | Status: DC | PRN
  Administered 2021-03-05: 50 ug via INTRAVENOUS

## 2021-03-05 MED ORDER — MIDAZOLAM HCL 2 MG/2ML IJ SOLN
INTRAMUSCULAR | Status: AC
  Filled 2021-03-05: qty 2

## 2021-03-05 MED ORDER — LIDOCAINE HCL (PF) 1 % IJ SOLN
INTRAMUSCULAR | Status: DC | PRN
  Administered 2021-03-05: 15 mL via INTRADERMAL

## 2021-03-05 MED ORDER — HEPARIN SODIUM (PORCINE) 1000 UNIT/ML IJ SOLN
INTRAMUSCULAR | Status: AC
  Filled 2021-03-05: qty 10

## 2021-03-05 MED ORDER — ACETAMINOPHEN 325 MG PO TABS
650.0000 mg | ORAL_TABLET | ORAL | Status: DC | PRN
  Administered 2021-03-05: 650 mg via ORAL
  Filled 2021-03-05: qty 2

## 2021-03-05 MED ORDER — MIDAZOLAM HCL 2 MG/2ML IJ SOLN
INTRAMUSCULAR | Status: DC | PRN
  Administered 2021-03-05: 1 mg via INTRAVENOUS

## 2021-03-05 MED ORDER — LIDOCAINE HCL (PF) 1 % IJ SOLN
INTRAMUSCULAR | Status: AC
  Filled 2021-03-05: qty 30

## 2021-03-05 MED ORDER — SODIUM CHLORIDE 0.9% FLUSH: 3.0000 mL | INTRAVENOUS | Status: DC | PRN

## 2021-03-05 MED ORDER — HEPARIN (PORCINE) IN NACL 1000-0.9 UT/500ML-% IV SOLN
INTRAVENOUS | Status: DC | PRN
  Administered 2021-03-05 (×2): 500 mL

## 2021-03-05 MED ORDER — HEPARIN (PORCINE) 25000 UT/250ML-% IV SOLN: 950.0000 [IU]/h | INTRAVENOUS | Status: DC

## 2021-03-05 MED ORDER — LABETALOL HCL 5 MG/ML IV SOLN: 10.0000 mg | INTRAVENOUS | Status: DC | PRN

## 2021-03-05 MED ORDER — SODIUM CHLORIDE 0.9 % WEIGHT BASED INFUSION
1.0000 mL/kg/h | INTRAVENOUS | Status: AC
  Administered 2021-03-05: 1 mL/kg/h via INTRAVENOUS

## 2021-03-05 SURGICAL SUPPLY — 17 items
CATH ANGIO 5F BER2 100CM (CATHETERS) ×1 IMPLANT
CATH ANGIO 5F BER2 65CM (CATHETERS) ×1 IMPLANT
CATH ANGIO 5F PIGTAIL 100CM (CATHETERS) ×1 IMPLANT
DEVICE VASC CLSR CELT ART 7 (Vascular Products) ×1 IMPLANT
GLIDEWIRE ADV .035X260CM (WIRE) ×1 IMPLANT
KIT ENCORE 26 ADVANTAGE (KITS) ×1 IMPLANT
KIT MICROPUNCTURE NIT STIFF (SHEATH) ×1 IMPLANT
KIT PV (KITS) ×4 IMPLANT
SHEATH DESTINATION 7FR 90 (SHEATH) ×1 IMPLANT
SHEATH PINNACLE 5F 10CM (SHEATH) ×1 IMPLANT
SHEATH PINNACLE 7F 10CM (SHEATH) ×1 IMPLANT
SHEATH PROBE COVER 6X72 (BAG) ×1 IMPLANT
STENT VIABAHNBX 8X29X135 (Permanent Stent) ×1 IMPLANT
SYR MEDRAD MARK 7 150ML (SYRINGE) ×4 IMPLANT
TRANSDUCER W/STOPCOCK (MISCELLANEOUS) ×4 IMPLANT
TRAY PV CATH (CUSTOM PROCEDURE TRAY) ×4 IMPLANT
WIRE BENTSON .035X145CM (WIRE) ×1 IMPLANT

## 2021-03-05 NOTE — Interval H&P Note (Signed)
History and Physical Interval Note:  03/05/2021 9:05 AM  [name] [name]  has presented today for surgery, with the diagnosis of aterial acculation.  The various methods of treatment have been discussed with the patient and family. After consideration of risks, benefits and other options for treatment, the patient has consented to  Procedure(s): UPPER EXTREMITY ANGIOGRAPHY (N/A) as a surgical intervention.  The patient's history has been reviewed, patient examined, no change in status, stable for surgery.  I have reviewed the patient's chart and labs.  Questions were answered to the patient's satisfaction.     Annamarie Major

## 2021-03-05 NOTE — Progress Notes (Signed)
[facility] [place]  Subjective  -right hand still feels better, some bleeding from right ear overnight.   Objective 113/75 (!) 56 97.7 F (36.5 C) (Oral) 19 96%  Intake/Output Summary (Last 24 hours) at 03/05/2021 0825 Last data filed at 03/05/2021 0750 Gross per 24 hour  Intake 752.58 ml  Output 1980 ml  Net -1227.42 ml    Right upper extremity incision clean dry intact Right radial pulse 1+ palpable Left femoral pulse palpable but no palpable right femoral pulse  Laboratory Lab Results: Recent Labs    03/04/21 0458 03/05/21 0335  WBC 12.0* 9.0  HGB 10.4* 10.2*  HCT 34.4* 33.4*  PLT 148* 144*   BMET Recent Labs    03/04/21 0717 03/05/21 0708  NA 139 140  K 3.9 4.0  CL 113* 113*  CO2 20* 20*  GLUCOSE 92 84  BUN 23 25*  CREATININE 1.48* 1.66*  CALCIUM 8.4* 8.2*    COAG Lab Results  Component Value Date   INR 1.0 01/11/2017   INR 1.02 01/10/2017   INR 1.04 11/15/2016   No results found for: PTT  Assessment/Planning:  66 year old male now postop day 3 status post right subclavian, axillary, brachial, radial and ulnar artery thrombectomy for acute limb ischemia.  Incision looks good and he still has a palpable radial pulse.  Hand is much better. CTA yesterday concerning for high-grade right subclavian stenosis distal to the vertebral artery and likely source for atheroembolic event.  I recommended arch aortogram with right upper extremity arteriogram today and likely stenting.  Risk benefits discussed.  Marty Heck 03/05/2021 8:25 AM --

## 2021-03-05 NOTE — Progress Notes (Addendum)
Pt groin checked found pt laying on his side and groin site bleeding and enlarged. Pressure held and cath lab called.

## 2021-03-05 NOTE — Progress Notes (Signed)
Bleeding controlled . Cath lab placed new dressing and groin is soft. Pt re educated on staying flat. Will continue to monitor   Heparin was paused and restarted once bleeding stopped . Unit pharmacy notified   Phoebe Sharps, RN

## 2021-03-05 NOTE — H&P (View-Only) (Signed)
[facility] [place]  Subjective  -right hand still feels better, some bleeding from right ear overnight.   Objective 113/75 (!) 56 97.7 F (36.5 C) (Oral) 19 96%  Intake/Output Summary (Last 24 hours) at 03/05/2021 0825 Last data filed at 03/05/2021 0750 Gross per 24 hour  Intake 752.58 ml  Output 1980 ml  Net -1227.42 ml    Right upper extremity incision clean dry intact Right radial pulse 1+ palpable Left femoral pulse palpable but no palpable right femoral pulse  Laboratory Lab Results: Recent Labs    03/04/21 0458 03/05/21 0335  WBC 12.0* 9.0  HGB 10.4* 10.2*  HCT 34.4* 33.4*  PLT 148* 144*   BMET Recent Labs    03/04/21 0717 03/05/21 0708  NA 139 140  K 3.9 4.0  CL 113* 113*  CO2 20* 20*  GLUCOSE 92 84  BUN 23 25*  CREATININE 1.48* 1.66*  CALCIUM 8.4* 8.2*    COAG Lab Results  Component Value Date   INR 1.0 01/11/2017   INR 1.02 01/10/2017   INR 1.04 11/15/2016   No results found for: PTT  Assessment/Planning:  66 year old male now postop day 3 status post right subclavian, axillary, brachial, radial and ulnar artery thrombectomy for acute limb ischemia.  Incision looks good and he still has a palpable radial pulse.  Hand is much better. CTA yesterday concerning for high-grade right subclavian stenosis distal to the vertebral artery and likely source for atheroembolic event.  I recommended arch aortogram with right upper extremity arteriogram today and likely stenting.  Risk benefits discussed.  Marty Heck 03/05/2021 8:25 AM --

## 2021-03-05 NOTE — Op Note (Addendum)
° ° °  Patient name: [name] [name] MRN: [id number] DOB: 1956-02-12 Sex: male  03/05/2021 Pre-operative Diagnosis: Right subclavian artery stenosis Post-operative diagnosis:  Same Surgeon:  Annamarie Major Procedure Performed:  1.  Ultrasound-guided access, left femoral artery  2.  Aortic arch angiogram  3.  Stent, right subclavian artery (VBX 8 x 29)  4.  Conscious sedation, 57 minutes  5.  Closure device, Celt    Indications: This is a 66 year old gentleman who recently underwent right arm embolectomy.  He is also found to have a significant stenosis via CT scan of the subclavian artery.  He comes in today to have this repaired.  Procedure:  The patient was identified in the holding area and taken to room 8.  The patient was then placed supine on the table and prepped and draped in the usual sterile fashion.  A time out was called.  Conscious sedation was administered with the use of IV fentanyl and Versed under continuous physician and nurse monitoring.  Heart rate, blood pressure, and oxygen saturation were continuously monitored.  Total sedation time was 57 minutes.  Ultrasound was used to evaluate the left common femoral artery.  It was patent .  A digital ultrasound image was acquired.  A micropuncture needle was used to access the left common femoral artery under ultrasound guidance.  An 018 wire was advanced without resistance and a micropuncture sheath was placed.  The 018 wire was removed and a benson wire was placed.  The micropuncture sheath was exchanged for a 5 french sheath.  A pigtail catheter was advanced into the ascending aorta and aortic arch angiogram was performed  Findings:   Aortic arch: A type I aortic arch is identified.  No significant ostial great vessel stenosis was identified.  There did appear to be a greater than 70% stenosis within the right subclavian artery   Intervention:  After the above images were acquired the.  Decision was made to proceed with intervention.   Using a Berenstein 2 catheter and a Glidewire advantage, I obtained wire access out into the brachial artery.  Next, a 7 French 90 cm sheath was advanced across the lesion in the subclavian artery.  Additional heparin bolus was given.  I then took additional images.  I selected an 8 x 29 VBX stent.  This was deployed across the lesion.  I took the balloon past burst pressure.  On completion imaging, the stent appeared to be well opacified to the arterial wall at the proximal and midportion of the artery.  There was no residual stenosis.  Therefore elected not to upsize the balloon.  At this point wires and catheters were removed.  A short 7 French sheath was placed in the groin and the groin was closed with a Celt device.  The patient had a palpable right radial pulse.  Heparin drip will continue.  Impression:  #1  Greater than 70% right subclavian artery stenosis successfully stented using a 8 x 29 VBX stent    V. Annamarie Major, M.D., [facility] [place] Office: [phone] Pager:  [phone]

## 2021-03-05 NOTE — Progress Notes (Signed)
ANTICOAGULATION CONSULT NOTE - Follow Up Consult  Pharmacy Consult for heparin Indication:  s/p thrombectomy  Labs: Recent Labs    03/02/21 1809 03/02/21 1809 03/02/21 1811 03/02/21 2305 03/03/21 0537 03/03/21 1443 03/03/21 2211 03/04/21 0458 03/04/21 0717 03/05/21 0335  HGB 13.1  --  13.6  --  10.9*  --   --  10.4*  --  10.2*  HCT 41.5  --  40.0  --  35.5*  --   --  34.4*  --  33.4*  PLT 229  --   --   --  151  --   --  148*  --  144*  HEPARINUNFRC  --    < >  --  >1.10* 0.72*   < > 0.32 0.41  --  >1.10*  CREATININE 1.73*  --  1.70*  --  1.71*  --   --   --  1.48*  --   TROPONINIHS 7  --   --  9  --   --   --   --   --   --    < > = values in this interval not displayed.    Assessment: 66yo male supratherapeutic on heparin after several levels at goal; no infusion issues or signs of bleeding per RN though she notes that pt reported some bloody drainage from ear.  Goal of Therapy:  Heparin level 0.3-0.7 units/ml   Plan:  Will hold heparin infusion x1h then decrease heparin infusion by 4 units/kg/hr to 950 units/hr and check level in 6-8 hours.    Wynona Neat, PharmD, BCPS  03/05/2021,4:32 AM

## 2021-03-05 NOTE — Progress Notes (Addendum)
ANTICOAGULATION CONSULT NOTE - Follow Up Consult  Pharmacy Consult for heparin Indication:  s/p thrombectomy  Labs: Recent Labs    03/02/21 1809 03/02/21 1811 03/02/21 2305 03/03/21 0537 03/03/21 1443 03/04/21 0458 03/04/21 0717 03/05/21 0335 03/05/21 0708 03/05/21 1309  HGB 13.1   < >  --  10.9*  --  10.4*  --  10.2*  --   --   HCT 41.5   < >  --  35.5*  --  34.4*  --  33.4*  --   --   PLT 229  --   --  151  --  148*  --  144*  --   --   HEPARINUNFRC  --    < > >1.10* 0.72*   < > 0.41  --  >1.10*  --  >1.10*  CREATININE 1.73*   < >  --  1.71*  --   --  1.48*  --  1.66*  --   TROPONINIHS 7  --  9  --   --   --   --   --   --   --    < > = values in this interval not displayed.     Assessment: 66yo male supratherapeutic on heparin after several levels at goal. Patient did have some bleeding from groin insertion site. Patient was reeducated on laying flat. Groin was described as soft. There is a new dressing in place. Controlled bleed. I was notified the heparin drip was paused until bleed stopped, then resumed. This occurred around 12:40 PM. Heparin infusion was restarted at 12:57 PM  Current heparin level drawn at 1:09 PM was >1.10 IU/mL  Goal of Therapy:  Heparin level 0.3-0.7 units/ml   Plan:  Will hold heparin infusion x1h then decrease heparin infusion by 4 units/kg/hr to 650 units/hr and check level in 6-8 hours.    Verbally communicated the plan to patients Nurse.   Vaughan Basta BS, PharmD, BCPS Clinical Pharmacist 03/05/2021,2:12 PM

## 2021-03-06 ENCOUNTER — Other Ambulatory Visit (HOSPITAL_COMMUNITY): Payer: Self-pay

## 2021-03-06 LAB — CBC
HCT: 32.6 % — ABNORMAL LOW (ref 39.0–52.0)
Hemoglobin: 9.9 g/dL — ABNORMAL LOW (ref 13.0–17.0)
MCH: 23.9 pg — ABNORMAL LOW (ref 26.0–34.0)
MCHC: 30.4 g/dL (ref 30.0–36.0)
MCV: 78.7 fL — ABNORMAL LOW (ref 80.0–100.0)
Platelets: 147 10*3/uL — ABNORMAL LOW (ref 150–400)
RBC: 4.14 MIL/uL — ABNORMAL LOW (ref 4.22–5.81)
RDW: 17.8 % — ABNORMAL HIGH (ref 11.5–15.5)
WBC: 6.9 10*3/uL (ref 4.0–10.5)
nRBC: 0 % (ref 0.0–0.2)

## 2021-03-06 LAB — HEPARIN LEVEL (UNFRACTIONATED): Heparin Unfractionated: 0.32 IU/mL (ref 0.30–0.70)

## 2021-03-06 MED ORDER — APIXABAN 5 MG PO TABS: 5.0000 mg | ORAL_TABLET | Freq: Two times a day (BID) | ORAL | 3 refills | Status: DC

## 2021-03-06 MED ORDER — ASPIRIN 81 MG PO TBEC: 81.0000 mg | DELAYED_RELEASE_TABLET | Freq: Every day | ORAL | 11 refills | Status: DC

## 2021-03-06 MED ORDER — HYDROCODONE-ACETAMINOPHEN 5-325 MG PO TABS: 1.0000 | ORAL_TABLET | Freq: Four times a day (QID) | ORAL | 0 refills | Status: DC | PRN

## 2021-03-06 MED ORDER — APIXABAN 5 MG PO TABS
10.0000 mg | ORAL_TABLET | Freq: Two times a day (BID) | ORAL | Status: DC
  Administered 2021-03-06: 10 mg via ORAL
  Filled 2021-03-06: qty 2

## 2021-03-06 MED ORDER — APIXABAN 5 MG PO TABS: 5.0000 mg | ORAL_TABLET | Freq: Two times a day (BID) | ORAL | Status: DC

## 2021-03-06 MED ORDER — APIXABAN 5 MG PO TABS: 10.0000 mg | ORAL_TABLET | Freq: Two times a day (BID) | ORAL | 0 refills | Status: DC

## 2021-03-06 NOTE — Care Management Important Message (Signed)
Important Message  Patient Details  Name: [name] [name] MRN: [id number] Date of Birth: 1956-02-28   Medicare Important Message Given:  Yes     Shelda Altes 03/06/2021, 11:06 AM

## 2021-03-06 NOTE — Discharge Instructions (Addendum)
Information on my medicine - ELIQUIS (apixaban)  This medication education was reviewed with me or my healthcare representative as part of my discharge preparation.   Why was Eliquis prescribed for you? Eliquis was prescribed to treat blood clots that may have been found in the veins of your legs (deep vein thrombosis) or in your lungs (pulmonary embolism) and to reduce the risk of them occurring again.  What do You need to know about Eliquis ? The starting dose is 10 mg (two 5 mg tablets) taken TWICE daily for the FIRST SEVEN (7) DAYS, then on 03/13/2021  the dose is reduced to ONE 5 mg tablet taken TWICE daily.  Eliquis may be taken with or without food.   Try to take the dose about the same time in the morning and in the evening. If you have difficulty swallowing the tablet whole please discuss with your pharmacist how to take the medication safely.  Take Eliquis exactly as prescribed and DO NOT stop taking Eliquis without talking to the doctor who prescribed the medication.  Stopping may increase your risk of developing a new blood clot.  Refill your prescription before you run out.  After discharge, you should have regular check-up appointments with your healthcare provider that is prescribing your Eliquis.    What do you do if you miss a dose? If a dose of ELIQUIS is not taken at the scheduled time, take it as soon as possible on the same day and twice-daily administration should be resumed. The dose should not be doubled to make up for a missed dose.  Important Safety Information A possible side effect of Eliquis is bleeding. You should call your healthcare provider right away if you experience any of the following: Bleeding from an injury or your nose that does not stop. Unusual colored urine (red or dark brown) or unusual colored stools (red or black). Unusual bruising for unknown reasons. A serious fall or if you hit your head (even if there is no bleeding).  Some  medicines may interact with Eliquis and might increase your risk of bleeding or clotting while on Eliquis. To help avoid this, consult your healthcare provider or pharmacist prior to using any new prescription or non-prescription medications, including herbals, vitamins, non-steroidal anti-inflammatory drugs (NSAIDs) and supplements.  This website has more information on Eliquis (apixaban): [email or web address].com/[email or web address]   [facility]  Discharge Instructions  Upper Extremity Angiogram; Angioplasty/Stenting  Please refer to the following instructions for your post-procedure care. Your surgeon or physician assistant will discuss any changes with you.  Activity  Avoid lifting more than 8 pounds (1 gallons of milk) for 5 days after your procedure. You may walk as much as you can tolerate. It's OK to drive after 72 hours.  Bathing/Showering  You may shower the day after your procedure. If you have a bandage, you may remove it at 24- 48 hours. Clean your incision site with mild soap and water. Pat the area dry with a clean towel.  Diet  Resume your pre-procedure diet. There are no special food restrictions following this procedure. All patients with peripheral vascular disease should follow a low fat/low cholesterol diet. In order to heal from your surgery, it is CRITICAL to get adequate nutrition. Your body requires vitamins, minerals, and protein. Vegetables are the best source of vitamins and minerals. Vegetables also provide the perfect balance of protein. Processed food has little nutritional value, so try to avoid this.  Medications  Resume taking all  of your medications unless your doctor tells you not to. If your incision is causing pain, you may take over-the-counter pain relievers such as acetaminophen (Tylenol)  Follow Up  Follow up will be arranged at the time of your procedure. You may have an office visit scheduled or may be scheduled for  surgery. Ask your surgeon if you have any questions.  Please call us immediately for any of the following conditions: Severe or worsening pain your legs or feet at rest or with walking. Increased pain, redness, drainage at your groin puncture site. Fever of 101 degrees or higher. If you have any mild or slow bleeding from your puncture site: lie down, apply firm constant pressure over the area with a piece of gauze or a clean wash cloth for 30 minutes- no peeking!, call 911 right away if you are still bleeding after 30 minutes, or if the bleeding is heavy and unmanageable.  Reduce your risk factors of vascular disease:  Stop smoking. If you would like help call [facility] at 1-800-QUIT-NOW (602)411-6922) or [place] at 801-833-8161. Manage your cholesterol Maintain a desired weight Control your diabetes Keep your blood pressure down  If you have any questions, please call the office at [phone]

## 2021-03-06 NOTE — Progress Notes (Signed)
Pt discharging home with family.  All instructions given and reviewed, all questions answered.

## 2021-03-06 NOTE — Consult Note (Signed)
° °  [facility] CM Inpatient Consult   03/06/2021  KENTAVIOUS MICHELE 10/17/1955 [id number]  [facility] [ACO] Patient: [employer]  Patient evaluated for community based chronic disease management services with [place] [facility] as a benefit of patient's [employer]. Spoke with patient at bedside to explain [facility] services.  Patient will receive post hospital discharge call and for assessments fo community needs for care/disease management.  Patient was given a reminder card for follow up with PCP office and a 24 hour nurse advise line.  He agrees to post hospital follow up telephonic RN with [facility] CM.   Plan: Will notify Inpatient Transition of Care [TOC] Case Manager aware that [place] Management following. Assign patient for follow up.  Of note, [facility] Care Management services does not replace or interfere with any services that are arranged by inpatient Transition of Care [TOC] team     For additional questions or referrals please contact:    Natividad Brood, RN BSN [facility] Liaison  [phone] business mobile phone Toll free office [phone]  Fax number: [phone] [email or web address]  [email or web address]

## 2021-03-06 NOTE — Progress Notes (Signed)
[facility] [place]  Subjective  -no complaints.  Right hand feels much better.   Objective 126/76 88 98 F (36.7 C) (Oral) 17 100%  Intake/Output Summary (Last 24 hours) at 03/06/2021 1014 Last data filed at 03/06/2021 0600 Gross per 24 hour  Intake 203.65 ml  Output 1425 ml  Net -1221.35 ml    Right brachial and radial pulse palpable Right brachial incision clean dry and intact Right hand warm Left groin from transfemoral access clean and dry  Laboratory Lab Results: Recent Labs    03/05/21 0335 03/06/21 0411  WBC 9.0 6.9  HGB 10.2* 9.9*  HCT 33.4* 32.6*  PLT 144* 147*   BMET Recent Labs    03/04/21 0717 03/05/21 0708  NA 139 140  K 3.9 4.0  CL 113* 113*  CO2 20* 20*  GLUCOSE 92 84  BUN 23 25*  CREATININE 1.48* 1.66*  CALCIUM 8.4* 8.2*    COAG Lab Results  Component Value Date   INR 1.0 01/11/2017   INR 1.02 01/10/2017   INR 1.04 11/15/2016   No results found for: PTT  Assessment/Planning:  66 year old male presented Monday with acute right upper extremity ischemia and underwent right subclavian, axillary, brachial, radial and ulnar artery thrombectomy.  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  Underwent stenting of the lesion yesterday and has excellent palpable radial pulse.  Plan is discharge today on DOAC plus aspirin.  I have discussed with pharmacy and they are going to likely transition him to Eliquis and aspirin.  I will see him in 1 month with right arm arterial duplex.  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  I have discussed these findings with the patient and states he has knowledge of this already and is scheduled to see pulmonology later this month.  I have looked in the chart and see that he does have an appointment with pulmonology as well.  Marty Heck 03/06/2021 10:14 AM --

## 2021-03-06 NOTE — TOC Benefit Eligibility Note (Signed)
Patient Teacher, English as a foreign language completed.    The patient is currently admitted and upon discharge could be taking Xarelto 20 mg.  The current 30 day co-pay is, $10.35.   The patient is currently admitted and upon discharge could be taking Eliquis 5 mg.  The current 30 day co-pay is, $10.35.   The patient is insured through [place] Patient Advocate Specialist [place] Patient Advocate Team Direct Number: [phone]  Fax: [phone]

## 2021-03-06 NOTE — Progress Notes (Signed)
ANTICOAGULATION CONSULT NOTE - Follow Up Consult  Pharmacy Consult for heparin Indication:  s/p thrombectomy  Labs: Recent Labs    03/03/21 0537 03/03/21 1443 03/04/21 0458 03/04/21 0717 03/05/21 0335 03/05/21 0708 03/05/21 1309 03/05/21 2217  HGB 10.9*  --  10.4*  --  10.2*  --   --   --   HCT 35.5*  --  34.4*  --  33.4*  --   --   --   PLT 151  --  148*  --  144*  --   --   --   HEPARINUNFRC 0.72*   < > 0.41  --  >1.10*  --  >1.10* 0.52  CREATININE 1.71*  --   --  1.48*  --  1.66*  --   --    < > = values in this interval not displayed.     Assessment/Plan 66yo male therapeutic on heparin after rate changes. Will continue infusion at current rate of 650 units/hr and confirm stable with am labs.   Wynona Neat, PharmD, BCPS  03/06/2021,12:00 AM

## 2021-03-08 NOTE — Discharge Summary (Addendum)
Discharge Summary  Patient ID: [name] [name] [id number] 65 y.o. 1955-11-22  Admit date: 03/02/2021  Discharge date and time: 03/06/2021  4:01 PM   Admitting Physician: Marty Heck, MD   Discharge Physician: same  Admission Diagnoses: Arterial occlusion [I70.90] Status post surgery [Z98.890] Ischemia of extremity [I99.8]  Discharge Diagnoses: same  Admission Condition: fair  Discharged Condition: good  Indication for Admission: Acute right arm ischemia  Hospital Course: [name] [name] is a 66 year old male who presented emergently to [facility] long ED with acute right arm ischemia.  He was transferred to [facility] and underwent right upper extremity thrombectomy by Dr. Carlis Abbott on 03/02/2021.  He tolerated the procedure well and was admitted overnight.  He underwent CTA chest as part of an embolic work-up.  This demonstrated a high-grade stenosis of his subclavian artery which was likely the etiology of right arm ischemia.  On 03/05/2021 he underwent angiography and stenting of right subclavian artery by Dr. Trula Slade.  He maintained a palpable radial pulse throughout his hospital stay after thrombectomy.  He was prescribed 1 to 2 days of narcotic pain medication for continued postoperative pain control.  He was discharged on aspirin and Eliquis.  He will follow-up in office in 1 month with right arm arterial duplex.  He was discharged home in stable condition.  Consults: None  Treatments: surgery: Right upper extremity thrombectomy by Dr. Carlis Abbott on 03/02/2021 Aortic arch angiogram and right arm runoff with stenting of right subclavian artery by Dr. Trula Slade on 03/05/2021  Discharge Exam: See progress note 03/06/21 Vitals:   03/06/21 0903 03/06/21 1224  BP:  (!) 109/97  Pulse:  82  Resp:  16  Temp:  97.8 F (36.6 C)  SpO2: 100% 100%     Disposition: Discharge disposition: 01-Home or Self Care       Patient Instructions:  Allergies as of 03/06/2021       Reactions   Codeine  Nausea Only        Medication List     STOP taking these medications    Proctozone-HC 2.5 % rectal cream Generic drug: hydrocortisone       TAKE these medications    albuterol 108 (90 Base) MCG/ACT inhaler Commonly known as: VENTOLIN HFA INHALE 2 PUFFS INTO THE LUNGS EVERY 4 (FOUR) HOURS AS NEEDED FOR WHEEZING OR SHORTNESS OF BREATH (COUGH, SHORTNESS OF BREATH OR WHEEZING.). What changed:  how much to take when to take this reasons to take this   amLODipine 10 MG tablet Commonly known as: NORVASC TAKE 1 TABLET (10 MG TOTAL) BY MOUTH DAILY. What changed: how much to take   apixaban 5 MG Tabs tablet Commonly known as: ELIQUIS Take 2 tablets (10 mg total) by mouth 2 (two) times daily.   apixaban 5 MG Tabs tablet Commonly known as: ELIQUIS Take 1 tablet (5 mg total) by mouth 2 (two) times daily. Start taking on: March 13, 2021   aspirin 81 MG EC tablet Take 1 tablet (81 mg total) by mouth daily. Swallow whole.   atorvastatin 40 MG tablet Commonly known as: LIPITOR TAKE 1 TABLET (40 MG TOTAL) BY MOUTH DAILY. What changed: how much to take   docusate sodium 100 MG capsule Commonly known as: COLACE Take 100 mg by mouth daily as needed for mild constipation.   fluticasone 27.5 MCG/SPRAY nasal spray Commonly known as: VERAMYST Place 2 sprays into the nose daily as needed for rhinitis.   hydrALAZINE 25 MG tablet Commonly known as: APRESOLINE TAKE  2 TABLETS (50 MG TOTAL) BY MOUTH EVERY 8 (EIGHT) HOURS. What changed:  how much to take how to take this when to take this   HYDROcodone-acetaminophen 5-325 MG tablet Commonly known as: NORCO/VICODIN Take 1-2 tablets by mouth every 6 (six) hours as needed for moderate pain. What changed:  how much to take when to take this reasons to take this   hydrocortisone cream 1 % Apply 1 application topically 2 (two) times daily. What changed:  when to take this reasons to take this   isosorbide mononitrate 30 MG  24 hr tablet Commonly known as: IMDUR TAKE 1 TABLET (30 MG TOTAL) BY MOUTH DAILY. What changed:  how much to take when to take this   levothyroxine 112 MCG tablet Commonly known as: Synthroid Take 1 tablet (112 mcg total) by mouth daily.   metoprolol tartrate 25 MG tablet Commonly known as: LOPRESSOR TAKE 1 TABLET (25 MG TOTAL) BY MOUTH 2 (TWO) TIMES DAILY. What changed: how much to take   mirtazapine 15 MG disintegrating tablet Commonly known as: REMERON SOL-TAB Take 1 tablet (15 mg total) by mouth at bedtime.   Symbicort 80-4.5 MCG/ACT inhaler Generic drug: budesonide-formoterol INHALE 2 PUFFS INTO THE LUNGS 2 (TWO) TIMES DAILY.   tamsulosin 0.4 MG Caps capsule Commonly known as: FLOMAX Take 1 capsule (0.4 mg total) by mouth daily.   Vitamin D (Ergocalciferol) 1.25 MG (50000 UNIT) Caps capsule Commonly known as: DRISDOL TAKE 1 CAPSULE (50,000 UNITS TOTAL) BY MOUTH EVERY 7 (SEVEN) DAYS. What changed: how much to take       Activity: activity as tolerated Diet: regular diet Wound Care: keep wound clean and dry  Follow-up with [facility] in 1 month.  Signed: Dagoberto Ligas, PA-C 03/08/2021 9:11 AM [facility] Office: [phone]

## 2021-03-09 ENCOUNTER — Encounter (HOSPITAL_COMMUNITY): Payer: Self-pay | Admitting: Surgery

## 2021-03-09 NOTE — Patient Outreach (Signed)
[place] [facility]) Care Management  03/09/2021  KEYONTA BARRADAS 1955-06-19 [id number]   Received hospital referral for [place] for complex care and disease management from [facility]. Assigned to Joellyn Quails, RN Care Coordinator.   Thank you, [place] Care Management Assistant

## 2021-03-11 ENCOUNTER — Other Ambulatory Visit: Payer: Self-pay | Admitting: *Deleted

## 2021-03-11 NOTE — Patient Outreach (Signed)
[place] [facility]) Care Management  03/11/2021  [name] [name] 1955-09-14 [id number]   [facility] Unsuccessful outreach   Outreach attempt to the listed at the preferred outreach number in [place] No answer. [facility] RN CM left HIPAA [facility] Portability and Accountability Act) compliant voicemail message along with CMs contact info.   Plan: [place] RN CM scheduled this patient for another call attempt within 4-7 business days Unsuccessful outreach letter sent on 03/11/21 Unsuccessful outreach on 03/11/21   Joelene Millin L. Lavina Hamman, RN, BSN, [place] Coordinator Office number [phone] Mobile number [phone]  Main [facility] number [phone] Fax number [phone]

## 2021-03-13 ENCOUNTER — Telehealth: Payer: Self-pay | Admitting: *Deleted

## 2021-03-13 ENCOUNTER — Ambulatory Visit (INDEPENDENT_AMBULATORY_CARE_PROVIDER_SITE_OTHER): Payer: Medicare Other | Admitting: Physician Assistant

## 2021-03-13 ENCOUNTER — Other Ambulatory Visit: Payer: Self-pay

## 2021-03-13 ENCOUNTER — Ambulatory Visit: Payer: Self-pay

## 2021-03-13 ENCOUNTER — Ambulatory Visit (HOSPITAL_COMMUNITY)
Admission: RE | Admit: 2021-03-13 | Discharge: 2021-03-13 | Disposition: A | Discharge status: Home / Self Care | Payer: Medicare Other | Source: Ambulatory Visit | Attending: Physician Assistant | Admitting: Physician Assistant

## 2021-03-13 ENCOUNTER — Encounter: Payer: Self-pay | Admitting: Physician Assistant

## 2021-03-13 DIAGNOSIS — I729 Aneurysm of unspecified site: Secondary | ICD-10-CM | POA: Diagnosis not present

## 2021-03-13 DIAGNOSIS — T81718A Complication of other artery following a procedure, not elsewhere classified, initial encounter: Secondary | ICD-10-CM

## 2021-03-13 NOTE — Telephone Encounter (Signed)
°  Chief Complaint: red area to post op stent site Symptoms: bleeding under the skin at femoral stent site Frequency: several days Pertinent Negatives: NA Disposition: [] ED /[] Urgent Care (no appt availability in office) / [] Appointment(In office/virtual)/ []  [place] [facility]/ [] Home Care/ [] Refused Recommended Disposition /[] [place] Mobile Bus/ [x]  Follow-up with PCP Additional Notes: Pt was advised he needs to call [facility] to make an appt. Pt was provided with the number.    Reason for Disposition  [1] Small red area or streak AND [2] no fever  Answer Assessment - Initial Assessment Questions 1. SYMPTOM: "What's the main symptom you're concerned about?" (e.g., drainage, incision opening up, pain, redness)   Stent area looks like bleeding under skin 2. ONSET: "When did sx  start?"     Couple days ago 3. SURGERY: "What surgery did you have?"     thrombectomy 4. DATE of SURGERY: "When was the surgery?"      03/06/21 5. INCISION SITE: "Where is the incision located?"      RLE 6. REDNESS: "Is there any redness at the incision site?" If yes, ask: "How wide across is the redness?" (Inches, centimeters)      yes 7. PAIN: "Is there any pain?" If Yes, ask: "How bad is it?"  (Scale 1-10; or mild, moderate, severe)   - NONE (0): no pain   - MILD (1-3): doesn't interfere with normal activities    - MODERATE (4-7): interferes with normal activities or awakens from sleep    - SEVERE (8-10): excruciating pain, unable to do any normal activities     No 8. BLEEDING: "Is there any bleeding?" If Yes, ask: "How much?" and "Where?"     Looks like under the skin 9. DRAINAGE: "Is there any drainage from the incision site?" If yes, ask: "What color and how much?" (e.g., red, cloudy, pus; drops, teaspoon)     No 10. FEVER: "Do you have a fever?" If Yes, ask: "What is your temperature, how was it measured, and when did it start?"       No 11. OTHER SYMPTOMS: "Do you have any  other symptoms?" (e.g., dizziness, rash elsewhere on body, shaking chills, weakness)       No  Protocols used: Post-Op Incision Symptoms and Questions-A-AH

## 2021-03-13 NOTE — Progress Notes (Signed)
POST OPERATIVE OFFICE NOTE    CC:  F/u for surgery  HPI:  This is a 66 y.o. male who is s/p right upper extremity thrombectomy by Dr.Clark on 03/02/21. He tolerated the procedure well and was admitted overnight.  He underwent CTA chest as part of his embolic work-up.  This demonstrated a high-grade stenosis of his subclavian artery which was likely the etiology of right arm ischemia. On 03/05/2021 he underwent angiography and stenting of right subclavian artery by Dr. Trula Slade. He maintained a palpable radial pulse throughout his hospital stay after thrombectomy. He did have groin bleeding and swelling post stenting. Pressure was held and pressure hemostasis was obtained. He was discharged home without any further bleeding  He presents today with concerns of redness in left groin that he noticed this morning. He has left the dressings placed on his groin from last week. Says he noticed some bruising and it has been very sore so he was concerned he was bleeding on the inside. He is on Eliquis and Aspirin  Allergies  Allergen Reactions   Codeine Nausea Only    Current Outpatient Medications  Medication Sig Dispense Refill   albuterol (VENTOLIN HFA) 108 (90 Base) MCG/ACT inhaler INHALE 2 PUFFS INTO THE LUNGS EVERY 4 (FOUR) HOURS AS NEEDED FOR WHEEZING OR SHORTNESS OF BREATH (COUGH, SHORTNESS OF BREATH OR WHEEZING.). (Patient taking differently: 2 puffs every 6 (six) hours as needed for shortness of breath.) 18 g 11   amLODipine (NORVASC) 10 MG tablet TAKE 1 TABLET (10 MG TOTAL) BY MOUTH DAILY. (Patient taking differently: Take 10 mg by mouth daily.) 90 tablet 3   apixaban (ELIQUIS) 5 MG TABS tablet Take 2 tablets (10 mg total) by mouth 2 (two) times daily. 60 tablet 0   apixaban (ELIQUIS) 5 MG TABS tablet Take 1 tablet (5 mg total) by mouth 2 (two) times daily. 60 tablet 3   aspirin EC 81 MG EC tablet Take 1 tablet (81 mg total) by mouth daily. Swallow whole. 30 tablet 11   atorvastatin (LIPITOR) 40  MG tablet TAKE 1 TABLET (40 MG TOTAL) BY MOUTH DAILY. (Patient taking differently: Take 40 mg by mouth daily.) 90 tablet 3   budesonide-formoterol (SYMBICORT) 80-4.5 MCG/ACT inhaler INHALE 2 PUFFS INTO THE LUNGS 2 (TWO) TIMES DAILY. 10.2 g 11   docusate sodium (COLACE) 100 MG capsule Take 100 mg by mouth daily as needed for mild constipation.     fluticasone (VERAMYST) 27.5 MCG/SPRAY nasal spray Place 2 sprays into the nose daily as needed for rhinitis.     hydrALAZINE (APRESOLINE) 25 MG tablet TAKE 2 TABLETS (50 MG TOTAL) BY MOUTH EVERY 8 (EIGHT) HOURS. (Patient taking differently: Take 50 mg by mouth every 8 (eight) hours.) 180 tablet 3   HYDROcodone-acetaminophen (NORCO/VICODIN) 5-325 MG tablet Take 1-2 tablets by mouth every 6 (six) hours as needed for moderate pain. 12 tablet 0   hydrocortisone cream 1 % Apply 1 application topically 2 (two) times daily. (Patient taking differently: Apply 1 application topically daily as needed (Dry sckin per patient).) 30 g 6   isosorbide mononitrate (IMDUR) 30 MG 24 hr tablet TAKE 1 TABLET (30 MG TOTAL) BY MOUTH DAILY. (Patient taking differently: Take 30 mg by mouth daily.) 90 tablet 3   levothyroxine (SYNTHROID) 112 MCG tablet Take 1 tablet (112 mcg total) by mouth daily. 90 tablet 3   metoprolol tartrate (LOPRESSOR) 25 MG tablet TAKE 1 TABLET (25 MG TOTAL) BY MOUTH 2 (TWO) TIMES DAILY. (Patient taking differently: Take  25 mg by mouth 2 (two) times daily.) 180 tablet 3   mirtazapine (REMERON SOL-TAB) 15 MG disintegrating tablet Take 1 tablet (15 mg total) by mouth at bedtime. 90 tablet 3   tamsulosin (FLOMAX) 0.4 MG CAPS capsule Take 1 capsule (0.4 mg total) by mouth daily. 90 capsule 3   Vitamin D, Ergocalciferol, (DRISDOL) 1.25 MG (50000 UNIT) CAPS capsule TAKE 1 CAPSULE (50,000 UNITS TOTAL) BY MOUTH EVERY 7 (SEVEN) DAYS. (Patient taking differently: Take 50,000 Units by mouth every 7 (seven) days.) 5 capsule 6   No current facility-administered medications  for this visit.     ROS:  See HPI  Physical Exam:  General: well appearing, well nourished, in no distress Incision:  right upper extremity brachial artery incision intact, small area of fullness. Suspect this is small seroma. No signs of infection. No drainage. Right upper extremity with ecchymosis throughout Extremities:  left groin with ecchymosis. 2+ femoral pulse. Slight pulsatility present and tender to palpation. Palpable Dp pulse bilaterally Neuro: alert and oriented Abdomen:  flat, soft, non distended  Non invasive vascular lab: +-----------+----------+--------+------+---------------------+   Left Duplex PSV (cm/s) Waveform Plaque      Comment(s)         +-----------+----------+--------+------+---------------------+   CFA            142     biphasic                                +-----------+----------+--------+------+---------------------+   PFA            110     biphasic                                +-----------+----------+--------+------+---------------------+   Prox SFA        99     biphasic        atherosclerosis noted   +-----------+----------+--------+------+---------------------+   Left Vein comments: Left CFV, proximal femoral and profunda femoral, and SF junction are easily compressible, without intraluminal thrombus, with spontaneous flow. Slightly enlarged lymph node anterior to the ostial SFA, measuring 1.94 cm x 0.673 cm.     Summary: No evidence of left groin pseudoaneurysm, AV fistula, or DVT. Enlarged lymph node noted.   Assessment/Plan:  This is a 66 y.o. male who is s/p right upper extremity thrombectomy by Dr.Clark on 03/02/21.He tolerated the procedure well and was admitted overnight.  He underwent CTA chest as part of his embolic work-up.  This demonstrated a high-grade stenosis of his subclavian artery which was likely the etiology of right arm ischemia. On 03/05/2021 he underwent angiography and stenting of right subclavian artery by Dr. Trula Slade. His  right upper extremity remains well perfused and warm with palpable radial and brachial pulses. Incision is healing well. Small seroma present. Left femoral access site with ecchymosis and hematoma. Had this evaluated for possible pseudoaneurysm. Large lymph node identified otherwise no pseudoaneurysm or fluid collection. Reassurance provided to patient. Gave groin and right arm incision instructions for cleaning daily.  - continue Aspirin, statin, Eliquis - he will keep his follow up appointment on February 7th with Dr. Carlis Abbott - he will call for earlier follow up if he has any new or concerning symptoms   Karoline Caldwell, PA-C [facility] [phone]  Clinic MD:  Virl Cagey

## 2021-03-13 NOTE — Telephone Encounter (Signed)
Patient called and states he has red area in left groin that spreads upward to bottom of his stomach. He recently underwent a procedure done by Dr Trula Slade . Patient denies any fever,swelling, or drainage at the site. Spoke with Corie PA she will see patient today at 130 to check site. Spoke with patients daughter [name] [name] she will bring him to his appt. Placed patient on PA schedule for 1:30 today.

## 2021-03-18 ENCOUNTER — Other Ambulatory Visit: Payer: Self-pay | Admitting: *Deleted

## 2021-03-18 NOTE — Patient Outreach (Signed)
[facility]) Care Management  03/18/2021  [name] [name] 12-08-1955 [id number]   [facility] Unsuccessful outreach #2     Outreach attempt to the listed at the preferred outreach number in [place] No answer. [facility] RN CM left HIPAA [facility] Portability and Accountability Act) compliant voicemail message along with CMs contact info.  His name is correctly stated in the outgoing voice message This is the only number listed for the patient  Plan: [facility] RN CM scheduled this patient for another call attempt within 4-7+ business days Unsuccessful outreach letter sent on 03/11/21 Unsuccessful outreach on 03/11/21, 03/18/21     Joelene Millin L. Lavina Hamman, RN, BSN, [place] Coordinator Office number [phone] Mobile number [phone]  Main [facility] number [phone] Fax number [phone]

## 2021-03-23 ENCOUNTER — Other Ambulatory Visit: Payer: Self-pay | Admitting: *Deleted

## 2021-03-23 NOTE — Patient Outreach (Signed)
[name]) Care Management  03/23/2021  [name] [name] 09/04/1955 [id number]   [facility] Unsuccessful outreach #3     Outreach attempt to the listed at the preferred outreach number in [place] No answer. [facility] RN CM left HIPAA [facility] Portability and Accountability Act) compliant voicemail message along with CMs contact info.  His name is correctly stated in the outgoing voice message This is the only number listed for the patient   Plan: [facility] RN CM scheduled this patient for pending case closire Unsuccessful outreach letter sent on 03/11/21 Unsuccessful outreach on 03/11/21, 03/18/21, 03/23/21     Joelene Millin L. Lavina Hamman, RN, BSN, [place] Coordinator Office number [phone] Mobile number [phone]  Main [facility] number [phone] Fax number [phone]

## 2021-03-25 ENCOUNTER — Ambulatory Visit (INDEPENDENT_AMBULATORY_CARE_PROVIDER_SITE_OTHER): Payer: Medicare Other | Admitting: Acute Care

## 2021-03-25 ENCOUNTER — Encounter: Payer: Self-pay | Admitting: Acute Care

## 2021-03-25 ENCOUNTER — Other Ambulatory Visit: Payer: Self-pay

## 2021-03-25 ENCOUNTER — Ambulatory Visit (INDEPENDENT_AMBULATORY_CARE_PROVIDER_SITE_OTHER)
Admission: RE | Admit: 2021-03-25 | Discharge: 2021-03-25 | Disposition: A | Discharge status: Home / Self Care | Payer: Medicare Other | Source: Ambulatory Visit | Attending: Cardiovascular Disease | Admitting: Cardiovascular Disease

## 2021-03-25 DIAGNOSIS — F17210 Nicotine dependence, cigarettes, uncomplicated: Secondary | ICD-10-CM

## 2021-03-25 DIAGNOSIS — Z87891 Personal history of nicotine dependence: Secondary | ICD-10-CM

## 2021-03-25 NOTE — Patient Instructions (Signed)
Thank you for participating in the [place] Lung Cancer Screening Program. °It was our pleasure to meet you today. °We will call you with the results of your scan within the next few days. °Your scan will be assigned a Lung RADS category score by the physicians reading the scans.  °This Lung RADS score determines follow up scanning.  °See below for description of categories, and follow up screening recommendations. °We will be in touch to schedule your follow up screening annually or based on recommendations of our providers. °We will fax a copy of your scan results to your Primary Care Physician, or the physician who referred you to the program, to ensure they have the results. °Please call the office if you have any questions or concerns regarding your scanning experience or results.  °Our office number is [phone]. °Please speak with Denise Phelps, RN. She is our Lung Cancer Screening RN. °If she is unavailable when you call, please have the office staff send her a message. She will return your call at her earliest convenience. °Remember, if your scan is normal, we will scan you annually as long as you continue to meet the criteria for the program. (Age 55-77, Current smoker or smoker who has quit within the last 15 years). °If you are a smoker, remember, quitting is the single most powerful action that you can take to decrease your risk of lung cancer and other pulmonary, breathing related problems. °We know quitting is hard, and we are here to help.  °Please let us know if there is anything we can do to help you meet your goal of quitting. °If you are a former smoker, congratulations. We are proud of you! Remain smoke free! °Remember you can refer friends or family members through the number above.  °We will screen them to make sure they meet criteria for the program. °Thank you for helping us take better care of you by participating in Lung Screening. ° °You can receive free nicotine replacement therapy  ( patches, gum or mints) by calling 1-800-QUIT NOW. Please call so we can get you on the path to becoming  a non-smoker. I know it is hard, but you can do this! ° °Lung RADS Categories: ° °Lung RADS 1: no nodules or definitely non-concerning nodules.  °Recommendation is for a repeat annual scan in 12 months. ° °Lung RADS 2:  nodules that are non-concerning in appearance and behavior with a very low likelihood of becoming an active cancer. °Recommendation is for a repeat annual scan in 12 months. ° °Lung RADS 3: nodules that are probably non-concerning , includes nodules with a low likelihood of becoming an active cancer.  Recommendation is for a 6-month repeat screening scan. Often noted after an upper respiratory illness. We will be in touch to make sure you have no questions, and to schedule your 6-month scan. ° °Lung RADS 4 A: nodules with concerning findings, recommendation is most often for a follow up scan in 3 months or additional testing based on our provider's assessment of the scan. We will be in touch to make sure you have no questions and to schedule the recommended 3 month follow up scan. ° °Lung RADS 4 B:  indicates findings that are concerning. We will be in touch with you to schedule additional diagnostic testing based on our provider's  assessment of the scan. ° °Hypnosis for smoking cessation  °[employer] °[phone] ° °Acupuncture for smoking cessation  °[facility] °[phone]  °

## 2021-03-25 NOTE — Progress Notes (Signed)
Virtual Visit via Telephone Note  I connected with [name] [name] on 03/25/21 at 12:00 PM EST by telephone and verified that I am speaking with the correct person using two identifiers.  Location: Patient:  At home Provider:  [address], [place], [place], [address]    I discussed the limitations, risks, security and privacy concerns of performing an evaluation and management service by telephone and the availability of in person appointments. I also discussed with the patient that there may be a patient responsible charge related to this service. The patient expressed understanding and agreed to proceed.   Shared Decision Making Visit Lung Cancer Screening Program [phone])   Eligibility: Age 66 y.o. Pack Years Smoking History Calculation  82.5 pack year smoking history (# packs/per year x # years smoked) Recent History of coughing up blood  no Unexplained weight loss? no ( >Than 15 pounds within the last 6 months ) Prior History Lung / other cancer no (Diagnosis within the last 5 years already requiring surveillance chest CT Scans). Smoking Status Current Smoker Former Smokers: Years since quit:  NA  Quit Date:  NA  Visit Components: Discussion included one or more decision making aids. yes Discussion included risk/benefits of screening. yes Discussion included potential follow up diagnostic testing for abnormal scans. yes Discussion included meaning and risk of over diagnosis. yes Discussion included meaning and risk of False Positives. yes Discussion included meaning of total radiation exposure. yes  Counseling Included: Importance of adherence to annual lung cancer LDCT screening. yes Impact of comorbidities on ability to participate in the program. yes Ability and willingness to under diagnostic treatment. yes  Smoking Cessation Counseling: Current Smokers:  Discussed importance of smoking cessation. yes Information about tobacco cessation classes and  interventions provided to patient. yes Patient provided with "ticket" for LDCT Scan. yes Symptomatic Patient. no  Counseling NA Diagnosis Code: Tobacco Use Z72.0 Asymptomatic Patient yes  Counseling (Intermediate counseling: > three minutes counseling) U0454 Former Smokers:  Discussed the importance of maintaining cigarette abstinence. yes Diagnosis Code: Personal History of Nicotine Dependence. U98.119 Information about tobacco cessation classes and interventions provided to patient. Yes Patient provided with "ticket" for LDCT Scan. yes Written Order for Lung Cancer Screening with LDCT placed in Epic. Yes (CT Chest Lung Cancer Screening Low Dose W/O CM) [id number] Z12.2-Screening of respiratory organs Z87.891-Personal history of nicotine dependence  I have spent 25 minutes of face to face/ virtual visit   time with  [name] [name] discussing the risks and benefits of lung cancer screening. We viewed / discussed a power point together that explained in detail the above noted topics. We paused at intervals to allow for questions to be asked and answered to ensure understanding.We discussed that the single most powerful action that he can take to decrease his risk of developing lung cancer is to quit smoking. We discussed whether or not he is ready to commit to setting a quit date. We discussed options for tools to aid in quitting smoking including nicotine replacement therapy, non-nicotine medications, support groups, Quit Smart classes, and behavior modification. We discussed that often times setting smaller, more achievable goals, such as eliminating 1 cigarette a day for a week and then 2 cigarettes a day for a week can be helpful in slowly decreasing the number of cigarettes smoked. This allows for a sense of accomplishment as well as providing a clinical benefit. I provided  him  with smoking cessation  information  with contact information for community resources,  classes, free nicotine replacement  therapy, and access to mobile apps, text messaging, and on-line smoking cessation help. I have also provided  him  the office contact information in the event he needs to contact me, or the screening staff. We discussed the time and location of the scan, and that either Doroteo Glassman RN, Joella Prince, RN  or I will call / send a letter with the results within 24-72 hours of receiving them. The patient verbalized understanding of all of  the above and had no further questions upon leaving the office. They have my contact information in the event they have any further questions.  I spent 3 minutes counseling on smoking cessation and the health risks of continued tobacco abuse.  I explained to the patient that there has been a high incidence of coronary artery disease noted on these exams. I explained that this is a non-gated exam therefore degree or severity cannot be determined. This patient is on statin therapy. I have asked the patient to follow-up with their PCP regarding any incidental finding of coronary artery disease and management with diet or medication as their PCP  feels is clinically indicated. The patient verbalized understanding of the above and had no further questions upon completion of the visit.   This patient is on blood thinners and if he needs any procedures he will need this to be held.    Magdalen Spatz, NP 03/25/2021

## 2021-03-27 ENCOUNTER — Telehealth: Payer: Self-pay | Admitting: Acute Care

## 2021-03-27 NOTE — Telephone Encounter (Signed)
Call report from [facility]   IMPRESSION: 1. Lung-RADS 4B, suspicious. Additional imaging evaluation or consultation with Pulmonology or Thoracic Surgery recommended. 19.4 mm slightly spiculated nodule in the superior segment left lower lobe. These results will be called to the ordering clinician or representative by the Radiologist Assistant, and communication documented in the PACS or [employer]. 2. Aortic atherosclerosis (ICD10-I70.0). Coronary artery calcification. 3.  Emphysema (ICD10-J43.9).     Electronically Signed   By: Lorin Picket M.D.   On: 03/27/2021 12:22

## 2021-03-27 NOTE — Telephone Encounter (Signed)
I have attempted to call the patient with the results of his low dose CT Chest. There was no answer. I did leave a HIPPA compliant message with the office contact information  asking the patient to call the office for his results. I he has not called I will call him again Monday.

## 2021-03-27 NOTE — Telephone Encounter (Signed)
Noted  

## 2021-03-30 NOTE — Telephone Encounter (Signed)
Routing encounter to Judson Roch.

## 2021-03-30 NOTE — Telephone Encounter (Signed)
See other telephone note 03/27/21

## 2021-04-01 ENCOUNTER — Other Ambulatory Visit (HOSPITAL_COMMUNITY): Payer: Self-pay

## 2021-04-01 ENCOUNTER — Other Ambulatory Visit: Payer: Self-pay

## 2021-04-01 ENCOUNTER — Telehealth: Payer: Self-pay | Admitting: Acute Care

## 2021-04-01 DIAGNOSIS — R911 Solitary pulmonary nodule: Secondary | ICD-10-CM

## 2021-04-01 NOTE — Telephone Encounter (Signed)
Results of Chest CT routed to PCP with follow up plan for PFT's and PET scan

## 2021-04-01 NOTE — Telephone Encounter (Signed)
I have called the patient with the results of his low dose CT. I explained that he has a 19.4 mm nodule that we need to take a close look at. He is in agreement with a PET scan and PFT's. I will place the orders now for both so we can get them scheduled.  Langley Gauss, please fax the results to the PCP and let her know we are planning a PET scan and PFT's. Thanks so much.

## 2021-04-02 ENCOUNTER — Telehealth: Payer: Self-pay | Admitting: Acute Care

## 2021-04-02 NOTE — Telephone Encounter (Signed)
I have called the patient with the results of his low-dose CT.  I explained that he had a 19.4 mm spiculated nodule in the left upper lobe that needs further diagnostic evaluation.  Additionally there was notation of a hypodense lesion to the liver that measured 2.4 cm.  I explained the best options were for a PET scan to better evaluate the areas of concern.  He is in agreement with this plan and PET is scheduled for 04/10/2021. Pulmonary function tests were also ordered to better evaluate pulmonary status. Denise please fax results to PCP, and let them know that we have ordered a PET scan and that we will let them know the results. Patient verbalized understanding of the above and had no further questions at completion of the call.

## 2021-04-02 NOTE — Telephone Encounter (Signed)
CT Results and notation of planned PET scan faxed to PCP

## 2021-04-03 ENCOUNTER — Other Ambulatory Visit: Payer: Self-pay

## 2021-04-06 ENCOUNTER — Other Ambulatory Visit: Payer: Self-pay

## 2021-04-06 DIAGNOSIS — I729 Aneurysm of unspecified site: Secondary | ICD-10-CM

## 2021-04-07 ENCOUNTER — Other Ambulatory Visit: Payer: Self-pay | Admitting: *Deleted

## 2021-04-07 ENCOUNTER — Other Ambulatory Visit: Payer: Self-pay

## 2021-04-07 ENCOUNTER — Encounter: Payer: Self-pay | Admitting: Vascular Surgery

## 2021-04-07 ENCOUNTER — Ambulatory Visit (INDEPENDENT_AMBULATORY_CARE_PROVIDER_SITE_OTHER): Payer: Medicare Other | Admitting: Vascular Surgery

## 2021-04-07 ENCOUNTER — Ambulatory Visit (HOSPITAL_COMMUNITY)
Admission: RE | Admit: 2021-04-07 | Discharge: 2021-04-07 | Disposition: A | Discharge status: Home / Self Care | Payer: Medicare Other | Source: Ambulatory Visit | Attending: Vascular Surgery | Admitting: Vascular Surgery

## 2021-04-07 VITALS — BP 153/56 | HR 66 | Temp 98.0°F | Resp 16 | Ht 69.0 in | Wt 171.0 lb

## 2021-04-07 DIAGNOSIS — T81718A Complication of other artery following a procedure, not elsewhere classified, initial encounter: Secondary | ICD-10-CM | POA: Insufficient documentation

## 2021-04-07 DIAGNOSIS — I998 Other disorder of circulatory system: Secondary | ICD-10-CM

## 2021-04-07 DIAGNOSIS — I729 Aneurysm of unspecified site: Secondary | ICD-10-CM | POA: Insufficient documentation

## 2021-04-07 MED ORDER — CLOPIDOGREL BISULFATE 75 MG PO TABS: 75.0000 mg | ORAL_TABLET | Freq: Every day | ORAL | 6 refills | Status: DC

## 2021-04-07 NOTE — Progress Notes (Signed)
Patient name: [name] [name] MRN: [id number] DOB: Jul 25, 1955 Sex: male  REASON FOR VISIT: Post-op  HPI: [name] [name] is a 66 y.o. male that presents for postop check and hospital follow-up after recent right upper extremity thrombectomy and subsequent right subclavian artery stenting.  He initially presented on 03/02/2021 with right upper extremity ischemia and underwent a right subclavian, axillary, brachial, ulnar and radial artery thrombectomy.  He underwent further work-up and was found to have high-grade right subclavian stenosis that was stented with a 8 x 29 VBX on 03/05/2021 by Dr. Trula Slade.  He was discharged on aspirin Eliquis.  States he has had no problems with the blood thinners.  His right hand is feeling great.  No concerns today.  He has further imaging planned of left lung mass with pulmonology.  Past Medical History:  Diagnosis Date   Arthritis    Chronic right ear pain 07/2019   Coronary artery disease    Depression 06/12/11   Buproprion per Dr. Lamonte Sakai   Drainage from ear, right 07/2019   Dyspnea    Full dentures    Fitting Per Dr. 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 of Cisplatin and Dhydrtion   Skin rash 01/2019   Smoking 06/12/11   1/2 pack/day   Status post chemotherapy 10/30/10 - 11/09/10    2 doses of Q 3 week Cisplatin   Status post radiation therapy 10/11/10 - 12/24/10   Bilateral Neck and Mucosa axis /  70 gray in 35 fractions   Xerostomia     Past Surgical History:  Procedure Laterality Date   APPENDECTOMY     BIOPSY TONGUE     TonsilandBaseoftongue   HERNIA REPAIR     umbilical with mesh   IR NEPHROSTOMY EXCHANGE LEFT  01/10/2017   IR NEPHROSTOMY PLACEMENT LEFT  11/15/2016   LEFT HEART CATH AND CORONARY ANGIOGRAPHY N/A 01/18/2017   Procedure: LEFT HEART CATH AND CORONARY  ANGIOGRAPHY;  Surgeon: [name], MD;  Location: [facility];  Service: Cardiovascular;  Laterality: N/A;   NEPHROLITHOTOMY Left 02/11/2017   Procedure: NEPHROLITHOTOMY PERCUTANEOUS;  Surgeon: [name], MD;  Location: [facility];  Service: Urology;  Laterality: Left;   PEG TUBE REMOVAL  04/13/11   PERIPHERAL VASCULAR INTERVENTION Right 03/05/2021   Procedure: PERIPHERAL VASCULAR INTERVENTION;  Surgeon: Serafina Mitchell, MD;  Location: [facility];  Service: Cardiovascular;  Laterality: Right;  right subclavian   THROMBECTOMY BRACHIAL ARTERY Right 03/02/2021   Procedure: THROMBECTOMY RIGHT SUBCLAVIAN, BRACHIAL, AXILLA, AND RADIAL ARTERY;  Surgeon: Marty Heck, MD;  Location: [facility];  Service: Vascular;  Laterality: Right;   TONSILLECTOMY     right side   UPPER EXTREMITY ANGIOGRAPHY N/A 03/05/2021   Procedure: UPPER EXTREMITY ANGIOGRAPHY;  Surgeon: Serafina Mitchell, MD;  Location: [facility];  Service: Cardiovascular;  Laterality: N/A;    Family History  Problem Relation Age of Onset   Cancer Father 42       colon   Cancer Sister 106       leukemia    SOCIAL HISTORY: Social History   Tobacco Use   Smoking status: Every Day    Packs/day: 1.50    Years: 55.00    Pack years: 82.50  Types: Cigarettes   Smokeless tobacco: Never   Tobacco comments:    Smoking 4-5 cigarettes daily on average  Substance Use Topics   Alcohol use: No    Alcohol/week: 0.0 standard drinks    Allergies  Allergen Reactions   Codeine Nausea Only    Current Outpatient Medications  Medication Sig Dispense Refill   albuterol (VENTOLIN HFA) 108 (90 Base) MCG/ACT inhaler INHALE 2 PUFFS INTO THE LUNGS EVERY 4 (FOUR) HOURS AS NEEDED FOR WHEEZING OR SHORTNESS OF BREATH (COUGH, SHORTNESS OF BREATH OR WHEEZING.). (Patient taking differently: 2 puffs every 6 (six) hours as needed for shortness of breath.) 18 g 11   amLODipine (NORVASC) 10 MG tablet TAKE 1 TABLET (10 MG TOTAL) BY  MOUTH DAILY. (Patient taking differently: Take 10 mg by mouth daily.) 90 tablet 3   apixaban (ELIQUIS) 5 MG TABS tablet Take 2 tablets (10 mg total) by mouth 2 (two) times daily. 60 tablet 0   apixaban (ELIQUIS) 5 MG TABS tablet Take 1 tablet (5 mg total) by mouth 2 (two) times daily. 60 tablet 3   aspirin EC 81 MG EC tablet Take 1 tablet (81 mg total) by mouth daily. Swallow whole. 30 tablet 11   atorvastatin (LIPITOR) 40 MG tablet TAKE 1 TABLET (40 MG TOTAL) BY MOUTH DAILY. (Patient taking differently: Take 40 mg by mouth daily.) 90 tablet 3   budesonide-formoterol (SYMBICORT) 80-4.5 MCG/ACT inhaler INHALE 2 PUFFS INTO THE LUNGS 2 (TWO) TIMES DAILY. 10.2 g 11   docusate sodium (COLACE) 100 MG capsule Take 100 mg by mouth daily as needed for mild constipation.     fluticasone (VERAMYST) 27.5 MCG/SPRAY nasal spray Place 2 sprays into the nose daily as needed for rhinitis.     hydrALAZINE (APRESOLINE) 25 MG tablet TAKE 2 TABLETS (50 MG TOTAL) BY MOUTH EVERY 8 (EIGHT) HOURS. (Patient taking differently: Take 50 mg by mouth every 8 (eight) hours.) 180 tablet 3   HYDROcodone-acetaminophen (NORCO/VICODIN) 5-325 MG tablet Take 1-2 tablets by mouth every 6 (six) hours as needed for moderate pain. 12 tablet 0   hydrocortisone cream 1 % Apply 1 application topically 2 (two) times daily. (Patient taking differently: Apply 1 application topically daily as needed (Dry sckin per patient).) 30 g 6   isosorbide mononitrate (IMDUR) 30 MG 24 hr tablet TAKE 1 TABLET (30 MG TOTAL) BY MOUTH DAILY. (Patient taking differently: Take 30 mg by mouth daily.) 90 tablet 3   levothyroxine (SYNTHROID) 112 MCG tablet Take 1 tablet (112 mcg total) by mouth daily. 90 tablet 3   metoprolol tartrate (LOPRESSOR) 25 MG tablet TAKE 1 TABLET (25 MG TOTAL) BY MOUTH 2 (TWO) TIMES DAILY. (Patient taking differently: Take 25 mg by mouth 2 (two) times daily.) 180 tablet 3   mirtazapine (REMERON SOL-TAB) 15 MG disintegrating tablet Take 1 tablet  (15 mg total) by mouth at bedtime. 90 tablet 3   tamsulosin (FLOMAX) 0.4 MG CAPS capsule Take 1 capsule (0.4 mg total) by mouth daily. 90 capsule 3   Vitamin D, Ergocalciferol, (DRISDOL) 1.25 MG (50000 UNIT) CAPS capsule TAKE 1 CAPSULE (50,000 UNITS TOTAL) BY MOUTH EVERY 7 (SEVEN) DAYS. (Patient taking differently: Take 50,000 Units by mouth every 7 (seven) days.) 5 capsule 6   No current facility-administered medications for this visit.    REVIEW OF SYSTEMS:  [X]  denotes positive finding, [ ]  denotes negative finding Cardiac  Comments:  Chest pain or chest pressure:    Shortness of breath upon exertion:    Short  of breath when lying flat:    Irregular heart rhythm:        Vascular    Pain in calf, thigh, or hip brought on by ambulation:    Pain in feet at night that wakes you up from your sleep:     Blood clot in your veins:    Leg swelling:         Pulmonary    Oxygen at home:    Productive cough:     Wheezing:         Neurologic    Sudden weakness in arms or legs:     Sudden numbness in arms or legs:     Sudden onset of difficulty speaking or slurred speech:    Temporary loss of vision in one eye:     Problems with dizziness:         Gastrointestinal    Blood in stool:     Vomited blood:         Genitourinary    Burning when urinating:     Blood in urine:        Psychiatric    Major depression:         Hematologic    Bleeding problems:    Problems with blood clotting too easily:        Skin    Rashes or ulcers:        Constitutional    Fever or chills:      PHYSICAL EXAM: Vitals:   04/07/21 1439  BP: (!) 153/56  Pulse: 66  Resp: 16  Temp: 98 F (36.7 C)  TempSrc: Temporal  SpO2: 94%  Weight: 171 lb (77.6 kg)  Height: 5\' 9"  (1.753 m)    GENERAL: The patient is a well-nourished male, in no acute distress. The vital signs are documented above. CARDIAC: There is a regular rate and rhythm.  VASCULAR:  Right brachial and radial pulse  palpable Brisk right palmar arch signal Right hand is warm with no tissue loss Right antecubital incision well healed   DATA:   Right upper extremity arterial duplex reviewed with triphasic flow throughout the right upper extremity  Assessment/Plan:  66 year old male that presented on 03/02/2021 with right upper extremity ischemia and underwent a right subclavian, axillary, brachial, ulnar, and radial artery thrombectomy by myself.  He underwent further work-up and was found to have high-grade right subclavian stenosis that was stented with a 8 x 29 VBX on 03/05/2021 by Dr. Trula Slade.  He has triphasic flow throughout the right upper extremity with a palpable radial pulse at the wrist and brisk flow in the palmar arch.    I discussed that I suspect this high-grade right subclavian stenosis was the etiology for his right upper extremity ischemia with an embolizing lesion.  Now that this has been treated, I am okay with him stopping Eliquis which I advised today.  I do think he would benefit from dual antiplatelet therapy and I have added Plavix to his aspirin regimen.  I will have him come back in 1 year in the [facility] clinic for surveillance with right upper extremity arterial duplex   Marty Heck, MD [facility]: [phone]

## 2021-04-07 NOTE — Patient Outreach (Signed)
[facility]) Care Management  04/07/2021  [name] [name] June 05, 1955 [id number]   [facility] Case closure    Mr Savant was referred to [facility] on 03/09/21 for post hospital screening after admission 03/02/21 to 03/06/21 s/p RUE thrombectomy on 03/02/21 for ischemia of right arm (Dr Wilmer Floor) dx artrial occlusion  Unsuccessful outreach letter sent on 03/11/21 without a response  Unsuccessful outreach on 03/11/21, 03/18/21, 03/23/21   Plan [facility] RN CM will close case after no response from patient within 10+ business days. Unable to reach Case closure letters sent to patient and MD  Joelene Millin L. Lavina Hamman, RN, BSN, [place] Coordinator Office number [phone] Mobile number [phone]  Main [facility] number [phone] Fax number [phone]

## 2021-04-10 ENCOUNTER — Other Ambulatory Visit: Payer: Self-pay

## 2021-04-10 ENCOUNTER — Ambulatory Visit (HOSPITAL_COMMUNITY)
Admission: RE | Admit: 2021-04-10 | Discharge: 2021-04-10 | Disposition: A | Discharge status: Home / Self Care | Payer: Medicare Other | Source: Ambulatory Visit | Attending: Acute Care | Admitting: Acute Care

## 2021-04-10 DIAGNOSIS — R911 Solitary pulmonary nodule: Secondary | ICD-10-CM | POA: Diagnosis present

## 2021-04-10 LAB — GLUCOSE, CAPILLARY: Glucose-Capillary: 84 mg/dL (ref 70–99)

## 2021-04-10 MED ORDER — FLUDEOXYGLUCOSE F - 18 (FDG) INJECTION
8.5300 | Freq: Once | INTRAVENOUS | Status: AC
  Administered 2021-04-10: 8.53 via INTRAVENOUS

## 2021-04-13 ENCOUNTER — Other Ambulatory Visit: Payer: Self-pay

## 2021-04-14 ENCOUNTER — Other Ambulatory Visit: Payer: Self-pay

## 2021-04-16 ENCOUNTER — Other Ambulatory Visit: Payer: Self-pay | Admitting: Nurse Practitioner

## 2021-04-16 ENCOUNTER — Other Ambulatory Visit: Payer: Self-pay

## 2021-04-16 DIAGNOSIS — E559 Vitamin D deficiency, unspecified: Secondary | ICD-10-CM

## 2021-04-17 ENCOUNTER — Other Ambulatory Visit: Payer: Self-pay

## 2021-04-18 MED ORDER — VITAMIN D (ERGOCALCIFEROL) 1.25 MG (50000 UNIT) PO CAPS
ORAL_CAPSULE | ORAL | 3 refills | Status: DC
  Filled 2021-04-18: qty 5, fill #0
  Filled 2021-04-20: qty 5, 35d supply, fill #0
  Filled 2021-06-09 – 2021-06-11 (×2): qty 5, 35d supply, fill #1
  Filled 2021-08-12: qty 5, 35d supply, fill #2
  Filled 2021-09-13: qty 5, 35d supply, fill #3

## 2021-04-20 ENCOUNTER — Other Ambulatory Visit: Payer: Self-pay

## 2021-04-22 ENCOUNTER — Telehealth: Payer: Self-pay | Admitting: Acute Care

## 2021-04-22 NOTE — Telephone Encounter (Signed)
I have attempted to call the patient with the results of his PET scan There was no answer. I have left a HIPPA compliant message on the VM requesting that he call the office . I will attempt to call the patient again 04/23/2021. He will need an appointment in a nodule slot with Dr. Valeta Harms or [place].

## 2021-04-23 ENCOUNTER — Telehealth: Payer: Self-pay | Admitting: Acute Care

## 2021-04-23 NOTE — Telephone Encounter (Signed)
Spoke with pt and scheduled PFT for 05/15/21 1:00. Pt verbalized understanding.

## 2021-04-23 NOTE — Telephone Encounter (Signed)
See telephone note 04/23/21

## 2021-04-23 NOTE — Telephone Encounter (Signed)
I have called Mr. [name] [name] with the results of his PET scan.  I explained that his scan showed a 19 mm left lower lobe pulmonary nodule (image 66/4) is markedly hypermetabolic with SUV max = 59.7. There is no hypermetabolic lymphadenopathy in the left hilum or mediastinum. No hypermetabolic lymphadenopathy elsewhere in the chest. I explained that I will get the patient scheduled with a pulmonary doctor to review the PET scan and options for treatment. Currently looking at 04/28/2021 appointment with Dr. Lamonte Sakai . I am awaiting his agreement to place the patient into one of his held nodule spots at 1030 am on 2/28.  The other option is for  referral to [facility] as this patient has a single lesion with no  hypermetabolic lymphadenopathy in the left hilum or mediastinum. No hypermetabolic lymphadenopathy elsewhere in the chest. Langley Gauss, please place on tickle list , if this is not a cancer he will return to the screening population. Thanks so much Forrest, he had PFT's ordered but not scheduled, can we see why? Thanks

## 2021-04-24 ENCOUNTER — Other Ambulatory Visit: Payer: Self-pay

## 2021-04-28 ENCOUNTER — Other Ambulatory Visit: Payer: Self-pay

## 2021-04-28 ENCOUNTER — Ambulatory Visit: Payer: Medicare Other | Admitting: Emergency Medicine

## 2021-04-28 ENCOUNTER — Encounter: Payer: Self-pay | Admitting: Emergency Medicine

## 2021-04-28 DIAGNOSIS — R911 Solitary pulmonary nodule: Secondary | ICD-10-CM | POA: Diagnosis not present

## 2021-04-28 DIAGNOSIS — J449 Chronic obstructive pulmonary disease, unspecified: Secondary | ICD-10-CM

## 2021-04-28 DIAGNOSIS — C7A8 Other malignant neuroendocrine tumors: Secondary | ICD-10-CM | POA: Insufficient documentation

## 2021-04-28 DIAGNOSIS — Z720 Tobacco use: Secondary | ICD-10-CM

## 2021-04-28 NOTE — Assessment & Plan Note (Signed)
Isolated hypermetabolic pulmonary nodule.  Discussed the pros and cons of navigational bronchoscopy versus referral for primary resection.  Suspect he has some significant risk for a primary surgery although his PFT are still pending and we will follow these.  He continues to smoke which is a barrier.  We will get the PFT review these and then determine timing of any diagnostics, probably navigational bronchoscopy.  Of note he has had radiation therapy before, is concerned about the side effects and any more radiation that might be necessary.  Similarly he is concerned about possible chemotherapy side effects.  He want to hear all the pros and cons before proceeding with therapy.  Please get your pulmonary function testing in March as planned. We reviewed your CT scan and PET scan today.  We will discuss next steps for diagnosis and treatment of a left sided pulmonary nodule after we reviewed your pulmonary function testing next time. Follow with Dr Lamonte Sakai after the PFT. We will make you an appointment

## 2021-04-28 NOTE — Assessment & Plan Note (Signed)
Please continue Symbicort 2 puffs twice a day.  Depending on your PFT we may decide to change your inhaler. Keep albuterol available to use 2 puffs up to every 4 hours if needed for shortness of breath, chest tightness, wheezing.

## 2021-04-28 NOTE — Patient Instructions (Signed)
Please get your pulmonary function testing in March as planned. We reviewed your CT scan and PET scan today.  We will discuss next steps for diagnosis and treatment of a left sided pulmonary nodule after we reviewed your pulmonary function testing next time. Please continue Symbicort 2 puffs twice a day.  Depending on your PFT we may decide to change your inhaler. Keep albuterol available to use 2 puffs up to every 4 hours if needed for shortness of breath, chest tightness, wheezing.  You would benefit from stopping smoking.  Congratulations on cutting down. Follow with Dr Lamonte Sakai after the PFT. We will make you an appointment

## 2021-04-28 NOTE — Assessment & Plan Note (Signed)
You would benefit from stopping smoking.  Congratulations on cutting down.

## 2021-04-28 NOTE — Progress Notes (Signed)
Subjective:    Patient ID: [name] [name], male    DOB: 08-20-1955, 66 y.o.   MRN: [id number]  HPI 66 year old gentleman with a history of tobacco use (83 pack years), oropharyngeal/throat cancer (2012), hypertension, CAD, hypothyroidism.  He had a CT angio of his chest 03/04/2021 and then lung cancer screening CT scan 03/25/2021 as below.  Prompted PET scan.  He is here to discuss the imaging and plan next steps.  Pulmonary function testing was ordered but has not yet been done.  He is on Symbicort, has albuterol but never uses.  Reports that he gets dyspnea with walking a long distance. He has to stop to rest after a city block. He can walk through a large store but it exhausts him. Unsure whether he gets benefit from Symbicort. He has daily cough, clear phlegm. Unsure that he can or wants to quit smoking. He has hesitation about therapy - has had downstream complications from his prior XRT for his throat CA.    CT angiography chest 03/04/2021 reviewed by me, shows left superior segmental lobulated pulmonary nodule 2.1 cm that abuts the pleura with background emphysema, some retained secretions in the left mainstem.  No apparent mediastinal or hilar adenopathy.  CT lung cancer screening 03/25/2021 reviewed by me confirms a 1.9 cm slightly spiculated medial left lower lobe superior segmental nodule, some debris dependently in the airway, question endobronchial lesion.  PET scan 04/10/2021 reviewed by me confirms a 1.9 cm left lower lobe nodule that is markedly hypermetabolic without any lymphadenopathy.  No evidence of any distant disease.     Review of Systems As per HPI  Past Medical History:  Diagnosis Date   Arthritis    Chronic right ear pain 07/2019   Coronary artery disease    Depression 06/12/11   Buproprion per Dr. Lamonte Sakai   Drainage from ear, right 07/2019   Dyspnea    Full dentures    Fitting Per Dr. 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 of Cisplatin and Dhydrtion   Skin rash 01/2019   Smoking 06/12/11   1/2 pack/day   Status post chemotherapy 10/30/10 - 11/09/10    2 doses of Q 3 week Cisplatin   Status post radiation therapy 10/11/10 - 12/24/10   Bilateral Neck and Mucosa axis /  70 gray in 35 fractions   Xerostomia      Family History  Problem Relation Age of Onset   Cancer Father 75       colon   Cancer Sister 1       leukemia     Social History   Socioeconomic History   Marital status: Single    Spouse name: Not on file   Number of children: Not on file   Years of education: Not on file   Highest education level: Not on file  Occupational History   Not on file  Tobacco Use   Smoking status: Every Day    Packs/day: 1.50    Years: 55.00    Pack years: 82.50    Types: Cigarettes   Smokeless tobacco: Never   Tobacco comments:    10 cigarettes smoked daily [facility] 04/28/21  Vaping Use   Vaping Use: Never used  Substance and Sexual Activity  Alcohol use: No    Alcohol/week: 0.0 standard drinks   Drug use: No   Sexual activity: Not Currently  Other Topics Concern   Not on file  Social History Narrative   Not on file   Social Determinants of Health   Financial Resource Strain: Low Risk    Difficulty of Paying Living Expenses: Not hard at all  Food Insecurity: No Food Insecurity   Worried About Charity fundraiser in the Last Year: Never true   [place] in the Last Year: Never true  Transportation Needs: No Transportation Needs   Lack of Transportation (Medical): No   Lack of Transportation (Non-Medical): No  Physical Activity: Inactive   Days of Exercise per Week: 0 days   Minutes of Exercise per Session: 0 min  Stress: Stress Concern Present   Feeling of Stress : To some extent  Social Connections: Socially Isolated   Frequency of  Communication with Friends and Family: More than three times a week   Frequency of Social Gatherings with Friends and Family: Twice a week   Attends Religious Services: Never   Marine scientist or Organizations: No   Attends Music therapist: Not on file   Marital Status: Widowed  Human resources officer Violence: Not At Risk   Fear of Current or Ex-Partner: No   Emotionally Abused: No   Physically Abused: No   Sexually Abused: No     Allergies  Allergen Reactions   Codeine Nausea Only     Outpatient Medications Prior to Visit  Medication Sig Dispense Refill   albuterol (VENTOLIN HFA) 108 (90 Base) MCG/ACT inhaler INHALE 2 PUFFS INTO THE LUNGS EVERY 4 (FOUR) HOURS AS NEEDED FOR WHEEZING OR SHORTNESS OF BREATH (COUGH, SHORTNESS OF BREATH OR WHEEZING.). (Patient taking differently: 2 puffs every 6 (six) hours as needed for shortness of breath.) 18 g 11   amLODipine (NORVASC) 10 MG tablet TAKE 1 TABLET (10 MG TOTAL) BY MOUTH DAILY. (Patient taking differently: Take 10 mg by mouth daily.) 90 tablet 3   aspirin EC 81 MG EC tablet Take 1 tablet (81 mg total) by mouth daily. Swallow whole. 30 tablet 11   atorvastatin (LIPITOR) 40 MG tablet TAKE 1 TABLET (40 MG TOTAL) BY MOUTH DAILY. (Patient taking differently: Take 40 mg by mouth daily.) 90 tablet 3   budesonide-formoterol (SYMBICORT) 80-4.5 MCG/ACT inhaler INHALE 2 PUFFS INTO THE LUNGS 2 (TWO) TIMES DAILY. 10.2 g 11   clopidogrel (PLAVIX) 75 MG tablet Take 1 tablet (75 mg total) by mouth daily. 30 tablet 6   docusate sodium (COLACE) 100 MG capsule Take 100 mg by mouth daily as needed for mild constipation.     fluticasone (VERAMYST) 27.5 MCG/SPRAY nasal spray Place 2 sprays into the nose daily as needed for rhinitis.     hydrALAZINE (APRESOLINE) 25 MG tablet TAKE 2 TABLETS (50 MG TOTAL) BY MOUTH EVERY 8 (EIGHT) HOURS. (Patient taking differently: Take 50 mg by mouth every 8 (eight) hours.) 180 tablet 3   isosorbide mononitrate  (IMDUR) 30 MG 24 hr tablet TAKE 1 TABLET (30 MG TOTAL) BY MOUTH DAILY. (Patient taking differently: Take 30 mg by mouth daily.) 90 tablet 3   levothyroxine (SYNTHROID) 112 MCG tablet Take 1 tablet (112 mcg total) by mouth daily. 90 tablet 3   metoprolol tartrate (LOPRESSOR) 25 MG tablet TAKE 1 TABLET (25 MG TOTAL) BY MOUTH 2 (TWO) TIMES DAILY. (Patient taking differently: Take 25 mg by mouth 2 (two) times  daily.) 180 tablet 3   mirtazapine (REMERON SOL-TAB) 15 MG disintegrating tablet Take 1 tablet (15 mg total) by mouth at bedtime. 90 tablet 3   tamsulosin (FLOMAX) 0.4 MG CAPS capsule Take 1 capsule (0.4 mg total) by mouth daily. 90 capsule 3   Vitamin D, Ergocalciferol, (DRISDOL) 1.25 MG (50000 UNIT) CAPS capsule TAKE 1 CAPSULE (50,000 UNITS TOTAL) BY MOUTH EVERY 7 (SEVEN) DAYS. 5 capsule 3   HYDROcodone-acetaminophen (NORCO/VICODIN) 5-325 MG tablet Take 1-2 tablets by mouth every 6 (six) hours as needed for moderate pain. 12 tablet 0   hydrocortisone cream 1 % Apply 1 application topically 2 (two) times daily. (Patient taking differently: Apply 1 application topically daily as needed (Dry sckin per patient).) 30 g 6   No facility-administered medications prior to visit.         Objective:   Physical Exam Vitals:   04/28/21 1046  BP: 108/64  Pulse: (!) 54  Temp: 97.8 F (36.6 C)  TempSrc: Oral  SpO2: 96%  Weight: 171 lb (77.6 kg)  Height: 5\' 9"  (1.753 m)   Gen: Pleasant, well-nourished, in no distress,  normal affect  ENT: No lesions,  mouth clear,  oropharynx clear, no postnasal drip  Neck: No JVD, no stridor  Lungs: No use of accessory muscles, distant, no crackles or wheezing on normal respiration, no wheeze on forced expiration  Cardiovascular: RRR, heart sounds normal, no murmur or gallops, no peripheral edema  Musculoskeletal: No deformities, no cyanosis or clubbing  Neuro: alert, awake, non focal  Skin: Warm, no lesions or rash      Assessment & Plan:   Pulmonary nodule Isolated hypermetabolic pulmonary nodule.  Discussed the pros and cons of navigational bronchoscopy versus referral for primary resection.  Suspect he has some significant risk for a primary surgery although his PFT are still pending and we will follow these.  He continues to smoke which is a barrier.  We will get the PFT review these and then determine timing of any diagnostics, probably navigational bronchoscopy.  Of note he has had radiation therapy before, is concerned about the side effects and any more radiation that might be necessary.  Similarly he is concerned about possible chemotherapy side effects.  He want to hear all the pros and cons before proceeding with therapy.  Please get your pulmonary function testing in March as planned. We reviewed your CT scan and PET scan today.  We will discuss next steps for diagnosis and treatment of a left sided pulmonary nodule after we reviewed your pulmonary function testing next time. Follow with Dr Lamonte Sakai after the PFT. We will make you an appointment  Tobacco use You would benefit from stopping smoking.  Congratulations on cutting down.  COPD (chronic obstructive pulmonary disease) (HCC) Please continue Symbicort 2 puffs twice a day.  Depending on your PFT we may decide to change your inhaler. Keep albuterol available to use 2 puffs up to every 4 hours if needed for shortness of breath, chest tightness, wheezing.    Baltazar Apo, MD, PhD 04/28/2021, 1:26 PM [facility] [phone] or if no answer before 7:00PM call [phone] For any issues after 7:00PM please call eLink [phone]

## 2021-04-29 ENCOUNTER — Other Ambulatory Visit: Payer: Self-pay

## 2021-05-11 ENCOUNTER — Other Ambulatory Visit: Payer: Self-pay

## 2021-05-15 ENCOUNTER — Ambulatory Visit (INDEPENDENT_AMBULATORY_CARE_PROVIDER_SITE_OTHER): Payer: Medicare Other | Admitting: Emergency Medicine

## 2021-05-15 ENCOUNTER — Other Ambulatory Visit: Payer: Self-pay

## 2021-05-15 DIAGNOSIS — R911 Solitary pulmonary nodule: Secondary | ICD-10-CM | POA: Diagnosis not present

## 2021-05-15 LAB — PULMONARY FUNCTION TEST
DL/VA % pred: 61 %
DL/VA: 2.54 ml/min/mmHg/L
DLCO cor % pred: 60 %
DLCO cor: 15.61 ml/min/mmHg
DLCO unc % pred: 60 %
DLCO unc: 15.61 ml/min/mmHg
FEF 25-75 Post: 1.49 L/sec
FEF 25-75 Pre: 1.33 L/sec
FEF2575-%Change-Post: 12 %
FEF2575-%Pred-Post: 57 %
FEF2575-%Pred-Pre: 51 %
FEV1-%Change-Post: 2 %
FEV1-%Pred-Post: 78 %
FEV1-%Pred-Pre: 76 %
FEV1-Post: 2.56 L
FEV1-Pre: 2.51 L
FEV1FVC-%Change-Post: 0 %
FEV1FVC-%Pred-Pre: 87 %
FEV6-%Change-Post: 3 %
FEV6-%Pred-Post: 91 %
FEV6-%Pred-Pre: 88 %
FEV6-Post: 3.8 L
FEV6-Pre: 3.69 L
FEV6FVC-%Change-Post: 1 %
FEV6FVC-%Pred-Post: 103 %
FEV6FVC-%Pred-Pre: 101 %
FVC-%Change-Post: 1 %
FVC-%Pred-Post: 88 %
FVC-%Pred-Pre: 86 %
FVC-Post: 3.88 L
FVC-Pre: 3.83 L
Post FEV1/FVC ratio: 66 %
Post FEV6/FVC ratio: 98 %
Pre FEV1/FVC ratio: 65 %
Pre FEV6/FVC Ratio: 96 %
RV % pred: 147 %
RV: 3.4 L
TLC % pred: 109 %
TLC: 7.44 L

## 2021-05-15 NOTE — Progress Notes (Signed)
PFT done today. 

## 2021-05-19 ENCOUNTER — Ambulatory Visit: Payer: Medicare Other | Admitting: Emergency Medicine

## 2021-06-03 ENCOUNTER — Ambulatory Visit (INDEPENDENT_AMBULATORY_CARE_PROVIDER_SITE_OTHER): Payer: Medicare Other | Admitting: Emergency Medicine

## 2021-06-03 ENCOUNTER — Encounter: Payer: Self-pay | Admitting: Emergency Medicine

## 2021-06-03 VITALS — BP 138/76 | HR 60 | Temp 98.2°F | Ht 69.0 in | Wt 168.0 lb

## 2021-06-03 DIAGNOSIS — R911 Solitary pulmonary nodule: Secondary | ICD-10-CM

## 2021-06-03 DIAGNOSIS — J449 Chronic obstructive pulmonary disease, unspecified: Secondary | ICD-10-CM

## 2021-06-03 MED ORDER — BREZTRI AEROSPHERE 160-9-4.8 MCG/ACT IN AERO: 2.0000 | INHALATION_SPRAY | Freq: Two times a day (BID) | RESPIRATORY_TRACT | 0 refills | Status: DC

## 2021-06-03 NOTE — Assessment & Plan Note (Signed)
Referral to thoracic surgery as above. ?

## 2021-06-03 NOTE — Progress Notes (Signed)
? ?Subjective:  ? ? Patient ID: [name] [name], male    DOB: 1955-10-31, 66 y.o.   MRN: [id number] ? ?HPI ?66 year old gentleman with a history of tobacco use (83 pack years), oropharyngeal/throat cancer (2012), hypertension, CAD, hypothyroidism.  He had a CT angio of his chest 03/04/2021 and then lung cancer screening CT scan 03/25/2021 as below.  Prompted PET scan.  He is here to discuss the imaging and plan next steps.  Pulmonary function testing was ordered but has not yet been done.  He is on Symbicort, has albuterol but never uses.  ?Reports that he gets dyspnea with walking a long distance. He has to stop to rest after a city block. He can walk through a large store but it exhausts him. Unsure whether he gets benefit from Symbicort. He has daily cough, clear phlegm. Unsure that he can or wants to quit smoking. He has hesitation about therapy - has had downstream complications from his prior XRT for his throat CA.  ? ?CT angiography chest 03/04/2021 reviewed by me, shows left superior segmental lobulated pulmonary nodule 2.1 cm that abuts the pleura with background emphysema, some retained secretions in the left mainstem.  No apparent mediastinal or hilar adenopathy. ? ?CT lung cancer screening 03/25/2021 reviewed by me confirms a 1.9 cm slightly spiculated medial left lower lobe superior segmental nodule, some debris dependently in the airway, question endobronchial lesion. ? ?PET scan 04/10/2021 reviewed by me confirms a 1.9 cm left lower lobe nodule that is markedly hypermetabolic without any lymphadenopathy.  No evidence of any distant disease. ? ? ?ROV 06/03/21 --follow-up visit 66 year old gentleman with history of tobacco use, throat cancer, hypertension.  He was found to have a left superior segmental lobulated pulmonary nodule on chest imaging, underwent PET scan 04/10/2021 that showed the nodule is markedly hypermetabolic.  We discussed possible navigational bronchoscopy versus referral for primary resection.   He also has some concerns about possible additional radiation, chemotherapy.  He wanted to see his PFT and consider before we decide to proceed with any intervention. He is out of his Symbicort, may need a refill from [place]. Uses albuterol about  ? ?Pulmonary function testing performed 05/15/2021 reviewed by me show moderate obstruction without a bronchodilator response, hyperinflated lung volumes and a decreased diffusion capacity. ? ? ? ?Review of Systems ?As per HPI ? ?Past Medical History:  ?Diagnosis Date  ? Arthritis   ? Chronic right ear pain 07/2019  ? Coronary artery disease   ? Depression 06/12/11  ? Buproprion per Dr. Lamonte Sakai  ? Drainage from ear, right 07/2019  ? Dyspnea   ? Full dentures   ? Fitting Per Dr. Enrique Sack  ? Heart murmur   ? History of kidney stones   ? Hypertension   ? Hypothyroidism   ? Oropharyngeal cancer ([place]) 2012  ? throat  ? Protein calorie malnutrition ([place]) 05/31/11  ? Renal insufficiency 2012  ? Secondary to Hx. of Cisplatin and Dhydrtion  ? Skin rash 01/2019  ? Smoking 06/12/11  ? 1/2 pack/day  ? Status post chemotherapy 10/30/10 - 11/09/10  ?  2 doses of Q 3 week Cisplatin  ? Status post radiation therapy 10/11/10 - 12/24/10  ? Bilateral Neck and Mucosa axis /  70 gray in 35 fractions  ? Xerostomia   ?  ? ?Family History  ?Problem Relation Age of Onset  ? Cancer Father 66  ?     colon  ? Cancer Sister 54  ?  leukemia  ?  ? ?Social History  ? ?Socioeconomic History  ? Marital status: Single  ?  Spouse name: Not on file  ? Number of children: Not on file  ? Years of education: Not on file  ? Highest education level: Not on file  ?Occupational History  ? Not on file  ?Tobacco Use  ? Smoking status: Every Day  ?  Packs/day: 1.50  ?  Years: 55.00  ?  Pack years: 82.50  ?  Types: Cigarettes  ? Smokeless tobacco: Never  ? Tobacco comments:  ?  10 cigarettes smoked daily ARJ 06/03/21  ?Vaping Use  ? Vaping Use: Never used  ?Substance and Sexual Activity  ? Alcohol use: No  ?  Alcohol/week: 0.0 standard  drinks  ? Drug use: No  ? Sexual activity: Not Currently  ?Other Topics Concern  ? Not on file  ?Social History Narrative  ? Not on file  ? ?Social Determinants of Health  ? ?Financial Resource Strain: Low Risk   ? Difficulty of Paying Living Expenses: Not hard at all  ?Food Insecurity: No Food Insecurity  ? Worried About Charity fundraiser in the Last Year: Never true  ? Ran Out of Food in the Last Year: Never true  ?Transportation Needs: No Transportation Needs  ? Lack of Transportation (Medical): No  ? Lack of Transportation (Non-Medical): No  ?Physical Activity: Inactive  ? Days of Exercise per Week: 0 days  ? Minutes of Exercise per Session: 0 min  ?Stress: Stress Concern Present  ? Feeling of Stress : To some extent  ?Social Connections: Socially Isolated  ? Frequency of Communication with Friends and Family: More than three times a week  ? Frequency of Social Gatherings with Friends and Family: Twice a week  ? Attends Religious Services: Never  ? Active Member of Clubs or Organizations: No  ? Attends Archivist Meetings: Not on file  ? Marital Status: Widowed  ?Intimate Partner Violence: Not At Risk  ? Fear of Current or Ex-Partner: No  ? Emotionally Abused: No  ? Physically Abused: No  ? Sexually Abused: No  ?  ? ?Allergies  ?Allergen Reactions  ? Codeine Nausea Only  ?  ? ?Outpatient Medications Prior to Visit  ?Medication Sig Dispense Refill  ? albuterol (VENTOLIN HFA) 108 (90 Base) MCG/ACT inhaler INHALE 2 PUFFS INTO THE LUNGS EVERY 4 (FOUR) HOURS AS NEEDED FOR WHEEZING OR SHORTNESS OF BREATH (COUGH, SHORTNESS OF BREATH OR WHEEZING.). (Patient taking differently: 2 puffs every 6 (six) hours as needed for shortness of breath.) 18 g 11  ? amLODipine (NORVASC) 10 MG tablet TAKE 1 TABLET (10 MG TOTAL) BY MOUTH DAILY. (Patient taking differently: Take 10 mg by mouth daily.) 90 tablet 3  ? aspirin EC 81 MG EC tablet Take 1 tablet (81 mg total) by mouth daily. Swallow whole. 30 tablet 11  ?  atorvastatin (LIPITOR) 40 MG tablet TAKE 1 TABLET (40 MG TOTAL) BY MOUTH DAILY. (Patient taking differently: Take 40 mg by mouth daily.) 90 tablet 3  ? budesonide-formoterol (SYMBICORT) 80-4.5 MCG/ACT inhaler INHALE 2 PUFFS INTO THE LUNGS 2 (TWO) TIMES DAILY. 10.2 g 11  ? clopidogrel (PLAVIX) 75 MG tablet Take 1 tablet (75 mg total) by mouth daily. 30 tablet 6  ? docusate sodium (COLACE) 100 MG capsule Take 100 mg by mouth daily as needed for mild constipation.    ? fluticasone (VERAMYST) 27.5 MCG/SPRAY nasal spray Place 2 sprays into the nose daily as  needed for rhinitis.    ? hydrALAZINE (APRESOLINE) 25 MG tablet TAKE 2 TABLETS (50 MG TOTAL) BY MOUTH EVERY 8 (EIGHT) HOURS. (Patient taking differently: Take 50 mg by mouth every 8 (eight) hours.) 180 tablet 3  ? isosorbide mononitrate (IMDUR) 30 MG 24 hr tablet TAKE 1 TABLET (30 MG TOTAL) BY MOUTH DAILY. (Patient taking differently: Take 30 mg by mouth daily.) 90 tablet 3  ? levothyroxine (SYNTHROID) 112 MCG tablet Take 1 tablet (112 mcg total) by mouth daily. 90 tablet 3  ? metoprolol tartrate (LOPRESSOR) 25 MG tablet TAKE 1 TABLET (25 MG TOTAL) BY MOUTH 2 (TWO) TIMES DAILY. (Patient taking differently: Take 25 mg by mouth 2 (two) times daily.) 180 tablet 3  ? mirtazapine (REMERON SOL-TAB) 15 MG disintegrating tablet Take 1 tablet (15 mg total) by mouth at bedtime. 90 tablet 3  ? tamsulosin (FLOMAX) 0.4 MG CAPS capsule Take 1 capsule (0.4 mg total) by mouth daily. 90 capsule 3  ? Vitamin D, Ergocalciferol, (DRISDOL) 1.25 MG (50000 UNIT) CAPS capsule TAKE 1 CAPSULE (50,000 UNITS TOTAL) BY MOUTH EVERY 7 (SEVEN) DAYS. 5 capsule 3  ? HYDROcodone-acetaminophen (NORCO/VICODIN) 5-325 MG tablet Take 1-2 tablets by mouth every 6 (six) hours as needed for moderate pain. 12 tablet 0  ? hydrocortisone cream 1 % Apply 1 application topically 2 (two) times daily. (Patient taking differently: Apply 1 application topically daily as needed (Dry sckin per patient).) 30 g 6  ? ?No  facility-administered medications prior to visit.  ? ? ? ? ?   ?Objective:  ? Physical Exam ?Vitals:  ? 06/03/21 1644  ?BP: 138/76  ?Pulse: 60  ?Temp: 98.2 ?F (36.8 ?C)  ?TempSrc: Oral  ?SpO2: 96%  ?Weight: 168

## 2021-06-03 NOTE — Assessment & Plan Note (Addendum)
Moderately severe obstruction noted on his pulmonary function testing.  His FEV1 would not preclude primary resection.  He will need to stop smoking and we talked about this today.  I am going to refer him to speak with thoracic surgery.  He understands that the alternative would be to undergo bronchoscopy, tissue diagnosis.  I am willing to arrange this if surgery is not an option.  He has hesitation about having any more chemo or radiation ? ?We will try changing his Symbicort to [facility] to see if he gets more benefit. ?

## 2021-06-03 NOTE — Patient Instructions (Signed)
We reviewed your pulmonary function testing today ?We will hold off on restarting her Symbicort for now ?Please try Breztri 2 puffs twice a day.  Rinse and gargle after using.  Keep track of whether this medication helps you more.  If so we will continue it going forward. ?Keep your albuterol available to use 2 puffs when you needed for shortness of breath, chest tightness, wheezing. ?We will refer you to thoracic surgery to discuss possible primary resection of your left lung nodule. ?You need to work hard on stopping smoking. ?Follow with Dr. Lamonte Sakai in 2 months or sooner if you have any problems.  ? ?

## 2021-06-09 ENCOUNTER — Other Ambulatory Visit (HOSPITAL_COMMUNITY): Payer: Self-pay

## 2021-06-11 ENCOUNTER — Other Ambulatory Visit: Payer: Self-pay

## 2021-06-11 ENCOUNTER — Other Ambulatory Visit (HOSPITAL_COMMUNITY): Payer: Self-pay

## 2021-06-12 ENCOUNTER — Ambulatory Visit: Payer: Self-pay | Admitting: Nurse Practitioner

## 2021-06-16 ENCOUNTER — Encounter: Payer: Self-pay | Admitting: Nurse Practitioner

## 2021-06-16 ENCOUNTER — Ambulatory Visit (INDEPENDENT_AMBULATORY_CARE_PROVIDER_SITE_OTHER): Payer: Medicare Other | Admitting: Nurse Practitioner

## 2021-06-16 VITALS — BP 101/66 | HR 58 | Temp 98.0°F | Ht 69.0 in | Wt 171.4 lb

## 2021-06-16 DIAGNOSIS — E039 Hypothyroidism, unspecified: Secondary | ICD-10-CM | POA: Diagnosis not present

## 2021-06-16 DIAGNOSIS — Z720 Tobacco use: Secondary | ICD-10-CM

## 2021-06-16 DIAGNOSIS — H918X1 Other specified hearing loss, right ear: Secondary | ICD-10-CM | POA: Diagnosis not present

## 2021-06-16 DIAGNOSIS — H9211 Otorrhea, right ear: Secondary | ICD-10-CM

## 2021-06-16 DIAGNOSIS — I10 Essential (primary) hypertension: Secondary | ICD-10-CM

## 2021-06-16 NOTE — Patient Instructions (Signed)
You were seen today in the [facility] for wellness visit . Labs were collected, results will be available via MyChart or, if abnormal, you will be contacted by clinic staff. You were prescribed medications, please take as directed. Please follow up in 6 mths for reevaluation and wellness visit  ?

## 2021-06-16 NOTE — Progress Notes (Signed)
? ?[name] [name] ?[place], [place]  [address] ?Phone:  [phone]   Fax:  [phone] ?Subjective:  ? Patient ID: [name] [name], male    DOB: 06-15-1955, 66 y.o.   MRN: [id number] ? ?Chief Complaint  ?Patient presents with  ? Follow-up  ?  Pt is here for 6 months follow up. Pt stated right ear hurts unable to hear out of it.  ? ?HPI ?[name] [name] 66 y.o. 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) (05/31/11), Renal insufficiency (2012), Skin rash (01/2019), Smoking (06/12/11), Status post chemotherapy (10/30/10 - 11/09/10), Status post radiation therapy (10/11/10 - 12/24/10), and Xerostomia. To the [facility] for follow up visit. ? ?States that since last visit, he has been back and forth to the hospital and appointments for clot in right arm. Went to the ED in January for right arm pain and discoloration. Since being discharged, symptoms have resolved, currently on blood thinners.  ? ?When questioned about tobacco abuse, states that he is down to 10 cigarettes/ per day from 2 packs per day. States, " I wish they had a shot to make me quit." Has upcoming appointment with surgeon for reevaluation of left lung nodule.  ? ?Concerned about loss of hearing in right ear. States that he started losing hearing 6-7 yrs ago after receiving radiation. Has had pain and drainage from right ear for several years. Pain has recently increased. Questioning if he can have a referral for hearing aide. Drainage from ear orange and foul smelling. Has had symptoms in the past, it typically resolves without intervention.  ? ?Denies any other concerns today. Denies any fatigue, chest pain, shortness of breath, HA or dizziness.  Denies any blurred vision, numbness or tingling. ? ?Past Medical History:  ?Diagnosis Date  ? Arthritis   ? Chronic right ear pain 07/2019  ? Coronary artery disease   ? Depression 06/12/11  ? Buproprion per Dr. Lamonte Sakai  ? Drainage from ear, right 07/2019  ? Dyspnea   ? Full dentures   ? Fitting Per Dr. Enrique Sack  ? Heart murmur   ? History of kidney stones   ? Hypertension   ? Hypothyroidism   ? Oropharyngeal cancer ([place]) 2012  ? throat  ? Protein calorie malnutrition ([place]) 05/31/11  ? Renal insufficiency 2012  ? Secondary to Hx. of Cisplatin and Dhydrtion  ? Skin rash 01/2019  ? Smoking 06/12/11  ? 1/2 pack/day  ? Status post chemotherapy 10/30/10 - 11/09/10  ?  2 doses of Q 3 week Cisplatin  ? Status post radiation therapy 10/11/10 - 12/24/10  ? Bilateral Neck and Mucosa axis /  70 gray in 35 fractions  ? Xerostomia   ? ? ?Past Surgical History:  ?Procedure Laterality Date  ? APPENDECTOMY    ? BIOPSY TONGUE    ? TonsilandBaseoftongue  ? HERNIA REPAIR    ? umbilical with mesh  ? IR NEPHROSTOMY EXCHANGE LEFT  01/10/2017  ? IR NEPHROSTOMY PLACEMENT LEFT  11/15/2016  ? LEFT HEART CATH AND CORONARY ANGIOGRAPHY N/A 01/18/2017  ? Procedure: LEFT HEART CATH AND CORONARY ANGIOGRAPHY;  Surgeon: [name], MD;  Location: [place];  Service: Cardiovascular;  Laterality: N/A;  ? NEPHROLITHOTOMY Left 02/11/2017  ? Procedure: NEPHROLITHOTOMY PERCUTANEOUS;  Surgeon: [name], MD;  Location: [facility];  Service: Urology;  Laterality: Left;  ? PEG TUBE REMOVAL  04/13/11  ? PERIPHERAL VASCULAR INTERVENTION Right 03/05/2021  ? Procedure: PERIPHERAL VASCULAR INTERVENTION;  Surgeon: Serafina Mitchell, MD;  Location: [place] [facility];  Service: Cardiovascular;  Laterality: Right;  right subclavian  ? THROMBECTOMY BRACHIAL ARTERY Right 03/02/2021  ? Procedure: THROMBECTOMY RIGHT SUBCLAVIAN, BRACHIAL, AXILLA, AND RADIAL ARTERY;  Surgeon: Marty Heck, MD;  Location: [facility];  Service: Vascular;  Laterality: Right;  ? TONSILLECTOMY     ? right side  ? UPPER EXTREMITY ANGIOGRAPHY N/A 03/05/2021  ? Procedure: UPPER EXTREMITY ANGIOGRAPHY;  Surgeon: Serafina Mitchell, MD;  Location: [place];  Service: Cardiovascular;  Laterality: N/A;  ? ? ?Family History  ?Problem Relation Age of Onset  ? Cancer Father 64  ?     colon  ? Cancer Sister 57  ?     leukemia  ? ? ?Social History  ? ?Socioeconomic History  ? Marital status: Single  ?  Spouse name: Not on file  ? Number of children: Not on file  ? Years of education: Not on file  ? Highest education level: Not on file  ?Occupational History  ? Not on file  ?Tobacco Use  ? Smoking status: Every Day  ?  Packs/day: 1.50  ?  Years: 55.00  ?  Pack years: 82.50  ?  Types: Cigarettes  ? Smokeless tobacco: Never  ? Tobacco comments:  ?  10 cigarettes smoked daily ARJ 06/03/21  ?Vaping Use  ? Vaping Use: Never used  ?Substance and Sexual Activity  ? Alcohol use: No  ?  Alcohol/week: 0.0 standard drinks  ? Drug use: No  ? Sexual activity: Not Currently  ?Other Topics Concern  ? Not on file  ?Social History Narrative  ? Not on file  ? ?Social Determinants of Health  ? ?Financial Resource Strain: Low Risk   ? Difficulty of Paying Living Expenses: Not hard at all  ?Food Insecurity: No Food Insecurity  ? Worried About Charity fundraiser in the Last Year: Never true  ? Ran Out of Food in the Last Year: Never true  ?Transportation Needs: No Transportation Needs  ? Lack of Transportation (Medical): No  ? Lack of Transportation (Non-Medical): No  ?Physical Activity: Inactive  ? Days of Exercise per Week: 0 days  ? Minutes of Exercise per Session: 0 min  ?Stress: Stress Concern Present  ? Feeling of Stress : To some extent  ?Social Connections: Socially Isolated  ? Frequency of Communication with Friends and Family: More than three times a week  ? Frequency of Social Gatherings with Friends and Family: Twice a week  ? Attends Religious Services: Never  ? Active Member of Clubs or Organizations: No  ? Attends Theatre manager Meetings: Not on file  ? Marital Status: Widowed  ?Intimate Partner Violence: Not At Risk  ? Fear of Current or Ex-Partner: No  ? Emotionally Abused: No  ? Physically Abused: No  ? Sexually Abused: No  ? ? ?Outpatient Medications Prior to Visit  ?Medication Sig Dispense Refill  ? albuterol (VENTOLIN HFA) 108 (90 Base) MCG/ACT inhaler INHALE 2 PUFFS INTO THE LUNGS EVERY 4 (FOUR) HOURS AS NEEDED FOR WHEEZING OR SHORTNESS OF BREATH (COUGH, SHORTNESS OF BREATH OR WHEEZING.). (Patient taking differently: 2 puffs every 6 (six) hours as needed for shortness of breath.) 18 g 11  ? amLODipine (NORVASC) 10 MG  tablet TAKE 1 TABLET (10 MG TOTAL) BY MOUTH DAILY. (Patient taking differently: Take 10 mg by mouth daily.) 90 tablet 3  ? aspirin EC 81 MG EC tablet Take 1 tablet (81 mg total) by mouth daily. Swallow whole. 30 tablet 11  ? atorvastatin (LIPITOR) 40 MG tablet TAKE 1 TABLET (40 MG TOTAL) BY MOUTH DAILY. (Patient taking differently: Take 40 mg by mouth daily.) 90 tablet 3  ? Budeson-Glycopyrrol-Formoterol (BREZTRI AEROSPHERE) 160-9-4.8 MCG/ACT AERO Inhale 2 puffs into the lungs in the morning and at bedtime. 5.9 g 0  ? budesonide-formoterol (SYMBICORT) 80-4.5 MCG/ACT inhaler INHALE 2 PUFFS INTO THE LUNGS 2 (TWO) TIMES DAILY. 10.2 g 11  ? clopidogrel (PLAVIX) 75 MG tablet Take 1 tablet (75 mg total) by mouth daily. 30 tablet 6  ? fluticasone (VERAMYST) 27.5 MCG/SPRAY nasal spray Place 2 sprays into the nose daily as needed for rhinitis.    ? hydrALAZINE (APRESOLINE) 25 MG tablet TAKE 2 TABLETS (50 MG TOTAL) BY MOUTH EVERY 8 (EIGHT) HOURS. (Patient taking differently: Take 50 mg by mouth every 8 (eight) hours.) 180 tablet 3  ? isosorbide mononitrate (IMDUR) 30 MG 24 hr tablet TAKE 1 TABLET (30 MG TOTAL) BY MOUTH DAILY. (Patient taking differently: Take 30 mg by mouth daily.) 90 tablet 3  ? levothyroxine (SYNTHROID) 112 MCG tablet Take 1 tablet (112 mcg total) by mouth daily. 90 tablet 3  ? metoprolol tartrate  (LOPRESSOR) 25 MG tablet TAKE 1 TABLET (25 MG TOTAL) BY MOUTH 2 (TWO) TIMES DAILY. (Patient taking differently: Take 25 mg by mouth 2 (two) times daily.) 180 tablet 3  ? mirtazapine (REMERON SOL-TAB) 15 M

## 2021-06-17 LAB — CBC WITH DIFFERENTIAL/PLATELET
Basophils Absolute: 0.1 10*3/uL (ref 0.0–0.2)
Basos: 1 %
EOS (ABSOLUTE): 0.2 10*3/uL (ref 0.0–0.4)
Eos: 2 %
Hematocrit: 32.2 % — ABNORMAL LOW (ref 37.5–51.0)
Hemoglobin: 9.5 g/dL — ABNORMAL LOW (ref 13.0–17.7)
Immature Grans (Abs): 0.1 10*3/uL (ref 0.0–0.1)
Immature Granulocytes: 1 %
Lymphocytes Absolute: 1 10*3/uL (ref 0.7–3.1)
Lymphs: 11 %
MCH: 20.8 pg — ABNORMAL LOW (ref 26.6–33.0)
MCHC: 29.5 g/dL — ABNORMAL LOW (ref 31.5–35.7)
MCV: 71 fL — ABNORMAL LOW (ref 79–97)
Monocytes Absolute: 0.7 10*3/uL (ref 0.1–0.9)
Monocytes: 8 %
Neutrophils Absolute: 6.9 10*3/uL (ref 1.4–7.0)
Neutrophils: 77 %
Platelets: 268 10*3/uL (ref 150–450)
RBC: 4.57 x10E6/uL (ref 4.14–5.80)
RDW: 16.2 % — ABNORMAL HIGH (ref 11.6–15.4)
WBC: 8.9 10*3/uL (ref 3.4–10.8)

## 2021-06-17 LAB — COMPREHENSIVE METABOLIC PANEL
ALT: 18 IU/L (ref 0–44)
AST: 11 IU/L (ref 0–40)
Albumin/Globulin Ratio: 1.6 (ref 1.2–2.2)
Albumin: 3.7 g/dL — ABNORMAL LOW (ref 3.8–4.8)
Alkaline Phosphatase: 103 IU/L (ref 44–121)
BUN/Creatinine Ratio: 8 — ABNORMAL LOW (ref 10–24)
BUN: 14 mg/dL (ref 8–27)
Bilirubin Total: <0.2 mg/dL (ref 0.0–1.2)
CO2: 18 mmol/L — ABNORMAL LOW (ref 20–29)
Calcium: 9 mg/dL (ref 8.6–10.2)
Chloride: 109 mmol/L — ABNORMAL HIGH (ref 96–106)
Creatinine, Ser: 1.74 mg/dL — ABNORMAL HIGH (ref 0.76–1.27)
Globulin, Total: 2.3 g/dL (ref 1.5–4.5)
Glucose: 94 mg/dL (ref 70–99)
Potassium: 4.3 mmol/L (ref 3.5–5.2)
Sodium: 142 mmol/L (ref 134–144)
Total Protein: 6 g/dL (ref 6.0–8.5)
eGFR: 43 mL/min/{1.73_m2} — ABNORMAL LOW (ref >59)

## 2021-06-17 LAB — HEMOGLOBIN A1C
Est. average glucose Bld gHb Est-mCnc: 105 mg/dL
Hgb A1c MFr Bld: 5.3 % (ref 4.8–5.6)

## 2021-06-17 LAB — LIPID PANEL
Chol/HDL Ratio: 3.7 ratio (ref 0.0–5.0)
Cholesterol, Total: 96 mg/dL — ABNORMAL LOW (ref 100–199)
HDL: 26 mg/dL — ABNORMAL LOW (ref >39)
LDL Chol Calc (NIH): 52 mg/dL (ref 0–99)
Triglycerides: 92 mg/dL (ref 0–149)
VLDL Cholesterol Cal: 18 mg/dL (ref 5–40)

## 2021-06-17 LAB — TSH+T4F+T3FREE
Free T4: 1.41 ng/dL (ref 0.82–1.77)
T3, Free: 2 pg/mL (ref 2.0–4.4)
TSH: 2.91 u[IU]/mL (ref 0.450–4.500)

## 2021-06-22 ENCOUNTER — Ambulatory Visit: Payer: Medicare Other | Attending: Audiologist | Admitting: Audiologist

## 2021-06-22 ENCOUNTER — Other Ambulatory Visit: Payer: Self-pay | Admitting: Nurse Practitioner

## 2021-06-22 ENCOUNTER — Other Ambulatory Visit: Payer: Self-pay

## 2021-06-22 DIAGNOSIS — I10 Essential (primary) hypertension: Secondary | ICD-10-CM

## 2021-06-22 MED ORDER — HYDRALAZINE HCL 25 MG PO TABS
ORAL_TABLET | ORAL | 3 refills | Status: DC
  Filled 2021-06-22: qty 180, 30d supply, fill #0
  Filled 2021-08-10: qty 180, 30d supply, fill #1
  Filled 2021-09-23: qty 180, 30d supply, fill #2
  Filled 2021-11-12: qty 180, 30d supply, fill #3

## 2021-06-24 NOTE — Progress Notes (Signed)
? ?   ?[address] ?      [place] [address] ?            [phone]   ?                 ?[name] [name] ?[place] [facility] Record #[id number] ?Date of Birth: 01/09/1956 ? ?Referring: Collene Gobble, MD ?Primary Care: Vevelyn Francois, NP ?Primary Cardiologist: Shelva Majestic, MD ? ?Chief Complaint:    ?Chief Complaint  ?Patient presents with  ? Lung Lesion  ?  Surgical consult, Chest CT 03/25/21, PET Scan 04/10/21, PFT's 05/15/21  ? ? ?History of Present Illness:    ?[name] [name] 66 y.o. male referred by Dr. Lamonte Sakai for surgical evaluation of a 1.9 cm left lower lobe pulmonary nodule.  He has a significant smoking history and continues to smoke about half a pack a day.  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  He has been followed via lung cancer screening program and was noted to have this lesion back in January 2023.  He subsequently underwent a PET/CT which showed avidity within the nodule. ? ?He denies any neurologic symptoms.  He occasionally has some shortness of breath.  In 2018 he did undergo a left heart catheterization which showed disease in his LAD.  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  He is currently on Plavix and aspirin as well. ?  ? ?Smoking Hx: ?Continues to smoke half a pack a day but is willing to quit. ? ? ?Zubrod Score: ?At the time of surgery this patient?s most appropriate activity status/level should be described as: ?[x]     0    Normal activity, no symptoms ?[]     1    Restricted in physical strenuous activity but ambulatory, able to do out light work ?[]     2    Ambulatory and capable of self care, unable to do work activities, up and about               >50 % of waking hours                              ?[]     3    Only limited self care, in bed greater than 50% of waking hours ?[]     4    Completely disabled, no self care, confined to bed or  chair ?[]     5    Moribund ? ? ?Past Medical History:  ?Diagnosis Date  ? Arthritis   ? Chronic right ear pain 07/2019  ? Coronary artery disease   ? Depression 06/12/11  ? Buproprion per Dr. Lamonte Sakai  ? Drainage from ear, right 07/2019  ? Dyspnea   ? Full dentures   ? Fitting Per Dr. Enrique Sack  ? Heart murmur   ? History of kidney stones   ? Hypertension   ? Hypothyroidism   ? Oropharyngeal cancer ([facility]) 2012  ? throat  ? Protein calorie malnutrition ([facility]) 05/31/11  ? Renal insufficiency 2012  ? Secondary to Hx. of Cisplatin and Dhydrtion  ? Skin rash 01/2019  ? Smoking 06/12/11  ? 1/2 pack/day  ? Status post chemotherapy 10/30/10 - 11/09/10  ?  2 doses of Q 3 week Cisplatin  ? Status post radiation therapy 10/11/10 -  12/24/10  ? Bilateral Neck and Mucosa axis /  70 gray in 35 fractions  ? Xerostomia   ? ? ?Past Surgical History:  ?Procedure Laterality Date  ? APPENDECTOMY    ? BIOPSY TONGUE    ? TonsilandBaseoftongue  ? HERNIA REPAIR    ? umbilical with mesh  ? IR NEPHROSTOMY EXCHANGE LEFT  01/10/2017  ? IR NEPHROSTOMY PLACEMENT LEFT  11/15/2016  ? LEFT HEART CATH AND CORONARY ANGIOGRAPHY N/A 01/18/2017  ? Procedure: LEFT HEART CATH AND CORONARY ANGIOGRAPHY;  Surgeon: [name], MD;  Location: [facility];  Service: Cardiovascular;  Laterality: N/A;  ? NEPHROLITHOTOMY Left 02/11/2017  ? Procedure: NEPHROLITHOTOMY PERCUTANEOUS;  Surgeon: [name], MD;  Location: [facility];  Service: Urology;  Laterality: Left;  ? PEG TUBE REMOVAL  04/13/11  ? PERIPHERAL VASCULAR INTERVENTION Right 03/05/2021  ? Procedure: PERIPHERAL VASCULAR INTERVENTION;  Surgeon: Serafina Mitchell, MD;  Location: [facility];  Service: Cardiovascular;  Laterality: Right;  right subclavian  ? THROMBECTOMY BRACHIAL ARTERY Right 03/02/2021  ? Procedure: THROMBECTOMY RIGHT SUBCLAVIAN, BRACHIAL, AXILLA, AND RADIAL ARTERY;  Surgeon: Marty Heck, MD;  Location: [facility];  Service: Vascular;  Laterality: Right;  ? TONSILLECTOMY    ? right side  ?  UPPER EXTREMITY ANGIOGRAPHY N/A 03/05/2021  ? Procedure: UPPER EXTREMITY ANGIOGRAPHY;  Surgeon: Serafina Mitchell, MD;  Location: [facility];  Service: Cardiovascular;  Laterality: N/A;  ? ? ?Family History  ?Problem Relation Age of Onset  ? Cancer Father 47  ?     colon  ? Cancer Sister 55  ?     leukemia  ? ? ? ?Social History  ? ?Tobacco Use  ?Smoking Status Every Day  ? Packs/day: 1.50  ? Years: 55.00  ? Pack years: 82.50  ? Types: Cigarettes  ?Smokeless Tobacco Never  ?Tobacco Comments  ? 10 cigarettes smoked daily ARJ 06/03/21  ?  ?Social History  ? ?Substance and Sexual Activity  ?Alcohol Use No  ? Alcohol/week: 0.0 standard drinks  ? ? ? ?Allergies  ?Allergen Reactions  ? Codeine Nausea Only  ? ? ?Current Outpatient Medications  ?Medication Sig Dispense Refill  ? albuterol (VENTOLIN HFA) 108 (90 Base) MCG/ACT inhaler INHALE 2 PUFFS INTO THE LUNGS EVERY 4 (FOUR) HOURS AS NEEDED FOR WHEEZING OR SHORTNESS OF BREATH (COUGH, SHORTNESS OF BREATH OR WHEEZING.). (Patient taking differently: 2 puffs every 6 (six) hours as needed for shortness of breath.) 18 g 11  ? aspirin EC 81 MG EC tablet Take 1 tablet (81 mg total) by mouth daily. Swallow whole. 30 tablet 11  ? atorvastatin (LIPITOR) 40 MG tablet TAKE 1 TABLET (40 MG TOTAL) BY MOUTH DAILY. (Patient taking differently: Take 40 mg by mouth daily.) 90 tablet 3  ? Budeson-Glycopyrrol-Formoterol (BREZTRI AEROSPHERE) 160-9-4.8 MCG/ACT AERO Inhale 2 puffs into the lungs in the morning and at bedtime. 5.9 g 0  ? budesonide-formoterol (SYMBICORT) 80-4.5 MCG/ACT inhaler INHALE 2 PUFFS INTO THE LUNGS 2 (TWO) TIMES DAILY. 10.2 g 11  ? clopidogrel (PLAVIX) 75 MG tablet Take 1 tablet (75 mg total) by mouth daily. 30 tablet 6  ? docusate sodium (COLACE) 100 MG capsule Take 100 mg by mouth daily as needed for mild constipation.    ? fluticasone (VERAMYST) 27.5 MCG/SPRAY nasal spray Place 2 sprays into the nose daily as needed for rhinitis.    ? isosorbide mononitrate (IMDUR)  30 MG 24 hr tablet TAKE 1 TABLET (30 MG TOTAL) BY MOUTH  DAILY. (Patient taking differently: Take 30 mg by mouth daily.) 90 tablet 3  ? levothyroxine (SYNTHROID) 112 MCG tablet Take 1 tablet (112 mcg total) by mouth daily. 90 tablet 3  ? mirtazapine (REMERON SOL-TAB) 15 MG disintegrating tablet Take 1 tablet (15 mg total) by mouth at bedtime. 90 tablet 3  ? tamsulosin (FLOMAX) 0.4 MG CAPS capsule Take 1 capsule (0.4 mg total) by mouth daily. 90 capsule 3  ? Vitamin D, Ergocalciferol, (DRISDOL) 1.25 MG (50000 UNIT) CAPS capsule TAKE 1 CAPSULE (50,000 UNITS TOTAL) BY MOUTH EVERY 7 (SEVEN) DAYS. 5 capsule 3  ? amLODipine (NORVASC) 10 MG tablet TAKE 1 TABLET (10 MG TOTAL) BY MOUTH DAILY. (Patient not taking: Reported on 06/26/2021) 90 tablet 3  ? hydrALAZINE (APRESOLINE) 25 MG tablet TAKE 2 TABLETS (50 MG TOTAL) BY MOUTH EVERY 8 (EIGHT) HOURS. (Patient not taking: Reported on 06/26/2021) 180 tablet 3  ? metoprolol tartrate (LOPRESSOR) 25 MG tablet TAKE 1 TABLET (25 MG TOTAL) BY MOUTH 2 (TWO) TIMES DAILY. (Patient not taking: Reported on 06/26/2021) 180 tablet 3  ? ?No current facility-administered medications for this visit.  ? ? ?Review of Systems  ?Constitutional:  Negative for fever, malaise/fatigue and weight loss.  ?HENT:  Positive for sore throat.   ?Respiratory:  Positive for sputum production.   ?Cardiovascular:  Positive for chest pain.  ?Neurological:  Negative for dizziness and headaches.  ? ? ?PHYSICAL EXAMINATION: ?BP (!) 112/56   Pulse 62   Resp 20   Ht 5\' 9"  (1.753 m)   Wt 170 lb (77.1 kg)   SpO2 98% Comment: RA  BMI 25.10 kg/m?  ?Physical Exam ?Constitutional:   ?   General: He is not in acute distress. ?   Appearance: He is normal weight.  ?HENT:  ?   Head: Normocephalic and atraumatic.  ?Eyes:  ?   Extraocular Movements: Extraocular movements intact.  ?Cardiovascular:  ?   Rate and Rhythm: Normal rate.  ?Pulmonary:  ?   Effort: Pulmonary effort is normal. No respiratory distress.  ?Abdominal:  ?    General: Abdomen is flat. There is no distension.  ?Musculoskeletal:     ?   General: Normal range of motion.  ?Skin: ?   General: Skin is warm and dry.  ?Neurological:  ?   General: No focal deficit present.

## 2021-06-24 NOTE — H&P (View-Only) (Signed)
[address]       [place] [address]             [phone]                    [name] [name] [facility] Medical Record #[id number] Date of Birth: October 03, 1955  Referring: Collene Gobble, MD Primary Care: Vevelyn Francois, NP Primary Cardiologist: Shelva Majestic, MD  Chief Complaint:    Chief Complaint  Patient presents with   Lung Lesion    Surgical consult, Chest CT 03/25/21, PET Scan 04/10/21, PFT's 05/15/21    History of Present Illness:    [name] [name] 66 y.o. male referred by Dr. Lamonte Sakai for surgical evaluation of a 1.9 cm left lower lobe pulmonary nodule.  He has a significant smoking history and continues to smoke about half a pack a day.  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  He has been followed via lung cancer screening program and was noted to have this lesion back in January 2023.  He subsequently underwent a PET/CT which showed avidity within the nodule.  He denies any neurologic symptoms.  He occasionally has some shortness of breath.  In 2018 he did undergo a left heart catheterization which showed disease in his LAD.  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  He is currently on Plavix and aspirin as well.    Smoking Hx: Continues to smoke half a pack a day but is willing to quit.   Zubrod Score: At the time of surgery this patient's most appropriate activity status/level should be described as: [x]     0    Normal activity, no symptoms []     1    Restricted in physical strenuous activity but ambulatory, able to do out light work []     2    Ambulatory and capable of self care, unable to do work activities, up and about               >50 % of waking hours                              []     3    Only limited self care, in bed greater than 50% of waking hours []     4    Completely disabled, no self care, confined to bed or  chair []     5    Moribund   Past Medical History:  Diagnosis Date   Arthritis    Chronic right ear pain 07/2019   Coronary artery disease    Depression 06/12/11   Buproprion per Dr. Lamonte Sakai   Drainage from ear, right 07/2019   Dyspnea    Full dentures    Fitting Per Dr. 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 of Cisplatin and Dhydrtion   Skin rash 01/2019   Smoking 06/12/11   1/2 pack/day   Status post chemotherapy 10/30/10 - 11/09/10    2 doses of Q 3 week Cisplatin   Status post radiation therapy 10/11/10 -  12/24/10   Bilateral Neck and Mucosa axis /  70 gray in 35 fractions   Xerostomia     Past Surgical History:  Procedure Laterality Date   APPENDECTOMY     BIOPSY TONGUE     TonsilandBaseoftongue   HERNIA REPAIR     umbilical with mesh   IR NEPHROSTOMY EXCHANGE LEFT  01/10/2017   IR NEPHROSTOMY PLACEMENT LEFT  11/15/2016   LEFT HEART CATH AND CORONARY ANGIOGRAPHY N/A 01/18/2017   Procedure: LEFT HEART CATH AND CORONARY ANGIOGRAPHY;  Surgeon: [name], MD;  Location: [facility];  Service: Cardiovascular;  Laterality: N/A;   NEPHROLITHOTOMY Left 02/11/2017   Procedure: NEPHROLITHOTOMY PERCUTANEOUS;  Surgeon: [name], MD;  [facility];  Service: Urology;  Laterality: Left;   PEG TUBE REMOVAL  04/13/11   PERIPHERAL VASCULAR INTERVENTION Right 03/05/2021   Procedure: PERIPHERAL VASCULAR INTERVENTION;  Surgeon: Serafina Mitchell, MD;  Location: [facility];  Service: Cardiovascular;  Laterality: Right;  right subclavian   THROMBECTOMY BRACHIAL ARTERY Right 03/02/2021   Procedure: THROMBECTOMY RIGHT SUBCLAVIAN, BRACHIAL, AXILLA, AND RADIAL ARTERY;  Surgeon: Marty Heck, MD;  Location: [facility];  Service: Vascular;  Laterality: Right;   TONSILLECTOMY     right side    UPPER EXTREMITY ANGIOGRAPHY N/A 03/05/2021   Procedure: UPPER EXTREMITY ANGIOGRAPHY;  Surgeon: Serafina Mitchell, MD;  Location: [facility];  Service: Cardiovascular;  Laterality: N/A;    Family History  Problem Relation Age of Onset   Cancer Father 68       colon   Cancer Sister 64       leukemia     Social History   Tobacco Use  Smoking Status Every Day   Packs/day: 1.50   Years: 55.00   Pack years: 82.50   Types: Cigarettes  Smokeless Tobacco Never  Tobacco Comments   10 cigarettes smoked daily ARJ 06/03/21    Social History   Substance and Sexual Activity  Alcohol Use No   Alcohol/week: 0.0 standard drinks     Allergies  Allergen Reactions   Codeine Nausea Only    Current Outpatient Medications  Medication Sig Dispense Refill   albuterol (VENTOLIN HFA) 108 (90 Base) MCG/ACT inhaler INHALE 2 PUFFS INTO THE LUNGS EVERY 4 (FOUR) HOURS AS NEEDED FOR WHEEZING OR SHORTNESS OF BREATH (COUGH, SHORTNESS OF BREATH OR WHEEZING.). (Patient taking differently: 2 puffs every 6 (six) hours as needed for shortness of breath.) 18 g 11   aspirin EC 81 MG EC tablet Take 1 tablet (81 mg total) by mouth daily. Swallow whole. 30 tablet 11   atorvastatin (LIPITOR) 40 MG tablet TAKE 1 TABLET (40 MG TOTAL) BY MOUTH DAILY. (Patient taking differently: Take 40 mg by mouth daily.) 90 tablet 3   Budeson-Glycopyrrol-Formoterol (BREZTRI AEROSPHERE) 160-9-4.8 MCG/ACT AERO Inhale 2 puffs into the lungs in the morning and at bedtime. 5.9 g 0   budesonide-formoterol (SYMBICORT) 80-4.5 MCG/ACT inhaler INHALE 2 PUFFS INTO THE LUNGS 2 (TWO) TIMES DAILY. 10.2 g 11   clopidogrel (PLAVIX) 75 MG tablet Take 1 tablet (75 mg total) by mouth daily. 30 tablet 6   docusate sodium (COLACE) 100 MG capsule Take 100 mg by mouth daily as needed for mild constipation.     fluticasone (VERAMYST) 27.5 MCG/SPRAY nasal spray Place 2 sprays into the nose daily as needed for rhinitis.     isosorbide mononitrate (IMDUR)  30 MG 24 hr tablet TAKE 1 TABLET (30 MG TOTAL) BY MOUTH  DAILY. (Patient taking differently: Take 30 mg by mouth daily.) 90 tablet 3   levothyroxine (SYNTHROID) 112 MCG tablet Take 1 tablet (112 mcg total) by mouth daily. 90 tablet 3   mirtazapine (REMERON SOL-TAB) 15 MG disintegrating tablet Take 1 tablet (15 mg total) by mouth at bedtime. 90 tablet 3   tamsulosin (FLOMAX) 0.4 MG CAPS capsule Take 1 capsule (0.4 mg total) by mouth daily. 90 capsule 3   Vitamin D, Ergocalciferol, (DRISDOL) 1.25 MG (50000 UNIT) CAPS capsule TAKE 1 CAPSULE (50,000 UNITS TOTAL) BY MOUTH EVERY 7 (SEVEN) DAYS. 5 capsule 3   amLODipine (NORVASC) 10 MG tablet TAKE 1 TABLET (10 MG TOTAL) BY MOUTH DAILY. (Patient not taking: Reported on 06/26/2021) 90 tablet 3   hydrALAZINE (APRESOLINE) 25 MG tablet TAKE 2 TABLETS (50 MG TOTAL) BY MOUTH EVERY 8 (EIGHT) HOURS. (Patient not taking: Reported on 06/26/2021) 180 tablet 3   metoprolol tartrate (LOPRESSOR) 25 MG tablet TAKE 1 TABLET (25 MG TOTAL) BY MOUTH 2 (TWO) TIMES DAILY. (Patient not taking: Reported on 06/26/2021) 180 tablet 3   No current facility-administered medications for this visit.    Review of Systems  Constitutional:  Negative for fever, malaise/fatigue and weight loss.  HENT:  Positive for sore throat.   Respiratory:  Positive for sputum production.   Cardiovascular:  Positive for chest pain.  Neurological:  Negative for dizziness and headaches.    PHYSICAL EXAMINATION: BP (!) 112/56   Pulse 62   Resp 20   Ht 5\' 9"  (1.753 m)   Wt 170 lb (77.1 kg)   SpO2 98% Comment: RA  BMI 25.10 kg/m  Physical Exam Constitutional:      General: He is not in acute distress.    Appearance: He is normal weight.  HENT:     Head: Normocephalic and atraumatic.  Eyes:     Extraocular Movements: Extraocular movements intact.  Cardiovascular:     Rate and Rhythm: Normal rate.  Pulmonary:     Effort: Pulmonary effort is normal. No respiratory distress.  Abdominal:      General: Abdomen is flat. There is no distension.  Musculoskeletal:        General: Normal range of motion.  Skin:    General: Skin is warm and dry.  Neurological:     General: No focal deficit present.     Mental Status: He is alert.    Diagnostic Studies & Laboratory data:     Recent Radiology Findings:   No results found.     I have independently reviewed the above radiology studies  and reviewed the findings with the patient.   Recent Lab Findings: Lab Results  Component Value Date   WBC 8.9 06/16/2021   HGB 9.5 (L) 06/16/2021   HCT 32.2 (L) 06/16/2021   PLT 268 06/16/2021   GLUCOSE 94 06/16/2021   CHOL 96 (L) 06/16/2021   TRIG 92 06/16/2021   HDL 26 (L) 06/16/2021   LDLCALC 52 06/16/2021   ALT 18 06/16/2021   AST 11 06/16/2021   NA 142 06/16/2021   K 4.3 06/16/2021   CL 109 (H) 06/16/2021   CREATININE 1.74 (H) 06/16/2021   BUN 14 06/16/2021   CO2 18 (L) 06/16/2021   TSH 2.910 06/16/2021   INR 1.0 01/11/2017   HGBA1C 5.3 06/16/2021     PFTs:  - FVC: 86% - FEV1: 76% -DLCO: 60%  Problem List: 1.9cm pulm nodule with SUV 12.9 CAD with LAD stenosis seen in 2018  Assessment / Plan:   66 year old male with significant smoking history, history of oropharyngeal cancer treated with chemotherapy and radiation, coronary artery disease, and ongoing smoking presents with a 1.9 cm left lower lobe pulmonary nodule concerning for primary lung cancer.  I stressed the importance of smoking cessation patient states that he would rather stop smoking as opposed to undergoing another radiation therapy treatment.  He states that today will be his last day.  He will require stress test given his coronary history.  Will speak with Dr. Lamonte Sakai about performing a combined single anesthetic biopsy, marking, and surgical resection.  This will need to be done 2 weeks out from his last cigarette.      I  spent 40 minutes with  the patient face to face in counseling and  coordination of care.    [name] 06/26/2021 10:41 AM

## 2021-06-26 ENCOUNTER — Other Ambulatory Visit: Payer: Self-pay | Admitting: Thoracic Surgery (Cardiothoracic Vascular Surgery)

## 2021-06-26 ENCOUNTER — Other Ambulatory Visit: Payer: Self-pay

## 2021-06-26 ENCOUNTER — Institutional Professional Consult (permissible substitution): Payer: Medicare Other | Admitting: Thoracic Surgery (Cardiothoracic Vascular Surgery)

## 2021-06-26 VITALS — BP 112/56 | HR 62 | Resp 20 | Ht 69.0 in | Wt 170.0 lb

## 2021-06-26 DIAGNOSIS — R911 Solitary pulmonary nodule: Secondary | ICD-10-CM

## 2021-06-29 ENCOUNTER — Other Ambulatory Visit: Payer: Self-pay | Admitting: Thoracic Surgery (Cardiothoracic Vascular Surgery)

## 2021-06-29 ENCOUNTER — Other Ambulatory Visit: Payer: Self-pay | Admitting: Emergency Medicine

## 2021-06-29 ENCOUNTER — Encounter: Payer: Self-pay | Admitting: *Deleted

## 2021-06-29 ENCOUNTER — Other Ambulatory Visit: Payer: Self-pay | Admitting: *Deleted

## 2021-06-29 DIAGNOSIS — R911 Solitary pulmonary nodule: Secondary | ICD-10-CM

## 2021-06-29 NOTE — Progress Notes (Signed)
Will arrange navigational bronchoscopy to be done as a combination case, VATS resection by DR Kipp Brood ?

## 2021-07-03 ENCOUNTER — Other Ambulatory Visit: Payer: Self-pay

## 2021-07-03 ENCOUNTER — Encounter (HOSPITAL_COMMUNITY): Payer: Self-pay | Admitting: Thoracic Surgery (Cardiothoracic Vascular Surgery)

## 2021-07-03 ENCOUNTER — Encounter (HOSPITAL_COMMUNITY): Payer: Self-pay

## 2021-07-06 ENCOUNTER — Other Ambulatory Visit: Payer: Self-pay

## 2021-07-07 ENCOUNTER — Telehealth (HOSPITAL_COMMUNITY): Payer: Self-pay

## 2021-07-07 NOTE — Telephone Encounter (Signed)
Detailed instructions left on the patient's answering machine. Asked to call back with any questions. [name] [name] METP ?

## 2021-07-09 ENCOUNTER — Ambulatory Visit (HOSPITAL_COMMUNITY): Payer: Medicare Other | Attending: Internal Medicine

## 2021-07-09 VITALS — Ht 69.0 in | Wt 171.0 lb

## 2021-07-09 DIAGNOSIS — Z01810 Encounter for preprocedural cardiovascular examination: Secondary | ICD-10-CM

## 2021-07-09 DIAGNOSIS — R911 Solitary pulmonary nodule: Secondary | ICD-10-CM | POA: Insufficient documentation

## 2021-07-09 DIAGNOSIS — R0602 Shortness of breath: Secondary | ICD-10-CM | POA: Insufficient documentation

## 2021-07-09 LAB — MYOCARDIAL PERFUSION IMAGING
LV dias vol: 109 mL (ref 62–150)
LV sys vol: 40 mL
Nuc Stress EF: 63 %
Peak HR: 78 {beats}/min
Rest HR: 50 {beats}/min
Rest Nuclear Isotope Dose: 10.2 mCi
SDS: 2
SRS: 0
SSS: 2
ST Depression (mm): 0 mm
Stress Nuclear Isotope Dose: 32.4 mCi
TID: 1

## 2021-07-09 MED ORDER — REGADENOSON 0.4 MG/5ML IV SOLN
0.4000 mg | Freq: Once | INTRAVENOUS | Status: AC
  Administered 2021-07-09: 0.4 mg via INTRAVENOUS

## 2021-07-09 MED ORDER — TECHNETIUM TC 99M TETROFOSMIN IV KIT
32.4000 | PACK | Freq: Once | INTRAVENOUS | Status: AC | PRN
  Administered 2021-07-09: 32.4 via INTRAVENOUS
  Filled 2021-07-09: qty 33

## 2021-07-09 MED ORDER — TECHNETIUM TC 99M TETROFOSMIN IV KIT
10.2000 | PACK | Freq: Once | INTRAVENOUS | Status: AC | PRN
  Administered 2021-07-09: 10.2 via INTRAVENOUS
  Filled 2021-07-09: qty 11

## 2021-07-15 NOTE — Progress Notes (Signed)
Surgical Instructions ? ? ? Your procedure is scheduled on Monday May 22nd. ? Report to [facility] Main Entrance "A" at 5:30 A.M., then check in with the Admitting office. ? Call this number if you have problems the morning of surgery: ? [phone] ? ? If you have any questions prior to your surgery date call [phone]: Open Monday-Friday 8am-4pm ? ? ? Remember: ? Do not eat or drink after midnight the night before your surgery ? ?  ? Take these medicines the morning of surgery with A SIP OF WATER: ?amLODipine (NORVASC) 10 MG tablet ?atorvastatin (LIPITOR) 40 MG tablet ?Budeson-Glycopyrrol-Formoterol (BREZTRI AEROSPHERE) 160-9-4.8 MCG/ACT AERO ?hydrALAZINE (APRESOLINE) 25 MG tablet ?isosorbide mononitrate (IMDUR) 30 MG 24 hr tablet ?levothyroxine (SYNTHROID) 112 MCG tablet ?metoprolol tartrate (LOPRESSOR) 25 MG tablet ?tamsulosin (FLOMAX) 0.4 MG CAPS capsule ? ?IF NEEDED ?albuterol (VENTOLIN HFA) 108 (90 Base) MCG/ACT inhaler- bring with you to the hospital ? ? ?Follow your surgeon's instructions on when to stop Plavix.  If no instructions were given by your surgeon then you will need to call the office to get those instructions.   ? ?Follow your surgeon's instructions on when to stop Aspirin.  If no instructions were given by your surgeon then you will need to call the office to get those instructions.   ? ? ?As of today, STOP taking any Aspirin (unless otherwise instructed by your surgeon) Aleve, Naproxen, Ibuprofen, Motrin, Advil, Goody's, BC's, all herbal medications, fish oil, and all vitamins. ? ?         ?Do not wear jewelry or makeup ?Do not wear lotions, powders, colognes, or deodorant. ?Do not shave 48 hours prior to surgery.  Men may shave face and neck. ?Do not bring valuables to the hospital. ?Do not wear nail polish ? ? ?[facility] is not responsible for any belongings or valuables. .  ? ?Do NOT Smoke (Tobacco/Vaping)  24 hours prior to your procedure ? ?If you use a CPAP at night, you may bring  your mask for your overnight stay. ?  ?Contacts, glasses, hearing aids, dentures or partials may not be worn into surgery, please bring cases for these belongings ?  ?For patients admitted to the hospital, discharge time will be determined by your treatment team. ?  ?Patients discharged the day of surgery will not be allowed to drive home, and someone needs to stay with them for 24 hours. ? ? ?SURGICAL WAITING ROOM VISITATION ?Patients having surgery or a procedure in a hospital may have two support people. ?Children under the age of 47 must have an adult with them who is not the patient. ?They may stay in the waiting area during the procedure and may switch out with other visitors. If the patient needs to stay at the hospital during part of their recovery, the visitor guidelines for inpatient rooms apply. ? ?Please refer to the [facility] website for the visitor guidelines for Inpatients (after your surgery is over and you are in a regular room).  ? ? ? ? ? ?Special instructions:   ? ?Oral Hygiene is also important to reduce your risk of infection.  Remember - BRUSH YOUR TEETH THE MORNING OF SURGERY WITH YOUR REGULAR TOOTHPASTE ? ? ?[facility]- Preparing For Surgery ? ?Before surgery, you can play an important role. Because skin is not sterile, your skin needs to be as free of germs as possible. You can reduce the number of germs on your skin by washing with CHG (chlorahexidine gluconate) Soap before surgery.  CHG is an antiseptic cleaner which kills germs and bonds with the skin to continue killing germs even after washing.   ? ? ?Please do not use if you have an allergy to CHG or antibacterial soaps. If your skin becomes reddened/irritated stop using the CHG.  ?Do not shave (including legs and underarms) for at least 48 hours prior to first CHG shower. It is OK to shave your face. ? ?Please follow these instructions carefully. ?  ? ? Shower the NIGHT BEFORE SURGERY and the MORNING OF SURGERY with CHG Soap.  ? If  you chose to wash your hair, wash your hair first as usual with your normal shampoo. After you shampoo, rinse your hair and body thoroughly to remove the shampoo.  Then [employer] and genitals (private parts) with your normal soap and rinse thoroughly to remove soap. ? ?After that Use CHG Soap as you would any other liquid soap. You can apply CHG directly to the skin and wash gently with a scrungie or a clean washcloth.  ? ?Apply the CHG Soap to your body ONLY FROM THE NECK DOWN.  Do not use on open wounds or open sores. Avoid contact with your eyes, ears, mouth and genitals (private parts). Wash Face and genitals (private parts)  with your normal soap.  ? ?Wash thoroughly, paying special attention to the area where your surgery will be performed. ? ?Thoroughly rinse your body with warm water from the neck down. ? ?DO NOT shower/wash with your normal soap after using and rinsing off the CHG Soap. ? ?Pat yourself dry with a CLEAN TOWEL. ? ?Wear CLEAN PAJAMAS to bed the night before surgery ? ?Place CLEAN SHEETS on your bed the night before your surgery ? ?DO NOT SLEEP WITH PETS. ? ? ?Day of Surgery: ? ?Take a shower with CHG soap. ?Wear Clean/Comfortable clothing the morning of surgery ?Do not apply any deodorants/lotions.   ?Remember to brush your teeth WITH YOUR REGULAR TOOTHPASTE. ? ? ? ?If you received a COVID test during your pre-op visit, it is requested that you wear a mask when out in public, stay away from anyone that may not be feeling well, and notify your surgeon if you develop symptoms. If you have been in contact with anyone that has tested positive in the last 10 days, please notify your surgeon. ? ?  ?Please read over the following fact sheets that you were given.  ? ?

## 2021-07-16 ENCOUNTER — Encounter (HOSPITAL_COMMUNITY): Payer: Self-pay

## 2021-07-16 ENCOUNTER — Other Ambulatory Visit: Payer: Self-pay

## 2021-07-16 ENCOUNTER — Encounter (HOSPITAL_COMMUNITY)
Admission: RE | Admit: 2021-07-16 | Discharge: 2021-07-16 | Disposition: A | Discharge status: Home / Self Care | Payer: Medicare Other | Source: Ambulatory Visit | Attending: Thoracic Surgery (Cardiothoracic Vascular Surgery) | Admitting: Thoracic Surgery (Cardiothoracic Vascular Surgery)

## 2021-07-16 ENCOUNTER — Ambulatory Visit (HOSPITAL_COMMUNITY)
Admission: RE | Admit: 2021-07-16 | Discharge: 2021-07-16 | Disposition: A | Discharge status: Home / Self Care | Payer: Medicare Other | Source: Ambulatory Visit | Attending: Thoracic Surgery (Cardiothoracic Vascular Surgery) | Admitting: Thoracic Surgery (Cardiothoracic Vascular Surgery)

## 2021-07-16 VITALS — BP 107/55 | HR 50 | Temp 97.7°F | Resp 18 | Ht 69.0 in | Wt 168.7 lb

## 2021-07-16 DIAGNOSIS — J439 Emphysema, unspecified: Secondary | ICD-10-CM | POA: Diagnosis not present

## 2021-07-16 DIAGNOSIS — Z85819 Personal history of malignant neoplasm of unspecified site of lip, oral cavity, and pharynx: Secondary | ICD-10-CM | POA: Insufficient documentation

## 2021-07-16 DIAGNOSIS — Z20822 Contact with and (suspected) exposure to covid-19: Secondary | ICD-10-CM | POA: Insufficient documentation

## 2021-07-16 DIAGNOSIS — Z9221 Personal history of antineoplastic chemotherapy: Secondary | ICD-10-CM | POA: Diagnosis not present

## 2021-07-16 DIAGNOSIS — Z923 Personal history of irradiation: Secondary | ICD-10-CM | POA: Diagnosis not present

## 2021-07-16 DIAGNOSIS — R911 Solitary pulmonary nodule: Secondary | ICD-10-CM | POA: Diagnosis not present

## 2021-07-16 DIAGNOSIS — D6489 Other specified anemias: Secondary | ICD-10-CM | POA: Diagnosis not present

## 2021-07-16 DIAGNOSIS — Z01818 Encounter for other preprocedural examination: Secondary | ICD-10-CM

## 2021-07-16 DIAGNOSIS — Z7902 Long term (current) use of antithrombotics/antiplatelets: Secondary | ICD-10-CM | POA: Diagnosis not present

## 2021-07-16 DIAGNOSIS — N130 Hydronephrosis with ureteropelvic junction obstruction: Secondary | ICD-10-CM | POA: Diagnosis not present

## 2021-07-16 DIAGNOSIS — Z87891 Personal history of nicotine dependence: Secondary | ICD-10-CM | POA: Diagnosis not present

## 2021-07-16 DIAGNOSIS — I2510 Atherosclerotic heart disease of native coronary artery without angina pectoris: Secondary | ICD-10-CM | POA: Diagnosis not present

## 2021-07-16 LAB — BLOOD GAS, ARTERIAL
Acid-base deficit: 4.5 mmol/L — ABNORMAL HIGH (ref 0.0–2.0)
Bicarbonate: 19.2 mmol/L — ABNORMAL LOW (ref 20.0–28.0)
Drawn by: 58793
O2 Saturation: 97.2 %
Patient temperature: 37
pCO2 arterial: 31 mmHg — ABNORMAL LOW (ref 32–48)
pH, Arterial: 7.4 (ref 7.35–7.45)
pO2, Arterial: 119 mmHg — ABNORMAL HIGH (ref 83–108)

## 2021-07-16 LAB — CBC
HCT: 35.1 % — ABNORMAL LOW (ref 39.0–52.0)
Hemoglobin: 10.1 g/dL — ABNORMAL LOW (ref 13.0–17.0)
MCH: 20.2 pg — ABNORMAL LOW (ref 26.0–34.0)
MCHC: 28.8 g/dL — ABNORMAL LOW (ref 30.0–36.0)
MCV: 70.3 fL — ABNORMAL LOW (ref 80.0–100.0)
Platelets: 268 10*3/uL (ref 150–400)
RBC: 4.99 MIL/uL (ref 4.22–5.81)
RDW: 20.2 % — ABNORMAL HIGH (ref 11.5–15.5)
WBC: 11.7 10*3/uL — ABNORMAL HIGH (ref 4.0–10.5)
nRBC: 0 % (ref 0.0–0.2)

## 2021-07-16 LAB — URINALYSIS, ROUTINE W REFLEX MICROSCOPIC
Bacteria, UA: NONE SEEN
Bilirubin Urine: NEGATIVE
Glucose, UA: NEGATIVE mg/dL
Hgb urine dipstick: NEGATIVE
Ketones, ur: NEGATIVE mg/dL
Leukocytes,Ua: NEGATIVE
Nitrite: NEGATIVE
Protein, ur: 30 mg/dL — AB
Specific Gravity, Urine: 1.018 (ref 1.005–1.030)
pH: 5 (ref 5.0–8.0)

## 2021-07-16 LAB — COMPREHENSIVE METABOLIC PANEL
ALT: 20 U/L (ref 0–44)
AST: 14 U/L — ABNORMAL LOW (ref 15–41)
Albumin: 3.3 g/dL — ABNORMAL LOW (ref 3.5–5.0)
Alkaline Phosphatase: 82 U/L (ref 38–126)
Anion gap: 8 (ref 5–15)
BUN: 14 mg/dL (ref 8–23)
CO2: 18 mmol/L — ABNORMAL LOW (ref 22–32)
Calcium: 8.8 mg/dL — ABNORMAL LOW (ref 8.9–10.3)
Chloride: 115 mmol/L — ABNORMAL HIGH (ref 98–111)
Creatinine, Ser: 1.58 mg/dL — ABNORMAL HIGH (ref 0.61–1.24)
GFR, Estimated: 48 mL/min — ABNORMAL LOW (ref >60)
Glucose, Bld: 91 mg/dL (ref 70–99)
Potassium: 3.9 mmol/L (ref 3.5–5.1)
Sodium: 141 mmol/L (ref 135–145)
Total Bilirubin: 0.5 mg/dL (ref 0.3–1.2)
Total Protein: 6.5 g/dL (ref 6.5–8.1)

## 2021-07-16 LAB — SARS CORONAVIRUS 2 (TAT 6-24 HRS): SARS Coronavirus 2: NEGATIVE

## 2021-07-16 LAB — PROTIME-INR
INR: 1 (ref 0.8–1.2)
Prothrombin Time: 13.5 seconds (ref 11.4–15.2)

## 2021-07-16 LAB — APTT: aPTT: 27 seconds (ref 24–36)

## 2021-07-16 LAB — TYPE AND SCREEN
ABO/RH(D): A NEG
Antibody Screen: NEGATIVE

## 2021-07-16 LAB — SURGICAL PCR SCREEN
MRSA, PCR: POSITIVE — AB
Staphylococcus aureus: POSITIVE — AB

## 2021-07-16 NOTE — Progress Notes (Signed)
PCP - Was seeing Dionisio David NP at [facility]. Cannot recall new person's name Cardiologist - Shelva Majestic MD  PPM/ICD - Denies  Chest x-ray - 07/16/21 EKG - 07/16/21 Stress Test - 07/09/21 ECHO - 03/04/21 Cardiac Cath -01/18/17   Sleep Study - Denies  DM - Denies  Blood Thinner Instructions: Per patient instructed to stop Plavix on the 16th Aspirin Instructions: Per patient instructed to continue to take the aspirin through to the day before surgery  COVID TEST- 07/16/21  Anesthesia review: No   Patient denies shortness of breath, fever, cough and chest pain at PAT appointment   All instructions explained to the patient, with a verbal understanding of the material. Patient agrees to go over the instructions while at home for a better understanding. Patient also instructed to wear a mask while in public after being tested for COVID-19. The opportunity to ask questions was provided.

## 2021-07-17 ENCOUNTER — Encounter (HOSPITAL_COMMUNITY): Payer: Self-pay | Admitting: Emergency Medicine

## 2021-07-17 ENCOUNTER — Encounter (HOSPITAL_COMMUNITY): Payer: Self-pay | Admitting: Physician Assistant

## 2021-07-17 ENCOUNTER — Encounter (HOSPITAL_COMMUNITY): Payer: Self-pay | Admitting: Certified Registered Nurse Anesthetist

## 2021-07-17 ENCOUNTER — Ambulatory Visit (INDEPENDENT_AMBULATORY_CARE_PROVIDER_SITE_OTHER)
Admission: RE | Admit: 2021-07-17 | Discharge: 2021-07-17 | Disposition: A | Discharge status: Home / Self Care | Payer: Medicare Other | Source: Ambulatory Visit | Attending: Emergency Medicine | Admitting: Emergency Medicine

## 2021-07-17 DIAGNOSIS — R911 Solitary pulmonary nodule: Secondary | ICD-10-CM | POA: Diagnosis not present

## 2021-07-17 NOTE — Progress Notes (Signed)
Notified Levonne Spiller of patient's positive surgical PCR.

## 2021-07-17 NOTE — Anesthesia Preprocedure Evaluation (Deleted)
Anesthesia Evaluation    Airway        Dental  (+) Upper Dentures, Lower Dentures   Pulmonary COPD, Current Smoker,  pulm nodules          Cardiovascular hypertension, Pt. on medications and Pt. on home beta blockers + CAD and + Peripheral Vascular Disease       Neuro/Psych Depression negative neurological ROS     GI/Hepatic negative GI ROS, Neg liver ROS,   Endo/Other  Hypothyroidism   Renal/GU Renal disease  negative genitourinary   Musculoskeletal  (+) Arthritis , Osteoarthritis,    Abdominal   Peds  Hematology negative hematology ROS (+)   Anesthesia Other Findings   Reproductive/Obstetrics                            Anesthesia Physical Anesthesia Plan  ASA: 3  Anesthesia Plan: General   Post-op Pain Management: Tylenol PO (pre-op)*   Induction: Intravenous  PONV Risk Score and Plan: 1 and Ondansetron, Dexamethasone, Midazolam and Treatment may vary due to age or medical condition  Airway Management Planned: Mask and Double Lumen EBT  Additional Equipment: Arterial line and None  Intra-op Plan:   Post-operative Plan: Extubation in OR  Informed Consent:   Plan Discussed with:   Anesthesia Plan Comments: (PAT note by Karoline Caldwell, PA-C: Recent admission January 2023 for right upper extremity ischemia. He initially presented on 03/02/2021 with right upper extremity ischemia and underwent a right subclavian, axillary, brachial, ulnar andradial artery thrombectomy. He underwent further work-up and was found to have high-grade right subclavian stenosis that was stented with a 8 x 29 VBX on 03/05/2021 by Dr. Trula Slade. He was discharged on aspirin Eliquis (subsequently stopped by Dr. Carlis Abbott at office visit 04/07/2021, Plavix added at that time). He was also incidentally discovered to have left lower lobe lung mass suspicious for bronchogenic carcinoma and follow-up with pulmonology  postdischarge.  PET/CT showed avidity of the nodule and he was referred to thoracic surgery for consideration of resection.    Long history of smoking, per Dr. Abran Duke note 06/18/2021 patient stated he was going to stop that day.  Patient had cath in 2018 showing multivessel CAD, medical therapy recommended and he continues to follow with Dr. Shelva Majestic -last seen 11/06/2020 at which time no changes were made in 72-month follow-up was recommended.  Given CAD history, Dr. Kipp Brood ordered preoperative nuclear stress which was done on 07/09/2021 and was low risk, negative for ischemia.  Echo 03/04/2021 showed EF 60 to 65%, moderate aortic valve sclerosis/calcification without evidence of aortic stenosis.  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  Preop labs reviewed, creatinine elevated 1.58 consistent with history of CKD 3, mild anemia with hemoglobin 11.7.  Patient reported last dose Plavix 07/14/2021.  EKG 07/16/2021: Sinus bradycardia.  Rate 49.  PULMONARY FUNCTION TEST 05/15/2021 Rpt FVC-%Pred-Pre % 86 (P) FEV1-%Pred-Pre % 76 (P) FEV1FVC-%Pred-Pre % 87 (P) TLC % pred % 109 (P) RV % pred % 147 (P) DLCO unc % pred % 60 (P)  Nuclear stress 07/09/2021: . Carlton Adam stress is electrically negative for ischemia . Myoview scan with thinning and decreaed tracer activity in the inferoseptal wall (base, mid) inferior wall (mid) consistent with probable soft tissue attenuation (diaphragm) No significant ischemia or scar . Marland Kitchen LVEF is 63% with normal wall motion . OVerall low risk scan  TTE 03/04/2021: 1. Left ventricular ejection fraction, by estimation, is  60 to 65%. The  left ventricle has normal function. The left ventricle has no regional  wall motion abnormalities. There is mild concentric left ventricular  hypertrophy. Left ventricular diastolic  parameters were normal.  2. Right ventricular systolic function is normal. The  right ventricular  size is normal.  3. Left atrial size was mildly dilated.  4. The mitral valve is normal in structure. Trivial mitral valve  regurgitation.  5. The aortic valve is tricuspid. There is moderate calcification of the  aortic valve. There is moderate thickening of the aortic valve. Aortic  valve regurgitation is not visualized. Aortic valve  sclerosis/calcification is present, without any evidence  of aortic stenosis.  6. The inferior vena cava is normal in size with greater than 50%  respiratory variability, suggesting right atrial pressure of 3 mmHg.   Comparison(s): Compared to prior echo in 05/2020, there is no significant  change. .  Cath 01/18/2017: . Post Atrio lesion is 85% stenosed. Colon Flattery 1st Mrg lesion is 40% stenosed. . 1st Mrg lesion is 40% stenosed. Colon Flattery 2nd Mrg to 2nd Mrg lesion is 30% stenosed. . Prox LAD to Mid LAD lesion is 95% stenosed. . Mid LAD to Dist LAD lesion is 90% stenosed. . The left ventricular systolic function is normal. . LV end diastolic pressure is normal. . The left ventricular ejection fraction is 55-65% by visual estimate.  Preserved global LV contractility with ejection fraction of 55-60% with very subtle mid anterolateral and mid inferior hypocontractility.  Multivessel CAD with long subtotal LAD stenosis after the first diagonal and septal perforating artery with significant collateralization arising from the diagonal vessel to the apical LAD with retrograde filling of the LAD distally to proximally and evidence for an additional diffuse 90% distal LAD stenosis; 40% ostial proximal OM1 stenosis, 30% OM 2 stenosis of the left circumflex coronary artery; and large dominant RCA with a focal 85% continuation branch stenosis after the PDA vessel.   RECOMMENDATION: Mr. Gerry Blanchfield has not had any chest pain. His ECG reveals anterolateral T wave abnormalities consistent with his subtotal LAD stenosis, which is collateralized  extensively in a retrograde fashion from a large first diagonal vessel. On his most recent nuclear study there was no ischemia in the inferior wall; however, he has a focal 85% continuation branch stenosis of his RCA after the PDA vessel and there are 2 small inferior LV branches and a posterior lateral vessel which arises distally beyond this stenosis. Initially, the patient will be treated medically and I have contacted Dr. Karsten Ro. So as to avoid preoperative dual antiplatelet therapy plans can be made for his urologic surgery to remove his renal stone with plans for delayed PCI to his RCA postoperatively, when long-term antiplatelet therapy can be administered. )       Anesthesia Quick Evaluation

## 2021-07-17 NOTE — Progress Notes (Signed)
Anesthesia Chart Review:  Recent admission January 2023 for right upper extremity ischemia.  He initially presented on 03/02/2021 with right upper extremity ischemia and underwent a right subclavian, axillary, brachial, ulnar and radial artery thrombectomy.  He underwent further work-up and was found to have high-grade right subclavian stenosis that was stented with a 8 x 29 VBX on 03/05/2021 by Dr. Trula Slade.  He was discharged on aspirin Eliquis (subsequently stopped by Dr. Carlis Abbott at office visit 04/07/2021, Plavix added at that time).  He was also incidentally discovered to have left lower lobe lung mass suspicious for bronchogenic carcinoma and follow-up with pulmonology postdischarge.  PET/CT showed avidity of the nodule and he was referred to thoracic surgery for consideration of resection.    Long history of smoking, per Dr. Abran Duke note 06/18/2021 patient stated he was going to stop that day.  Patient had cath in 2018 showing multivessel CAD, medical therapy recommended and he continues to follow with Dr. Shelva Majestic -last seen 11/06/2020 at which time no changes were made in 20-month follow-up was recommended.  Given CAD history, Dr. Kipp Brood ordered preoperative nuclear stress which was done on 07/09/2021 and was low risk, negative for ischemia.  Echo 03/04/2021 showed EF 60 to 65%, moderate aortic valve sclerosis/calcification without evidence of aortic stenosis.  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  Preop labs reviewed, creatinine elevated 1.58 consistent with history of CKD 3, mild anemia with hemoglobin 11.7.  Patient reported last dose Plavix 07/14/2021.  EKG 07/16/2021: Sinus bradycardia.  Rate 49.  PULMONARY FUNCTION TEST 05/15/2021 Rpt  FVC-%Pred-Pre % 86 (P)  FEV1-%Pred-Pre % 76 (P)  FEV1FVC-%Pred-Pre % 87 (P)  TLC % pred % 109 (P)  RV % pred % 147 (P)  DLCO unc % pred % 60 (P)   Nuclear stress 07/09/2021:   Lexiscan  stress is electrically negative for ischemia   Myoview scan with thinning and decreaed tracer activity in the inferoseptal wall (base, mid) inferior wall (mid) consistent with probable soft tissue attenuation (diaphragm)   No significant ischemia or scar .   LVEF is 63%  with normal wall  motion   OVerall low risk scan  TTE 03/04/2021:  1. Left ventricular ejection fraction, by estimation, is 60 to 65%. The  left ventricle has normal function. The left ventricle has no regional  wall motion abnormalities. There is mild concentric left ventricular  hypertrophy. Left ventricular diastolic  parameters were normal.   2. Right ventricular systolic function is normal. The right ventricular  size is normal.   3. Left atrial size was mildly dilated.   4. The mitral valve is normal in structure. Trivial mitral valve  regurgitation.   5. The aortic valve is tricuspid. There is moderate calcification of the  aortic valve. There is moderate thickening of the aortic valve. Aortic  valve regurgitation is not visualized. Aortic valve  sclerosis/calcification is present, without any evidence  of aortic stenosis.   6. The inferior vena cava is normal in size with greater than 50%  respiratory variability, suggesting right atrial pressure of 3 mmHg.   Comparison(s): Compared to prior echo in 05/2020, there is no significant  change. .  Cath 01/18/2017: Post Atrio lesion is 85% stenosed. Ost 1st Mrg lesion is 40% stenosed. 1st Mrg lesion is 40% stenosed. Ost 2nd Mrg to 2nd Mrg lesion is 30% stenosed. Prox LAD to Mid LAD lesion is 95% stenosed. Mid LAD to Dist LAD lesion is 90% stenosed.  The left ventricular systolic function is normal. LV end diastolic pressure is normal. The left ventricular ejection fraction is 55-65% by visual estimate.   Preserved global LV contractility with ejection fraction of 55-60% with very subtle mid anterolateral and mid inferior hypocontractility.   Multivessel CAD  with long subtotal LAD stenosis after the first diagonal and septal perforating artery with significant collateralization arising from the diagonal vessel to the apical LAD with retrograde filling of the LAD distally to proximally and evidence for an additional diffuse 90% distal LAD stenosis; 40% ostial proximal OM1 stenosis, 30% OM 2 stenosis of the left circumflex coronary artery; and large dominant RCA with a focal 85% continuation branch stenosis after the PDA vessel.    RECOMMENDATION: Mr. Mcgregor Tinnon has not had any chest pain.  His ECG reveals anterolateral T wave abnormalities consistent with his subtotal LAD stenosis, which is collateralized extensively in a retrograde fashion from a large first diagonal vessel.  On his most recent nuclear study there was no ischemia in the inferior wall; however, he has a focal 85% continuation branch stenosis of his RCA after the PDA vessel and there are 2 small inferior LV branches and a posterior lateral vessel which arises distally beyond this stenosis.  Initially, the patient will be treated medically and I have contacted Dr. Karsten Ro.  So as to avoid preoperative dual antiplatelet therapy plans can be made for his urologic surgery to remove his renal stone with plans for delayed PCI to his RCA postoperatively, when long-term antiplatelet therapy can be administered.     Wynonia Musty [facility] Short Stay Center/Anesthesiology Phone [phone] 07/17/2021 10:02 AM

## 2021-07-19 NOTE — Anesthesia Preprocedure Evaluation (Addendum)
Anesthesia Evaluation  Patient identified by MRN, date of birth, ID band Patient awake    Reviewed: Allergy & Precautions, NPO status   Airway Mallampati: II  TM Distance: >3 FB Neck ROM: Full    Dental no notable dental hx. (+) Lower Dentures, Upper Dentures   Pulmonary COPD,  COPD inhaler, Current Smoker and Patient abstained from smoking.,    Pulmonary exam normal breath sounds clear to auscultation       Cardiovascular hypertension, Pt. on medications and Pt. on home beta blockers + CAD (on plavix) and + Peripheral Vascular Disease  Normal cardiovascular exam Rhythm:Regular Rate:Bradycardia  EKG 07/16/21: Sinus bradycardia Otherwise normal ECG When compared with ECG of 02-Mar-2021 18:02, PREVIOUS ECG IS PRESENT  07/09/21: Myoview:Lexiscan stress is electrically negative for ischemia .  Myoview scan with thinning and decreaed tracer activity in the inferoseptal wall (base, mid) inferior wall (mid) consistent with probable soft tissue attenuation (diaphragm)   No significant ischemia or scar . Marland Kitchen  LVEF is 63%  with normal wall  motion .  OVerall low risk scan     Neuro/Psych PSYCHIATRIC DISORDERS Depression    GI/Hepatic   Endo/Other  Hypothyroidism   Renal/GU Renal InsufficiencyRenal disease     Musculoskeletal  (+) Arthritis , Osteoarthritis,    Abdominal   Peds  Hematology  (+) Blood dyscrasia, anemia ,   Anesthesia Other Findings H/o throat cancer  Reproductive/Obstetrics                           Anesthesia Physical Anesthesia Plan  ASA: 4  Anesthesia Plan: General   Post-op Pain Management:    Induction: Intravenous  PONV Risk Score and Plan: 1 and Treatment may vary due to age or medical condition, Ondansetron and Midazolam  Airway Management Planned: Oral ETT and Video Laryngoscope Planned  Additional Equipment: None  Intra-op Plan:   Post-operative Plan:  Extubation in OR and Possible Post-op intubation/ventilation  Informed Consent: I have reviewed the patients History and Physical, chart, labs and discussed the procedure including the risks, benefits and alternatives for the proposed anesthesia with the patient or authorized representative who has indicated his/her understanding and acceptance.     Dental advisory given  Plan Discussed with: CRNA, Anesthesiologist and Surgeon  Anesthesia Plan Comments: (Patient scheduled for thoracic surgery this AM. Will touch base with pulmonolgist to see what the plan is for coordinating patient' care to the main OR.   Plan to go straight from bronch to main OR. Will exchange SLT to DLT via exchange catheter. Plan discussed with Dr.Stoltzfus (anesthesiologist for the OR case). Discussed possible postop ventilation with patient. Norton Blizzard, MD  )       Anesthesia Quick Evaluation

## 2021-07-20 ENCOUNTER — Ambulatory Visit (HOSPITAL_COMMUNITY): Payer: Medicare Other | Admitting: Anesthesiology

## 2021-07-20 ENCOUNTER — Encounter (HOSPITAL_COMMUNITY)
Admission: RE | Disposition: A | Discharge status: Home / Self Care | Payer: Self-pay | Source: Home / Self Care | Attending: Thoracic Surgery (Cardiothoracic Vascular Surgery)

## 2021-07-20 ENCOUNTER — Inpatient Hospital Stay (HOSPITAL_COMMUNITY)
Admission: RE | Admit: 2021-07-20 | Discharge: 2021-07-28 | DRG: 164 | Disposition: A | Discharge status: Home / Self Care | Payer: Medicare Other | Attending: Thoracic Surgery (Cardiothoracic Vascular Surgery) | Admitting: Thoracic Surgery (Cardiothoracic Vascular Surgery)

## 2021-07-20 ENCOUNTER — Ambulatory Visit (HOSPITAL_COMMUNITY): Payer: Medicare Other

## 2021-07-20 ENCOUNTER — Other Ambulatory Visit: Payer: Self-pay

## 2021-07-20 ENCOUNTER — Encounter (HOSPITAL_COMMUNITY): Payer: Self-pay | Admitting: Emergency Medicine

## 2021-07-20 ENCOUNTER — Inpatient Hospital Stay (HOSPITAL_COMMUNITY): Payer: Medicare Other

## 2021-07-20 DIAGNOSIS — R339 Retention of urine, unspecified: Secondary | ICD-10-CM | POA: Diagnosis not present

## 2021-07-20 DIAGNOSIS — C3432 Malignant neoplasm of lower lobe, left bronchus or lung: Principal | ICD-10-CM | POA: Diagnosis present

## 2021-07-20 DIAGNOSIS — Z9221 Personal history of antineoplastic chemotherapy: Secondary | ICD-10-CM

## 2021-07-20 DIAGNOSIS — Z885 Allergy status to narcotic agent status: Secondary | ICD-10-CM

## 2021-07-20 DIAGNOSIS — D62 Acute posthemorrhagic anemia: Secondary | ICD-10-CM | POA: Diagnosis not present

## 2021-07-20 DIAGNOSIS — F32A Depression, unspecified: Secondary | ICD-10-CM | POA: Diagnosis present

## 2021-07-20 DIAGNOSIS — Z79899 Other long term (current) drug therapy: Secondary | ICD-10-CM | POA: Diagnosis not present

## 2021-07-20 DIAGNOSIS — I10 Essential (primary) hypertension: Secondary | ICD-10-CM

## 2021-07-20 DIAGNOSIS — I739 Peripheral vascular disease, unspecified: Secondary | ICD-10-CM | POA: Diagnosis not present

## 2021-07-20 DIAGNOSIS — K567 Ileus, unspecified: Secondary | ICD-10-CM | POA: Diagnosis not present

## 2021-07-20 DIAGNOSIS — E039 Hypothyroidism, unspecified: Secondary | ICD-10-CM | POA: Diagnosis present

## 2021-07-20 DIAGNOSIS — Z7982 Long term (current) use of aspirin: Secondary | ICD-10-CM

## 2021-07-20 DIAGNOSIS — F17210 Nicotine dependence, cigarettes, uncomplicated: Secondary | ICD-10-CM | POA: Diagnosis present

## 2021-07-20 DIAGNOSIS — I2510 Atherosclerotic heart disease of native coronary artery without angina pectoris: Secondary | ICD-10-CM | POA: Diagnosis present

## 2021-07-20 DIAGNOSIS — Z85818 Personal history of malignant neoplasm of other sites of lip, oral cavity, and pharynx: Secondary | ICD-10-CM

## 2021-07-20 DIAGNOSIS — Z923 Personal history of irradiation: Secondary | ICD-10-CM

## 2021-07-20 DIAGNOSIS — Z79890 Hormone replacement therapy (postmenopausal): Secondary | ICD-10-CM

## 2021-07-20 DIAGNOSIS — J939 Pneumothorax, unspecified: Secondary | ICD-10-CM | POA: Diagnosis not present

## 2021-07-20 DIAGNOSIS — C7A8 Other malignant neuroendocrine tumors: Secondary | ICD-10-CM | POA: Diagnosis present

## 2021-07-20 DIAGNOSIS — Z902 Acquired absence of lung [part of]: Principal | ICD-10-CM

## 2021-07-20 DIAGNOSIS — Z7902 Long term (current) use of antithrombotics/antiplatelets: Secondary | ICD-10-CM | POA: Diagnosis not present

## 2021-07-20 DIAGNOSIS — Z7951 Long term (current) use of inhaled steroids: Secondary | ICD-10-CM | POA: Diagnosis not present

## 2021-07-20 DIAGNOSIS — N179 Acute kidney failure, unspecified: Secondary | ICD-10-CM | POA: Diagnosis not present

## 2021-07-20 DIAGNOSIS — R911 Solitary pulmonary nodule: Secondary | ICD-10-CM

## 2021-07-20 HISTORY — PX: FIDUCIAL MARKER PLACEMENT: SHX6858

## 2021-07-20 HISTORY — PX: LYMPH NODE BIOPSY: SHX201

## 2021-07-20 HISTORY — PX: BRONCHIAL BRUSHINGS: SHX5108

## 2021-07-20 HISTORY — PX: INTERCOSTAL NERVE BLOCK: SHX5021

## 2021-07-20 HISTORY — PX: VIDEO BRONCHOSCOPY WITH RADIAL ENDOBRONCHIAL ULTRASOUND: SHX6849

## 2021-07-20 HISTORY — PX: BRONCHIAL NEEDLE ASPIRATION BIOPSY: SHX5106

## 2021-07-20 LAB — ABO/RH: ABO/RH(D): A NEG

## 2021-07-20 SURGERY — BRONCHOSCOPY, WITH BIOPSY USING ELECTROMAGNETIC NAVIGATION
Anesthesia: General

## 2021-07-20 SURGERY — LOBECTOMY, LUNG, ROBOT-ASSISTED, USING VATS
Anesthesia: General | Site: Chest | Laterality: Left

## 2021-07-20 MED ORDER — DEXAMETHASONE SODIUM PHOSPHATE 10 MG/ML IJ SOLN
INTRAMUSCULAR | Status: AC
  Filled 2021-07-20: qty 1

## 2021-07-20 MED ORDER — ACETAMINOPHEN 500 MG PO TABS
1000.0000 mg | ORAL_TABLET | Freq: Once | ORAL | Status: AC
  Administered 2021-07-20: 1000 mg via ORAL
  Filled 2021-07-20: qty 2

## 2021-07-20 MED ORDER — LUNG SURGERY BOOK
Freq: Once | Status: AC
  Filled 2021-07-20: qty 1

## 2021-07-20 MED ORDER — EPHEDRINE SULFATE (PRESSORS) 50 MG/ML IJ SOLN
INTRAMUSCULAR | Status: DC | PRN
  Administered 2021-07-20 (×3): 5 mg via INTRAVENOUS
  Administered 2021-07-20: 10 mg via INTRAVENOUS
  Administered 2021-07-20: 5 mg via INTRAVENOUS
  Administered 2021-07-20: 10 mg via INTRAVENOUS

## 2021-07-20 MED ORDER — CEFAZOLIN SODIUM-DEXTROSE 2-4 GM/100ML-% IV SOLN
2.0000 g | INTRAVENOUS | Status: AC
  Administered 2021-07-20: 2 g via INTRAVENOUS
  Filled 2021-07-20: qty 100

## 2021-07-20 MED ORDER — CHLORHEXIDINE GLUCONATE 0.12 % MT SOLN
OROMUCOSAL | Status: AC
  Administered 2021-07-20: 15 mL
  Filled 2021-07-20: qty 15

## 2021-07-20 MED ORDER — MIDAZOLAM HCL 5 MG/5ML IJ SOLN
INTRAMUSCULAR | Status: DC | PRN
Start: 2021-07-20 — End: 2021-07-20
  Administered 2021-07-20: 2 mg via INTRAVENOUS

## 2021-07-20 MED ORDER — ALBUTEROL SULFATE (2.5 MG/3ML) 0.083% IN NEBU
2.5000 mg | INHALATION_SOLUTION | RESPIRATORY_TRACT | Status: DC
  Administered 2021-07-20 – 2021-07-22 (×8): 2.5 mg via RESPIRATORY_TRACT
  Filled 2021-07-20 (×9): qty 3

## 2021-07-20 MED ORDER — LACTATED RINGERS IV SOLN: INTRAVENOUS | Status: DC | PRN

## 2021-07-20 MED ORDER — LEVOTHYROXINE SODIUM 112 MCG PO TABS
112.0000 ug | ORAL_TABLET | Freq: Every day | ORAL | Status: DC
  Administered 2021-07-21 – 2021-07-28 (×8): 112 ug via ORAL
  Filled 2021-07-20 (×8): qty 1

## 2021-07-20 MED ORDER — FENTANYL CITRATE PF 50 MCG/ML IJ SOSY
25.0000 ug | PREFILLED_SYRINGE | INTRAMUSCULAR | Status: DC | PRN
  Administered 2021-07-20 – 2021-07-21 (×7): 50 ug via INTRAVENOUS
  Filled 2021-07-20 (×7): qty 1

## 2021-07-20 MED ORDER — ONDANSETRON HCL 4 MG/2ML IJ SOLN
INTRAMUSCULAR | Status: DC | PRN
Start: 2021-07-20 — End: 2021-07-20
  Administered 2021-07-20: 4 mg via INTRAVENOUS

## 2021-07-20 MED ORDER — BISACODYL 5 MG PO TBEC
10.0000 mg | DELAYED_RELEASE_TABLET | Freq: Every day | ORAL | Status: DC
  Administered 2021-07-21 – 2021-07-28 (×8): 10 mg via ORAL
  Filled 2021-07-20 (×8): qty 2

## 2021-07-20 MED ORDER — PROPOFOL 10 MG/ML IV BOLUS
INTRAVENOUS | Status: AC
  Filled 2021-07-20: qty 20

## 2021-07-20 MED ORDER — METOPROLOL TARTRATE 25 MG PO TABS
25.0000 mg | ORAL_TABLET | Freq: Two times a day (BID) | ORAL | Status: DC
  Administered 2021-07-20 – 2021-07-28 (×15): 25 mg via ORAL
  Filled 2021-07-20 (×17): qty 1

## 2021-07-20 MED ORDER — SUGAMMADEX SODIUM 200 MG/2ML IV SOLN
INTRAVENOUS | Status: DC | PRN
  Administered 2021-07-20: 200 mg via INTRAVENOUS

## 2021-07-20 MED ORDER — 0.9 % SODIUM CHLORIDE (POUR BTL) OPTIME
TOPICAL | Status: DC | PRN
  Administered 2021-07-20: 2000 mL

## 2021-07-20 MED ORDER — ISOSORBIDE MONONITRATE ER 30 MG PO TB24
30.0000 mg | ORAL_TABLET | Freq: Every day | ORAL | Status: DC
  Administered 2021-07-21 – 2021-07-28 (×8): 30 mg via ORAL
  Filled 2021-07-20 (×9): qty 1

## 2021-07-20 MED ORDER — CEFAZOLIN SODIUM-DEXTROSE 2-4 GM/100ML-% IV SOLN
2.0000 g | Freq: Three times a day (TID) | INTRAVENOUS | Status: AC
  Administered 2021-07-20 (×2): 2 g via INTRAVENOUS
  Filled 2021-07-20 (×2): qty 100

## 2021-07-20 MED ORDER — MIDAZOLAM HCL 2 MG/2ML IJ SOLN
INTRAMUSCULAR | Status: AC
  Filled 2021-07-20: qty 2

## 2021-07-20 MED ORDER — TRAMADOL HCL 50 MG PO TABS
50.0000 mg | ORAL_TABLET | Freq: Four times a day (QID) | ORAL | Status: DC | PRN
  Administered 2021-07-20 – 2021-07-28 (×4): 100 mg via ORAL
  Filled 2021-07-20 (×4): qty 2

## 2021-07-20 MED ORDER — MOMETASONE FURO-FORMOTEROL FUM 100-5 MCG/ACT IN AERO
2.0000 | INHALATION_SPRAY | Freq: Two times a day (BID) | RESPIRATORY_TRACT | Status: DC
  Administered 2021-07-20 – 2021-07-27 (×13): 2 via RESPIRATORY_TRACT
  Filled 2021-07-20: qty 8.8

## 2021-07-20 MED ORDER — SODIUM CHLORIDE FLUSH 0.9 % IV SOLN
INTRAVENOUS | Status: DC | PRN
  Administered 2021-07-20: 100 mL

## 2021-07-20 MED ORDER — CLOPIDOGREL BISULFATE 75 MG PO TABS
75.0000 mg | ORAL_TABLET | Freq: Every day | ORAL | Status: DC
  Administered 2021-07-21 – 2021-07-28 (×8): 75 mg via ORAL
  Filled 2021-07-20 (×8): qty 1

## 2021-07-20 MED ORDER — BUPIVACAINE HCL (PF) 0.5 % IJ SOLN
INTRAMUSCULAR | Status: AC
Start: 2021-07-20 — End: ?
  Filled 2021-07-20: qty 30

## 2021-07-20 MED ORDER — LACTATED RINGERS IV SOLN: INTRAVENOUS | Status: DC

## 2021-07-20 MED ORDER — HEMOSTATIC AGENTS (NO CHARGE) OPTIME
TOPICAL | Status: DC | PRN
  Administered 2021-07-20: 1 via TOPICAL

## 2021-07-20 MED ORDER — ATORVASTATIN CALCIUM 40 MG PO TABS
40.0000 mg | ORAL_TABLET | Freq: Every day | ORAL | Status: DC
  Administered 2021-07-21 – 2021-07-28 (×8): 40 mg via ORAL
  Filled 2021-07-20 (×8): qty 1

## 2021-07-20 MED ORDER — FENTANYL CITRATE (PF) 100 MCG/2ML IJ SOLN
INTRAMUSCULAR | Status: AC
  Filled 2021-07-20: qty 2

## 2021-07-20 MED ORDER — BUPIVACAINE LIPOSOME 1.3 % IJ SUSP
INTRAMUSCULAR | Status: AC
Start: 2021-07-20 — End: ?
  Filled 2021-07-20: qty 20

## 2021-07-20 MED ORDER — DEXAMETHASONE SODIUM PHOSPHATE 10 MG/ML IJ SOLN
INTRAMUSCULAR | Status: DC | PRN
  Administered 2021-07-20: 10 mg via INTRAVENOUS

## 2021-07-20 MED ORDER — LIDOCAINE 2% (20 MG/ML) 5 ML SYRINGE
INTRAMUSCULAR | Status: AC
  Filled 2021-07-20: qty 5

## 2021-07-20 MED ORDER — UMECLIDINIUM BROMIDE 62.5 MCG/ACT IN AEPB
1.0000 | INHALATION_SPRAY | Freq: Every day | RESPIRATORY_TRACT | Status: DC
Start: 2021-07-21 — End: 2021-07-28
  Administered 2021-07-21 – 2021-07-27 (×6): 1 via RESPIRATORY_TRACT
  Filled 2021-07-20: qty 7

## 2021-07-20 MED ORDER — OXYCODONE HCL 5 MG PO TABS
5.0000 mg | ORAL_TABLET | ORAL | Status: DC | PRN
  Administered 2021-07-20 – 2021-07-27 (×10): 10 mg via ORAL
  Filled 2021-07-20 (×10): qty 2

## 2021-07-20 MED ORDER — ROCURONIUM BROMIDE 10 MG/ML (PF) SYRINGE
PREFILLED_SYRINGE | INTRAVENOUS | Status: AC
  Filled 2021-07-20: qty 10

## 2021-07-20 MED ORDER — ROCURONIUM BROMIDE 10 MG/ML (PF) SYRINGE
PREFILLED_SYRINGE | INTRAVENOUS | Status: DC | PRN
  Administered 2021-07-20: 60 mg via INTRAVENOUS
  Administered 2021-07-20: 20 mg via INTRAVENOUS
  Administered 2021-07-20: 40 mg via INTRAVENOUS
  Administered 2021-07-20 (×2): 20 mg via INTRAVENOUS

## 2021-07-20 MED ORDER — HYDRALAZINE HCL 50 MG PO TABS
50.0000 mg | ORAL_TABLET | Freq: Three times a day (TID) | ORAL | Status: DC
  Administered 2021-07-20 – 2021-07-28 (×19): 50 mg via ORAL
  Filled 2021-07-20 (×22): qty 1

## 2021-07-20 MED ORDER — ORAL CARE MOUTH RINSE: 15.0000 mL | Freq: Once | OROMUCOSAL | Status: AC

## 2021-07-20 MED ORDER — ACETAMINOPHEN 500 MG PO TABS
1000.0000 mg | ORAL_TABLET | Freq: Four times a day (QID) | ORAL | Status: AC
  Administered 2021-07-20 – 2021-07-25 (×15): 1000 mg via ORAL
  Filled 2021-07-20 (×15): qty 2

## 2021-07-20 MED ORDER — PROPOFOL 10 MG/ML IV BOLUS
INTRAVENOUS | Status: DC | PRN
  Administered 2021-07-20: 130 mg via INTRAVENOUS
  Administered 2021-07-20: 20 mg via INTRAVENOUS
  Administered 2021-07-20: 30 mg via INTRAVENOUS

## 2021-07-20 MED ORDER — FENTANYL CITRATE (PF) 250 MCG/5ML IJ SOLN
INTRAMUSCULAR | Status: AC
  Filled 2021-07-20: qty 5

## 2021-07-20 MED ORDER — ONDANSETRON HCL 4 MG/2ML IJ SOLN: 4.0000 mg | Freq: Four times a day (QID) | INTRAMUSCULAR | Status: DC | PRN

## 2021-07-20 MED ORDER — PHENYLEPHRINE HCL-NACL 20-0.9 MG/250ML-% IV SOLN
INTRAVENOUS | Status: DC | PRN
  Administered 2021-07-20: 20 ug/min via INTRAVENOUS

## 2021-07-20 MED ORDER — METHYLENE BLUE 1 % INJ SOLN
INTRAVENOUS | Status: DC | PRN
  Administered 2021-07-20: .5 mL

## 2021-07-20 MED ORDER — SENNOSIDES-DOCUSATE SODIUM 8.6-50 MG PO TABS
1.0000 | ORAL_TABLET | Freq: Every day | ORAL | Status: DC
  Administered 2021-07-20 – 2021-07-26 (×7): 1 via ORAL
  Filled 2021-07-20 (×7): qty 1

## 2021-07-20 MED ORDER — LIDOCAINE 2% (20 MG/ML) 5 ML SYRINGE
INTRAMUSCULAR | Status: DC | PRN
Start: 2021-07-20 — End: 2021-07-20
  Administered 2021-07-20: 80 mg via INTRAVENOUS

## 2021-07-20 MED ORDER — ASPIRIN 81 MG PO TBEC
81.0000 mg | DELAYED_RELEASE_TABLET | Freq: Every day | ORAL | Status: DC
  Administered 2021-07-21 – 2021-07-28 (×8): 81 mg via ORAL
  Filled 2021-07-20 (×8): qty 1

## 2021-07-20 MED ORDER — BUDESON-GLYCOPYRROL-FORMOTEROL 160-9-4.8 MCG/ACT IN AERO: 2.0000 | INHALATION_SPRAY | Freq: Every day | RESPIRATORY_TRACT | Status: DC

## 2021-07-20 MED ORDER — TAMSULOSIN HCL 0.4 MG PO CAPS
0.4000 mg | ORAL_CAPSULE | Freq: Every day | ORAL | Status: DC
  Administered 2021-07-21 – 2021-07-28 (×8): 0.4 mg via ORAL
  Filled 2021-07-20 (×8): qty 1

## 2021-07-20 MED ORDER — ACETAMINOPHEN 160 MG/5ML PO SOLN: 1000.0000 mg | Freq: Four times a day (QID) | ORAL | Status: AC

## 2021-07-20 MED ORDER — FENTANYL CITRATE (PF) 250 MCG/5ML IJ SOLN
INTRAMUSCULAR | Status: DC | PRN
  Administered 2021-07-20 (×3): 50 ug via INTRAVENOUS

## 2021-07-20 MED ORDER — FENTANYL CITRATE (PF) 100 MCG/2ML IJ SOLN
25.0000 ug | INTRAMUSCULAR | Status: DC | PRN
  Administered 2021-07-20: 25 ug via INTRAVENOUS
  Administered 2021-07-20 (×2): 50 ug via INTRAVENOUS

## 2021-07-20 MED ORDER — PHENYLEPHRINE 80 MCG/ML (10ML) SYRINGE FOR IV PUSH (FOR BLOOD PRESSURE SUPPORT)
PREFILLED_SYRINGE | INTRAVENOUS | Status: DC | PRN
  Administered 2021-07-20: 80 ug via INTRAVENOUS

## 2021-07-20 MED ORDER — AMLODIPINE BESYLATE 10 MG PO TABS
10.0000 mg | ORAL_TABLET | Freq: Every day | ORAL | Status: DC
  Administered 2021-07-21 – 2021-07-28 (×6): 10 mg via ORAL
  Filled 2021-07-20 (×8): qty 1

## 2021-07-20 MED ORDER — CHLORHEXIDINE GLUCONATE 0.12 % MT SOLN: 15.0000 mL | Freq: Once | OROMUCOSAL | Status: AC

## 2021-07-20 MED ORDER — ENOXAPARIN SODIUM 40 MG/0.4ML IJ SOSY
40.0000 mg | PREFILLED_SYRINGE | Freq: Every day | INTRAMUSCULAR | Status: DC
  Administered 2021-07-21 – 2021-07-28 (×8): 40 mg via SUBCUTANEOUS
  Filled 2021-07-20 (×8): qty 0.4

## 2021-07-20 MED ORDER — ONDANSETRON HCL 4 MG/2ML IJ SOLN
INTRAMUSCULAR | Status: AC
  Filled 2021-07-20: qty 2

## 2021-07-20 SURGICAL SUPPLY — 116 items
ADH SKN CLS APL DERMABOND .7 (GAUZE/BANDAGES/DRESSINGS) ×1
APL PRP STRL LF DISP 70% ISPRP (MISCELLANEOUS) ×1
BAG SPEC RTRVL C1550 15 (MISCELLANEOUS) ×1
BLADE CLIPPER SURG (BLADE) ×3 IMPLANT
BLADE SURG 11 STRL SS (BLADE) ×3 IMPLANT
BNDG COHESIVE 6X5 TAN STRL LF (GAUZE/BANDAGES/DRESSINGS) ×3 IMPLANT
CANISTER SUCT 3000ML PPV (MISCELLANEOUS) ×6 IMPLANT
CANNULA REDUC XI 12-8 STAPL (CANNULA) ×4
CANNULA REDUCER 12-8 DVNC XI (CANNULA) ×4 IMPLANT
CATH THORACIC 28FR (CATHETERS) ×1 IMPLANT
CATH TROCAR 20FR (CATHETERS) IMPLANT
CHLORAPREP W/TINT 26 (MISCELLANEOUS) ×3 IMPLANT
CLIP VESOCCLUDE MED 6/CT (CLIP) IMPLANT
CNTNR URN SCR LID CUP LEK RST (MISCELLANEOUS) ×10 IMPLANT
CONN ST 1/4X3/8  BEN (MISCELLANEOUS)
CONN ST 1/4X3/8 BEN (MISCELLANEOUS) IMPLANT
CONT SPEC 4OZ STRL OR WHT (MISCELLANEOUS) ×22
DEFOGGER SCOPE WARMER CLEARIFY (MISCELLANEOUS) ×3 IMPLANT
DERMABOND ADVANCED (GAUZE/BANDAGES/DRESSINGS) ×1
DERMABOND ADVANCED .7 DNX12 (GAUZE/BANDAGES/DRESSINGS) ×2 IMPLANT
DISSECTOR BLUNT TIP ENDO 5MM (MISCELLANEOUS) IMPLANT
DRAIN CHANNEL 28F RND 3/8 FF (WOUND CARE) IMPLANT
DRAPE ARM DVNC X/XI (DISPOSABLE) ×8 IMPLANT
DRAPE COLUMN DVNC XI (DISPOSABLE) ×2 IMPLANT
DRAPE CV SPLIT W-CLR ANES SCRN (DRAPES) ×3 IMPLANT
DRAPE DA VINCI XI ARM (DISPOSABLE) ×8
DRAPE DA VINCI XI COLUMN (DISPOSABLE) ×2
DRAPE ORTHO SPLIT 77X108 STRL (DRAPES) ×2
DRAPE SURG ORHT 6 SPLT 77X108 (DRAPES) ×2 IMPLANT
ELECT BLADE 6.5 EXT (BLADE) IMPLANT
ELECT REM PT RETURN 9FT ADLT (ELECTROSURGICAL) ×2
ELECTRODE REM PT RTRN 9FT ADLT (ELECTROSURGICAL) ×2 IMPLANT
GAUZE 4X4 16PLY ~~LOC~~+RFID DBL (SPONGE) ×2 IMPLANT
GAUZE KITTNER 4X8 (MISCELLANEOUS) ×5 IMPLANT
GAUZE SPONGE 4X4 12PLY STRL (GAUZE/BANDAGES/DRESSINGS) ×3 IMPLANT
GLOVE BIO SURGEON STRL SZ7.5 (GLOVE) ×6 IMPLANT
GLOVE SURG POLYISO LF SZ8 (GLOVE) ×3 IMPLANT
GOWN STRL REUS W/ TWL LRG LVL3 (GOWN DISPOSABLE) ×4 IMPLANT
GOWN STRL REUS W/ TWL XL LVL3 (GOWN DISPOSABLE) ×6 IMPLANT
GOWN STRL REUS W/TWL 2XL LVL3 (GOWN DISPOSABLE) ×3 IMPLANT
GOWN STRL REUS W/TWL LRG LVL3 (GOWN DISPOSABLE) ×4
GOWN STRL REUS W/TWL XL LVL3 (GOWN DISPOSABLE) ×6
HEMOSTAT SURGICEL 2X14 (HEMOSTASIS) ×7 IMPLANT
IRRIGATION STRYKERFLOW (MISCELLANEOUS) IMPLANT
IRRIGATOR STRYKERFLOW (MISCELLANEOUS)
IRRIGATOR SUCT 8 DISP DVNC XI (IRRIGATION / IRRIGATOR) IMPLANT
IRRIGATOR SUCTION 8MM XI DISP (IRRIGATION / IRRIGATOR) ×2
KIT BASIN OR (CUSTOM PROCEDURE TRAY) ×3 IMPLANT
KIT TURNOVER KIT B (KITS) ×3 IMPLANT
NEEDLE 22X1 1/2 (OR ONLY) (NEEDLE) ×3 IMPLANT
NS IRRIG 1000ML POUR BTL (IV SOLUTION) ×9 IMPLANT
PACK CHEST (CUSTOM PROCEDURE TRAY) ×3 IMPLANT
PAD ARMBOARD 7.5X6 YLW CONV (MISCELLANEOUS) ×15 IMPLANT
PORT ACCESS TROCAR AIRSEAL 12 (TROCAR) ×2 IMPLANT
PORT ACCESS TROCAR AIRSEAL 5M (TROCAR) ×1
POUCH ENDO CATCH II 15MM (MISCELLANEOUS) IMPLANT
PROGEL SPRAY TIP 11IN (MISCELLANEOUS) ×2
RELOAD STAPLE 45 2.5 WHT DVNC (STAPLE) IMPLANT
RELOAD STAPLE 45 3.5 BLU DVNC (STAPLE) IMPLANT
RELOAD STAPLE 45 4.3 GRN DVNC (STAPLE) IMPLANT
RELOAD STAPLER 2.5X45 WHT DVNC (STAPLE) ×2 IMPLANT
RELOAD STAPLER 3.5X45 BLU DVNC (STAPLE) ×7 IMPLANT
RELOAD STAPLER 4.3X45 GRN DVNC (STAPLE) ×1 IMPLANT
RETRACTOR WOUND ALXS 19CM XSML (INSTRUMENTS) IMPLANT
RTRCTR WOUND ALEXIS 19CM XSML (INSTRUMENTS)
SCISSORS LAP 5X35 DISP (ENDOMECHANICALS) IMPLANT
SEAL CANN UNIV 5-8 DVNC XI (MISCELLANEOUS) ×4 IMPLANT
SEAL XI 5MM-8MM UNIVERSAL (MISCELLANEOUS) ×4
SEALANT PROGEL (MISCELLANEOUS) IMPLANT
SEALER LIGASURE MARYLAND 30 (ELECTROSURGICAL) IMPLANT
SET TRI-LUMEN FLTR TB AIRSEAL (TUBING) ×3 IMPLANT
SOLUTION ELECTROLUBE (MISCELLANEOUS) IMPLANT
SPONGE INTESTINAL PEANUT (DISPOSABLE) IMPLANT
SPONGE T-LAP 18X18 ~~LOC~~+RFID (SPONGE) ×9 IMPLANT
SPONGE TONSIL TAPE 1 RFD (DISPOSABLE) IMPLANT
STAPLER 45 SUREFORM CVD (STAPLE) ×2
STAPLER 45 SUREFORM CVD DVNC (STAPLE) IMPLANT
STAPLER CANNULA SEAL DVNC XI (STAPLE) ×4 IMPLANT
STAPLER CANNULA SEAL XI (STAPLE) ×4
STAPLER RELOAD 2.5X45 WHITE (STAPLE) ×4
STAPLER RELOAD 2.5X45 WHT DVNC (STAPLE) ×2
STAPLER RELOAD 3.5X45 BLU DVNC (STAPLE) ×7
STAPLER RELOAD 3.5X45 BLUE (STAPLE) ×14
STAPLER RELOAD 4.3X45 GREEN (STAPLE) ×2
STAPLER RELOAD 4.3X45 GRN DVNC (STAPLE) ×1
STOPCOCK 4 WAY LG BORE MALE ST (IV SETS) ×3 IMPLANT
SUT MNCRL AB 3-0 PS2 18 (SUTURE) IMPLANT
SUT MON AB 2-0 CT1 36 (SUTURE) IMPLANT
SUT PDS AB 1 CTX 36 (SUTURE) IMPLANT
SUT PROLENE 4 0 RB 1 (SUTURE)
SUT PROLENE 4-0 RB1 .5 CRCL 36 (SUTURE) IMPLANT
SUT SILK  1 MH (SUTURE) ×4
SUT SILK 1 MH (SUTURE) ×2 IMPLANT
SUT SILK 1 TIES 10X30 (SUTURE) IMPLANT
SUT SILK 2 0 SH (SUTURE) ×1 IMPLANT
SUT SILK 2 0SH CR/8 30 (SUTURE) IMPLANT
SUT VIC AB 1 CTX 36 (SUTURE)
SUT VIC AB 1 CTX36XBRD ANBCTR (SUTURE) IMPLANT
SUT VIC AB 2-0 CT1 27 (SUTURE) ×2
SUT VIC AB 2-0 CT1 TAPERPNT 27 (SUTURE) ×2 IMPLANT
SUT VIC AB 3-0 SH 27 (SUTURE) ×4
SUT VIC AB 3-0 SH 27X BRD (SUTURE) ×4 IMPLANT
SUT VICRYL 0 TIES 12 18 (SUTURE) ×3 IMPLANT
SUT VICRYL 0 UR6 27IN ABS (SUTURE) ×6 IMPLANT
SUT VICRYL 2 TP 1 (SUTURE) IMPLANT
SYR 10ML LL (SYRINGE) ×3 IMPLANT
SYR 20ML LL LF (SYRINGE) ×3 IMPLANT
SYR 50ML LL SCALE MARK (SYRINGE) ×3 IMPLANT
SYSTEM RETRIEVAL ANCHOR 15 (MISCELLANEOUS) ×1 IMPLANT
SYSTEM SAHARA CHEST DRAIN ATS (WOUND CARE) ×3 IMPLANT
TAPE CLOTH 4X10 WHT NS (GAUZE/BANDAGES/DRESSINGS) ×3 IMPLANT
TIP APPLICATOR SPRAY EXTEND 16 (VASCULAR PRODUCTS) IMPLANT
TIP SPRAY PROGEL 11IN (MISCELLANEOUS) IMPLANT
TOWEL GREEN STERILE (TOWEL DISPOSABLE) ×3 IMPLANT
TRAY FOLEY MTR SLVR 16FR STAT (SET/KITS/TRAYS/PACK) ×3 IMPLANT
TUBING EXTENTION W/L.L. (IV SETS) ×3 IMPLANT

## 2021-07-20 NOTE — Hospital Course (Addendum)
History of Present Illness:    [name] [name] 66 y.o. male referred by Dr. Lamonte Sakai for surgical evaluation of a 1.9 cm left lower lobe pulmonary nodule.  He has a significant smoking history and continues to smoke about half a pack a day.  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  He has been followed via lung cancer screening program and was noted to have this lesion back in January 2023.  He subsequently underwent a PET/CT which showed avidity within the nodule.   He denies any neurologic symptoms.  He occasionally has some shortness of breath.  In 2018 he did undergo a left heart catheterization which showed disease in his LAD.  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  He is currently on Plavix and aspirin as well.  The patient and all relevant studies were reviewed by Dr. Kipp Brood who recommended proceeding with surgical intervention.  Hospital course:  Patient was admitted electively and on 07/20/2021 was initially taken to the endoscopy suite at which time he underwent flexible video fiberoptic bronchoscopy with robotic assistance and biopsies by Dr. Lamonte Sakai.  He was then transferred to the operating room and subsequently underwent robotic assisted left video thoracoscopy with left lower lobectomy, mediastinal lymph node sampling and intercostal nerve block.  He tolerated the procedure well was taken to the postanesthesia care unit in stable condition   Postoperative hospital course:  The patient has progressed somewhat slowly.  His vital signs have remained stable but oxygen has been slow to wean.  He has significant pulmonary disease and has required aggressive pulmonary hygiene as well as nebulizers and inhalers.  He is showing some steady progress in this regard.  Patient has a chronic renal insufficiency and it was somewhat exacerbated to a peak creatinine of 1.82.  His  baseline is in the range of 1.5-1.7.  He was not started on any Toradol or other nephrotoxic medications during the postoperative period but did have some difficulties with urinary retention requiring I/O catheterization and eventual replacement of Foley catheter.  He will require post discharge follow-up in this regard with urology and that is being arranged.  Per urology office, patient is to call for appointment after discharge. His chest tubes have had moderate drainage and have been kept in place for the first 3 postoperative days.  He has no air leak and only a small apical pneumothorax.  He has had a postoperative ileus which has shown slow steady improvement.  Diet has been advanced and bowel function is returning.  He was given a laxative on 05/26. He initially had a mild postoperative leukocytosis which was consistent with inflammatory response but it has returned to normal.  He does have a mild expected acute blood loss anemia which is showing some lower trends.  It is noted to be microcytic and he has been started on iron supplementation.

## 2021-07-20 NOTE — Op Note (Signed)
Video Bronchoscopy with Robotic Assisted Bronchoscopic Navigation   Date of Operation: 07/20/2021   Pre-op Diagnosis: Left lower lobe pulmonary nodules  Post-op Diagnosis: Left lower lobe pulmonary nodules  Surgeon: Baltazar Apo  Assistants: None  Anesthesia: General endotracheal anesthesia  Operation: Flexible video fiberoptic bronchoscopy with robotic assistance and biopsies.  Estimated Blood Loss: Minimal  Complications: None  Indications and History: [name] [name] is a 66 y.o. male with history of tobacco use, head neck cancer.  Found to have an enlarging left lower lobe superior segmental nodule hypermetabolic on PET scan.  More recent imaging 07/17/2021 also revealed a left basilar satellite nodule.  Recommendation made to achieve a tissue diagnosis and dye marking to facilitate surgical resection using robotic assisted navigational bronchoscopy. The risks, benefits, complications, treatment options and expected outcomes were discussed with the patient.  The possibilities of pneumothorax, pneumonia, reaction to medication, pulmonary aspiration, perforation of a viscus, bleeding, failure to diagnose a condition and creating a complication requiring transfusion or operation were discussed with the patient who freely signed the consent.    Description of Procedure: The patient was seen in the Preoperative Area, was examined and was deemed appropriate to proceed.  The patient was taken to [facility] endoscopy room 3, identified as [name] [name] and the procedure verified as Flexible Video Fiberoptic Bronchoscopy.  A Time Out was held and the above information confirmed.   Prior to the date of the procedure a high-resolution CT scan of the chest was performed. Utilizing ION software program a virtual tracheobronchial tree was generated to allow the creation of distinct navigation pathways to the patient's parenchymal abnormalities. After being taken to the operating room general anesthesia was  initiated and the patient  was orally intubated. The video fiberoptic bronchoscope was introduced via the endotracheal tube and a general inspection was performed which showed normal right and left lung anatomy. Aspiration of the bilateral mainstems was completed to remove any remaining secretions. Robotic catheter inserted into patient's endotracheal tube.   Target #1 left lower lobe superior segment pulmonary nodule: The distinct navigation pathways prepared prior to this procedure were then utilized to navigate to patient's lesion identified on CT scan. The robotic catheter was secured into place and the vision probe was withdrawn.  Lesion location was approximated using fluoroscopy and radial endobronchial ultrasound for peripheral targeting.  Local registration and targeting was performed using Cios three-dimensional imaging.  Under fluoroscopic guidance transbronchial needle brushings, transbronchial needle biopsies were performed to be sent for cytology and pathology.  Preliminary Quik stain cytology was consistent with malignancy.  0.5 cc of 1:1 mix of isocyanate green and methylene blue was injected at the nodule in case wedge resection is required prior to lobectomy.     At the end of the procedure a general airway inspection was performed and there was no evidence of active bleeding. The bronchoscope was removed.  The patient tolerated the procedure well. There was no significant blood loss and there were no obvious complications.  Patient will proceed to the OR for robotic VATS resection.  Please refer also to Dr. Abran Duke operative note.  Samples Target #1: 1. Transbronchial needle brushings from left lower lobe superior segment nodule 2. Transbronchial Wang needle biopsies from left lower lobe superior segment nodule  Baltazar Apo, MD, PhD 07/20/2021, 9:12 AM [facility] [phone] or if no answer before 7:00PM call [phone] For any issues after 7:00PM  please call eLink [phone]

## 2021-07-20 NOTE — H&P (Signed)
[name] [name] is an 66 y.o. male.   Chief Complaint: LLL  HPI:  66 year old gentleman with history of tobacco use, throat cancer, hypertension.  He was found to have a left superior segmental lobulated pulmonary nodule on chest imaging, underwent PET scan 04/10/2021 that showed the nodule is markedly hypermetabolic.   He has seen Dr. Kipp Brood and we are planning for primary surgical resection of the nodule.  He presents to [place] for navigational bronchoscopy, biopsies, dye marking prior to surgical resection. He reports he has been doing well.  Stable exertional dyspnea.  He has not smoked for over 1 week.  He has been working hard to quit and is motivated to do so.  Super D CT chest performed 07/17/2021 reviewed by me showed interval enlargement of a spiculated medial subpleural superior segmental left lower lobe pulmonary nodule, now 2.5 x 1.8 cm.  There is also a new 1.0 x 0.9 cm left lung base nodule, question synchronous primary versus mets.  No mediastinal or hilar lymphadenopathy.  Pulmonary function testing performed 05/15/2021 reviewed by me show moderate obstruction without a bronchodilator response, hyperinflated lung volumes and a decreased diffusion capacity.  Past Medical History:  Diagnosis Date   Arthritis    Chronic right ear pain 07/2019   Coronary artery disease    Depression 06/12/11   Buproprion per Dr. Lamonte Sakai   Drainage from ear, right 07/2019   Dyspnea    Full dentures    Fitting Per Dr. Enrique Sack   Heart murmur    History of kidney stones    Hypertension    Hypothyroidism    Oropharyngeal cancer ([place]) 2012   throat   Protein calorie malnutrition ([place]) 05/31/11   Renal insufficiency 2012   Secondary to Hx. of Cisplatin and Dhydrtion   Skin rash 01/2019   Smoking 06/12/11   1/2 pack/day   Status post chemotherapy 10/30/10 - 11/09/10    2 doses of Q 3 week Cisplatin   Status post radiation therapy 10/11/10 - 12/24/10   Bilateral Neck and Mucosa axis /  70 gray in 35  fractions   Xerostomia     Past Surgical History:  Procedure Laterality Date   APPENDECTOMY     BIOPSY TONGUE     TonsilandBaseoftongue   HERNIA REPAIR     umbilical with mesh   IR NEPHROSTOMY EXCHANGE LEFT  01/10/2017   IR NEPHROSTOMY PLACEMENT LEFT  11/15/2016   LEFT HEART CATH AND CORONARY ANGIOGRAPHY N/A 01/18/2017   Procedure: LEFT HEART CATH AND CORONARY ANGIOGRAPHY;  Surgeon: [name], MD;  Location: [facility];  Service: Cardiovascular;  Laterality: N/A;   NEPHROLITHOTOMY Left 02/11/2017   Procedure: NEPHROLITHOTOMY PERCUTANEOUS;  Surgeon: [name], MD;  Location: [facility];  Service: Urology;  Laterality: Left;   PEG TUBE REMOVAL  04/13/11   PERIPHERAL VASCULAR INTERVENTION Right 03/05/2021   Procedure: PERIPHERAL VASCULAR INTERVENTION;  Surgeon: Serafina Mitchell, MD;  Location: [facility];  Service: Cardiovascular;  Laterality: Right;  right subclavian   THROMBECTOMY BRACHIAL ARTERY Right 03/02/2021   Procedure: THROMBECTOMY RIGHT SUBCLAVIAN, BRACHIAL, AXILLA, AND RADIAL ARTERY;  Surgeon: Marty Heck, MD;  Location: [facility];  Service: Vascular;  Laterality: Right;   TONSILLECTOMY     right side   UPPER EXTREMITY ANGIOGRAPHY N/A 03/05/2021   Procedure: UPPER EXTREMITY ANGIOGRAPHY;  Surgeon: Serafina Mitchell, MD;  Location: [facility];  Service: Cardiovascular;  Laterality: N/A;    Family History  Problem Relation  Age of Onset   Cancer Father 23       colon   Cancer Sister 56       leukemia   Social History:  reports that he has been smoking cigarettes. He has a 27.50 pack-year smoking history. He has never used smokeless tobacco. He reports that he does not drink alcohol and does not use drugs.  Allergies:  Allergies  Allergen Reactions   Codeine Nausea Only    Medications Prior to Admission  Medication Sig Dispense Refill   amLODipine (NORVASC) 10 MG tablet TAKE 1 TABLET (10 MG TOTAL) BY MOUTH DAILY. 90 tablet 3   aspirin EC 81  MG EC tablet Take 1 tablet (81 mg total) by mouth daily. Swallow whole. 30 tablet 11   atorvastatin (LIPITOR) 40 MG tablet TAKE 1 TABLET (40 MG TOTAL) BY MOUTH DAILY. (Patient taking differently: Take 40 mg by mouth daily.) 90 tablet 3   Budeson-Glycopyrrol-Formoterol (BREZTRI AEROSPHERE) 160-9-4.8 MCG/ACT AERO Inhale 2 puffs into the lungs in the morning and at bedtime. (Patient taking differently: Inhale 2 puffs into the lungs daily.) 5.9 g 0   clopidogrel (PLAVIX) 75 MG tablet Take 1 tablet (75 mg total) by mouth daily. 30 tablet 6   hydrALAZINE (APRESOLINE) 25 MG tablet TAKE 2 TABLETS (50 MG TOTAL) BY MOUTH EVERY 8 (EIGHT) HOURS. 180 tablet 3   isosorbide mononitrate (IMDUR) 30 MG 24 hr tablet TAKE 1 TABLET (30 MG TOTAL) BY MOUTH DAILY. (Patient taking differently: Take 30 mg by mouth daily.) 90 tablet 3   levothyroxine (SYNTHROID) 112 MCG tablet Take 1 tablet (112 mcg total) by mouth daily. 90 tablet 3   metoprolol tartrate (LOPRESSOR) 25 MG tablet TAKE 1 TABLET (25 MG TOTAL) BY MOUTH 2 (TWO) TIMES DAILY. 180 tablet 3   tamsulosin (FLOMAX) 0.4 MG CAPS capsule Take 1 capsule (0.4 mg total) by mouth daily. 90 capsule 3   Vitamin D, Ergocalciferol, (DRISDOL) 1.25 MG (50000 UNIT) CAPS capsule TAKE 1 CAPSULE (50,000 UNITS TOTAL) BY MOUTH EVERY 7 (SEVEN) DAYS. 5 capsule 3   albuterol (VENTOLIN HFA) 108 (90 Base) MCG/ACT inhaler INHALE 2 PUFFS INTO THE LUNGS EVERY 4 (FOUR) HOURS AS NEEDED FOR WHEEZING OR SHORTNESS OF BREATH (COUGH, SHORTNESS OF BREATH OR WHEEZING.). (Patient not taking: Reported on 07/14/2021) 18 g 11   budesonide-formoterol (SYMBICORT) 80-4.5 MCG/ACT inhaler INHALE 2 PUFFS INTO THE LUNGS 2 (TWO) TIMES DAILY. (Patient not taking: Reported on 07/14/2021) 10.2 g 11   mirtazapine (REMERON SOL-TAB) 15 MG disintegrating tablet Take 1 tablet (15 mg total) by mouth at bedtime. (Patient not taking: Reported on 07/14/2021) 90 tablet 3    No results found for this or any previous visit (from the  past 48 hour(s)). No results found.  Review of Systems Doing well.  As per HPI.  No new issues.  Stable dyspnea.  Blood pressure (!) 103/55, pulse 61, temperature 98.1 F (36.7 C), temperature source Oral, resp. rate 16, height _0  (1.753 m), weight 72.6 kg, SpO2 96 %. Physical Exam  Vitals:   07/20/21 0614  BP: (!) 103/55  Pulse: 61  Resp: 16  Temp: 98.1 F (36.7 C)  TempSrc: Oral  SpO2: 96%  Weight: 72.6 kg  Height: _1  (1.753 m)  Gen: Pleasant, well-nourished, in no distress,  normal affect  ENT: No lesions,  mouth clear,  oropharynx clear, no postnasal drip  Neck: No JVD, no stridor  Lungs: No use of accessory muscles, distant but no wheeze  Cardiovascular: RRR,  heart sounds normal, no murmur or gallops, no peripheral edema  Abdomen: soft and NT, no HSM,  BS normal  Musculoskeletal: No deformities, no cyanosis or clubbing  Neuro: alert, awake, non focal  Skin: Warm, no lesions or rash  Assessment/Plan 66 year old man, history of head neck cancer, history of tobacco use, with a spiculated left superior segmental nodule consistent with primary malignancy.  Now also a 1.0 cm left basilar nodule, question synchronous primary versus local met.  He does not have any mediastinal adenopathy.  Plan for navigational bronchoscopy to characterize his nodules, dye mark in preparation for primary surgical resection.  Given the 1 cm satellite nodule suspect he will have left lower lobectomy.  Collene Gobble, MD 07/20/2021, 7:06 AM

## 2021-07-20 NOTE — Op Note (Signed)
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 The assistant was needed for exposure, dissection, suctioning, retraction of delicate tissues and sutures, instrument exchange and for overall help during this procedure.    Anesthesia  general EBL: 200 ml Blood Administration: 0 Specimen: Left lower lobe, hilar and mediastinal lymph nodes.  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  I stressed the importance of smoking cessation patient states that he would rather stop smoking as opposed to undergoing another radiation therapy treatment.  He states that today will be his last day.  He will require stress test given his coronary history.  Will speak with Dr. Lamonte Sakai about performing a combined single anesthetic biopsy, marking, and surgical resection.    Findings: Most recent CT scan showed that the patient developed satellite lesions in his lower lobe since his previous scan.  Hostile fissure.  Operative Technique: After the risks, benefits and alternatives were thoroughly discussed, the patient was brought to the operative theatre.  Anesthesia was induced,  and the patient was then placed in a right lateral decubitus position and was prepped and draped in normal sterile fashion.  An appropriate surgical pause was performed, and pre-operative antibiotics were dosed accordingly.  We began by placing our 4 robotic ports in the the 7th intercostal space targeting the hilum of the lung.  A 37mm assistant port was placed in the 9th intercostal space in the anterior axillary line.  The robot was then docked and all instruments were passed under direct visualization.    The lung was then retracted superiorly, and the inferior pulmonary ligament was divided.  The hilum was mobilized anteriorly and posteriorly.  We identified the lower lobe vein, and after careful isolation, it was divided with a vascular stapler.  We next moved to the fissure.  The artery was then divided with a vascular load stapler.  The bronchus to the lower lobe was then isolated.  After a test clamp, with good ventilation of the upper lobe, the bronchus was then divided.  The fissure was completed, and the specimen was passed into an endocatch bag.  It was removed from the anterior access site.    Lymph nodes were then sampled at hilum and mediastinum.  The chest was irrigated, and an air leak test was performed.  An intercostal nerve block was performed under direct visualization.  A 28 F chest tube was then placed, and we watch the remaining lobes re-expand.  The skin and soft tissue were closed with absorbable suture    The patient tolerated the procedure without any immediate complications, and  was transferred to the PACU in stable condition.  [name] [name] [name] [name]

## 2021-07-20 NOTE — Anesthesia Postprocedure Evaluation (Signed)
Anesthesia Post Note  Patient: [name] [name]  Procedure(s) Performed: ROBOTIC ASSISTED NAVIGATIONAL BRONCHOSCOPY BRONCHIAL NEEDLE ASPIRATION BIOPSIES BRONCHIAL BRUSHINGS FIDUCIAL DYE MARKING VIDEO BRONCHOSCOPY WITH RADIAL ENDOBRONCHIAL ULTRASOUND     Patient location during evaluation: PACU Anesthesia Type: General Level of consciousness: awake and alert Pain management: pain level controlled Vital Signs Assessment: post-procedure vital signs reviewed and stable Respiratory status: spontaneous breathing, nonlabored ventilation, respiratory function stable and patient connected to nasal cannula oxygen Cardiovascular status: blood pressure returned to baseline and stable Postop Assessment: no apparent nausea or vomiting Anesthetic complications: no   No notable events documented.  Last Vitals:  Vitals:   07/20/21 1430 07/20/21 1533  BP: 104/67   Pulse: 67   Resp: 14   Temp: (!) 36.4 C   SpO2: 92% 96%    Last Pain:  Vitals:   07/20/21 1430  TempSrc: Oral  PainSc:                  March Rummage Landyn Buckalew

## 2021-07-20 NOTE — Anesthesia Procedure Notes (Signed)
Procedure Name: Intubation Date/Time: 07/20/2021 7:55 AM Performed by: Lowella Dell, CRNA Pre-anesthesia Checklist: Patient identified, Emergency Drugs available, Suction available and Patient being monitored Patient Re-evaluated:Patient Re-evaluated prior to induction Oxygen Delivery Method: Circle System Utilized Preoxygenation: Pre-oxygenation with 100% oxygen Induction Type: IV induction Ventilation: Mask ventilation without difficulty Laryngoscope Size: Mac and 4 Tube type: Oral Tube size: 8.5 mm Number of attempts: 1 Airway Equipment and Method: Stylet Placement Confirmation: ETT inserted through vocal cords under direct vision, positive ETCO2 and breath sounds checked- equal and bilateral Secured at: 22 cm Tube secured with: Tape Dental Injury: Teeth and Oropharynx as per pre-operative assessment

## 2021-07-20 NOTE — Transfer of Care (Signed)
Immediate Anesthesia Transfer of Care Note  Patient: [name] [name]  Procedure(s) Performed: ROBOTIC ASSISTED NAVIGATIONAL BRONCHOSCOPY BRONCHIAL NEEDLE ASPIRATION BIOPSIES BRONCHIAL BRUSHINGS FIDUCIAL DYE MARKING VIDEO BRONCHOSCOPY WITH RADIAL ENDOBRONCHIAL ULTRASOUND  Patient Location: PACU  Anesthesia Type:General  Level of Consciousness: awake, drowsy and patient cooperative  Airway & Oxygen Therapy: Patient Spontanous Breathing  Post-op Assessment: Report given to RN, Post -op Vital signs reviewed and stable and Patient moving all extremities X 4  Post vital signs: Reviewed and stable  Last Vitals:  Vitals Value Taken Time  BP 117/65 07/20/21 1315  Temp 36.9 C 07/20/21 1315  Pulse 81 07/20/21 1316  Resp 24 07/20/21 1316  SpO2 97 % 07/20/21 1316  Vitals shown include unvalidated device data.  Last Pain:  Vitals:   07/20/21 0614  TempSrc: Oral         Complications: No notable events documented.

## 2021-07-20 NOTE — Brief Op Note (Signed)
07/20/2021  12:38 PM  PATIENT:  [name] [name]  66 y.o. male  PRE-OPERATIVE DIAGNOSIS:  PULMONARY NODULE  POST-OPERATIVE DIAGNOSIS:  PULMONARY NODULE  PROCEDURE:  Procedure(s): XI ROBOTIC ASSISTED THORASCOPY-LEFT LOWER LOBECTOMY (Left) INTERCOSTAL NERVE BLOCK (Left) LYMPH NODE BIOPSY (Left)  SURGEON:  Surgeon(s) and Role:    * Lightfoot, Lucile Crater, MD - Primary  PHYSICIAN ASSISTANT: Briane Birden PA-C  ASSISTANTS: none   ANESTHESIA:   general  EBL:  50 mL   BLOOD ADMINISTERED:none  DRAINS:  28 F AND PIGTAIL CHEST TUBES    LOCAL MEDICATIONS USED:  BUPIVICAINE  and OTHER EXPAREL  SPECIMEN:  Source of Specimen:  LLL AND MULT LN SAMPLES  DISPOSITION OF SPECIMEN:  PATHOLOGY  COUNTS:  YES  TOURNIQUET:  * No tourniquets in log *  DICTATION: .Dragon Dictation  PLAN OF CARE: Admit to inpatient   PATIENT DISPOSITION:  PACU - hemodynamically stable.   Delay start of Pharmacological VTE agent (>24hrs) due to surgical blood loss or risk of bleeding: NO  COMPLICATIONS: NO KNOWN

## 2021-07-20 NOTE — Interval H&P Note (Signed)
History and Physical Interval Note:  07/20/2021 7:40 AM  [name] [name]  has presented today for surgery, with the diagnosis of PULMONARY NODULE.  The various methods of treatment have been discussed with the patient and family. After consideration of risks, benefits and other options for treatment, the patient has consented to  Procedure(s): XI ROBOTIC ASSISTED THORASCOPY-LEFT LOWER LOBECTOMY (Left) as a surgical intervention.  The patient's history has been reviewed, patient examined, no change in status, stable for surgery.  I have reviewed the patient's chart and labs.  Questions were answered to the patient's satisfaction.     Alainna Stawicki [name]

## 2021-07-20 NOTE — Anesthesia Procedure Notes (Signed)
Procedure Name: Intubation Date/Time: 07/20/2021 9:24 AM Performed by: Lowella Dell, CRNA Pre-anesthesia Checklist: Patient identified, Emergency Drugs available, Suction available and Patient being monitored Patient Re-evaluated:Patient Re-evaluated prior to induction Oxygen Delivery Method: Circle System Utilized Preoxygenation: Pre-oxygenation with 100% oxygen Induction Type: IV induction Ventilation: Mask ventilation without difficulty Laryngoscope Size: Mac and 3 Grade View: Grade I Endobronchial tube: Double lumen EBT, EBT position confirmed by auscultation, EBT position confirmed by fiberoptic bronchoscope and Left and 39 Fr Number of attempts: 1 Airway Equipment and Method: Stylet Placement Confirmation: ETT inserted through vocal cords under direct vision, positive ETCO2 and breath sounds checked- equal and bilateral Secured at: 28 cm Tube secured with: Tape Dental Injury: Teeth and Oropharynx as per pre-operative assessment

## 2021-07-20 NOTE — Progress Notes (Signed)
Notified Dr. Gloris Manchester that patient did not take his Metoprolol this am and pt's BP and pulse on arrival to short stay are 103/55,pulse 61. Pt does not need to take this morning per Dr. Gloris Manchester.

## 2021-07-20 NOTE — Anesthesia Postprocedure Evaluation (Signed)
Anesthesia Post Note  Patient: [name] [name]  Procedure(s) Performed: ROBOTIC ASSISTED NAVIGATIONAL BRONCHOSCOPY BRONCHIAL NEEDLE ASPIRATION BIOPSIES BRONCHIAL BRUSHINGS FIDUCIAL DYE MARKING     Patient location during evaluation: PACU Anesthesia Type: General Level of consciousness: sedated Pain management: pain level controlled Vital Signs Assessment: post-procedure vital signs reviewed and stable Respiratory status: spontaneous breathing, respiratory function stable and patient remains intubated per anesthesia plan Cardiovascular status: stable Postop Assessment: no apparent nausea or vomiting Anesthetic complications: no Comments: Patient intubated and transported to OR 10 for thoracic surgery. Norton Blizzard, MD     No notable events documented.  Last Vitals:  Vitals:   07/20/21 0614  BP: (!) 103/55  Pulse: 61  Resp: 16  Temp: 36.7 C  SpO2: 96%    Last Pain:  Vitals:   07/20/21 0614  TempSrc: Oral                 Merlinda Frederick

## 2021-07-21 ENCOUNTER — Inpatient Hospital Stay (HOSPITAL_COMMUNITY): Payer: Medicare Other

## 2021-07-21 ENCOUNTER — Encounter (HOSPITAL_COMMUNITY): Payer: Self-pay | Admitting: Thoracic Surgery (Cardiothoracic Vascular Surgery)

## 2021-07-21 LAB — CBC
HCT: 31.6 % — ABNORMAL LOW (ref 39.0–52.0)
Hemoglobin: 8.9 g/dL — ABNORMAL LOW (ref 13.0–17.0)
MCH: 20.3 pg — ABNORMAL LOW (ref 26.0–34.0)
MCHC: 28.2 g/dL — ABNORMAL LOW (ref 30.0–36.0)
MCV: 72 fL — ABNORMAL LOW (ref 80.0–100.0)
Platelets: 190 10*3/uL (ref 150–400)
RBC: 4.39 MIL/uL (ref 4.22–5.81)
RDW: 19.9 % — ABNORMAL HIGH (ref 11.5–15.5)
WBC: 14.6 10*3/uL — ABNORMAL HIGH (ref 4.0–10.5)
nRBC: 0 % (ref 0.0–0.2)

## 2021-07-21 LAB — BASIC METABOLIC PANEL
Anion gap: 9 (ref 5–15)
BUN: 15 mg/dL (ref 8–23)
CO2: 19 mmol/L — ABNORMAL LOW (ref 22–32)
Calcium: 8.4 mg/dL — ABNORMAL LOW (ref 8.9–10.3)
Chloride: 111 mmol/L (ref 98–111)
Creatinine, Ser: 1.5 mg/dL — ABNORMAL HIGH (ref 0.61–1.24)
GFR, Estimated: 51 mL/min — ABNORMAL LOW (ref >60)
Glucose, Bld: 129 mg/dL — ABNORMAL HIGH (ref 70–99)
Potassium: 4.3 mmol/L (ref 3.5–5.1)
Sodium: 139 mmol/L (ref 135–145)

## 2021-07-21 NOTE — Discharge Summary (Addendum)
Physician Discharge Summary       [address]       [place] [address]             [phone]    Patient ID: [name] [name] MRN: [id number] DOB/AGE: [id number] 67 y.o.  Admit date: 07/20/2021 Discharge date:07/28/2021 Admission Diagnoses: Left lower lobe pulmonary nodule  Discharge Diagnoses:  S/p Xi assisted left VATS, LLL, LN dissection, and intercostal nerve block 2. Urinary retention 3. History of the following:  Arthritis     Chronic right ear pain 07/2019   Coronary artery disease     Depression 06/12/11    Buproprion per Dr. Lamonte Sakai   Drainage from ear, right 07/2019   Dyspnea     Full dentures      Fitting Per Dr. 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 of Cisplatin and Dhydrtion   Skin rash 01/2019   Smoking 06/12/11    1/2 pack/day   Status post chemotherapy 10/30/10 - 11/09/10     2 doses of Q 3 week Cisplatin   Status post radiation therapy 10/11/10 - 12/24/10    Bilateral Neck and Mucosa axis /  70 gray in 35 fractions   Xerostomia       Consults: None  Procedure (s):  Robotic assisted left video thoracoscopy -Left lower lobectomy - Mediastinal lymph node sampling - Intercostal nerve block by Dr. Roxan Hockey on 07/20/2021 by Dr. Kipp Brood.   Pathology: A. 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  Please see the synoptic report after specimen L.  Pathologic Stage Classification (pTNM, AJCC 8th Edition): pT1b, pN0   B. LYMPH NODE, 9, EXCISION:  A lymph node negative for metastatic carcinoma.   C. LYMPH NODE, HILAR #1,  EXCISION:  A lymph node negative for metastatic carcinoma.   D. LYMPH NODE, HILAR #2, EXCISION:  A lymph node with anthracotic pigmentation which is negative for  metastatic carcinoma.   E. LYMPH NODE, HILAR #3, EXCISION:  A lymph node negative for metastatic carcinoma.   F. LYMPH NODE, 9 #2, EXCISION:  A lymph node negative for metastatic carcinoma.   G. LYMPH NODE, HILAR #4, EXCISION:  A lymph node with anthracotic pigmentation which is negative for metastatic carcinoma.   H. LYMPH NODE, 7, EXCISION:  Benign fibroadipose tissue without any lymph node.   I. LYMPH NODE, LEVEL 5, EXCISION:  A lymph node with anthracotic pigmentation which is negative for metastatic carcinoma.   History of Present Illness:    [name] [name] 66 y.o. male referred by Dr. Lamonte Sakai for surgical evaluation of a 1.9 cm left lower lobe pulmonary nodule.  He has a significant smoking history and continues to smoke about half a pack a day.  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  He has been followed via lung cancer screening program and was noted to have this lesion back in January 2023.  He subsequently underwent a PET/CT which showed avidity within the nodule.   He denies any neurologic symptoms.  He occasionally has some shortness of breath.  In 2018 he did undergo a left heart catheterization  which showed disease in his LAD.  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  He is currently on Plavix and aspirin as well.  The patient and all relevant studies were reviewed by Dr. Kipp Brood who recommended proceeding with surgical intervention.  Hospital course:  Patient was admitted electively and on 07/20/2021 was initially taken to the endoscopy suite at which time he underwent flexible video fiberoptic bronchoscopy with robotic assistance and biopsies by Dr. Lamonte Sakai.  He was then transferred to the operating room and subsequently underwent robotic assisted left video thoracoscopy with left lower lobectomy, mediastinal lymph node sampling and intercostal nerve  block.  He tolerated the procedure well was taken to the postanesthesia care unit in stable condition   Postoperative hospital course:  The patient has progressed somewhat slowly.  His vital signs have remained stable but oxygen has been slow to wean.  He has significant pulmonary disease and has required aggressive pulmonary hygiene as well as nebulizers and inhalers.  He is showing some steady progress in this regard.  Patient has a chronic renal insufficiency and it was somewhat exacerbated to a peak creatinine of 1.82.  His baseline is in the range of 1.5-1.7.  He was not started on any Toradol or other nephrotoxic medications during the postoperative period but did have some difficulties with urinary retention requiring I/O catheterization and eventual replacement of Foley catheter.  He will require post discharge follow-up in this regard with urology and that is being arranged.  Per urology office, patient is to call for appointment after discharge. His chest tubes have had moderate drainage and have been kept in place for the first 3 postoperative days.  He has no air leak and only a small apical pneumothorax.  He has had a postoperative ileus which has shown slow steady improvement.  Diet has been advanced and bowel function is returning.  He was given a laxative on 05/26. He initially had a mild postoperative leukocytosis which was consistent with inflammatory response but it has returned to normal.  He does have a mild expected acute blood loss anemia which is showing some lower trends.  It is noted to be microcytic and he has been started on iron supplementation.  His chest tube was removed on 07/24/2021.  Follow up CXR showed development of left sided pneumothorax which was small.  Subsequent imaging showed stable appearance of pneumothorax and sub q emphysema.  The patient again underwent a voiding trial with removal of foley catheter.  He was unable to void and required replacement of the catheter.   Urology follow up has been arranged.  The patient's surgical wounds are healing without evidence of infection.  He is ambulating without difficulty.  He is medically stable for discharge home today.    Latest Vital Signs: Blood pressure (!) 96/55, pulse (!) 56, temperature 97.8 F (36.6 C), temperature source Oral, resp. rate 18, height 5\' 9"  (1.753 m), weight 70.4 kg, SpO2 95 %.  Physical Exam:   General appearance: alert, cooperative, and no distress Heart: regular rate and rhythm Lungs: clear to auscultation bilaterally and + sub q air on left chest Abdomen: benign Extremities: no edema or calf tenderness Wound: incis healing well   Discharge Condition:Stable and discharged to home.  Recent laboratory studies:  Lab Results  Component Value Date   WBC 9.2 07/27/2021   HGB 9.5 (L) 07/27/2021   HCT 32.0 (L) 07/27/2021   MCV 69.4 (L) 07/27/2021  PLT 261 07/27/2021   Lab Results  Component Value Date   NA 138 07/27/2021   K 3.7 07/27/2021   CL 111 07/27/2021   CO2 22 07/27/2021   CREATININE 1.50 (H) 07/27/2021   GLUCOSE 123 (H) 07/27/2021      Diagnostic Studies: DG Chest 2 View  Result Date: 07/26/2021 CLINICAL DATA:  Follow-up pneumothorax EXAM: CHEST - 2 VIEW COMPARISON:  07/25/2021 and prior studies FINDINGS: The cardiomediastinal silhouette is unchanged. A 5% LEFT apical pneumothorax is unchanged. LEFT subcutaneous emphysema has increased. LEFT LOWER lung atelectasis is not significantly changed. The RIGHT lung is clear. A RIGHT subclavian stent is again noted. No new bony abnormalities are noted. IMPRESSION: Increasing LEFT subcutaneous emphysema with unchanged 5% LEFT apical pneumothorax. Unchanged LEFT LOWER lung atelectasis. Electronically Signed   By: Margarette Canada M.D.   On: 07/26/2021 07:39   DG Chest 2 View  Result Date: 07/25/2021 CLINICAL DATA:  Follow-up pneumothorax. EXAM: CHEST - 2 VIEW COMPARISON:  07/24/2021 FINDINGS: Left apical pneumothorax appears  stable to decreased in volume in the interval. This has a thickness of 1.2 cm measured over the left apex. This is compared with 1.9 cm on the previous exam. Subcutaneous gas within the left chest wall is noted which tracks into the left axillary soft tissues. This appears increased from the previous exam. Atelectasis noted in the left lung base. IMPRESSION: 1. Stable to slight decrease in volume of left apical pneumothorax. 2. Increase in subcutaneous gas within the left chest wall. Electronically Signed   By: Kerby Moors M.D.   On: 07/25/2021 10:03   DG Chest 2 View  Result Date: 07/17/2021 CLINICAL DATA:  Preoperative, left lower lobe pulmonary nodule EXAM: CHEST - 2 VIEW COMPARISON:  03/02/2021 FINDINGS: The heart size and mediastinal contours are within normal limits. No acute abnormality of the lungs. Emphysema. Nodule of the paramedian superior segment left lower lobe is faintly appreciated, better assessed by CT. The visualized skeletal structures are unremarkable. IMPRESSION: 1. Emphysema without acute abnormality of the lungs. 2. Known pulmonary nodule of the paramedian superior segment left lower lobe is faintly appreciated, better assessed by CT. Electronically Signed   By: Delanna Ahmadi M.D.   On: 07/17/2021 17:05   DG CHEST PORT 1 VIEW  Result Date: 07/28/2021 CLINICAL DATA:  Reason for exam: subcutaneous emphysema, pneumothorax Patient reports sob and chest pains no longer present. Hx of heart murmur, htn, oropharyngeal cancer 2012. Hx of XI ROBOTIC ASSISTED THORACOSCOPY-LEFT LOWER LOBECTOMY 07/20/2021. EXAM: PORTABLE CHEST - 1 VIEW COMPARISON:  07/26/2021 FINDINGS: Left lower lung scattered airspace opacities with slightly improved since prior study. Blunting of left lateral costophrenic angle. Right lung clear. Heart size and mediastinal contours are within normal limits. Aortic Atherosclerosis (ICD10-170.0). Small left apical pneumothorax, apex projecting just inferior to the posterior  aspect left second rib. Slight improvement in left lateral subcutaneous emphysema. Visualized bones unremarkable. IMPRESSION: 1. Stable small left apical pneumothorax 2. Improving opacities at the left lung base. Electronically Signed   By: Lucrezia Europe M.D.   On: 07/28/2021 08:08   DG Chest Port 1 View  Result Date: 07/24/2021 CLINICAL DATA:  Pneumothorax EXAM: PORTABLE CHEST 1 VIEW COMPARISON:  Chest x-ray dated Jul 24, 2021 FINDINGS: The heart size and mediastinal contours are within normal limits. Small left apical pneumothorax is increased in size status post chest tube removal. Mild basilar opacities, likely due to atelectasis. The visualized skeletal structures are unremarkable. IMPRESSION: Small left apical pneumothorax is increased in  size status post removal of chest tubes. These results will be called to the ordering clinician or representative by the Radiologist Assistant, and communication documented in the PACS or [employer]. Electronically Signed   By: Yetta Glassman M.D.   On: 07/24/2021 14:54   DG CHEST PORT 1 VIEW  Result Date: 07/24/2021 CLINICAL DATA:  Pneumothorax, coronary artery disease, hypertension, smoker EXAM: PORTABLE CHEST 1 VIEW COMPARISON:  Portable exam 0800 hours compared to 07/23/2021 FINDINGS: Pair of LEFT thoracostomy tubes unchanged. Normal heart size, mediastinal contours, and pulmonary vascularity. Atherosclerotic calcification aorta. Mild opacity at LEFT base may represent a combination of atelectasis and minimal effusion. Persistent tiny residual LEFT apex pneumothorax. Underlying COPD changes. Bones demineralized. IMPRESSION: Persistent tiny LEFT apex pneumothorax despite thoracostomy tubes. COPD changes with question mild atelectasis and potentially minimal LEFT pleural effusion. Electronically Signed   By: Lavonia Dana M.D.   On: 07/24/2021 09:00   DG CHEST PORT 1 VIEW  Result Date: 07/23/2021 CLINICAL DATA:  Status post left lobectomy EXAM: PORTABLE CHEST  1 VIEW COMPARISON:  Chest x-ray dated Jul 22, 2021 FINDINGS: Cardiac and mediastinal contours are unchanged. Mild opacities of the left lower lung, likely atelectasis. Trace left apical pneumothorax with 2 chest tubes in place. No large pleural effusion. IMPRESSION: Trace left apical pneumothorax with chest tubes in place. Electronically Signed   By: Yetta Glassman M.D.   On: 07/23/2021 08:24   DG CHEST PORT 1 VIEW  Result Date: 07/22/2021 CLINICAL DATA:  Post lobectomy EXAM: PORTABLE CHEST 1 VIEW COMPARISON:  07/21/2021 FINDINGS: Two left chest tubes remain present. Pneumothorax is not definitely identified on this study. Lung aeration is unchanged with volume loss in the left chest. Stable cardiomediastinal contours. Persistent subcutaneous emphysema in the left chest wall. IMPRESSION: No definite pneumothorax.  Stable lung aeration. Electronically Signed   By: Macy Mis M.D.   On: 07/22/2021 08:11   DG Chest Port 1 View  Result Date: 07/21/2021 CLINICAL DATA:  Left lower lobectomy, dyspnea and chest pain EXAM: PORTABLE CHEST 1 VIEW COMPARISON:  Chest radiograph from one day prior. FINDINGS: Stable position of 2 left chest tubes. Increased subcutaneous emphysema in the lateral lower left chest wall. Stable cardiomediastinal silhouette with normal heart size. Small left apical pneumothorax, less than 5%, slightly decreased. No right pneumothorax. No pleural effusion. Stable volume loss in the left hemithorax with mild bibasilar atelectasis. No pulmonary edema. IMPRESSION: 1. Small left apical pneumothorax, slightly decreased. Stable position of 2 left chest tubes. 2. Stable volume loss in the left hemithorax with mild bibasilar atelectasis. Electronically Signed   By: Ilona Sorrel M.D.   On: 07/21/2021 08:26   DG Chest Port 1 View  Result Date: 07/20/2021 CLINICAL DATA:  Status post LEFT LOWER lobectomy. EXAM: PORTABLE CHEST 1 VIEW COMPARISON:  None Available. FINDINGS: LEFT lung volume loss  noted compatible with recent LOWER lobectomy. 2 LEFT thoracostomy tubes are present. A small LEFT apical pneumothorax is present, 5-10%. Mild LEFT basilar atelectasis is noted. The RIGHT lung is clear. IMPRESSION: 1. Small LEFT apical pneumothorax, 5-10%. Two LEFT thoracostomy tubes in place. 2. Evidence of LEFT lung surgery and mild LEFT basilar atelectasis. Electronically Signed   By: Margarette Canada M.D.   On: 07/20/2021 13:51   Myocardial Perfusion Imaging  Result Date: 07/09/2021   Lexiscan stress is electrically negative for ischemia   Myoview scan with thinning and decreaed tracer activity in the inferoseptal wall (base, mid) inferior wall (mid) consistent with probable soft tissue  attenuation (diaphragm)   No significant ischemia or scar .   LVEF is 63%  with normal wall  motion   OVerall low risk scan   CT Super D Chest Wo Contrast  Result Date: 07/17/2021 CLINICAL DATA:  Hypermetabolic left lower lobe pulmonary nodule most consistent with primary lung malignancy, bronchoscopy planned * Tracking Code: BO * EXAM: CT CHEST WITHOUT CONTRAST TECHNIQUE: Multidetector CT imaging of the chest was performed using thin slice collimation for electromagnetic bronchoscopy planning purposes, without intravenous contrast. RADIATION DOSE REDUCTION: This exam was performed according to the departmental dose-optimization program which includes automated exposure control, adjustment of the mA and/or kV according to patient size and/or use of iterative reconstruction technique. COMPARISON:  PET-CT, 04/10/2021 FINDINGS: Cardiovascular: Aortic atherosclerosis. Aortic valve calcifications. Normal heart size. Three-vessel coronary artery calcifications. No pericardial effusion. Mediastinum/Nodes: No enlarged mediastinal, hilar, or axillary lymph nodes. Saber sheath trachea. Thyroid and esophagus demonstrate no significant findings. Lungs/Pleura: Moderate to severe centrilobular emphysema. Diffuse bilateral bronchial wall  thickening. Interval enlargement of a spiculated pulmonary nodule of the medial, subpleural superior segment left lower lobe, measuring 2.5 x 1.8 cm, previously 1.8 x 1.6 cm when measured similarly. This now more clearly tents the adjacent pleura (series 3, image 49). There is additionally a new, finely spiculated nodule of the left lung base abutting the diaphragm measuring 1.0 x 0.9 cm (series 3, image 121). No pleural effusion or pneumothorax. Upper Abdomen: No acute abnormality. Musculoskeletal: No chest wall abnormality. No suspicious osseous lesions identified. IMPRESSION: 1. Interval enlargement of a spiculated pulmonary nodule of the medial, subpleural superior segment left lower lobe, measuring 2.5 x 1.8 cm, previously 1.8 x 1.6 cm. This now more clearly tents the adjacent pleura and is consistent with an enlarging primary lung malignancy. 2. There is additionally a new, finely spiculated nodule of the left lung base abutting the diaphragm measuring 1.0 x 0.9 cm. This is highly suspicious for a small metastasis or metachronous primary malignancy. 3. Emphysema and diffuse bilateral bronchial wall thickening. 4. Coronary artery disease. 5. Aortic valve calcifications. Correlate for echocardiographic evidence of aortic valve dysfunction. Aortic Atherosclerosis (ICD10-I70.0) and Emphysema (ICD10-J43.9). Electronically Signed   By: Delanna Ahmadi M.D.   On: 07/17/2021 20:16   DG C-ARM BRONCHOSCOPY  Result Date: 07/20/2021 C-ARM BRONCHOSCOPY: Fluoroscopy was utilized by the requesting physician.  No radiographic interpretation.       Discharge Instructions     Discharge patient   Complete by: As directed    Foley catheter leg bag   Discharge disposition: 01-Home or Self Care   Discharge patient date: 07/28/2021       Discharge Medications: Allergies as of 07/28/2021       Reactions   Codeine Nausea Only        Medication List     TAKE these medications    albuterol 108 (90 Base)  MCG/ACT inhaler Commonly known as: VENTOLIN HFA INHALE 2 PUFFS INTO THE LUNGS EVERY 4 (FOUR) HOURS AS NEEDED FOR WHEEZING OR SHORTNESS OF BREATH (COUGH, SHORTNESS OF BREATH OR WHEEZING.).   amLODipine 5 MG tablet Commonly known as: NORVASC Take 1 tablet (5 mg total) by mouth daily. What changed:  medication strength how much to take   aspirin EC 81 MG tablet Take 1 tablet (81 mg total) by mouth daily. Swallow whole.   atorvastatin 40 MG tablet Commonly known as: LIPITOR TAKE 1 TABLET (40 MG TOTAL) BY MOUTH DAILY. What changed: how much to take   [facility] Aerosphere 160-9-4.8  MCG/ACT Aero Generic drug: Budeson-Glycopyrrol-Formoterol Inhale 2 puffs into the lungs in the morning and at bedtime. What changed: when to take this   clopidogrel 75 MG tablet Commonly known as: PLAVIX Take 1 tablet (75 mg total) by mouth daily.   ferrous [id number] C-folic acid capsule Commonly known as: TRINSICON / FOLTRIN Take 1 capsule by mouth 2 (two) times daily after a meal.   hydrALAZINE 25 MG tablet Commonly known as: APRESOLINE TAKE 2 TABLETS (50 MG TOTAL) BY MOUTH EVERY 8 (EIGHT) HOURS.   isosorbide mononitrate 30 MG 24 hr tablet Commonly known as: IMDUR TAKE 1 TABLET (30 MG TOTAL) BY MOUTH DAILY. What changed:  how much to take when to take this   levothyroxine 112 MCG tablet Commonly known as: Synthroid Take 1 tablet (112 mcg total) by mouth daily.   metoprolol tartrate 25 MG tablet Commonly known as: LOPRESSOR TAKE 1 TABLET (25 MG TOTAL) BY MOUTH 2 (TWO) TIMES DAILY.   mirtazapine 15 MG disintegrating tablet Commonly known as: REMERON SOL-TAB Take 1 tablet (15 mg total) by mouth at bedtime.   oxyCODONE 5 MG immediate release tablet Commonly known as: Oxy IR/ROXICODONE Take 1 tablet (5 mg total) by mouth every 6 (six) hours as needed for moderate pain or severe pain.   Symbicort 80-4.5 MCG/ACT inhaler Generic drug: budesonide-formoterol INHALE 2 PUFFS INTO THE  LUNGS 2 (TWO) TIMES DAILY.   tamsulosin 0.4 MG Caps capsule Commonly known as: FLOMAX Take 1 capsule (0.4 mg total) by mouth daily.   Vitamin D (Ergocalciferol) 1.25 MG (50000 UNIT) Caps capsule Commonly known as: DRISDOL TAKE 1 CAPSULE (50,000 UNITS TOTAL) BY MOUTH EVERY 7 (SEVEN) DAYS.        Follow Up Appointments:  Follow-up Information     Lightfoot, Lucile Crater, MD Follow up.   Specialty: Cardiothoracic Surgery Why: Please see discharge paperwork for follow-up appointment with Dr. Kipp Brood.  On the date you see Dr. Kipp Brood obtain a chest x-ray at [facility] 1/2-hour prior to the appointment.  It is located in the same office complex on the first floor. Contact information: [address] [place] [address] [phone]         Arma Heading, MD Follow up.   Specialty: Urology Why: Appointment is at 3:45 PM on August 06, 2021.  Please arrive at least 20 minutes early to fill out paperwork. Contact information: [place] [address] [phone]                 Signed: Gaspar Bidding 07/30/2021, 1:18 PM

## 2021-07-21 NOTE — Progress Notes (Signed)
[address]       ,[place] [address]             [phone]      1 Day Post-Op Procedure(s) (LRB): XI ROBOTIC ASSISTED THORACOSCOPY-LEFT LOWER LOBECTOMY (Left) INTERCOSTAL NERVE BLOCK (Left) LYMPH NODE BIOPSY (Left) Subjective: Mod pain, + SOB but improving  Objective: Vital signs in last 24 hours: Temp:  [97.5 F (36.4 C)-98.6 F (37 C)] 98.6 F (37 C) (05/23 0400) Pulse Rate:  [67-95] 76 (05/23 0400) Cardiac Rhythm: Normal sinus rhythm (05/22 1905) Resp:  [12-21] 19 (05/23 0400) BP: (102-130)/(63-97) 127/66 (05/23 0542) SpO2:  [91 %-100 %] 93 % (05/23 0400)  Hemodynamic parameters for last 24 hours:    Intake/Output from previous day: 05/22 0701 - 05/23 0700 In: 3340 [P.O.:240; I.V.:2900; IV Piggyback:200] Out: 2920 [Urine:2390; Blood:50; Chest Tube:480] Intake/Output this shift: No intake/output data recorded.  General appearance: alert, cooperative, and no distress Heart: regular rate and rhythm Lungs: coarse with scattered ronchi, some wheezing Abdomen: soft, non tender Extremities: PAS in place Wound: healing well  Lab Results: No results for input(s): WBC, HGB, HCT, PLT in the last 72 hours. BMET:  Recent Labs    07/21/21 0055  NA 139  K 4.3  CL 111  CO2 19*  GLUCOSE 129*  BUN 15  CREATININE 1.50*  CALCIUM 8.4*    PT/INR: No results for input(s): LABPROT, INR in the last 72 hours. ABG    Component Value Date/Time   PHART 7.4 07/16/2021 1402   HCO3 19.2 (L) 07/16/2021 1402   TCO2 17 (L) 03/02/2021 1811   ACIDBASEDEF 4.5 (H) 07/16/2021 1402   O2SAT 97.2 07/16/2021 1402   CBG (last 3)  No results for input(s): GLUCAP in the last 72 hours.  Meds Scheduled Meds:  acetaminophen  1,000 mg Oral Q6H   Or   acetaminophen (TYLENOL) oral liquid 160 mg/5 mL  1,000 mg Oral Q6H   albuterol  2.5 mg Nebulization Q4H while awake   amLODipine  10 mg Oral Daily   aspirin EC  81 mg Oral Daily   atorvastatin  40 mg Oral Daily    bisacodyl  10 mg Oral Daily   clopidogrel  75 mg Oral Daily   enoxaparin (LOVENOX) injection  40 mg Subcutaneous Daily   hydrALAZINE  50 mg Oral Q8H   isosorbide mononitrate  30 mg Oral Daily   levothyroxine  112 mcg Oral Q0600   metoprolol tartrate  25 mg Oral BID   mometasone-formoterol  2 puff Inhalation BID   And   umeclidinium bromide  1 puff Inhalation Daily   senna-docusate  1 tablet Oral QHS   tamsulosin  0.4 mg Oral Daily   Continuous Infusions: PRN Meds:.fentaNYL (SUBLIMAZE) injection, ondansetron (ZOFRAN) IV, oxyCODONE, traMADol  Xrays DG Chest Port 1 View  Result Date: 07/20/2021 CLINICAL DATA:  Status post LEFT LOWER lobectomy. EXAM: PORTABLE CHEST 1 VIEW COMPARISON:  None Available. FINDINGS: LEFT lung volume loss noted compatible with recent LOWER lobectomy. 2 LEFT thoracostomy tubes are present. A small LEFT apical pneumothorax is present, 5-10%. Mild LEFT basilar atelectasis is noted. The RIGHT lung is clear. IMPRESSION: 1. Small LEFT apical pneumothorax, 5-10%. Two LEFT thoracostomy tubes in place. 2. Evidence of LEFT lung surgery and mild LEFT basilar atelectasis. Electronically Signed   By: Margarette Canada M.D.   On: 07/20/2021 13:51   DG C-ARM BRONCHOSCOPY  Result Date: 07/20/2021 C-ARM BRONCHOSCOPY: Fluoroscopy was utilized by the requesting  physician.  No radiographic interpretation.    Assessment/Plan: S/P Procedure(s) (LRB): XI ROBOTIC ASSISTED THORACOSCOPY-LEFT LOWER LOBECTOMY (Left) INTERCOSTAL NERVE BLOCK (Left) LYMPH NODE BIOPSY (Left)  POD#1 1 afeb, VSS, sinus rhythm 2 sats good on 2 liters 3 CT 480 cc, small air leak with cough, leave inplace for now 4 good UOP, creat 1.5- baseline 5 CBC pending 6 CXR clear lung fields, no pntx 7 aggressive pulm hygiene/pulm RX meds, add flutter valve 8 rehab/ambulate as able     LOS: 1 day    [name] PA-C Pager [phone] 07/21/2021    Agree with above Continue IS/Ambulation Watch creat  [name] [name]

## 2021-07-21 NOTE — Plan of Care (Signed)

## 2021-07-21 NOTE — Progress Notes (Signed)
Mobility Specialist Progress Note    07/21/21 1640  Mobility  Activity Ambulated with assistance in hallway  Level of Assistance Contact guard assist, steadying assist  Assistive Device Front wheel walker  Distance Ambulated (ft) 50 ft  Activity Response Tolerated fair  $Mobility charge 1 Mobility   Pre-Mobility: 83 HR, 94% SpO2 Post-Mobility: 83 HR, 97% SpO2  Pt received in bed and agreeable. On 3LO2. Encouraged focus on pursed lip breathing. Returned to chair with call bell in reach and family present.   Hildred Alamin Mobility Specialist  Primary: 5N M.S. Phone: [phone] Secondary: 6N M.S. Phone: [phone]

## 2021-07-22 ENCOUNTER — Inpatient Hospital Stay (HOSPITAL_COMMUNITY): Payer: Medicare Other

## 2021-07-22 LAB — CBC
HCT: 30.7 % — ABNORMAL LOW (ref 39.0–52.0)
Hemoglobin: 8.9 g/dL — ABNORMAL LOW (ref 13.0–17.0)
MCH: 20.3 pg — ABNORMAL LOW (ref 26.0–34.0)
MCHC: 29 g/dL — ABNORMAL LOW (ref 30.0–36.0)
MCV: 70.1 fL — ABNORMAL LOW (ref 80.0–100.0)
Platelets: 223 10*3/uL (ref 150–400)
RBC: 4.38 MIL/uL (ref 4.22–5.81)
RDW: 19.7 % — ABNORMAL HIGH (ref 11.5–15.5)
WBC: 14.6 10*3/uL — ABNORMAL HIGH (ref 4.0–10.5)
nRBC: 0 % (ref 0.0–0.2)

## 2021-07-22 LAB — COMPREHENSIVE METABOLIC PANEL
ALT: 14 U/L (ref 0–44)
AST: 15 U/L (ref 15–41)
Albumin: 2.8 g/dL — ABNORMAL LOW (ref 3.5–5.0)
Alkaline Phosphatase: 71 U/L (ref 38–126)
Anion gap: 9 (ref 5–15)
BUN: 24 mg/dL — ABNORMAL HIGH (ref 8–23)
CO2: 20 mmol/L — ABNORMAL LOW (ref 22–32)
Calcium: 8.6 mg/dL — ABNORMAL LOW (ref 8.9–10.3)
Chloride: 109 mmol/L (ref 98–111)
Creatinine, Ser: 1.94 mg/dL — ABNORMAL HIGH (ref 0.61–1.24)
GFR, Estimated: 37 mL/min — ABNORMAL LOW (ref >60)
Glucose, Bld: 121 mg/dL — ABNORMAL HIGH (ref 70–99)
Potassium: 4.6 mmol/L (ref 3.5–5.1)
Sodium: 138 mmol/L (ref 135–145)
Total Bilirubin: 0.7 mg/dL (ref 0.3–1.2)
Total Protein: 5.7 g/dL — ABNORMAL LOW (ref 6.5–8.1)

## 2021-07-22 LAB — CYTOLOGY - NON PAP

## 2021-07-22 MED ORDER — ALBUTEROL SULFATE (2.5 MG/3ML) 0.083% IN NEBU
2.5000 mg | INHALATION_SOLUTION | Freq: Three times a day (TID) | RESPIRATORY_TRACT | Status: DC
  Administered 2021-07-22 – 2021-07-27 (×12): 2.5 mg via RESPIRATORY_TRACT
  Filled 2021-07-22 (×14): qty 3

## 2021-07-22 NOTE — TOC Progression Note (Signed)
Transition of Care [facility]) - Progression Note    Patient Details  Name: [name] [name] MRN: [id number] Date of Birth: 09-12-1955  Transition of Care [facility]) CM/SW [place], RN Phone Number:[phone]  07/22/2021, 4:06 PM  Clinical Narrative:     Transition of Care (TOC) Screening Note   Patient Details  Name: [name] [name] Date of Birth: May 12, 1955   Transition of Care [facility]) CM/SW Contact:    Angelita Ingles, RN Phone Number: 07/22/2021, 4:06 PM    Transition of Care Department [facility]) has reviewed patient and no TOC needs have been identified at this time. We will continue to monitor patient advancement through interdisciplinary progression rounds. TOC acknowledges that TOC needs may arise. Amd that patient is high risk for readmission. TOC will follow with high rick assessment.         Expected Discharge Plan and Services                                                 Social Determinants of Health (SDOH) Interventions    Readmission Risk Interventions     View : No data to display.

## 2021-07-22 NOTE — Progress Notes (Addendum)
[address]       [place] [address]             [phone]      2 Days Post-Op Procedure(s) (LRB): XI ROBOTIC ASSISTED THORACOSCOPY-LEFT LOWER LOBECTOMY (Left) INTERCOSTAL NERVE BLOCK (Left) LYMPH NODE BIOPSY (Left) Subjective: Feels fair overall  Objective: Vital signs in last 24 hours: Temp:  [97.5 F (36.4 C)-98.7 F (37.1 C)] 97.8 F (36.6 C) (05/24 0408) Pulse Rate:  [64-94] 70 (05/24 0408) Cardiac Rhythm: Normal sinus rhythm (05/23 1913) Resp:  [15-25] 16 (05/24 0408) BP: (93-144)/(63-91) 94/66 (05/24 0535) SpO2:  [88 %-98 %] 94 % (05/24 0408)  Hemodynamic parameters for last 24 hours:    Intake/Output from previous day: 05/23 0701 - 05/24 0700 In: 240 [P.O.:240] Out: 1550 [Urine:1050; Chest Tube:500] Intake/Output this shift: No intake/output data recorded.  General appearance: alert, cooperative, and no distress Heart: regular rate and rhythm Lungs: coarse BS throughout Abdomen: + BS, mod distension, non tender Extremities: no edema or calf tenderness Wound: healing well  Lab Results: Recent Labs    07/21/21 0055 07/22/21 0104  WBC 14.6* 14.6*  HGB 8.9* 8.9*  HCT 31.6* 30.7*  PLT 190 223   BMET:  Recent Labs    07/21/21 0055 07/22/21 0104  NA 139 138  K 4.3 4.6  CL 111 109  CO2 19* 20*  GLUCOSE 129* 121*  BUN 15 24*  CREATININE 1.50* 1.94*  CALCIUM 8.4* 8.6*    PT/INR: No results for input(s): LABPROT, INR in the last 72 hours. ABG    Component Value Date/Time   PHART 7.4 07/16/2021 1402   HCO3 19.2 (L) 07/16/2021 1402   TCO2 17 (L) 03/02/2021 1811   ACIDBASEDEF 4.5 (H) 07/16/2021 1402   O2SAT 97.2 07/16/2021 1402   CBG (last 3)  No results for input(s): GLUCAP in the last 72 hours.  Meds Scheduled Meds:  acetaminophen  1,000 mg Oral Q6H   Or   acetaminophen (TYLENOL) oral liquid 160 mg/5 mL  1,000 mg Oral Q6H   albuterol  2.5 mg Nebulization Q4H while awake   amLODipine  10 mg Oral Daily    aspirin EC  81 mg Oral Daily   atorvastatin  40 mg Oral Daily   bisacodyl  10 mg Oral Daily   clopidogrel  75 mg Oral Daily   enoxaparin (LOVENOX) injection  40 mg Subcutaneous Daily   hydrALAZINE  50 mg Oral Q8H   isosorbide mononitrate  30 mg Oral Daily   levothyroxine  112 mcg Oral Q0600   metoprolol tartrate  25 mg Oral BID   mometasone-formoterol  2 puff Inhalation BID   And   umeclidinium bromide  1 puff Inhalation Daily   senna-docusate  1 tablet Oral QHS   tamsulosin  0.4 mg Oral Daily   Continuous Infusions: PRN Meds:.fentaNYL (SUBLIMAZE) injection, ondansetron (ZOFRAN) IV, oxyCODONE, traMADol  Xrays DG Chest Port 1 View  Result Date: 07/21/2021 CLINICAL DATA:  Left lower lobectomy, dyspnea and chest pain EXAM: PORTABLE CHEST 1 VIEW COMPARISON:  Chest radiograph from one day prior. FINDINGS: Stable position of 2 left chest tubes. Increased subcutaneous emphysema in the lateral lower left chest wall. Stable cardiomediastinal silhouette with normal heart size. Small left apical pneumothorax, less than 5%, slightly decreased. No right pneumothorax. No pleural effusion. Stable volume loss in the left hemithorax with mild bibasilar atelectasis. No pulmonary edema. IMPRESSION: 1. Small left apical pneumothorax, slightly decreased. Stable position of 2  left chest tubes. 2. Stable volume loss in the left hemithorax with mild bibasilar atelectasis. Electronically Signed   By: Ilona Sorrel M.D.   On: 07/21/2021 08:26   DG Chest Port 1 View  Result Date: 07/20/2021 CLINICAL DATA:  Status post LEFT LOWER lobectomy. EXAM: PORTABLE CHEST 1 VIEW COMPARISON:  None Available. FINDINGS: LEFT lung volume loss noted compatible with recent LOWER lobectomy. 2 LEFT thoracostomy tubes are present. A small LEFT apical pneumothorax is present, 5-10%. Mild LEFT basilar atelectasis is noted. The RIGHT lung is clear. IMPRESSION: 1. Small LEFT apical pneumothorax, 5-10%. Two LEFT thoracostomy tubes in place. 2.  Evidence of LEFT lung surgery and mild LEFT basilar atelectasis. Electronically Signed   By: Margarette Canada M.D.   On: 07/20/2021 13:51   DG C-ARM BRONCHOSCOPY  Result Date: 07/20/2021 C-ARM BRONCHOSCOPY: Fluoroscopy was utilized by the requesting physician.  No radiographic interpretation.    Assessment/Plan: S/P Procedure(s) (LRB): XI ROBOTIC ASSISTED THORACOSCOPY-LEFT LOWER LOBECTOMY (Left) INTERCOSTAL NERVE BLOCK (Left) LYMPH NODE BIOPSY (Left)   POD#2 1 afeb, VSS, BP 90's-140's , mostly well controlled 2 sats good on 3 liters 3 CT drainage decreasing- 140 /last 12 hours, poss d/c chest tube and leave the pigtail catheter- no air leak noted 4 required I/O cath for urinary retention last pm 5 creat increased to 1.9. baseline 1.5-1.7- could be related to urinary retention, no new nephrotoxic meds have been ordered. No h/o prostate issues per patient but is on flomax,  has had kidney stones multiple time( no kidney stone type pain currently) 6 leukocytosis is stable- no fevers, most likely systemic inflammatory response 7 CXR - small apical pntx, sub q air stable, minor atx 8 Ileus- + BS but no flatus- slow with diet as had some nausea 9 routine rehab and pulm hygiene, cont resp meds   LOS: 2 days    [name] PA-C Pager [phone] 07/22/2021    Agree with above Post-obstructive AKI.  Will continue to monitor voiding.  Started on flomax Will keep Cts for now  [employer]

## 2021-07-22 NOTE — Progress Notes (Signed)
Patient informed RN that he was unable to void urine since the morning on 07/21/21. Bladder scan on patient revealed 686mls in bladder. Per post foley protocol, In and out  times one catheter order placed. Patient tolerated procedure well and net total 1062mls urine removed from patients bladder. Urine was blood tinged at first and became clear as being removed. No foul odor noted. RN informed patient to use call light when he feels the urge to urinate. Patient now resting comfortably in bed with call light within reach.

## 2021-07-22 NOTE — Progress Notes (Signed)
Mobility Specialist Progress Note    07/22/21 1036  Mobility  Activity Ambulated with assistance in hallway  Level of Assistance Contact guard assist, steadying assist  Assistive Device Front wheel walker  Distance Ambulated (ft) 70 ft  Activity Response Tolerated fair  $Mobility charge 1 Mobility   Pre-Mobility: 76 HR, 110/70 BP, 92% on 4L SpO2 During Mobility: 86 HR Post-Mobility: 83 HR, 94% on 4L SpO2  Pt received in bed and agreeable. C/o pain and groaning throughout ambulation. Took multiple short standing rest breaks stating he needed to catch his breath. SpO2 reading in 80s without a reliable pleth so ambulated on 6LO2. Returned to chair and encouraged pt to sit up for at least one hour. On 4LO2 in room. RN notified.   Hildred Alamin Mobility Specialist  Primary: 5N M.S. Phone: [phone] Secondary: 6N M.S. Phone: [phone]

## 2021-07-22 NOTE — Care Management Important Message (Signed)
Important Message  Patient Details  Name: [name] [name] MRN: [id number] Date of Birth: 05/05/1955   Medicare Important Message Given:  Yes     Sylvia Helms 07/22/2021, 2:00 PM

## 2021-07-23 ENCOUNTER — Inpatient Hospital Stay (HOSPITAL_COMMUNITY): Payer: Medicare Other

## 2021-07-23 LAB — CBC
HCT: 28.2 % — ABNORMAL LOW (ref 39.0–52.0)
Hemoglobin: 7.9 g/dL — ABNORMAL LOW (ref 13.0–17.0)
MCH: 19.9 pg — ABNORMAL LOW (ref 26.0–34.0)
MCHC: 28 g/dL — ABNORMAL LOW (ref 30.0–36.0)
MCV: 71.2 fL — ABNORMAL LOW (ref 80.0–100.0)
Platelets: 218 10*3/uL (ref 150–400)
RBC: 3.96 MIL/uL — ABNORMAL LOW (ref 4.22–5.81)
RDW: 19.7 % — ABNORMAL HIGH (ref 11.5–15.5)
WBC: 10.7 10*3/uL — ABNORMAL HIGH (ref 4.0–10.5)
nRBC: 0 % (ref 0.0–0.2)

## 2021-07-23 LAB — BASIC METABOLIC PANEL
Anion gap: 8 (ref 5–15)
BUN: 30 mg/dL — ABNORMAL HIGH (ref 8–23)
CO2: 21 mmol/L — ABNORMAL LOW (ref 22–32)
Calcium: 8.3 mg/dL — ABNORMAL LOW (ref 8.9–10.3)
Chloride: 110 mmol/L (ref 98–111)
Creatinine, Ser: 1.82 mg/dL — ABNORMAL HIGH (ref 0.61–1.24)
GFR, Estimated: 40 mL/min — ABNORMAL LOW (ref >60)
Glucose, Bld: 99 mg/dL (ref 70–99)
Potassium: 3.8 mmol/L (ref 3.5–5.1)
Sodium: 139 mmol/L (ref 135–145)

## 2021-07-23 LAB — SURGICAL PATHOLOGY

## 2021-07-23 MED ORDER — FE FUMARATE-B12-VIT C-FA-IFC PO CAPS
1.0000 | ORAL_CAPSULE | Freq: Two times a day (BID) | ORAL | Status: DC
  Administered 2021-07-23 – 2021-07-28 (×11): 1 via ORAL
  Filled 2021-07-23 (×12): qty 1

## 2021-07-23 MED ORDER — CHLORHEXIDINE GLUCONATE CLOTH 2 % EX PADS
6.0000 | MEDICATED_PAD | Freq: Every day | CUTANEOUS | Status: DC
  Administered 2021-07-23 – 2021-07-28 (×5): 6 via TOPICAL

## 2021-07-23 NOTE — Progress Notes (Signed)
Mobility Specialist Progress Note    07/23/21 1045  Mobility  Activity Ambulated with assistance in hallway  Level of Assistance Standby assist, set-up cues, supervision of patient - no hands on  Assistive Device Front wheel walker  Distance Ambulated (ft) 200 ft  Activity Response Tolerated well  $Mobility charge 1 Mobility   Pre-Mobility: 66 HR, 98% on 4L SpO2 Post-Mobility: 74 HR, 96% on 2L SpO2  Pt received in bed and agreeable. Groaning in pain during, but less than yesterday. Able to wean pt down to 2LO2. Returned to chair with call bell in reach.    Hildred Alamin Mobility Specialist  Primary: 5N M.S. Phone: [phone] Secondary: 6N M.S. Phone: [phone]

## 2021-07-23 NOTE — Progress Notes (Signed)
[address]       [place] [address]             [phone]      3 Days Post-Op Procedure(s) (LRB): XI ROBOTIC ASSISTED THORACOSCOPY-LEFT LOWER LOBECTOMY (Left) INTERCOSTAL NERVE BLOCK (Left) LYMPH NODE BIOPSY (Left) Subjective: Feeling better , did have to get I/O cath again- breating more comfortable, less cough/sputum  Objective: Vital signs in last 24 hours: Temp:  [97.6 F (36.4 C)-98.3 F (36.8 C)] 97.6 F (36.4 C) (05/25 0405) Pulse Rate:  [60-77] 60 (05/25 0405) Cardiac Rhythm: Normal sinus rhythm (05/25 0701) Resp:  [17-19] 17 (05/25 0405) BP: (81-125)/(60-71) 125/65 (05/25 0644) SpO2:  [92 %-100 %] 99 % (05/25 0405)  Hemodynamic parameters for last 24 hours:    Intake/Output from previous day: 05/24 0701 - 05/25 0700 In: 720 [P.O.:720] Out: 1845 [Urine:1525; Chest Tube:320] Intake/Output this shift: No intake/output data recorded.  General appearance: alert, cooperative, fatigued, and no distress Heart: regular rate and rhythm Lungs: less coarse, less ronchi Abdomen: some distension, + BS, non tender Extremities: no edema or calf tenderness Wound: incis healing well  Lab Results: Recent Labs    07/22/21 0104 07/23/21 0116  WBC 14.6* 10.7*  HGB 8.9* 7.9*  HCT 30.7* 28.2*  PLT 223 218   BMET:  Recent Labs    07/22/21 0104 07/23/21 0116  NA 138 139  K 4.6 3.8  CL 109 110  CO2 20* 21*  GLUCOSE 121* 99  BUN 24* 30*  CREATININE 1.94* 1.82*  CALCIUM 8.6* 8.3*    PT/INR: No results for input(s): LABPROT, INR in the last 72 hours. ABG    Component Value Date/Time   PHART 7.4 07/16/2021 1402   HCO3 19.2 (L) 07/16/2021 1402   TCO2 17 (L) 03/02/2021 1811   ACIDBASEDEF 4.5 (H) 07/16/2021 1402   O2SAT 97.2 07/16/2021 1402   CBG (last 3)  No results for input(s): GLUCAP in the last 72 hours.  Meds Scheduled Meds:  acetaminophen  1,000 mg Oral Q6H   Or   acetaminophen (TYLENOL) oral liquid 160 mg/5 mL  1,000 mg  Oral Q6H   albuterol  2.5 mg Nebulization TID   amLODipine  10 mg Oral Daily   aspirin EC  81 mg Oral Daily   atorvastatin  40 mg Oral Daily   bisacodyl  10 mg Oral Daily   clopidogrel  75 mg Oral Daily   enoxaparin (LOVENOX) injection  40 mg Subcutaneous Daily   hydrALAZINE  50 mg Oral Q8H   isosorbide mononitrate  30 mg Oral Daily   levothyroxine  112 mcg Oral Q0600   metoprolol tartrate  25 mg Oral BID   mometasone-formoterol  2 puff Inhalation BID   And   umeclidinium bromide  1 puff Inhalation Daily   senna-docusate  1 tablet Oral QHS   tamsulosin  0.4 mg Oral Daily   Continuous Infusions: PRN Meds:.fentaNYL (SUBLIMAZE) injection, ondansetron (ZOFRAN) IV, oxyCODONE, traMADol  Xrays DG CHEST PORT 1 VIEW  Result Date: 07/22/2021 CLINICAL DATA:  Post lobectomy EXAM: PORTABLE CHEST 1 VIEW COMPARISON:  07/21/2021 FINDINGS: Two left chest tubes remain present. Pneumothorax is not definitely identified on this study. Lung aeration is unchanged with volume loss in the left chest. Stable cardiomediastinal contours. Persistent subcutaneous emphysema in the left chest wall. IMPRESSION: No definite pneumothorax.  Stable lung aeration. Electronically Signed   By: Macy Mis M.D.   On: 07/22/2021 08:11    Assessment/Plan:  S/P Procedure(s) (LRB): XI ROBOTIC ASSISTED THORACOSCOPY-LEFT LOWER LOBECTOMY (Left) INTERCOSTAL NERVE BLOCK (Left) LYMPH NODE BIOPSY (Left)   POD#3 1 afeb, VSS S BP well controlled except one reading in 80's 2 sats ok on 3 liters- wean as able 3 CXR tiny apical space on left, some increased ASD in left base, chest tubes in place 4 creat trending down- 1.82 today, BUN increased to 30  5 CT 320 cc yesterday- may be able to remove big tube soon - no air leak, serous 6 leukocytosis resolved 7 H/H trending down , microcytic - will add Iron , not at transfusion threshold 8 if need to I/O cath again will leave foley and f/u with urology 9 passing some flatus now,  ileus improved 10 pulm status Improving slowly cont RX/hygiene/rehab- he walked more yesterday   LOS: 3 days    [name] PA-C [place] [phone] 07/23/2021

## 2021-07-24 ENCOUNTER — Inpatient Hospital Stay (HOSPITAL_COMMUNITY): Payer: Medicare Other

## 2021-07-24 MED ORDER — HEPARIN (PORCINE) IN NACL 1000-0.9 UT/500ML-% IV SOLN
INTRAVENOUS | Status: AC
  Filled 2021-07-24: qty 500

## 2021-07-24 MED ORDER — SORBITOL 70 % SOLN
15.0000 mL | Freq: Once | Status: AC
  Administered 2021-07-24: 15 mL via ORAL
  Filled 2021-07-24: qty 30

## 2021-07-24 MED ORDER — LIDOCAINE HCL (PF) 1 % IJ SOLN
INTRAMUSCULAR | Status: AC
  Filled 2021-07-24: qty 30

## 2021-07-24 NOTE — Progress Notes (Signed)
Mobility Specialist Progress Note    07/24/21 1413  Mobility  Activity Ambulated with assistance in hallway  Level of Assistance Standby assist, set-up cues, supervision of patient - no hands on  Assistive Device Front wheel walker  Distance Ambulated (ft) 420 ft  Activity Response Tolerated well  $Mobility charge 1 Mobility   Pre-Mobility: 92 HR, 90% on RA SpO2 During Mobility: 86% on RA SpO2 Post-Mobility: 116 HR, BP, >/=92% on 2LO2 SpO2  Pt received in bed and agreeable. Attempted to ambulate on RA but desatted to mid 80s. SpO2 >/= 92% with 2LO2. Pt did have some fluid leak during, RN aware. Returned to chair with call  bell in reach and RN present.   Hildred Alamin Mobility Specialist  Primary: 5N M.S. Phone: [phone] Secondary: 6N M.S. Phone: [phone]

## 2021-07-24 NOTE — Progress Notes (Signed)
SATURATION QUALIFICATIONS: (This note is used to comply with regulatory documentation for home oxygen)  Patient Saturations on Room Air at Rest = 90%  Patient Saturations on Room Air while Ambulating = 85%  Patient Saturations on 2 Liters of oxygen while Ambulating = 92%

## 2021-07-24 NOTE — Progress Notes (Addendum)
Patient chest tube and pigtail removed. White suture knotted before the level of the skin. Steri strips pulled tight over chest tube site. Gauze over both sites and large tagaderm applied. Medipore tape over entire site. Patient tolerated well. Lars Pinks PA informed, no new orders

## 2021-07-24 NOTE — Plan of Care (Signed)

## 2021-07-24 NOTE — TOC Initial Note (Signed)
Transition of Care [facility]) - Initial/Assessment Note    Patient Details  Name: [name] [name] MRN: [id number] Date of Birth: 02/05/1956  Transition of Care [facility]) CM/SW Contact:    Angelita Ingles, RN Phone [phone]  07/24/2021, 3:37 PM  Clinical Narrative:                 [name] consulted for patient with high risk for readmission and DME needs. CM at bedside. Patient states that he is from home with his daughter where he normally is able to function independently.Patient reports that his PCP is at [facility] where he is active and follows up frequently and has transportation to his appointments.  Patient states that he received medications through community health and wellness. Patient reports no DME or home health needs prior to admission. Patient offered choice for DME home O2. Patient has no preference. Home O2 referral submitted to [facility]. Portable tank to be delivered to bedside. There are currently no other needs. TOC will continue to follow  Expected Discharge Plan: Home/Self Care Barriers to Discharge: Continued Medical Work up   Patient Goals and CMS Choice Patient states their goals for this hospitalization and ongoing recovery are:: Wants to get netter to be able to go home   Choice offered to / list presented to : Patient  Expected Discharge Plan and Services Expected Discharge Plan: Home/Self Care In-house Referral: NA Discharge Planning Services: CM Consult Post Acute Care Choice: Durable Medical Equipment Living arrangements for the past 2 months: Single Family Home                 DME Arranged: Oxygen DME Agency: [employer] Date DME Agency Contacted: 07/24/21 Time DME Agency Contacted: 1536 Representative spoke with at DME Agency: Brenton Grills HH Arranged: NA [place] Agency: NA        Prior Living Arrangements/Services Living arrangements for the past 2 months: Single Family Home Lives with:: Adult Children Patient  language and need for interpreter reviewed:: Yes Do you feel safe going back to the place where you live?: Yes      Need for Family Participation in Patient Care: Yes (Comment) Care giver support system in place?: Yes (comment) Current home services:  (n/a) Criminal Activity/Legal Involvement Pertinent to Current Situation/Hospitalization: No - Comment as needed  Activities of Daily Living      Permission Sought/Granted Permission sought to share information with : Family Supports Permission granted to share information with : No              Emotional Assessment Appearance:: Appears stated age Attitude/Demeanor/Rapport: Gracious Affect (typically observed): Quiet, Pleasant Orientation: : Oriented to Self, Oriented to Place, Oriented to  Time, Oriented to Situation Alcohol / Substance Use: Not Applicable Psych Involvement: No (comment)  Admission diagnosis:  S/P lobectomy of lung [Z90.2] Patient Active Problem List   Diagnosis Date Noted   S/P lobectomy of lung 07/20/2021   Pulmonary nodule 04/28/2021   COPD (chronic obstructive pulmonary disease) ([place]) 04/28/2021   Status post surgery 03/02/2021   Ischemia of extremity 03/02/2021   Abdominal aortic aneurysm (AAA) without rupture ([place]) 12/12/2019   Drainage from right ear 08/01/2019   Right non-suppurative otitis media 02/01/2019   Skin rash 02/01/2019   Itching 02/01/2019   Essential hypertension 09/08/2018   Grieving 09/08/2018   Shortness of breath 09/08/2018   Cough 09/08/2018   Tobacco use 09/08/2018   Vitamin D deficiency 09/08/2018   Renal calculi 02/11/2017  Abnormal nuclear stress test    Nephrolithiasis 11/14/2016   Hypothyroidism 06/23/2015   Noncompliance with follow up and medications 06/23/2015   Acute kidney injury superimposed on chronic kidney disease ([place]) 06/22/2015   Left nephrolithiasis 06/22/2015   Hydronephrosis, left 06/22/2015   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 [place] [address] Phone: [phone] Fax: [phone]  [facility] [phone] [place], [place] - [address] [place] [address] [place] [address] Phone: [phone] Fax: [phone]     Social Determinants of Health (SDOH) Interventions    Readmission Risk Interventions    07/24/2021    3:31 PM  Readmission Risk Prevention Plan  Transportation Screening Complete  PCP or Specialist Appt within 3-5 Days Complete  HRI or Home Care Consult Complete  Social Work Consult for [place] Planning/Counseling Complete  Palliative Care Screening Not Applicable  Medication Review [employer]) Referral to Pharmacy

## 2021-07-24 NOTE — Plan of Care (Signed)
  Problem: Education: Goal: Knowledge of General Education information will improve Description: Including pain rating scale, medication(s)/side effects and non-pharmacologic comfort measures Outcome: Progressing   Problem: Health Behavior/Discharge Planning: Goal: Ability to manage health-related needs will improve Outcome: Progressing   Problem: Clinical Measurements: Goal: Ability to maintain clinical measurements within normal limits will improve Outcome: Progressing Goal: Will remain free from infection Outcome: Progressing Goal: Diagnostic test results will improve Outcome: Progressing Goal: Respiratory complications will improve Outcome: Progressing   Problem: Activity: Goal: Risk for activity intolerance will decrease Outcome: Progressing   Problem: Nutrition: Goal: Adequate nutrition will be maintained Outcome: Progressing   Problem: Coping: Goal: Level of anxiety will decrease Outcome: Progressing   Problem: Pain Managment: Goal: General experience of comfort will improve Outcome: Progressing   Problem: Safety: Goal: Ability to remain free from injury will improve Outcome: Progressing   Problem: Skin Integrity: Goal: Risk for impaired skin integrity will decrease Outcome: Progressing

## 2021-07-24 NOTE — Progress Notes (Addendum)
      [address]       [place] [address]             [phone]       4 Days Post-Op Procedure(s) (LRB): XI ROBOTIC ASSISTED THORACOSCOPY-LEFT LOWER LOBECTOMY (Left) INTERCOSTAL NERVE BLOCK (Left) LYMPH NODE BIOPSY (Left)  Subjective: Patient's appetite since surgery "so so" mostly because he does not like the food. No bowel movement yet.  Objective: Vital signs in last 24 hours: Temp:  [97.7 F (36.5 C)-98.1 F (36.7 C)] 98 F (36.7 C) (05/26 0453) Pulse Rate:  [69-184] 74 (05/26 0722) Cardiac Rhythm: Normal sinus rhythm (05/26 0454) Resp:  [16-20] 20 (05/26 0722) BP: (89-131)/(56-81) 129/77 (05/26 0639) SpO2:  [90 %-100 %] 94 % (05/26 0725) FiO2 (%):  [28 %] 28 % (05/26 0725)      Intake/Output from previous day: 05/25 0701 - 05/26 0700 In: -  Out: 1660 [Urine:1450; Chest Tube:210]   Physical Exam:  Cardiovascular: RRR Pulmonary: Coarse breath sounds bilaterally Abdomen: Soft, non tender, bowel sounds present. Extremities: noower extremity edema. Wounds: Clean and dry.  No erythema or signs of infection. Chest Tubes: to water seal, no air leak  Lab Results: CBC: Recent Labs    07/22/21 0104 07/23/21 0116  WBC 14.6* 10.7*  HGB 8.9* 7.9*  HCT 30.7* 28.2*  PLT 223 218   BMET:  Recent Labs    07/22/21 0104 07/23/21 0116  NA 138 139  K 4.6 3.8  CL 109 110  CO2 20* 21*  GLUCOSE 121* 99  BUN 24* 30*  CREATININE 1.94* 1.82*  CALCIUM 8.6* 8.3*    PT/INR: No results for input(s): LABPROT, INR in the last 72 hours. ABG:  INR: Will add last result for INR, ABG once components are confirmed Will add last 4 CBG results once components are confirmed  Assessment/Plan:  1. CV - SR. On Hydralazine 50 mg tid, Imdur 30 mg daily, Amlodipine 10 mg daily, and Lopressor 25 mg bid. SBP somewhat labile this am so parameters placed on above medications. 2.  Pulmonary - On 2 liters of oxygen via . Wean as able. Chest tube with 210 cc last  12 hours. CXR this am not ordered so will order. As discussed with Dr. Kipp Brood, will remove chest tubes. Will arrange for home oxygen. Continue Albuterol and Dulera. Encourage incentive spirometer 3. Urinary retention-on Flomax. Catheter re inserted yesterday. Will need follow up with urology as will go home with foley. 4. Expected post op blood loss anemia-Last H and H stable at 7.9 and 28.2. Continue Trinsicon 5. GI-ileus improving. Will give laxative as no bowel movement yet. 6. History of hypothyroidism-continue Levothyroxine 112 mcg daily 7. On Lovenox for DVT prophylaxis 8. Possible discharge in am  [facility] ZimmermanPA-C 07/24/2021,7:39 AM [phone]

## 2021-07-25 ENCOUNTER — Inpatient Hospital Stay (HOSPITAL_COMMUNITY): Payer: Medicare Other

## 2021-07-25 NOTE — Progress Notes (Addendum)
      [address]       [place] [address]             [phone]      5 Days Post-Op Procedure(s) (LRB): XI ROBOTIC ASSISTED THORACOSCOPY-LEFT LOWER LOBECTOMY (Left) INTERCOSTAL NERVE BLOCK (Left) LYMPH NODE BIOPSY (Left)  Subjective:  Patient doing all right.  He has been able to be weaned from oxygen, he denies shortness of breath.    Objective: Vital signs in last 24 hours: Temp:  [97.6 F (36.4 C)-98.1 F (36.7 C)] 97.6 F (36.4 C) (05/27 0412) Pulse Rate:  [76-96] 76 (05/27 0412) Cardiac Rhythm: Sinus bradycardia;Bundle branch block (05/27 0700) Resp:  [16-20] 16 (05/27 0412) BP: (108-129)/(50-80) 129/80 (05/27 0412) SpO2:  [91 %-98 %] 95 % (05/27 0412)  Intake/Output from previous day: 05/26 0701 - 05/27 0700 In: 800 [P.O.:800] Out: 1250 [Urine:1250]  General appearance: alert, cooperative, and no distress Heart: regular rate and rhythm Lungs: diminished breath sounds upper left and coarse throughout Abdomen: soft, non-tender; bowel sounds normal; no masses,  no organomegaly Extremities: extremities normal, atraumatic, no cyanosis or edema Wound: clean and dry  Lab Results: Recent Labs    07/23/21 0116  WBC 10.7*  HGB 7.9*  HCT 28.2*  PLT 218   BMET:  Recent Labs    07/23/21 0116  NA 139  K 3.8  CL 110  CO2 21*  GLUCOSE 99  BUN 30*  CREATININE 1.82*  CALCIUM 8.3*    PT/INR: No results for input(s): LABPROT, INR in the last 72 hours. ABG    Component Value Date/Time   PHART 7.4 07/16/2021 1402   HCO3 19.2 (L) 07/16/2021 1402   TCO2 17 (L) 03/02/2021 1811   ACIDBASEDEF 4.5 (H) 07/16/2021 1402   O2SAT 97.2 07/16/2021 1402   CBG (last 3)  No results for input(s): GLUCAP in the last 72 hours.  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   LOS: 5 days    Ellwood Handler, PA-C 07/25/2021   Agree with above pneumoT stable Will go home with leg bag Home soon  [employer]

## 2021-07-25 NOTE — Progress Notes (Signed)
Mobility Specialist: Progress Note   07/25/21 1558  Mobility  Activity Ambulated with assistance in hallway  Level of Assistance Contact guard assist, steadying assist  Assistive Device Front wheel walker  Distance Ambulated (ft) 400 ft  Activity Response Tolerated well  $Mobility charge 1 Mobility   Pre-Mobility: 71 HR, 95% SpO2 Post-Mobility: 75 HR, 100% SpO2  Pt received in the bed, easily awakened and agreeable to mobility. Ambulated on 2 L/min [place]. No c/o throughout session. Pt sitting EOB after ambulation with call bell and phone at his side.   [facility] Masyn Fullam Mobility Specialist Mobility Specialist 5 North: [phone] Mobility Specialist 6 North: [phone]

## 2021-07-26 ENCOUNTER — Inpatient Hospital Stay (HOSPITAL_COMMUNITY): Payer: Medicare Other

## 2021-07-26 NOTE — Progress Notes (Addendum)
      [address]       [place] [address]             [phone]      6 Days Post-Op Procedure(s) (LRB): XI ROBOTIC ASSISTED THORACOSCOPY-LEFT LOWER LOBECTOMY (Left) INTERCOSTAL NERVE BLOCK (Left) LYMPH NODE BIOPSY (Left)  Subjective:  No complaints.  Patient states he is doing alright.  He is not coughing up as much sputum as before.  He ambulated in the hallway yesterday w/o difficulty.  Objective: Vital signs in last 24 hours: Temp:  [97.8 F (36.6 C)-98.2 F (36.8 C)] 98.2 F (36.8 C) (05/28 0700) Pulse Rate:  [60-82] 63 (05/28 0700) Cardiac Rhythm: Normal sinus rhythm;Bundle branch block (05/28 0700) Resp:  [17-22] 19 (05/28 0700) BP: (85-117)/(52-74) 103/71 (05/28 0700) SpO2:  [79 %-100 %] 100 % (05/28 0753)  Intake/Output from previous day: 05/27 0701 - 05/28 0700 In: 720 [P.O.:720] Out: 1250 [Urine:1250]  General appearance: alert, cooperative, and no distress Heart: regular rate and rhythm Lungs: coarse throughout Abdomen: soft, non-tender; bowel sounds normal; no masses,  no organomegaly Extremities: extremities normal, atraumatic, no cyanosis or edema Wound: clean and dry, sub q emphysema along left chest  Lab Results: No results for input(s): WBC, HGB, HCT, PLT in the last 72 hours. BMET: No results for input(s): NA, K, CL, CO2, GLUCOSE, BUN, CREATININE, CALCIUM in the last 72 hours.  PT/INR: No results for input(s): LABPROT, INR in the last 72 hours. ABG    Component Value Date/Time   PHART 7.4 07/16/2021 1402   HCO3 19.2 (L) 07/16/2021 1402   TCO2 17 (L) 03/02/2021 1811   ACIDBASEDEF 4.5 (H) 07/16/2021 1402   O2SAT 97.2 07/16/2021 1402   CBG (last 3)  No results for input(s): GLUCAP in the last 72 hours.  Assessment/Plan: S/P Procedure(s) (LRB): XI ROBOTIC ASSISTED THORACOSCOPY-LEFT LOWER LOBECTOMY (Left) INTERCOSTAL NERVE BLOCK (Left) LYMPH NODE BIOPSY (Left)  CV- NSR, H/O HTN- continue Hydralazine, Imdur, Norvasc, and  Lopressor Pulm- off oxygen, CXR with tiny left pneumothorax which remains stable, mild sub q emphysema along left side Urinary retention- foley catheter in place.. this is the patient's barrier to discharge, we need to arrange outpatient Urology follow up.  His daughter is working on this. My attempts to contact her have been unsuccessful GI- Ileus resolving Hypothyroidism- continue synthroid Dispo- patient stable, pneumothorax is small and remains unchanged, remains off oxygen, foley catheter follow up is barrier to discharge, per patient his daughter is working on this, attempts to contact her have been unsuccessful, home once this can be finalized as he is to be discharged with foley in place   LOS: 6 days    Ellwood Handler, PA-C 07/26/2021   Agree with above. Ready for discharge but pending follow-up with urology. Plan to remove Foley today for voiding trial.  [name]

## 2021-07-27 LAB — CBC
HCT: 32 % — ABNORMAL LOW (ref 39.0–52.0)
Hemoglobin: 9.5 g/dL — ABNORMAL LOW (ref 13.0–17.0)
MCH: 20.6 pg — ABNORMAL LOW (ref 26.0–34.0)
MCHC: 29.7 g/dL — ABNORMAL LOW (ref 30.0–36.0)
MCV: 69.4 fL — ABNORMAL LOW (ref 80.0–100.0)
Platelets: 261 10*3/uL (ref 150–400)
RBC: 4.61 MIL/uL (ref 4.22–5.81)
RDW: 20.8 % — ABNORMAL HIGH (ref 11.5–15.5)
WBC: 9.2 10*3/uL (ref 4.0–10.5)
nRBC: 0 % (ref 0.0–0.2)

## 2021-07-27 LAB — BASIC METABOLIC PANEL
Anion gap: 5 (ref 5–15)
BUN: 20 mg/dL (ref 8–23)
CO2: 22 mmol/L (ref 22–32)
Calcium: 8.5 mg/dL — ABNORMAL LOW (ref 8.9–10.3)
Chloride: 111 mmol/L (ref 98–111)
Creatinine, Ser: 1.5 mg/dL — ABNORMAL HIGH (ref 0.61–1.24)
GFR, Estimated: 51 mL/min — ABNORMAL LOW (ref >60)
Glucose, Bld: 123 mg/dL — ABNORMAL HIGH (ref 70–99)
Potassium: 3.7 mmol/L (ref 3.5–5.1)
Sodium: 138 mmol/L (ref 135–145)

## 2021-07-27 MED ORDER — DM-GUAIFENESIN ER 30-600 MG PO TB12: 1.0000 | ORAL_TABLET | Freq: Two times a day (BID) | ORAL | Status: DC | PRN

## 2021-07-27 MED ORDER — ALBUTEROL SULFATE (2.5 MG/3ML) 0.083% IN NEBU: 2.5000 mg | INHALATION_SOLUTION | RESPIRATORY_TRACT | Status: DC | PRN

## 2021-07-27 NOTE — Progress Notes (Addendum)
      [address]       [place] [address]             [phone]      7 Days Post-Op Procedure(s) (LRB): XI ROBOTIC ASSISTED THORACOSCOPY-LEFT LOWER LOBECTOMY (Left) INTERCOSTAL NERVE BLOCK (Left) LYMPH NODE BIOPSY (Left)  Subjective:  Patient not feeling well today.  He is having increased pain along his left pec region.  He has not been able to void since foley has been removed.. he has required I/O cath once with removal of 600 cc urine.    Objective: Vital signs in last 24 hours: Temp:  [97.6 F (36.4 C)-98.4 F (36.9 C)] 97.6 F (36.4 C) (05/29 0755) Pulse Rate:  [53-69] 62 (05/29 0902) Cardiac Rhythm: Sinus bradycardia (05/29 0709) Resp:  [17-25] 20 (05/29 0300) BP: (98-134)/(45-72) 107/67 (05/29 0902) SpO2:  [92 %-99 %] 95 % (05/29 0827) Weight:  [70.4 kg] 70.4 kg (05/29 0300)  Intake/Output from previous day: 05/28 0701 - 05/29 0700 In: 300 [P.O.:300] Out: 600 [Urine:600]  General appearance: alert, cooperative, and no distress Heart: regular rate and rhythm Lungs: coarse throughout Abdomen: soft, non-tender; bowel sounds normal; no masses,  no organomegaly Extremities: extremities normal, atraumatic, no cyanosis or edema Wound: clean and dry, mild sub q emphysema left chest  Lab Results: No results for input(s): WBC, HGB, HCT, PLT in the last 72 hours. BMET: No results for input(s): NA, K, CL, CO2, GLUCOSE, BUN, CREATININE, CALCIUM in the last 72 hours.  PT/INR: No results for input(s): LABPROT, INR in the last 72 hours. ABG    Component Value Date/Time   PHART 7.4 07/16/2021 1402   HCO3 19.2 (L) 07/16/2021 1402   TCO2 17 (L) 03/02/2021 1811   ACIDBASEDEF 4.5 (H) 07/16/2021 1402   O2SAT 97.2 07/16/2021 1402   CBG (last 3)  No results for input(s): GLUCAP in the last 72 hours.  Assessment/Plan: S/P Procedure(s) (LRB): XI ROBOTIC ASSISTED THORACOSCOPY-LEFT LOWER LOBECTOMY (Left) INTERCOSTAL NERVE BLOCK (Left) LYMPH NODE BIOPSY  (Left)  CV- NSR, H/O HTN- on Norvasc, Hydralazine, Imdur, Lopressor Pulm- oxygen sats are in the 90s, mild extension of sub q emphysema along left chest, breath sounds remain course, continue inhalers, add Mucinex Renal- creatinine was elevated on 5/25, has been checked since that time, will get BMET, CBC Urinary Retention- voiding trial again attempted yesterday with removal of foley, patient has been unable to void.. I/O cath already performed... flomax is ordered, will likely require foley to be replaced today.. daughter is going to contact urology office tomorrow to get follow up worked out [employer]- patient not feeling great today, labs ordered, maintaining good oxygen sats on RA, likely will  need to replace foley as he is unable to void, repeat CXR in AM or sooner if needed   LOS: 7 days    Ellwood Handler, PA-C 07/27/2021   Agree with above Urinary retention  [place]

## 2021-07-27 NOTE — Progress Notes (Addendum)
Patient has been unable to void post removal of foley catheter. Bladder scan on patient revealed approximately 335mls of urine in bladder. Repeat bladder scan revealed 537mls urine in bladder. Per post foley protocol, In and out cath times one order placed and implemented. Total of 631mls urine removed from bladder. Urine was blood tinged with no foul odor noted. Patient instructed to notify RN if feeling the urge to urinate. Patient tolerated procedure well and is resting in bed with call light in reach.

## 2021-07-27 NOTE — Plan of Care (Signed)
  Problem: Education: Goal: Knowledge of General Education information will improve Description: Including pain rating scale, medication(s)/side effects and non-pharmacologic comfort measures Outcome: Progressing   Problem: Activity: Goal: Risk for activity intolerance will decrease Outcome: Progressing   Problem: Nutrition: Goal: Adequate nutrition will be maintained Outcome: Progressing   Problem: Coping: Goal: Level of anxiety will decrease Outcome: Progressing   Problem: Elimination: Goal: Will not experience complications related to bowel motility Outcome: Progressing Goal: Will not experience complications related to urinary retention Outcome: Not Progressing   Problem: Pain Managment: Goal: General experience of comfort will improve Outcome: Progressing

## 2021-07-27 NOTE — Progress Notes (Signed)
Mobility Specialist Progress Note    07/27/21 1231  Mobility  Activity Ambulated with assistance in hallway  Level of Assistance Contact guard assist, steadying assist  Assistive Device Other (Comment) (HHA)  Distance Ambulated (ft) 420 ft  Activity Response Tolerated well  $Mobility charge 1 Mobility   Pre-Mobility: 69 HR, 95% SpO2 During Mobility: 85 HR, 96% SpO2 Post-Mobility: 62 HR, 97% SpO2  Pt received sitting EOB and agreeable. No complaints on walk. Returned to chair with call bell in reach.    Hildred Alamin Mobility Specialist  Primary: 5N M.S. Phone: [phone] Secondary: 6N M.S. Phone: [phone]

## 2021-07-28 ENCOUNTER — Inpatient Hospital Stay (HOSPITAL_COMMUNITY): Payer: Medicare Other

## 2021-07-28 MED ORDER — AMLODIPINE BESYLATE 5 MG PO TABS: 5.0000 mg | ORAL_TABLET | Freq: Every day | ORAL | Status: DC

## 2021-07-28 MED ORDER — OXYCODONE HCL 5 MG PO TABS
5.0000 mg | ORAL_TABLET | Freq: Four times a day (QID) | ORAL | 0 refills | Status: AC | PRN
Start: 2021-07-28 — End: ?

## 2021-07-28 MED ORDER — AMLODIPINE BESYLATE 5 MG PO TABS: 5.0000 mg | ORAL_TABLET | Freq: Every day | ORAL | 1 refills | Status: DC

## 2021-07-28 MED ORDER — FE FUMARATE-B12-VIT C-FA-IFC PO CAPS: 1.0000 | ORAL_CAPSULE | Freq: Two times a day (BID) | ORAL | 1 refills | Status: DC

## 2021-07-28 NOTE — Progress Notes (Addendum)
[address]       [place] [address]             [phone]      8 Days Post-Op Procedure(s) (LRB): XI ROBOTIC ASSISTED THORACOSCOPY-LEFT LOWER LOBECTOMY (Left) INTERCOSTAL NERVE BLOCK (Left) LYMPH NODE BIOPSY (Left) Subjective: Feeling better, pulm status is stable and he is not SOB  Objective: Vital signs in last 24 hours: Temp:  [97.6 F (36.4 C)-98.2 F (36.8 C)] 97.8 F (36.6 C) (05/30 0432) Pulse Rate:  [55-71] 60 (05/30 0432) Cardiac Rhythm: Sinus bradycardia (05/29 1904) Resp:  [19-20] 19 (05/30 0432) BP: (91-134)/(54-72) 115/60 (05/30 0538) SpO2:  [92 %-96 %] 95 % (05/30 0432)  Hemodynamic parameters for last 24 hours:    Intake/Output from previous day: 05/29 0701 - 05/30 0700 In: -  Out: 825 [Urine:825] Intake/Output this shift: No intake/output data recorded.  General appearance: alert, cooperative, and no distress Heart: regular rate and rhythm Lungs: clear to auscultation bilaterally and + sub q air on left chest Abdomen: benign Extremities: no edema or calf tenderness Wound: incis healing well  Lab Results: Recent Labs    07/27/21 1003  WBC 9.2  HGB 9.5*  HCT 32.0*  PLT 261   BMET:  Recent Labs    07/27/21 1003  NA 138  K 3.7  CL 111  CO2 22  GLUCOSE 123*  BUN 20  CREATININE 1.50*  CALCIUM 8.5*    PT/INR: No results for input(s): LABPROT, INR in the last 72 hours. ABG    Component Value Date/Time   PHART 7.4 07/16/2021 1402   HCO3 19.2 (L) 07/16/2021 1402   TCO2 17 (L) 03/02/2021 1811   ACIDBASEDEF 4.5 (H) 07/16/2021 1402   O2SAT 97.2 07/16/2021 1402   CBG (last 3)  No results for input(s): GLUCAP in the last 72 hours.  Meds Scheduled Meds:  amLODipine  10 mg Oral Daily   aspirin EC  81 mg Oral Daily   atorvastatin  40 mg Oral Daily   bisacodyl  10 mg Oral Daily   Chlorhexidine Gluconate Cloth  6 each Topical Daily   clopidogrel  75 mg Oral Daily   enoxaparin (LOVENOX) injection  40 mg  Subcutaneous Daily   ferrous [id number]-CHYIFOY C-folic acid  1 capsule Oral BID PC   hydrALAZINE  50 mg Oral Q8H   isosorbide mononitrate  30 mg Oral Daily   levothyroxine  112 mcg Oral Q0600   metoprolol tartrate  25 mg Oral BID   mometasone-formoterol  2 puff Inhalation BID   And   umeclidinium bromide  1 puff Inhalation Daily   senna-docusate  1 tablet Oral QHS   tamsulosin  0.4 mg Oral Daily   Continuous Infusions: PRN Meds:.albuterol, dextromethorphan-guaiFENesin, ondansetron (ZOFRAN) IV, oxyCODONE, traMADol  Xrays No results found.  Assessment/Plan: S/P Procedure(s) (LRB): XI ROBOTIC ASSISTED THORACOSCOPY-LEFT LOWER LOBECTOMY (Left) INTERCOSTAL NERVE BLOCK (Left) LYMPH NODE BIOPSY (Left) POD#8  1 afeb, s BP 90's-130's, sinus rhythm/Sinus brady 2 sas ok on RA 3 no new labs 4 CXR small left apical pntx, around 5% 5 adeq UOP- foley in place, family working on urology f/u, may need to find other practices if can't be resolved soon      LOS: 8 days    [name] PA-C Pager [phone] 07/28/2021    Agree with above Pt had been ready for discharge for several days, but having difficulty arranging f/u with urology  [name]

## 2021-07-28 NOTE — TOC Progression Note (Signed)
Transition of Care [facility]) - Progression Note    Patient Details  Name: [name] [name] MRN: [id number] Date of Birth: 12-11-1955  [facility]) CM/SW [place], RN Phone Number:[phone]  07/28/2021, 3:47 PM  Clinical Narrative:    CM received message to inquire if CM is able to assist patients daughter in setting up appointment with a urologist. Cm unable to make a referral. Cm called patients daughter Deloss Amico to get a better understanding of what the daughter is in need of. CM spoke with daughter Shirlean Mylar who states that she has been calling two different urologist and one can see patient in June and the other can not see him before August. Per Robin the office is requesting CT scan results and a referral. Shirlean Mylar states that Jadene Pierini has been assisting her and has reached out to [facility]. TOC unable to make referral. Daughter states that she will follow up with Jadene Pierini for urology referral.    Expected Discharge Plan: Home/Self Care Barriers to Discharge: Continued Medical Work up  Expected Discharge Plan and Services Expected Discharge Plan: Home/Self Care In-house Referral: NA Discharge Planning Services: CM Consult Post Acute Care Choice: Durable Medical Equipment Living arrangements for the past 2 months: Single Family Home                 DME Arranged: Oxygen DME Agency: [employer] Date DME Agency Contacted: 07/24/21 Time DME Agency Contacted: 1536 Representative spoke with at DME Agency: Brenton Grills HH Arranged: NA [place] Agency: NA         Social Determinants of Health ([place]) Interventions    Readmission Risk Interventions    07/24/2021    3:31 PM  Readmission Risk Prevention Plan  Transportation Screening Complete  PCP or Specialist Appt within 3-5 Days Complete  HRI or Home Care Consult Complete  Social Work Consult for [place] Planning/Counseling Complete  Palliative Care Screening Not Applicable   Medication Review Press photographer) Referral to Pharmacy

## 2021-07-28 NOTE — Progress Notes (Signed)
NT removed PIVs. RN went over d/c summary w/ pt and family. Belongings w/ pt. NT transporting pt to private vehicle where pt's family will transport pt home

## 2021-07-29 NOTE — Progress Notes (Unsigned)
      [address]       [name] [address]             [phone]        [name] [name] Medical Record #[id number] Date of Birth: April 07, 1955  Referring: [name] Francois, NP Primary Care: [name] Francois, NP Primary Cardiologist:[name] Claiborne Billings, MD  Reason for visit:   follow-up  History of Present Illness:     ***  Physical Exam: There were no vitals taken for this visit.  Alert NAD Incision ***.  Sternum *** Abdomen, ND *** peripheral edema   Diagnostic Studies & Laboratory data: CXR: *** Path: SURGICAL PATHOLOGY  CASE: [id number]  PATIENT: [name] [name]  Surgical Pathology Report      Clinical History: pulmonary nodule (cm)      FINAL MICROSCOPIC DIAGNOSIS:   A. 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  Please see the synoptic report after specimen L.   LUNG: Resection   Synchronous Tumors: No.  Total Number of Primary Tumors: One (1)  Note: The smaller satellite nodule grossly described is nonneoplastic  and is a nodular area of organizing pneumonia.  Procedure: Lobectomy.  Specimen Laterality: Left.  Tumor Focality: Unifocal.  Tumor Site: Lower lobe.  Tumor Size:       Total Tumor Size: 1.9 cm. X 1.9 cm. X 1.5 cm.  Histologic Type: Large cell neuroendocrine carcinoma.  Visceral Pleura Invasion: Not identified.  Direct Invasion of Adjacent Structures: No adjacent structures present.  Lymphovascular Invasion: Not identified.  Margins: All margins are negative for invasive carcinoma.       Closest Margin(s) to Invasive Carcinoma: Pleura.  Treatment Effect: No known presurgical therapy.  Regional Lymph Nodes:       Number of Lymph Nodes Involved: None (0).       Number of Lymph Nodes Examined: Eleven (11)                       Nodal Sites Examined: Hilar and level 4 thru 9.  Distant Metastasis:       Distant Site(s) Involved: None known.  Pathologic  Stage Classification (pTNM, AJCC 8th Edition): pT1b, pN0     Assessment / Plan:   66yo male s/p LLLectomy for T1bN0M0 neuroendocrine tumor Stage 1a.   [name] 07/29/2021 8:27 AM

## 2021-07-30 ENCOUNTER — Other Ambulatory Visit: Payer: Self-pay | Admitting: Thoracic Surgery (Cardiothoracic Vascular Surgery)

## 2021-07-30 ENCOUNTER — Other Ambulatory Visit: Payer: Self-pay | Admitting: *Deleted

## 2021-07-30 DIAGNOSIS — Z902 Acquired absence of lung [part of]: Secondary | ICD-10-CM

## 2021-07-30 NOTE — Progress Notes (Signed)
The proposed treatment discussed in cancer conference is for discussion purpose only and is not a binding recommendation. The patient was not physically examined nor present for their treatment options. Therefore, final treatment plans cannot be decided.  ?

## 2021-07-31 ENCOUNTER — Telehealth: Payer: Self-pay | Admitting: Internal Medicine

## 2021-07-31 ENCOUNTER — Ambulatory Visit (INDEPENDENT_AMBULATORY_CARE_PROVIDER_SITE_OTHER): Payer: Self-pay | Admitting: Thoracic Surgery (Cardiothoracic Vascular Surgery)

## 2021-07-31 ENCOUNTER — Other Ambulatory Visit (HOSPITAL_COMMUNITY): Payer: Self-pay

## 2021-07-31 ENCOUNTER — Ambulatory Visit
Admission: RE | Admit: 2021-07-31 | Discharge: 2021-07-31 | Disposition: A | Discharge status: Home / Self Care | Payer: Medicare Other | Source: Ambulatory Visit | Attending: Thoracic Surgery (Cardiothoracic Vascular Surgery) | Admitting: Thoracic Surgery (Cardiothoracic Vascular Surgery)

## 2021-07-31 ENCOUNTER — Encounter: Payer: Self-pay | Admitting: *Deleted

## 2021-07-31 VITALS — BP 85/49 | HR 63 | Resp 20 | Ht 69.0 in | Wt 164.0 lb

## 2021-07-31 DIAGNOSIS — Z902 Acquired absence of lung [part of]: Secondary | ICD-10-CM

## 2021-07-31 DIAGNOSIS — R0602 Shortness of breath: Secondary | ICD-10-CM

## 2021-07-31 MED ORDER — ALBUTEROL SULFATE HFA 108 (90 BASE) MCG/ACT IN AERS
INHALATION_SPRAY | RESPIRATORY_TRACT | 11 refills | Status: DC
  Filled 2021-07-31: qty 8.5, 15d supply, fill #0
  Filled 2021-08-03: qty 8.5, fill #0
  Filled 2021-08-04: qty 8.5, 16d supply, fill #0
  Filled 2021-10-21: qty 8.5, 16d supply, fill #1
  Filled 2021-12-15: qty 6.7, 17d supply, fill #2
  Filled 2021-12-30: qty 6.7, 16d supply, fill #3
  Filled 2022-02-15: qty 8.5, 17d supply, fill #4
  Filled 2022-02-26: qty 8.5, 17d supply, fill #0
  Filled 2022-02-28: qty 6.7, 25d supply, fill #0
  Filled 2022-03-08: qty 6.7, 20d supply, fill #0
  Filled 2022-03-25: qty 6.7, 25d supply, fill #1
  Filled 2022-05-24: qty 8.5, 17d supply, fill #2
  Filled 2022-06-08: qty 6.7, 17d supply, fill #3
  Filled 2022-06-21: qty 8.5, 17d supply, fill #4

## 2021-07-31 MED ORDER — BUDESONIDE-FORMOTEROL FUMARATE 80-4.5 MCG/ACT IN AERO
2.0000 | INHALATION_SPRAY | Freq: Two times a day (BID) | RESPIRATORY_TRACT | 11 refills | Status: DC
  Filled 2021-07-31 – 2021-08-04 (×3): qty 10.2, 30d supply, fill #0
  Filled 2021-10-21: qty 10.2, 30d supply, fill #1
  Filled 2021-12-15: qty 10.2, 30d supply, fill #2
  Filled 2022-02-15: qty 10.2, 30d supply, fill #3
  Filled 2022-03-16: qty 10.2, 30d supply, fill #4
  Filled 2022-05-24: qty 10.2, 30d supply, fill #5
  Filled 2022-06-21: qty 10.2, 30d supply, fill #6

## 2021-07-31 MED ORDER — BREZTRI AEROSPHERE 160-9-4.8 MCG/ACT IN AERO: 2.0000 | INHALATION_SPRAY | Freq: Two times a day (BID) | RESPIRATORY_TRACT | 0 refills | Status: DC

## 2021-07-31 NOTE — Progress Notes (Signed)
Oncology Nurse Navigator Documentation     07/31/2021   10:00 AM  Oncology Nurse Navigator Flowsheets  Navigator Follow Up Date: 08/11/2021  Navigator Follow Up Reason: New Patient Appointment  Navigator Location [facility]  Referral Date to RadOnc/MedOnc 07/31/2021  Treatment Phase Other  Barriers/Navigation Needs Coordination of Care/I received referral on [name] [name] today. I notified new patient coordinator to call and schedule him to be seen on 6/13 with labs.   Interventions Coordination of Care  Acuity Level 2-Minimal Needs (1-2 Barriers Identified)  Coordination of Care Other  Time Spent with Patient 15

## 2021-07-31 NOTE — Telephone Encounter (Signed)
Scheduled appt per 6/2 referral. Pt is aware of appt date and time. Pt is aware to arrive 15 mins prior to appt time and to bring and updated insurance card. Pt is aware of appt location.   

## 2021-08-04 ENCOUNTER — Other Ambulatory Visit: Payer: Self-pay

## 2021-08-04 ENCOUNTER — Encounter: Payer: Self-pay | Admitting: Emergency Medicine

## 2021-08-04 ENCOUNTER — Ambulatory Visit (INDEPENDENT_AMBULATORY_CARE_PROVIDER_SITE_OTHER): Payer: Medicare Other | Admitting: Emergency Medicine

## 2021-08-04 ENCOUNTER — Other Ambulatory Visit (HOSPITAL_COMMUNITY): Payer: Self-pay

## 2021-08-04 DIAGNOSIS — C7A8 Other malignant neuroendocrine tumors: Secondary | ICD-10-CM

## 2021-08-04 DIAGNOSIS — J449 Chronic obstructive pulmonary disease, unspecified: Secondary | ICD-10-CM | POA: Diagnosis not present

## 2021-08-04 MED ORDER — BREZTRI AEROSPHERE 160-9-4.8 MCG/ACT IN AERO: 2.0000 | INHALATION_SPRAY | Freq: Two times a day (BID) | RESPIRATORY_TRACT | 0 refills | Status: DC

## 2021-08-04 NOTE — Addendum Note (Signed)
Addended by: Gavin Potters R on: 08/04/2021 05:08 PM   Modules accepted: Orders

## 2021-08-04 NOTE — Patient Instructions (Signed)
Follow-up with Dr. Kipp Brood and with Oncology as planned We will restart Breztri 2 puffs twice a day.  Rinse and gargle after using.  We will send a prescription for this to your pharmacy. We will give you a prescription for albuterol.  You can use 2 puffs up to every 4 hours if needed for shortness of breath, chest tightness, wheezing. Follow with Dr Lamonte Sakai in 6 months or sooner if you have any problems

## 2021-08-04 NOTE — Assessment & Plan Note (Signed)
Underwent navigational bronchoscopy and then robotic lobectomy large cell neuroendocrine tumor.  No positive margins.  He is supposed to follow-up with oncology to discuss any adjuvant therapy that would be indicated.  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  I encouraged him to go to his oncology appointment and hear the options before making a decision about whether to undergo any other therapy.

## 2021-08-04 NOTE — Progress Notes (Signed)
Subjective:    Patient ID: [name] [name], male    DOB: [birth year], 66 y.o.   MRN: [id number]  HPI  ROV 06/03/21 --follow-up visit 66 year old gentleman with history of tobacco use, throat cancer, hypertension.  He was found to have a left superior segmental lobulated pulmonary nodule on chest imaging, underwent PET scan 04/10/2021 that showed the nodule is markedly hypermetabolic.  We discussed possible navigational bronchoscopy versus referral for primary resection.  He also has some concerns about possible additional radiation, chemotherapy.  He wanted to see his PFT and consider before we decide to proceed with any intervention. He is out of his Symbicort, may need a refill from [place]. Uses albuterol about   Pulmonary function testing performed 05/15/2021 reviewed by me show moderate obstruction without a bronchodilator response, hyperinflated lung volumes and a decreased diffusion capacity.   ROV 08/04/21 --follow-up visit 66 year old man with a history of throat cancer, hypertension, tobacco use.  He has moderately severe obstruction on pulmonary function testing from 04/2021.  He underwent navigational bronchoscopy and then resection of a left lower lobe superior segment pulmonary nodule that was found to be large cell neuroendocrine tumor. He feels that he benefited from [place], but he is out of it right now. He feels week, is having exertional SOB. Occasional cough. He has pleuritic pain w cough on the L.     Review of Systems As per HPI  Past Medical History:  Diagnosis Date   Arthritis    Chronic right ear pain 07/2019   Coronary artery disease    Depression 06/12/11   Buproprion per Dr. Lamonte Sakai   Drainage from ear, right 07/2019   Dyspnea    Full dentures    Fitting Per Dr. 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 of Cisplatin and Dhydrtion   Skin rash 01/2019   Smoking 06/12/11   1/2 pack/day   Status post chemotherapy 10/30/10 - 11/09/10    2 doses of Q 3 week Cisplatin   Status post radiation therapy 10/11/10 - 12/24/10   Bilateral Neck and Mucosa axis /  70 gray in 35 fractions   Xerostomia      Family History  Problem Relation Age of Onset   Cancer Father 29       colon   Cancer Sister 10       leukemia     Social History   Socioeconomic History   Marital status: Single    Spouse name: Not on file   Number of children: 3   Years of education: Not on file   Highest education level: Not on file  Occupational History   Not on file  Tobacco Use   Smoking status: Every Day    Packs/day: 0.50    Years: 55.00    Pack years: 27.50    Types: Cigarettes   Smokeless tobacco: Never   Tobacco comments:    Has not smoked since  07/14/21 [facility] 08/04/21  Vaping Use   Vaping Use: Never used  Substance and Sexual Activity   Alcohol use: No    Alcohol/week: 0.0 standard drinks   Drug use: No   Sexual activity: Not Currently  Other Topics Concern   Not on file  Social History Narrative   Not  on file   Social Determinants of Health   Financial Resource Strain: Low Risk    Difficulty of Paying Living Expenses: Not hard at all  Food Insecurity: No Food Insecurity   Worried About Charity fundraiser in the Last Year: Never true   Arboriculturist in the Last Year: Never true  Transportation Needs: No Transportation Needs   Lack of Transportation (Medical): No   Lack of Transportation (Non-Medical): No  Physical Activity: Inactive   Days of Exercise per Week: 0 days   Minutes of Exercise per Session: 0 min  Stress: Stress Concern Present   Feeling of Stress : To some extent  Social Connections: Socially Isolated   Frequency of Communication with Friends and Family: More than three times a week   Frequency of Social Gatherings with Friends and Family: Twice a week    Attends Religious Services: Never   Marine scientist or Organizations: No   Attends Music therapist: Not on file   Marital Status: Widowed  Human resources officer Violence: Not At Risk   Fear of Current or Ex-Partner: No   Emotionally Abused: No   Physically Abused: No   Sexually Abused: No     Allergies  Allergen Reactions   Codeine Nausea Only     Outpatient Medications Prior to Visit  Medication Sig Dispense Refill   amLODipine (NORVASC) 5 MG tablet Take 1 tablet (5 mg total) by mouth daily. 30 tablet 1   aspirin EC 81 MG EC tablet Take 1 tablet (81 mg total) by mouth daily. Swallow whole. 30 tablet 11   atorvastatin (LIPITOR) 40 MG tablet TAKE 1 TABLET (40 MG TOTAL) BY MOUTH DAILY. (Patient taking differently: Take 40 mg by mouth daily.) 90 tablet 3   clopidogrel (PLAVIX) 75 MG tablet Take 1 tablet (75 mg total) by mouth daily. 30 tablet 6   ferrous [id number] C-folic acid (TRINSICON / FOLTRIN) capsule Take 1 capsule by mouth 2 (two) times daily after a meal. 60 capsule 1   hydrALAZINE (APRESOLINE) 25 MG tablet TAKE 2 TABLETS (50 MG TOTAL) BY MOUTH EVERY 8 (EIGHT) HOURS. 180 tablet 3   isosorbide mononitrate (IMDUR) 30 MG 24 hr tablet TAKE 1 TABLET (30 MG TOTAL) BY MOUTH DAILY. (Patient taking differently: Take 30 mg by mouth daily.) 90 tablet 3   levothyroxine (SYNTHROID) 112 MCG tablet Take 1 tablet (112 mcg total) by mouth daily. 90 tablet 3   metoprolol tartrate (LOPRESSOR) 25 MG tablet TAKE 1 TABLET (25 MG TOTAL) BY MOUTH 2 (TWO) TIMES DAILY. 180 tablet 3   oxyCODONE (OXY IR/ROXICODONE) 5 MG immediate release tablet Take 1 tablet (5 mg total) by mouth every 6 (six) hours as needed for moderate pain or severe pain. 30 tablet 0   tamsulosin (FLOMAX) 0.4 MG CAPS capsule Take 1 capsule (0.4 mg total) by mouth daily. 90 capsule 3   Vitamin D, Ergocalciferol, (DRISDOL) 1.25 MG (50000 UNIT) CAPS capsule TAKE 1 CAPSULE (50,000 UNITS TOTAL) BY MOUTH EVERY 7  (SEVEN) DAYS. 5 capsule 3   albuterol (VENTOLIN HFA) 108 (90 Base) MCG/ACT inhaler INHALE 2 PUFFS INTO THE LUNGS EVERY 4 (FOUR) HOURS AS NEEDED FOR WHEEZING OR SHORTNESS OF BREATH (COUGH, SHORTNESS OF BREATH OR WHEEZING.). 8.5 g 11   Budeson-Glycopyrrol-Formoterol (BREZTRI AEROSPHERE) 160-9-4.8 MCG/ACT AERO Inhale 2 puffs into the lungs in the morning and at bedtime. 5.9 g 0   budesonide-formoterol (SYMBICORT) 80-4.5 MCG/ACT inhaler INHALE 2 PUFFS INTO THE LUNGS 2 (TWO)  TIMES DAILY. 10.2 g 11   mirtazapine (REMERON SOL-TAB) 15 MG disintegrating tablet Take 1 tablet (15 mg total) by mouth at bedtime. (Patient not taking: Reported on 07/14/2021) 90 tablet 3   No facility-administered medications prior to visit.         Objective:   Physical Exam Vitals:   08/04/21 1534  BP: 136/78  Pulse: 61  Temp: 97.8 F (36.6 C)  TempSrc: Oral  SpO2: 99%  Weight: 164 lb 6.4 oz (74.6 kg)  Height: 5\' 9"  (1.753 m)   Gen: Pleasant, well-nourished, in no distress,  normal affect  ENT: No lesions,  mouth clear,  oropharynx clear, no postnasal drip  Neck: No JVD, no stridor  Lungs: No use of accessory muscles, distant, no crackles or wheezing on normal respiration, no wheeze on forced expiration  Cardiovascular: RRR, heart sounds normal, no murmur or gallops, no peripheral edema  Musculoskeletal: No deformities, no cyanosis or clubbing  Neuro: alert, awake, non focal  Skin: Warm, no lesions or rash      Assessment & Plan:  COPD (chronic obstructive pulmonary disease) ([name]) He is having more exertional dyspnea post procedure.  Some of this is due to relative loss of lung function importantly he is out of his bronchodilators.  He needs to be back on [place] which did give him benefit.  We will give him samples and send a prescription.  He also needs a refill on albuterol.  Primary malignant neuroendocrine tumor of lung ([place]) Underwent navigational bronchoscopy and then robotic lobectomy large  cell neuroendocrine tumor.  No positive margins.  He is supposed to follow-up with oncology to discuss any adjuvant therapy that would be indicated.  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  I encouraged him to go to his oncology appointment and hear the options before making a decision about whether to undergo any other therapy.   Baltazar Apo, MD, PhD 08/04/2021, 3:57 PM [facility] [phone] or if no answer before 7:00PM call [phone] For any issues after 7:00PM please call eLink [phone]

## 2021-08-04 NOTE — Assessment & Plan Note (Signed)
He is having more exertional dyspnea post procedure.  Some of this is due to relative loss of lung function importantly he is out of his bronchodilators.  He needs to be back on [place] which did give him benefit.  We will give him samples and send a prescription.  He also needs a refill on albuterol.

## 2021-08-05 ENCOUNTER — Other Ambulatory Visit: Payer: Self-pay

## 2021-08-06 ENCOUNTER — Other Ambulatory Visit: Payer: Self-pay

## 2021-08-07 ENCOUNTER — Emergency Department (HOSPITAL_COMMUNITY)
Admission: EM | Admit: 2021-08-07 | Discharge: 2021-08-07 | Disposition: A | Discharge status: Home / Self Care | Payer: Medicare Other | Attending: Emergency Medicine | Admitting: Emergency Medicine

## 2021-08-07 ENCOUNTER — Other Ambulatory Visit: Payer: Self-pay

## 2021-08-07 DIAGNOSIS — Z7982 Long term (current) use of aspirin: Secondary | ICD-10-CM | POA: Insufficient documentation

## 2021-08-07 DIAGNOSIS — R339 Retention of urine, unspecified: Secondary | ICD-10-CM | POA: Diagnosis present

## 2021-08-07 DIAGNOSIS — Z79899 Other long term (current) drug therapy: Secondary | ICD-10-CM | POA: Diagnosis not present

## 2021-08-07 DIAGNOSIS — Z7901 Long term (current) use of anticoagulants: Secondary | ICD-10-CM | POA: Diagnosis not present

## 2021-08-07 DIAGNOSIS — N390 Urinary tract infection, site not specified: Secondary | ICD-10-CM | POA: Diagnosis not present

## 2021-08-07 LAB — BASIC METABOLIC PANEL
Anion gap: 8 (ref 5–15)
BUN: 14 mg/dL (ref 8–23)
CO2: 22 mmol/L (ref 22–32)
Calcium: 9 mg/dL (ref 8.9–10.3)
Chloride: 109 mmol/L (ref 98–111)
Creatinine, Ser: 1.4 mg/dL — ABNORMAL HIGH (ref 0.61–1.24)
GFR, Estimated: 55 mL/min — ABNORMAL LOW (ref >60)
Glucose, Bld: 103 mg/dL — ABNORMAL HIGH (ref 70–99)
Potassium: 4 mmol/L (ref 3.5–5.1)
Sodium: 139 mmol/L (ref 135–145)

## 2021-08-07 LAB — URINALYSIS, ROUTINE W REFLEX MICROSCOPIC
Bilirubin Urine: NEGATIVE
Glucose, UA: NEGATIVE mg/dL
Ketones, ur: NEGATIVE mg/dL
Nitrite: POSITIVE — AB
Protein, ur: NEGATIVE mg/dL
Specific Gravity, Urine: 1.013 (ref 1.005–1.030)
pH: 6 (ref 5.0–8.0)

## 2021-08-07 LAB — CBC WITH DIFFERENTIAL/PLATELET
Abs Immature Granulocytes: 0.07 10*3/uL (ref 0.00–0.07)
Basophils Absolute: 0 10*3/uL (ref 0.0–0.1)
Basophils Relative: 0 %
Eosinophils Absolute: 0.3 10*3/uL (ref 0.0–0.5)
Eosinophils Relative: 3 %
HCT: 31.3 % — ABNORMAL LOW (ref 39.0–52.0)
Hemoglobin: 9 g/dL — ABNORMAL LOW (ref 13.0–17.0)
Immature Granulocytes: 1 %
Lymphocytes Relative: 5 %
Lymphs Abs: 0.5 10*3/uL — ABNORMAL LOW (ref 0.7–4.0)
MCH: 20.9 pg — ABNORMAL LOW (ref 26.0–34.0)
MCHC: 28.8 g/dL — ABNORMAL LOW (ref 30.0–36.0)
MCV: 72.8 fL — ABNORMAL LOW (ref 80.0–100.0)
Monocytes Absolute: 0.6 10*3/uL (ref 0.1–1.0)
Monocytes Relative: 6 %
Neutro Abs: 8.7 10*3/uL — ABNORMAL HIGH (ref 1.7–7.7)
Neutrophils Relative %: 85 %
Platelets: 312 10*3/uL (ref 150–400)
RBC: 4.3 MIL/uL (ref 4.22–5.81)
RDW: 22.7 % — ABNORMAL HIGH (ref 11.5–15.5)
WBC: 10.1 10*3/uL (ref 4.0–10.5)
nRBC: 0 % (ref 0.0–0.2)

## 2021-08-07 MED ORDER — CEPHALEXIN 500 MG PO CAPS: 500.0000 mg | ORAL_CAPSULE | Freq: Three times a day (TID) | ORAL | 0 refills | Status: AC

## 2021-08-07 NOTE — ED Provider Notes (Signed)
[facility] DEPT Provider Note   CSN: [id number] Arrival date & time: 08/07/21  1121     History {Add pertinent medical, surgical, social history, OB history to HPI:1} Chief Complaint  Patient presents with   Urinary Retention    [name] [name] is a 66 y.o. male.  HPI     Home Medications Prior to Admission medications   Medication Sig Start Date End Date Taking? Authorizing Provider  cephALEXin (KEFLEX) 500 MG capsule Take 1 capsule (500 mg total) by mouth 3 (three) times daily for 7 days. 08/07/21 08/14/21 Yes Shaquasha Gerstel, Ellwood Dense, MD  albuterol (VENTOLIN HFA) 108 (90 Base) MCG/ACT inhaler INHALE 2 PUFFS INTO THE LUNGS EVERY 4 (FOUR) HOURS AS NEEDED FOR WHEEZING OR SHORTNESS OF BREATH (COUGH, SHORTNESS OF BREATH OR WHEEZING.). 07/31/21 07/31/22  [name], MD  amLODipine (NORVASC) 5 MG tablet Take 1 tablet (5 mg total) by mouth daily. 07/29/21   Gold, Wilder Glade, PA-C  aspirin EC 81 MG EC tablet Take 1 tablet (81 mg total) by mouth daily. Swallow whole. 03/06/21   Dagoberto Ligas, PA-C  atorvastatin (LIPITOR) 40 MG tablet TAKE 1 TABLET (40 MG TOTAL) BY MOUTH DAILY. Patient taking differently: Take 40 mg by mouth daily. 12/11/20 12/11/21  Vevelyn Francois, NP  Budeson-Glycopyrrol-Formoterol (BREZTRI AEROSPHERE) 160-9-4.8 MCG/ACT AERO Inhale 2 puffs into the lungs in the morning and at bedtime. 08/04/21   Collene Gobble, MD  budesonide-formoterol (SYMBICORT) 80-4.5 MCG/ACT inhaler INHALE 2 PUFFS INTO THE LUNGS 2 (TWO) TIMES DAILY. 07/31/21 07/31/22  [name], MD  clopidogrel (PLAVIX) 75 MG tablet Take 1 tablet (75 mg total) by mouth daily. 04/07/21   Marty Heck, MD  ferrous [id number] C-folic acid (TRINSICON / FOLTRIN) capsule Take 1 capsule by mouth 2 (two) times daily after a meal. 07/28/21   Gold, Wayne E, PA-C  hydrALAZINE (APRESOLINE) 25 MG tablet TAKE 2 TABLETS (50 MG TOTAL) BY MOUTH EVERY 8 (EIGHT) HOURS. 06/22/21 06/22/22   [place], Jake Church I, NP  isosorbide mononitrate (IMDUR) 30 MG 24 hr tablet TAKE 1 TABLET (30 MG TOTAL) BY MOUTH DAILY. Patient taking differently: Take 30 mg by mouth daily. 12/31/20 12/31/21  Vevelyn Francois, NP  levothyroxine (SYNTHROID) 112 MCG tablet Take 1 tablet (112 mcg total) by mouth daily. 08/22/20 10/04/21  Vevelyn Francois, NP  metoprolol tartrate (LOPRESSOR) 25 MG tablet TAKE 1 TABLET (25 MG TOTAL) BY MOUTH 2 (TWO) TIMES DAILY. 12/11/20 12/11/21  Vevelyn Francois, NP  mirtazapine (REMERON SOL-TAB) 15 MG disintegrating tablet Take 1 tablet (15 mg total) by mouth at bedtime. Patient not taking: Reported on 07/14/2021 07/10/20 07/10/21  Vevelyn Francois, NP  oxyCODONE (OXY IR/ROXICODONE) 5 MG immediate release tablet Take 1 tablet (5 mg total) by mouth every 6 (six) hours as needed for moderate pain or severe pain. 07/28/21   Gold, Wilder Glade, PA-C  tamsulosin (FLOMAX) 0.4 MG CAPS capsule Take 1 capsule (0.4 mg total) by mouth daily. 08/21/20 08/21/21  Vevelyn Francois, NP  Vitamin D, Ergocalciferol, (DRISDOL) 1.25 MG (50000 UNIT) CAPS capsule TAKE 1 CAPSULE (50,000 UNITS TOTAL) BY MOUTH EVERY 7 (SEVEN) DAYS. 04/18/21 04/18/22  Vevelyn Francois, NP      Allergies    Codeine    Review of Systems   Review of Systems  Physical Exam Updated Vital Signs BP 133/61   Pulse (!) 52   Temp 97.7 F (36.5 C) (Oral)   Resp 14   SpO2 97%  Physical Exam  ED Results / Procedures / Treatments   Labs (all labs ordered are listed, but only abnormal results are displayed) Labs Reviewed  URINALYSIS, ROUTINE W REFLEX MICROSCOPIC - Abnormal; Notable for the following components:      Result Value   APPearance HAZY (*)    Hgb urine dipstick SMALL (*)    Nitrite POSITIVE (*)    Leukocytes,Ua TRACE (*)    Bacteria, UA RARE (*)    All other components within normal limits  CBC WITH DIFFERENTIAL/PLATELET - Abnormal; Notable for the following components:   Hemoglobin 9.0 (*)    HCT 31.3 (*)    MCV 72.8 (*)    MCH  20.9 (*)    MCHC 28.8 (*)    RDW 22.7 (*)    Neutro Abs 8.7 (*)    Lymphs Abs 0.5 (*)    All other components within normal limits  BASIC METABOLIC PANEL - Abnormal; Notable for the following components:   Glucose, Bld 103 (*)    Creatinine, Ser 1.40 (*)    GFR, Estimated 55 (*)    All other components within normal limits    EKG None  Radiology No results found.  Procedures Procedures  {Document cardiac monitor, telemetry assessment procedure when appropriate:1}  Medications Ordered in ED Medications - No data to display  ED Course/ Medical Decision Making/ A&P                           Medical Decision Making Amount and/or Complexity of Data Reviewed Labs: ordered.  Risk Prescription drug management.   ***  {Document critical care time when appropriate:1} {Document review of labs and clinical decision tools ie heart score, Chads2Vasc2 etc:1}  {Document your independent review of radiology images, and any outside records:1} {Document your discussion with family members, caretakers, and with consultants:1} {Document social determinants of health affecting pt's care:1} {Document your decision making why or why not admission, treatments were needed:1} Final Clinical Impression(s) / ED Diagnoses Final diagnoses:  Urinary retention  Urinary tract infection without hematuria, site unspecified    Rx / DC Orders ED Discharge Orders          Ordered    cephALEXin (KEFLEX) 500 MG capsule  3 times daily        08/07/21 1452

## 2021-08-07 NOTE — ED Triage Notes (Signed)
Pt BIBA from home. Pt had urinary catheter removed by urologist yesterday. Pt has been unable to urinate since.   Aox4  BP: 148/80 HR: 65 SPO2: 98

## 2021-08-07 NOTE — Discharge Instructions (Signed)
Please follow-up with urology, call their office this afternoon to request an appointment to be seen sometime in the next couple days.  Come back to ER if you are having any issues with your Foley catheter or you develop other new concerning symptoms.  Take antibiotic as prescribed for possible UTI.

## 2021-08-10 ENCOUNTER — Other Ambulatory Visit: Payer: Self-pay

## 2021-08-11 ENCOUNTER — Inpatient Hospital Stay: Payer: Medicare Other

## 2021-08-11 ENCOUNTER — Inpatient Hospital Stay: Payer: Medicare Other | Attending: Internal Medicine | Admitting: Internal Medicine

## 2021-08-12 ENCOUNTER — Other Ambulatory Visit: Payer: Self-pay

## 2021-08-17 ENCOUNTER — Telehealth: Payer: Self-pay | Admitting: Internal Medicine

## 2021-08-17 NOTE — Telephone Encounter (Signed)
R/s pt's missed new pt appt. Pt is aware of new appt date and time.

## 2021-08-21 ENCOUNTER — Encounter: Payer: Self-pay | Admitting: *Deleted

## 2021-08-21 ENCOUNTER — Other Ambulatory Visit: Payer: Self-pay

## 2021-09-02 ENCOUNTER — Other Ambulatory Visit: Payer: Self-pay | Admitting: Thoracic Surgery (Cardiothoracic Vascular Surgery)

## 2021-09-02 DIAGNOSIS — Z902 Acquired absence of lung [part of]: Secondary | ICD-10-CM

## 2021-09-03 ENCOUNTER — Ambulatory Visit: Payer: Self-pay | Admitting: Thoracic Surgery (Cardiothoracic Vascular Surgery)

## 2021-09-03 ENCOUNTER — Encounter: Payer: Self-pay | Admitting: Thoracic Surgery (Cardiothoracic Vascular Surgery)

## 2021-09-03 NOTE — Progress Notes (Unsigned)
      [address]       [place] [address]             [phone]        Sahan A Dikes [facility] Medical Record #[id number] Date of Birth: 02-Jan-1956  Referring: Collene Gobble, MD Primary Care: Pcp, No Primary Cardiologist:Thomas Claiborne Billings, MD  Reason for visit:   follow-up  History of Present Illness:     ***  Physical Exam: There were no vitals taken for this visit.  Alert NAD Incision ***.  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  He will meet with medical oncology to continue surveillance.   [name] 09/03/2021 10:16 AM

## 2021-09-04 ENCOUNTER — Ambulatory Visit: Payer: Medicare Other | Admitting: Thoracic Surgery (Cardiothoracic Vascular Surgery)

## 2021-09-08 ENCOUNTER — Inpatient Hospital Stay: Payer: Medicare Other

## 2021-09-08 ENCOUNTER — Inpatient Hospital Stay: Payer: Medicare Other | Attending: Internal Medicine | Admitting: Internal Medicine

## 2021-09-08 ENCOUNTER — Encounter: Payer: Self-pay | Admitting: *Deleted

## 2021-09-08 NOTE — Progress Notes (Signed)
[name] [name] was a no show to his second scheduled appt with Dr. Julien Nordmann. I notified new patient coordinator to call and schedule him at next available appt.

## 2021-09-09 ENCOUNTER — Telehealth: Payer: Self-pay | Admitting: Internal Medicine

## 2021-09-09 NOTE — Telephone Encounter (Signed)
Pt no showed second appt with Dr. Julien Nordmann. I called pt, no answer. Left msg to have pt call back to r/s appt.

## 2021-09-10 ENCOUNTER — Telehealth: Payer: Self-pay | Admitting: Internal Medicine

## 2021-09-10 NOTE — Telephone Encounter (Signed)
Pt no showed second appt with Dr. Julien Nordmann. I called pt, no answer. Left msg to have pt call back to r/s appt.

## 2021-09-11 ENCOUNTER — Other Ambulatory Visit: Payer: Self-pay

## 2021-09-11 ENCOUNTER — Telehealth: Payer: Self-pay | Admitting: Internal Medicine

## 2021-09-11 NOTE — Telephone Encounter (Signed)
Pt no showed second appt with Dr. Julien Nordmann. I called pt, no answer. Left msg to have pt call back to r/s appt.

## 2021-09-14 ENCOUNTER — Other Ambulatory Visit (HOSPITAL_COMMUNITY): Payer: Self-pay

## 2021-09-16 ENCOUNTER — Other Ambulatory Visit: Payer: Self-pay

## 2021-09-16 MED ORDER — NITROFURANTOIN MONOHYD MACRO 100 MG PO CAPS
ORAL_CAPSULE | ORAL | 0 refills | Status: DC
  Filled 2021-09-16: qty 10, 5d supply, fill #0

## 2021-09-16 MED ORDER — OXYBUTYNIN CHLORIDE 5 MG PO TABS
ORAL_TABLET | ORAL | 0 refills | Status: DC
  Filled 2021-09-16: qty 15, 5d supply, fill #0

## 2021-09-23 ENCOUNTER — Other Ambulatory Visit: Payer: Self-pay

## 2021-09-23 ENCOUNTER — Other Ambulatory Visit: Payer: Self-pay | Admitting: Nurse Practitioner

## 2021-09-24 ENCOUNTER — Other Ambulatory Visit: Payer: Self-pay

## 2021-09-25 ENCOUNTER — Other Ambulatory Visit: Payer: Self-pay

## 2021-10-21 ENCOUNTER — Other Ambulatory Visit: Payer: Self-pay

## 2021-10-21 ENCOUNTER — Other Ambulatory Visit: Payer: Self-pay | Admitting: Nurse Practitioner

## 2021-10-21 DIAGNOSIS — E559 Vitamin D deficiency, unspecified: Secondary | ICD-10-CM

## 2021-10-21 MED ORDER — VITAMIN D (ERGOCALCIFEROL) 1.25 MG (50000 UNIT) PO CAPS
ORAL_CAPSULE | ORAL | 3 refills | Status: DC
  Filled 2021-10-21: qty 4, 28d supply, fill #0
  Filled 2021-11-12: qty 4, 28d supply, fill #1
  Filled 2021-12-15: qty 4, 28d supply, fill #2
  Filled 2022-01-26: qty 4, 28d supply, fill #3
  Filled 2022-02-23: qty 4, 28d supply, fill #4
  Filled ????-??-??: fill #4

## 2021-10-21 MED ORDER — AMLODIPINE BESYLATE 10 MG PO TABS
10.0000 mg | ORAL_TABLET | Freq: Every morning | ORAL | 0 refills | Status: DC
  Filled 2021-10-21: qty 90, 90d supply, fill #0

## 2021-10-22 ENCOUNTER — Other Ambulatory Visit: Payer: Self-pay

## 2021-10-23 ENCOUNTER — Other Ambulatory Visit: Payer: Self-pay

## 2021-11-12 ENCOUNTER — Other Ambulatory Visit: Payer: Self-pay

## 2021-11-13 ENCOUNTER — Other Ambulatory Visit: Payer: Self-pay

## 2021-11-16 ENCOUNTER — Encounter: Payer: Self-pay | Admitting: Nurse Practitioner

## 2021-11-16 ENCOUNTER — Ambulatory Visit (INDEPENDENT_AMBULATORY_CARE_PROVIDER_SITE_OTHER): Payer: Medicare Other | Admitting: Nurse Practitioner

## 2021-11-16 VITALS — BP 101/63 | Temp 97.1°F | Ht 69.0 in | Wt 168.2 lb

## 2021-11-16 DIAGNOSIS — E039 Hypothyroidism, unspecified: Secondary | ICD-10-CM

## 2021-11-16 MED ORDER — LEVOTHYROXINE SODIUM 112 MCG PO TABS: 112.0000 ug | ORAL_TABLET | Freq: Every day | ORAL | 3 refills | Status: DC

## 2021-11-16 NOTE — Patient Instructions (Signed)
1. Hypothyroidism, unspecified type  - TSH - CBC - Comprehensive metabolic panel - levothyroxine (SYNTHROID) 112 MCG tablet; Take 1 tablet (112 mcg total) by mouth daily.  Dispense: 90 tablet; Refill: 3  Follow up:  Follow up in 6 months

## 2021-11-16 NOTE — Progress Notes (Unsigned)
@Patient  ID: [name] [name], male    DOB: 1956/02/11, 66 y.o.   MRN: [id number]  Chief Complaint  Patient presents with   Follow-up    Pt is here for follow up visit. Pt is requesting a refill on levothyroxine    Referring provider: Fenton Foy, NP   HPI  [name] [name] 66 y.o. 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 Status post left lower lobectomy. To the [facility] for follow up visit.   Hypothyroidism FERLIN [name] is a 66 y.o. male who presents for follow up of hypothyroidism. Current symptoms: none. Patient denies diarrhea, heat / cold intolerance, nervousness, palpitations, and weight changes. Symptoms have stabilized.  Denies f/c/s, n/v/d, hemoptysis, PND, leg swelling Denies chest pain or edema    Allergies  Allergen Reactions   Codeine Nausea Only    Immunization History  Administered Date(s) Administered   Influenza,inj,Quad PF,6+ Mos 11/16/2016, 01/02/2020   PFIZER(Purple Top)SARS-COV-2 Vaccination 08/23/2019, 09/11/2019, 08/19/2020   Pneumococcal Polysaccharide-23 06/24/2015    Past Medical History:  Diagnosis Date   Arthritis    Chronic right ear pain 07/2019   Coronary artery disease    Depression 06/12/11   Buproprion per Dr. Lamonte Sakai   Drainage from ear, right 07/2019   Dyspnea    Full dentures    Fitting Per Dr. 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 of Cisplatin and Dhydrtion   Skin rash 01/2019   Smoking 06/12/11   1/2 pack/day   Status post chemotherapy 10/30/10 - 11/09/10    2 doses of Q 3 week Cisplatin   Status post radiation therapy 10/11/10 - 12/24/10   Bilateral Neck and Mucosa axis /  70 gray in 35 fractions   Xerostomia     Tobacco History: Social History   Tobacco Use  Smoking Status Every Day   Packs/day: 0.50   Years: 55.00   Total pack years: 27.50   Types: Cigarettes  Smokeless Tobacco Never  Tobacco Comments   Has not smoked since  07/14/21 [facility] 08/04/21   Ready to quit: Not Answered Counseling given: Not Answered Tobacco comments: Has not smoked since  07/14/21 [facility] 08/04/21   Outpatient Encounter Medications as of 11/16/2021  Medication Sig   albuterol (VENTOLIN HFA) 108 (90 Base) MCG/ACT inhaler INHALE 2 PUFFS INTO THE LUNGS EVERY 4 (FOUR) HOURS AS NEEDED FOR WHEEZING OR SHORTNESS OF BREATH (COUGH, SHORTNESS OF BREATH OR WHEEZING.). (Patient taking differently: 2 puffs every 4 (four) hours as needed for wheezing or shortness of breath (cough).)   amLODipine (NORVASC) 10 MG tablet Take 1 tablet (10 mg total) by mouth every morning.   amLODipine (NORVASC) 5 MG tablet Take 1 tablet (5 mg total) by mouth daily.   aspirin EC 81 MG EC tablet Take 1 tablet (81 mg total) by mouth daily. Swallow whole.   atorvastatin (LIPITOR) 40 MG tablet TAKE 1 TABLET (40 MG TOTAL) BY MOUTH  DAILY. (Patient taking differently: Take 40 mg by mouth daily.)   Budeson-Glycopyrrol-Formoterol (BREZTRI AEROSPHERE) 160-9-4.8 MCG/ACT AERO Inhale 2 puffs into the lungs in the morning and at bedtime. (Patient taking differently: Inhale 2 puffs into the lungs every morning.)   budesonide-formoterol (SYMBICORT) 80-4.5 MCG/ACT inhaler INHALE 2 PUFFS INTO THE LUNGS 2 (TWO) TIMES DAILY. (Patient taking differently: Inhale 2 puffs into the lungs at bedtime.)   clopidogrel (PLAVIX) 75 MG tablet Take 1 tablet (75  mg total) by mouth daily.   docusate sodium (COLACE) 100 MG capsule Take 100 mg by mouth at bedtime.   hydrALAZINE (APRESOLINE) 25 MG tablet TAKE 2 TABLETS (50 MG TOTAL) BY MOUTH EVERY 8 (EIGHT) HOURS. (Patient taking differently: Take 50 mg by mouth every 8 (eight) hours.)   isosorbide mononitrate (IMDUR) 30 MG 24 hr tablet TAKE 1 TABLET (30 MG TOTAL) BY MOUTH DAILY. (Patient taking differently: Take 30 mg by mouth daily.)   metoprolol tartrate (LOPRESSOR) 25 MG tablet TAKE 1 TABLET (25 MG TOTAL) BY MOUTH 2 (TWO) TIMES DAILY. (Patient taking differently: Take 25 mg by mouth 2 (two) times daily.)   Vitamin D, Ergocalciferol, (DRISDOL) 1.25 MG (50000 UNIT) CAPS capsule TAKE 1 CAPSULE BY MOUTH EVERY 7 DAYS.   levothyroxine (SYNTHROID) 112 MCG tablet Take 1 tablet (112 mcg total) by mouth daily.   mirtazapine (REMERON SOL-TAB) 15 MG disintegrating tablet Take 1 tablet (15 mg total) by mouth at bedtime. (Patient not taking: Reported on 07/14/2021)   nitrofurantoin, macrocrystal-monohydrate, (MACROBID) 100 MG capsule Take 1 capsule (100 mg total) by mouth 2 times daily for 5 days. (Patient not taking: Reported on 11/16/2021)   oxybutynin (DITROPAN) 5 MG tablet Take 1 tablet (5 mg total) by mouth 3 (three)  times daily as needed (bladder spasm/leakage around catheter). (Patient not taking: Reported on 11/16/2021)   oxyCODONE (OXY IR/ROXICODONE) 5 MG immediate release tablet Take 1 tablet (5 mg total) by mouth every 6 (six) hours as needed for moderate pain or severe pain. (Patient not taking: Reported on 11/16/2021)   [DISCONTINUED] ferrous [id number] C-folic acid (TRINSICON / FOLTRIN) capsule Take 1 capsule by mouth 2 (two) times daily after a meal. (Patient not taking: Reported on 08/07/2021)   [DISCONTINUED] levothyroxine (SYNTHROID) 112 MCG tablet Take 1 tablet (112 mcg total) by mouth daily.   No facility-administered encounter medications on file as of 11/16/2021.     Review of Systems  Review  of Systems  Constitutional: Negative.   HENT: Negative.    Cardiovascular: Negative.   Gastrointestinal: Negative.   Allergic/Immunologic: Negative.   Neurological: Negative.   Psychiatric/Behavioral: Negative.         Physical Exam  BP 101/63 (BP Location: Left Arm, Patient Position: Sitting, Cuff Size: Normal)   Temp (!) 97.1 F (36.2 C)   Ht 5\' 9"  (1.753 m)   Wt 168 lb 3.2 oz (76.3 kg)   SpO2 95%   BMI 24.84 kg/m   Wt Readings from Last 5 Encounters:  11/16/21 168 lb 3.2 oz (76.3 kg)  08/04/21 164 lb 6.4 oz (74.6 kg)  07/31/21 164 lb (74.4 kg)  07/27/21 155 lb 3.3 oz (70.4 kg)  07/16/21 168 lb 11.2 oz (76.5 kg)     Physical Exam Vitals and nursing note reviewed.  Constitutional:      General: He is not in acute distress.    Appearance: He is well-developed.  Cardiovascular:     Rate and Rhythm: Normal rate and regular rhythm.  Pulmonary:  Effort: Pulmonary effort is normal.     Breath sounds: Normal breath sounds.  Skin:    General: Skin is warm and dry.  Neurological:     Mental Status: He is alert and oriented to person, place, and time.       Assessment & Plan:   Hypothyroidism - TSH - CBC - Comprehensive metabolic panel - levothyroxine (SYNTHROID) 112 MCG tablet; Take 1 tablet (112 mcg total) by mouth daily.  Dispense: 90 tablet; Refill: 3  Follow up:  Follow up in 6 months     Fenton Foy, NP 11/16/2021

## 2021-11-16 NOTE — Assessment & Plan Note (Signed)
-   TSH - CBC - Comprehensive metabolic panel - levothyroxine (SYNTHROID) 112 MCG tablet; Take 1 tablet (112 mcg total) by mouth daily.  Dispense: 90 tablet; Refill: 3  Follow up:  Follow up in 6 months

## 2021-11-17 LAB — COMPREHENSIVE METABOLIC PANEL
ALT: 10 IU/L (ref 0–44)
AST: 10 IU/L (ref 0–40)
Albumin/Globulin Ratio: 1.7 (ref 1.2–2.2)
Albumin: 3.9 g/dL (ref 3.9–4.9)
Alkaline Phosphatase: 102 IU/L (ref 44–121)
BUN/Creatinine Ratio: 9 — ABNORMAL LOW (ref 10–24)
BUN: 15 mg/dL (ref 8–27)
Bilirubin Total: 0.3 mg/dL (ref 0.0–1.2)
CO2: 19 mmol/L — ABNORMAL LOW (ref 20–29)
Calcium: 8.7 mg/dL (ref 8.6–10.2)
Chloride: 107 mmol/L — ABNORMAL HIGH (ref 96–106)
Creatinine, Ser: 1.65 mg/dL — ABNORMAL HIGH (ref 0.76–1.27)
Globulin, Total: 2.3 g/dL (ref 1.5–4.5)
Glucose: 96 mg/dL (ref 70–99)
Potassium: 4.3 mmol/L (ref 3.5–5.2)
Sodium: 141 mmol/L (ref 134–144)
Total Protein: 6.2 g/dL (ref 6.0–8.5)
eGFR: 46 mL/min/{1.73_m2} — ABNORMAL LOW (ref >59)

## 2021-11-17 LAB — CBC
Hematocrit: 31.3 % — ABNORMAL LOW (ref 37.5–51.0)
Hemoglobin: 8.8 g/dL — ABNORMAL LOW (ref 13.0–17.7)
MCH: 19.6 pg — ABNORMAL LOW (ref 26.6–33.0)
MCHC: 28.1 g/dL — ABNORMAL LOW (ref 31.5–35.7)
MCV: 70 fL — ABNORMAL LOW (ref 79–97)
Platelets: 284 10*3/uL (ref 150–450)
RBC: 4.5 x10E6/uL (ref 4.14–5.80)
RDW: 16.7 % — ABNORMAL HIGH (ref 11.6–15.4)
WBC: 13.7 10*3/uL — ABNORMAL HIGH (ref 3.4–10.8)

## 2021-11-17 LAB — TSH: TSH: 63.9 u[IU]/mL — ABNORMAL HIGH (ref 0.450–4.500)

## 2021-11-18 ENCOUNTER — Telehealth: Payer: Self-pay

## 2021-11-18 NOTE — Telephone Encounter (Signed)
Pt called to get his results

## 2021-11-19 NOTE — Telephone Encounter (Signed)
Patient read message on 11/18/2021 at 0943.   Called patient to see if he has been taking Rx.  Left message on voicemail that provider wants to know if he has been taking medication or not.   Patient will call with this answer. Please record answer and forward to provider

## 2021-12-02 ENCOUNTER — Other Ambulatory Visit: Payer: Self-pay | Admitting: Vascular Surgery

## 2021-12-15 ENCOUNTER — Other Ambulatory Visit: Payer: Self-pay

## 2021-12-15 ENCOUNTER — Other Ambulatory Visit: Payer: Self-pay | Admitting: Nurse Practitioner

## 2021-12-15 DIAGNOSIS — I10 Essential (primary) hypertension: Secondary | ICD-10-CM

## 2021-12-15 MED ORDER — METOPROLOL TARTRATE 25 MG PO TABS
25.0000 mg | ORAL_TABLET | Freq: Two times a day (BID) | ORAL | 3 refills | Status: DC
  Filled 2021-12-15: qty 180, 90d supply, fill #0
  Filled 2022-03-09: qty 180, 90d supply, fill #1
  Filled 2022-06-08: qty 180, 90d supply, fill #2
  Filled 2022-08-24: qty 180, 90d supply, fill #3

## 2021-12-16 ENCOUNTER — Ambulatory Visit: Payer: Self-pay | Admitting: Nurse Practitioner

## 2021-12-16 ENCOUNTER — Other Ambulatory Visit: Payer: Self-pay

## 2021-12-24 ENCOUNTER — Other Ambulatory Visit: Payer: Self-pay

## 2021-12-28 ENCOUNTER — Other Ambulatory Visit: Payer: Self-pay

## 2021-12-29 ENCOUNTER — Other Ambulatory Visit: Payer: Self-pay

## 2021-12-29 ENCOUNTER — Other Ambulatory Visit: Payer: Self-pay | Admitting: Nurse Practitioner

## 2021-12-29 DIAGNOSIS — I10 Essential (primary) hypertension: Secondary | ICD-10-CM

## 2021-12-31 ENCOUNTER — Other Ambulatory Visit: Payer: Self-pay

## 2021-12-31 MED ORDER — HYDRALAZINE HCL 25 MG PO TABS
ORAL_TABLET | ORAL | 3 refills | Status: DC
  Filled 2021-12-31: qty 180, 30d supply, fill #0
  Filled 2022-02-15: qty 180, 30d supply, fill #1
  Filled 2022-03-16: qty 180, 30d supply, fill #2
  Filled 2022-04-28: qty 180, 30d supply, fill #3

## 2021-12-31 NOTE — Telephone Encounter (Signed)
BP med'S

## 2022-01-01 ENCOUNTER — Other Ambulatory Visit: Payer: Self-pay

## 2022-01-26 ENCOUNTER — Other Ambulatory Visit: Payer: Self-pay | Admitting: Nurse Practitioner

## 2022-01-26 DIAGNOSIS — I10 Essential (primary) hypertension: Secondary | ICD-10-CM

## 2022-01-27 ENCOUNTER — Other Ambulatory Visit: Payer: Self-pay

## 2022-01-28 ENCOUNTER — Other Ambulatory Visit: Payer: Self-pay

## 2022-01-28 MED ORDER — ISOSORBIDE MONONITRATE ER 30 MG PO TB24
30.0000 mg | ORAL_TABLET | Freq: Three times a day (TID) | ORAL | 3 refills | Status: DC | PRN
  Filled 2022-01-28: qty 90, 30d supply, fill #0
  Filled 2022-02-23 – 2022-02-24 (×2): qty 90, 30d supply, fill #1
  Filled 2022-05-24: qty 90, 30d supply, fill #2
  Filled 2022-08-24: qty 90, 30d supply, fill #3

## 2022-02-02 ENCOUNTER — Other Ambulatory Visit: Payer: Self-pay

## 2022-02-02 ENCOUNTER — Other Ambulatory Visit: Payer: Self-pay | Admitting: Nurse Practitioner

## 2022-02-11 ENCOUNTER — Other Ambulatory Visit: Payer: Self-pay | Admitting: Nurse Practitioner

## 2022-02-11 DIAGNOSIS — I10 Essential (primary) hypertension: Secondary | ICD-10-CM

## 2022-02-12 ENCOUNTER — Other Ambulatory Visit: Payer: Self-pay

## 2022-02-12 MED ORDER — ATORVASTATIN CALCIUM 40 MG PO TABS
40.0000 mg | ORAL_TABLET | Freq: Every day | ORAL | 1 refills | Status: DC
  Filled 2022-02-15: qty 90, 90d supply, fill #0
  Filled 2022-05-24: qty 90, 90d supply, fill #1

## 2022-02-15 ENCOUNTER — Other Ambulatory Visit: Payer: Self-pay

## 2022-02-16 ENCOUNTER — Other Ambulatory Visit: Payer: Self-pay

## 2022-02-18 ENCOUNTER — Telehealth: Payer: Self-pay | Admitting: Nurse Practitioner

## 2022-02-18 NOTE — Telephone Encounter (Signed)
Left message for patient to call back and schedule Medicare Annual Wellness Visit (AWV).   Please offer to do virtually or by telephone.   Last AWV:  08/19/2020   Schedule at any time with [place]   30 minute appointment for Virtual or phone  45 minute appointment for Initial virtual/phone  Any questions, please contact me at [phone]   Thank you,   [facility]  Ambulatory Clinical Support for Care Management [place] [facility] ??[phone] or ??[phone]

## 2022-02-24 ENCOUNTER — Other Ambulatory Visit: Payer: Self-pay

## 2022-02-26 ENCOUNTER — Other Ambulatory Visit: Payer: Self-pay

## 2022-02-26 ENCOUNTER — Other Ambulatory Visit (HOSPITAL_COMMUNITY): Payer: Self-pay

## 2022-02-27 ENCOUNTER — Other Ambulatory Visit (HOSPITAL_COMMUNITY): Payer: Self-pay

## 2022-03-01 ENCOUNTER — Other Ambulatory Visit: Payer: Self-pay

## 2022-03-02 ENCOUNTER — Encounter (HOSPITAL_COMMUNITY): Payer: Self-pay

## 2022-03-02 ENCOUNTER — Other Ambulatory Visit: Payer: Self-pay

## 2022-03-02 ENCOUNTER — Other Ambulatory Visit (HOSPITAL_COMMUNITY): Payer: Self-pay

## 2022-03-05 ENCOUNTER — Other Ambulatory Visit: Payer: Self-pay

## 2022-03-08 ENCOUNTER — Other Ambulatory Visit (HOSPITAL_COMMUNITY): Payer: Self-pay

## 2022-03-15 ENCOUNTER — Other Ambulatory Visit: Payer: Self-pay | Admitting: Physician Assistant

## 2022-03-18 ENCOUNTER — Other Ambulatory Visit: Payer: Self-pay

## 2022-03-25 ENCOUNTER — Other Ambulatory Visit: Payer: Self-pay

## 2022-03-25 ENCOUNTER — Other Ambulatory Visit: Payer: Self-pay | Admitting: Nurse Practitioner

## 2022-03-25 ENCOUNTER — Other Ambulatory Visit (HOSPITAL_COMMUNITY): Payer: Self-pay

## 2022-03-25 DIAGNOSIS — E559 Vitamin D deficiency, unspecified: Secondary | ICD-10-CM

## 2022-03-25 MED ORDER — VITAMIN D (ERGOCALCIFEROL) 1.25 MG (50000 UNIT) PO CAPS
ORAL_CAPSULE | ORAL | 3 refills | Status: DC
  Filled 2022-03-25: qty 5, 35d supply, fill #0
  Filled 2022-04-28: qty 5, 35d supply, fill #1
  Filled 2022-05-24 – 2022-05-25 (×2): qty 5, 35d supply, fill #2
  Filled 2022-06-25: qty 5, 35d supply, fill #3

## 2022-03-26 ENCOUNTER — Other Ambulatory Visit: Payer: Self-pay

## 2022-03-26 ENCOUNTER — Other Ambulatory Visit (HOSPITAL_COMMUNITY): Payer: Self-pay

## 2022-03-27 IMAGING — CT NM PET TUM IMG INITIAL (PI) SKULL BASE T - THIGH
1 of 7 series · 1 of 25 positions shown · non-contrast
Comparison: Lung cancer screening chest CT 03/25/2021

CLINICAL DATA: Initial treatment strategy for 19 mm left lower lobe
pulmonary nodule.

EXAM:
NUCLEAR MEDICINE PET SKULL BASE TO THIGH
TECHNIQUE: 8.5 mCi F-18 FDG was injected intravenously. Full-ring PET imaging
was performed from the skull base to thigh after the radiotracer. CT
data was obtained and used for attenuation correction and anatomic
localization.
Fasting blood glucose: 84 mg/dl

[Series 4: ct sk_thigh 5.0 bf37 · axial · 5.0mm · 0.98mm/px · 1 of 233 slices shown]
[im 233/233  brain]
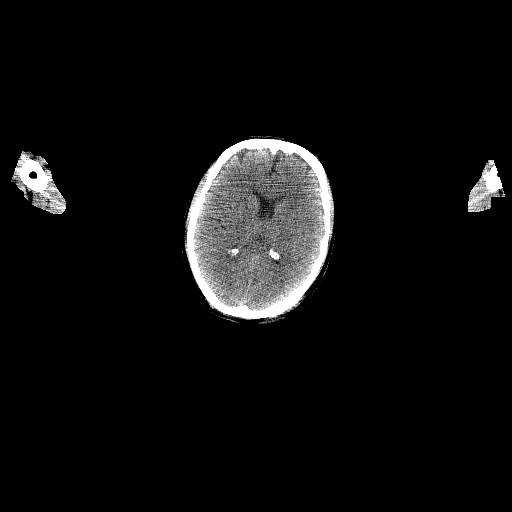

[1 of 25 positions shown; findings below may reference images not displayed]

FINDINGS: Mediastinal blood pool activity: SUV max

Liver activity: SUV max NA

NECK: FDG accumulation in muscular anatomy of the lower neck is
likely related to movement. No evidence for hypermetabolic cervical
lymphadenopathy.

Incidental CT findings: none

CHEST: 19 mm left lower lobe pulmonary nodule (image 66/4) is
markedly hypermetabolic with SUV max = 12.9. There is no
hypermetabolic lymphadenopathy in the left hilum or mediastinum. No
hypermetabolic lymphadenopathy elsewhere in the chest.

Incidental CT findings: Stent device noted right subclavian region.
Mild atherosclerotic calcification is noted in the wall of the
thoracic aorta. Coronary artery calcification is evident.
Centrilobular emphsyema noted.

ABDOMEN/PELVIS: No abnormal hypermetabolic activity within the
liver, pancreas, adrenal glands, or spleen. No hypermetabolic lymph
nodes in the abdomen or pelvis.

Incidental CT findings: There is advanced atherosclerotic
calcification of the abdominal aorta without aneurysm. Juxta renal
abdominal aorta measures 3.0 cm diameter. Infrarenal abdominal aorta
is dilated up to 3.4 cm diameter. Vascular calcification noted in
the hilum of each kidney. Tiny nonobstructing stones identified
lower pole left kidney. Cystic lesion noted lower pole right kidney
may be a distended calyx or central sinus cyst. Tiny cortical
hypodensity posterior interpolar right kidney is probably a cyst. No
evidence for hydroureter. The urinary bladder appears normal for the
degree of distention.

SKELETON: No focal hypermetabolic activity to suggest skeletal
metastasis.

Incidental CT findings: none
IMPRESSION: 1. Left lower lobe pulmonary nodule is markedly hypermetabolic
consistent with primary bronchogenic neoplasm. No evidence for
hypermetabolic metastatic disease in the neck, chest, abdomen, or
pelvis.
2. Fusiform infrarenal abdominal aortic aneurysm measuring 3.4 cm.
Recommend follow-up ultrasound every 3 years. This recommendation
follows ACR consensus guidelines: White Paper of the ACR Incidental
Findings Committee II on Vascular Findings. [HOSPITAL] 9807;
[DATE].
3. Low-density/cystic focus in the central sinus of the lower pole
right kidney may be a distended calyx, central sinus cyst or
urothelial abnormality. There is no hypermetabolism in this region
suggesting that it represents a central sinus cyst. Attention on
follow-up recommended
4. Emphysema. (W892E-R4Y.R) .
5.  Aortic Atherosclerois (W892E-170.0)
6.

## 2022-04-21 ENCOUNTER — Telehealth: Payer: Self-pay | Admitting: Nurse Practitioner

## 2022-04-21 NOTE — Telephone Encounter (Signed)
Contacted [facility] to schedule their annual wellness visit. Appointment made for 04/29/2022.  Thank you,  [place] Direct dial  [phone]

## 2022-04-29 ENCOUNTER — Ambulatory Visit (INDEPENDENT_AMBULATORY_CARE_PROVIDER_SITE_OTHER): Payer: Medicare Other

## 2022-04-29 ENCOUNTER — Other Ambulatory Visit: Payer: Self-pay

## 2022-04-29 VITALS — BP 110/60 | HR 68 | Temp 98.4°F | Ht 69.0 in | Wt 170.6 lb

## 2022-04-29 DIAGNOSIS — Z1211 Encounter for screening for malignant neoplasm of colon: Secondary | ICD-10-CM

## 2022-04-29 DIAGNOSIS — Z0000 Encounter for general adult medical examination without abnormal findings: Secondary | ICD-10-CM | POA: Diagnosis not present

## 2022-04-29 NOTE — Patient Instructions (Addendum)
[name] [name] , Thank you for taking time to come for your Medicare Wellness Visit. I appreciate your ongoing commitment to your health goals. Please review the following plan we discussed and let me know if I can assist you in the future.   These are the goals we discussed:  Goals       No current goal (pt-stated)        This is a list of the screening recommended for you and due dates:  Health Maintenance  Topic Date Due   DTaP/Tdap/Td vaccine (1 - Tdap) Never done   COVID-19 Vaccine (4 - 2023-24 season) 05/15/2022*   Flu Shot  05/30/2022*   Zoster (Shingles) Vaccine (1 of 2) 07/28/2022*   Pneumonia Vaccine (2 of 2 - PCV) 04/29/2023*   Colon Cancer Screening  04/29/2023*   Medicare Annual Wellness Visit  04/29/2023   Hepatitis C Screening: USPSTF Recommendation to screen - Ages 18-79 yo.  Completed   HPV Vaccine  Aged Out  *Topic was postponed. The date shown is not the original due date.  Opioid Pain Medicine Management Opioids are powerful medicines that are used to treat moderate to severe pain. When used for short periods of time, they can help you to: Sleep better. Do better in physical or occupational therapy. Feel better in the first few days after an injury. Recover from surgery. Opioids should be taken with the supervision of a trained health care provider. They should be taken for the shortest period of time possible. This is because opioids can be addictive, and the longer you take opioids, the greater your risk of addiction. This addiction can also be called opioid use disorder. What are the risks? Using opioid pain medicines for longer than 3 days increases your risk of side effects. Side effects include: Constipation. Nausea and vomiting. Breathing difficulties (respiratory depression). Drowsiness. Confusion. Opioid use disorder. Itching. Taking opioid pain medicine for a long period of time can affect your ability to do daily tasks. It also puts you at risk  for: Motor vehicle crashes. Depression. Suicide. Heart attack. Overdose, which can be life-threatening. What is a pain treatment plan? A pain treatment plan is an agreement between you and your health care provider. Pain is unique to each person, and treatments vary depending on your condition. To manage your pain, you and your health care provider need to work together. To help you do this: Discuss the goals of your treatment, including how much pain you might expect to have and how you will manage the pain. Review the risks and benefits of taking opioid medicines. Remember that a good treatment plan uses more than one approach and minimizes the chance of side effects. Be honest about the amount of medicines you take and about any drug or alcohol use. Get pain medicine prescriptions from only one health care provider. Pain can be managed with many types of alternative treatments. Ask your health care provider to refer you to one or more specialists who can help you manage pain through: Physical or occupational therapy. Counseling (cognitive behavioral therapy). Good nutrition. Biofeedback. Massage. Meditation. Non-opioid medicine. Following a gentle exercise program. How to use opioid pain medicine Taking medicine Take your pain medicine exactly as told by your health care provider. Take it only when you need it. If your pain gets less severe, you may take less than your prescribed dose if your health care provider approves. If you are not having pain, do nottake pain medicine unless your health care provider  tells you to take it. If your pain is severe, do nottry to treat it yourself by taking more pills than instructed on your prescription. Contact your health care provider for help. Write down the times when you take your pain medicine. It is easy to become confused while on pain medicine. Writing the time can help you avoid overdose. Take other over-the-counter or prescription  medicines only as told by your health care provider. Keeping yourself and others safe  While you are taking opioid pain medicine: Do not drive, use machinery, or power tools. Do not sign legal documents. Do not drink alcohol. Do not take sleeping pills. Do not supervise children by yourself. Do not do activities that require climbing or being in high places. Do not go to a lake, river, ocean, spa, or swimming pool. Do not share your pain medicine with anyone. Keep pain medicine in a locked cabinet or in a secure area where pets and children cannot reach it. Stopping your use of opioids If you have been taking opioid medicine for more than a few weeks, you may need to slowly decrease (taper) how much you take until you stop completely. Tapering your use of opioids can decrease your risk of symptoms of withdrawal, such as: Pain and cramping in the abdomen. Nausea. Sweating. Sleepiness. Restlessness. Uncontrollable shaking (tremors). Cravings for the medicine. Do not attempt to taper your use of opioids on your own. Talk with your health care provider about how to do this. Your health care provider may prescribe a step-down schedule based on how much medicine you are taking and how long you have been taking it. Getting rid of leftover pills Do not save any leftover pills. Get rid of leftover pills safely by: Taking the medicine to a prescription take-back program. This is usually offered by the county or law enforcement. Bringing them to a pharmacy that has a drug disposal container. Flushing them down the toilet. Check the label or package insert of your medicine to see whether this is safe to do. Throwing them out in the trash. Check the label or package insert of your medicine to see whether this is safe to do. If it is safe to throw it out, remove the medicine from the original container, put it into a sealable bag or container, and mix it with used coffee grounds, food scraps, dirt, or  cat litter before putting it in the trash. Follow these instructions at home: Activity Do exercises as told by your health care provider. Avoid activities that make your pain worse. Return to your normal activities as told by your health care provider. Ask your health care provider what activities are safe for you. General instructions You may need to take these actions to prevent or treat constipation: Drink enough fluid to keep your urine pale yellow. Take over-the-counter or prescription medicines. Eat foods that are high in fiber, such as beans, whole grains, and fresh fruits and vegetables. Limit foods that are high in fat and processed sugars, such as fried or sweet foods. Keep all follow-up visits. This is important. Where to find support If you have been taking opioids for a long time, you may benefit from receiving support for quitting from a local support group or counselor. Ask your health care provider for a referral to these resources in your area. Where to find more information Centers for Disease Control and Prevention (CDC): http://www.wolf.info/ [place] Food and Drug Administration (FDA): [place] Get help right away if: You may have taken  too much of an opioid (overdosed). Common symptoms of an overdose: Your breathing is slower or more shallow than normal. You have a very slow heartbeat (pulse). You have slurred speech. You have nausea and vomiting. Your pupils become very small. You have other potential symptoms: You are very confused. You faint or feel like you will faint. You have cold, clammy skin. You have blue lips or fingernails. You have thoughts of harming yourself or harming others. These symptoms may represent a serious problem that is an emergency. Do not wait to see if the symptoms will go away. Get medical help right away. Call your local emergency services (911 in the [place] Do not drive yourself to the hospital.  If you ever feel like you may hurt yourself or  others, or have thoughts about taking your own life, get help right away. Go to your nearest emergency department or: Call your local emergency services (911 in the [place] Call the [facility] (847)445-4523 in the [place] Call a suicide crisis helpline, such as the [place] at 765-584-7631 or 988 in the [place]. This is open 24 hours a day in the [place] Text the Crisis Text Line at 703 714 6285 (in the [place].). Summary Opioid medicines can help you manage moderate to severe pain for a short period of time. A pain treatment plan is an agreement between you and your health care provider. Discuss the goals of your treatment, including how much pain you might expect to have and how you will manage the pain. If you think that you or someone else may have taken too much of an opioid, get medical help right away. This information is not intended to replace advice given to you by your health care provider. Make sure you discuss any questions you have with your health care provider. Document Revised: 09/10/2020 Document Reviewed: 05/28/2020 Elsevier Patient Education  [place] directives: Advance directive discussed with you today. Even though you declined this today, please call our office should you change your mind, and we can give you the proper paperwork for you to fill out.   Conditions/risks identified: None  Next appointment: Follow up in one year for your annual wellness visit.    Preventive Care 9 Years and Older, Male  Preventive care refers to lifestyle choices and visits with your health care provider that can promote health and wellness. What does preventive care include? A yearly physical exam. This is also called an annual well check. Dental exams once or twice a year. Routine eye exams. Ask your health care provider how often you should have your eyes checked. Personal lifestyle choices, including: Daily care of your  teeth and gums. Regular physical activity. Eating a healthy diet. Avoiding tobacco and drug use. Limiting alcohol use. Practicing safe sex. Taking low doses of aspirin every day. Taking vitamin and mineral supplements as recommended by your health care provider. What happens during an annual well check? The services and screenings done by your health care provider during your annual well check will depend on your age, overall health, lifestyle risk factors, and family history of disease. Counseling  Your health care provider may ask you questions about your: Alcohol use. Tobacco use. Drug use. Emotional well-being. Home and relationship well-being. Sexual activity. Eating habits. History of falls. Memory and ability to understand (cognition). Work and work Statistician. Screening  You may have the following tests or measurements: Height, weight, and BMI. Blood pressure. Lipid and cholesterol levels.  These may be checked every 5 years, or more frequently if you are over 33 years old. Skin check. Lung cancer screening. You may have this screening every year starting at age 64 if you have a 30-pack-year history of smoking and currently smoke or have quit within the past 15 years. Fecal occult blood test (FOBT) of the stool. You may have this test every year starting at age 48. Flexible sigmoidoscopy or colonoscopy. You may have a sigmoidoscopy every 5 years or a colonoscopy every 10 years starting at age 12. Prostate cancer screening. Recommendations will vary depending on your family history and other risks. Hepatitis C blood test. Hepatitis B blood test. Sexually transmitted disease (STD) testing. Diabetes screening. This is done by checking your blood sugar (glucose) after you have not eaten for a while (fasting). You may have this done every 1-3 years. Abdominal aortic aneurysm (AAA) screening. You may need this if you are a current or former smoker. Osteoporosis. You may be  screened starting at age 36 if you are at high risk. Talk with your health care provider about your test results, treatment options, and if necessary, the need for more tests. Vaccines  Your health care provider may recommend certain vaccines, such as: Influenza vaccine. This is recommended every year. Tetanus, diphtheria, and acellular pertussis (Tdap, Td) vaccine. You may need a Td booster every 10 years. Zoster vaccine. You may need this after age 42. Pneumococcal 13-valent conjugate (PCV13) vaccine. One dose is recommended after age 4. Pneumococcal polysaccharide (PPSV23) vaccine. One dose is recommended after age 19. Talk to your health care provider about which screenings and vaccines you need and how often you need them. This information is not intended to replace advice given to you by your health care provider. Make sure you discuss any questions you have with your health care provider. Document Released: 03/14/2015 Document Revised: 11/05/2015 Document Reviewed: 12/17/2014 Elsevier Interactive Patient Education  2017 [place] Prevention in the Home Falls can cause injuries. They can happen to people of all ages. There are many things you can do to make your home safe and to help prevent falls. What can I do on the outside of my home? Regularly fix the edges of walkways and driveways and fix any cracks. Remove anything that might make you trip as you walk through a door, such as a raised step or threshold. Trim any bushes or trees on the path to your home. Use bright outdoor lighting. Clear any walking paths of anything that might make someone trip, such as rocks or tools. Regularly check to see if handrails are loose or broken. Make sure that both sides of any steps have handrails. Any raised decks and porches should have guardrails on the edges. Have any leaves, snow, or ice cleared regularly. Use sand or salt on walking paths during winter. Clean up any spills in  your garage right away. This includes oil or grease spills. What can I do in the bathroom? Use night lights. Install grab bars by the toilet and in the tub and shower. Do not use towel bars as grab bars. Use non-skid mats or decals in the tub or shower. If you need to sit down in the shower, use a plastic, non-slip stool. Keep the floor dry. Clean up any water that spills on the floor as soon as it happens. Remove soap buildup in the tub or shower regularly. Attach bath mats securely with double-sided non-slip rug tape. Do not have throw  rugs and other things on the floor that can make you trip. What can I do in the bedroom? Use night lights. Make sure that you have a light by your bed that is easy to reach. Do not use any sheets or blankets that are too big for your bed. They should not hang down onto the floor. Have a firm chair that has side arms. You can use this for support while you get dressed. Do not have throw rugs and other things on the floor that can make you trip. What can I do in the kitchen? Clean up any spills right away. Avoid walking on wet floors. Keep items that you use a lot in easy-to-reach places. If you need to reach something above you, use a strong step stool that has a grab bar. Keep electrical cords out of the way. Do not use floor polish or wax that makes floors slippery. If you must use wax, use non-skid floor wax. Do not have throw rugs and other things on the floor that can make you trip. What can I do with my stairs? Do not leave any items on the stairs. Make sure that there are handrails on both sides of the stairs and use them. Fix handrails that are broken or loose. Make sure that handrails are as long as the stairways. Check any carpeting to make sure that it is firmly attached to the stairs. Fix any carpet that is loose or worn. Avoid having throw rugs at the top or bottom of the stairs. If you do have throw rugs, attach them to the floor with carpet  tape. Make sure that you have a light switch at the top of the stairs and the bottom of the stairs. If you do not have them, ask someone to add them for you. What else can I do to help prevent falls? Wear shoes that: Do not have high heels. Have rubber bottoms. Are comfortable and fit you well. Are closed at the toe. Do not wear sandals. If you use a stepladder: Make sure that it is fully opened. Do not climb a closed stepladder. Make sure that both sides of the stepladder are locked into place. Ask someone to hold it for you, if possible. Clearly mark and make sure that you can see: Any grab bars or handrails. First and last steps. Where the edge of each step is. Use tools that help you move around (mobility aids) if they are needed. These include: Canes. Walkers. Scooters. Crutches. Turn on the lights when you go into a dark area. Replace any light bulbs as soon as they burn out. Set up your furniture so you have a clear path. Avoid moving your furniture around. If any of your floors are uneven, fix them. If there are any pets around you, be aware of where they are. Review your medicines with your doctor. Some medicines can make you feel dizzy. This can increase your chance of falling. Ask your doctor what other things that you can do to help prevent falls. This information is not intended to replace advice given to you by your health care provider. Make sure you discuss any questions you have with your health care provider. Document Released: 12/12/2008 Document Revised: 07/24/2015 Document Reviewed: 03/22/2014 Elsevier Interactive Patient Education  2017 [employer].

## 2022-04-29 NOTE — Progress Notes (Signed)
Subjective:   [name] [name] is a 67 y.o. male who presents for Medicare Annual/Subsequent preventive examination.  Review of Systems      Cardiac Risk Factors include: advanced age (>24mn, >>54women);male gender;hypertension     Objective:    Today's Vitals   04/29/22 1123 04/29/22 1129  BP: 110/60   Pulse: 68   Temp: 98.4 F (36.9 C)   TempSrc: Oral   SpO2: 98%   Weight: 170 lb 9.6 oz (77.4 kg)   Height: '5\' 9"'$  (1.753 m)   PainSc:  2    Body mass index is 25.19 kg/m.     04/29/2022   11:51 AM 08/07/2021   11:39 AM 07/16/2021    1:29 PM 03/02/2021    6:12 PM 07/12/2020    1:39 PM 12/12/2019    1:13 PM 02/11/2017   12:13 PM  Advanced Directives  Does Patient Have a Medical Advance Directive? No No No No No No No  Would patient like information on creating a medical advance directive? No - Patient declined  Yes (MAU/Ambulatory/Procedural Areas - Information given) No - Patient declined No - Patient declined  No - Patient declined    Current Medications (verified) Outpatient Encounter Medications as of 04/29/2022  Medication Sig   albuterol (VENTOLIN HFA) 108 (90 Base) MCG/ACT inhaler INHALE 2 PUFFS INTO THE LUNGS EVERY 4 (FOUR) HOURS AS NEEDED FOR WHEEZING OR SHORTNESS OF BREATH (COUGH, SHORTNESS OF BREATH OR WHEEZING.). (Patient taking differently: 2 puffs every 4 (four) hours as needed for wheezing or shortness of breath (cough).)   amLODipine (NORVASC) 10 MG tablet Take 1 tablet (10 mg total) by mouth every morning.   ASPIRIN LOW DOSE 81 MG tablet TAKE 1 TABLET(81 MG) BY MOUTH DAILY. SWALLOW WHOLE   atorvastatin (LIPITOR) 40 MG tablet Take 1 tablet (40 mg total) by mouth daily.   Budeson-Glycopyrrol-Formoterol (BREZTRI AEROSPHERE) 160-9-4.8 MCG/ACT AERO Inhale 2 puffs into the lungs in the morning and at bedtime. (Patient taking differently: Inhale 2 puffs into the lungs every morning.)   budesonide-formoterol (SYMBICORT) 80-4.5 MCG/ACT inhaler INHALE 2 PUFFS INTO THE  LUNGS 2 (TWO) TIMES DAILY. (Patient taking differently: Inhale 2 puffs into the lungs at bedtime.)   clopidogrel (PLAVIX) 75 MG tablet TAKE 1 TABLET(75 MG) BY MOUTH DAILY   docusate sodium (COLACE) 100 MG capsule Take 100 mg by mouth at bedtime.   hydrALAZINE (APRESOLINE) 25 MG tablet TAKE 2 TABLETS (50 MG TOTAL) BY MOUTH EVERY 8 (EIGHT) HOURS.   isosorbide mononitrate (IMDUR) 30 MG 24 hr tablet Take 1 tablet (30 mg total) by mouth every 8 (eight) hours as needed.   levothyroxine (SYNTHROID) 112 MCG tablet Take 1 tablet (112 mcg total) by mouth daily.   metoprolol tartrate (LOPRESSOR) 25 MG tablet Take 1 tablet (25 mg total) by mouth 2 (two) times daily.   mirtazapine (REMERON SOL-TAB) 15 MG disintegrating tablet Take 1 tablet (15 mg total) by mouth at bedtime. (Patient not taking: Reported on 07/14/2021)   nitrofurantoin, macrocrystal-monohydrate, (MACROBID) 100 MG capsule Take 1 capsule (100 mg total) by mouth 2 times daily for 5 days. (Patient not taking: Reported on 11/16/2021)   oxybutynin (DITROPAN) 5 MG tablet Take 1 tablet (5 mg total) by mouth 3 (three)  times daily as needed (bladder spasm/leakage around catheter). (Patient not taking: Reported on 11/16/2021)   oxyCODONE (OXY IR/ROXICODONE) 5 MG immediate release tablet Take 1 tablet (5 mg total) by mouth every 6 (six) hours as needed for moderate pain or  severe pain. (Patient not taking: Reported on 11/16/2021)   Vitamin D, Ergocalciferol, (DRISDOL) 1.25 MG (50000 UNIT) CAPS capsule TAKE 1 CAPSULE BY MOUTH EVERY 7 DAYS.   [DISCONTINUED] amLODipine (NORVASC) 5 MG tablet Take 1 tablet (5 mg total) by mouth daily.   No facility-administered encounter medications on file as of 04/29/2022.    Allergies (verified) Codeine   History: Past Medical History:  Diagnosis Date   Arthritis    Chronic right ear pain 07/2019   Coronary artery disease    Depression 06/12/11   Buproprion per Dr. Lamonte Sakai   Drainage from ear, right 07/2019   Dyspnea     Full dentures    Fitting Per Dr. 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 of Cisplatin and Dhydrtion   Skin rash 01/2019   Smoking 06/12/11   1/2 pack/day   Status post chemotherapy 10/30/10 - 11/09/10    2 doses of Q 3 week Cisplatin   Status post radiation therapy 10/11/10 - 12/24/10   Bilateral Neck and Mucosa axis /  70 gray in 35 fractions   Xerostomia    Past Surgical History:  Procedure Laterality Date   APPENDECTOMY     BIOPSY TONGUE     TonsilandBaseoftongue   BRONCHIAL BRUSHINGS  07/20/2021   Procedure: BRONCHIAL BRUSHINGS;  Surgeon: Collene Gobble, MD;  Location: [facility] ENDOSCOPY;  Service: Pulmonary;;   BRONCHIAL NEEDLE ASPIRATION BIOPSY  07/20/2021   Procedure: BRONCHIAL NEEDLE ASPIRATION BIOPSIES;  Surgeon: Collene Gobble, MD;  Location: [place] ENDOSCOPY;  Service: Pulmonary;;   FIDUCIAL MARKER PLACEMENT  07/20/2021   Procedure: FIDUCIAL DYE MARKING;  Surgeon: Collene Gobble, MD;  Location: [place] ENDOSCOPY;  Service: Pulmonary;;   HERNIA REPAIR     umbilical with mesh   INTERCOSTAL NERVE BLOCK Left 07/20/2021   Procedure: INTERCOSTAL NERVE BLOCK;  Surgeon: [name], MD;  Location: [place];  Service: Thoracic;  Laterality: Left;   IR NEPHROSTOMY EXCHANGE LEFT  01/10/2017   IR NEPHROSTOMY PLACEMENT LEFT  11/15/2016   LEFT HEART CATH AND CORONARY ANGIOGRAPHY N/A 01/18/2017   Procedure: LEFT HEART CATH AND CORONARY ANGIOGRAPHY;  Surgeon: [name], MD;  Location: [place] CV LAB;  Service: Cardiovascular;  Laterality: N/A;   LYMPH NODE BIOPSY Left 07/20/2021   Procedure: LYMPH NODE BIOPSY;  Surgeon: [name], MD;  Location: [facility] OR;  Service: Thoracic;  Laterality: Left;   NEPHROLITHOTOMY Left 02/11/2017   Procedure: NEPHROLITHOTOMY PERCUTANEOUS;  Surgeon: [name], MD;   Location: [facility] ORS;  Service: Urology;  Laterality: Left;   PEG TUBE REMOVAL  04/13/11   PERIPHERAL VASCULAR INTERVENTION Right 03/05/2021   Procedure: PERIPHERAL VASCULAR INTERVENTION;  Surgeon: Serafina Mitchell, MD;  Location: [place] CV LAB;  Service: Cardiovascular;  Laterality: Right;  right subclavian   THROMBECTOMY BRACHIAL ARTERY Right 03/02/2021   Procedure: THROMBECTOMY RIGHT SUBCLAVIAN, BRACHIAL, AXILLA, AND RADIAL ARTERY;  Surgeon: Marty Heck, MD;  Location: [place];  Service: Vascular;  Laterality: Right;   TONSILLECTOMY     right side   UPPER EXTREMITY ANGIOGRAPHY N/A 03/05/2021   Procedure: UPPER EXTREMITY ANGIOGRAPHY;  Surgeon: Serafina Mitchell, MD;  Location: [place] CV LAB;  Service: Cardiovascular;  Laterality: N/A;   VIDEO BRONCHOSCOPY WITH RADIAL ENDOBRONCHIAL  ULTRASOUND  07/20/2021   Procedure: VIDEO BRONCHOSCOPY WITH RADIAL ENDOBRONCHIAL ULTRASOUND;  Surgeon: Collene Gobble, MD;  Location: [facility] ENDOSCOPY;  Service: Pulmonary;;   Family History  Problem Relation Age of Onset   Cancer Father 53       colon   Cancer Sister 25       leukemia   Social History   Socioeconomic History   Marital status: Single    Spouse name: Not on file   Number of children: 3   Years of education: Not on file   Highest education level: Not on file  Occupational History   Not on file  Tobacco Use   Smoking status: Every Day    Packs/day: 0.50    Years: 55.00    Total pack years: 27.50    Types: Cigarettes   Smokeless tobacco: Never   Tobacco comments:    Has not smoked since  07/14/21 ARJ 08/04/21  Vaping Use   Vaping Use: Never used  Substance and Sexual Activity   Alcohol use: No    Alcohol/week: 0.0 standard drinks of alcohol   Drug use: No   Sexual activity: Not Currently  Other Topics Concern   Not on file  Social History Narrative   Not on file   Social Determinants of Health   Financial Resource Strain: Low Risk  (04/29/2022)   Overall Financial Resource  Strain (CARDIA)    Difficulty of Paying Living Expenses: Not hard at all  Food Insecurity: No Food Insecurity (04/29/2022)   Hunger Vital Sign    Worried About Running Out of Food in the Last Year: Never true    Ran Out of Food in the Last Year: Never true  Transportation Needs: No Transportation Needs (04/29/2022)   PRAPARE - Hydrologist (Medical): No    Lack of Transportation (Non-Medical): No  Physical Activity: Inactive (04/29/2022)   Exercise Vital Sign    Days of Exercise per Week: 0 days    Minutes of Exercise per Session: 0 min  Stress: No Stress Concern Present (04/29/2022)   [place]    Feeling of Stress : Not at all  Social Connections: Socially Isolated (04/29/2022)   Social Connection and Isolation Panel [NHANES]    Frequency of Communication with Friends and Family: More than three times a week    Frequency of Social Gatherings with Friends and Family: More than three times a week    Attends Religious Services: Never    Marine scientist or Organizations: No    Attends Archivist Meetings: Never    Marital Status: Widowed    Tobacco Counseling Ready to quit: No Counseling given: Yes Tobacco comments: Has not smoked since  07/14/21 ARJ 08/04/21   Clinical Intake:  Pre-visit preparation completed: No  Pain : 0-10 Pain Score: 2  Pain Location: Abdomen Pain Orientation: Left Pain Descriptors / Indicators: Constant Pain Onset: Other (comment) Pain Frequency: Constant Pain Relieving Factors: Rx Meds Effect of Pain on Daily Activities: Somewhat effect  Pain Relieving Factors: Rx Meds  BMI - recorded: 25.19 Nutritional Status: BMI 25 -29 Overweight Nutritional Risks: None Diabetes: No  How often do you need to have someone help you when you read instructions, pamphlets, or other written materials from your doctor or pharmacy?: 1 - Never  Diabetic?   No  Interpreter Needed?: No  Information entered by :: Geraldine Contras LPN   Activities of Daily  Living    04/29/2022   11:50 AM 07/16/2021    1:34 PM  In your present state of health, do you have any difficulty performing the following activities:  Hearing? 0   Vision? 0   Difficulty concentrating or making decisions? 0   Walking or climbing stairs? 0   Dressing or bathing? 0   Doing errands, shopping? 0 0  Preparing Food and eating ? N   Using the Toilet? N   In the past six months, have you accidently leaked urine? N   Do you have problems with loss of bowel control? N   Managing your Medications? N   Managing your Finances? N   Housekeeping or managing your Housekeeping? N     Patient Care Team: Fenton Foy, NP as PCP - General (Pulmonary Disease) [name], MD as PCP - Cardiology (Cardiology) Melida Quitter, MD as Consulting Physician (Otolaryngology) Eppie Gibson, MD as Consulting Physician (Radiation Oncology)  Indicate any recent Medical Services you may have received from other than [facility] providers in the past year (date may be approximate).     Assessment:   This is a routine wellness examination for Tamiko.  Hearing/Vision screen Hearing Screening - Comments:: Denies hearing difficulties   Vision Screening - Comments:: Wears rx glasses - up to date with routine eye exams with  [facility]  Dietary issues and exercise activities discussed: Current Exercise Habits: The patient does not participate in regular exercise at present, Exercise limited by: None identified   Goals Addressed               This Visit's Progress     No current goal (pt-stated)         Depression Screen    04/29/2022   11:48 AM 11/16/2021    3:42 PM 06/16/2021    1:21 PM 12/11/2020    2:53 PM 08/19/2020    2:36 PM 01/02/2020   10:57 AM 09/08/2018    2:46 PM  PHQ 2/9 Scores  PHQ - 2 Score 0 0 '6 6 2 3 3  '$ PHQ- 9 Score  '9 12 21 9 19 7  '$ Exception Documentation       Medical reason     Fall Risk    04/29/2022   11:51 AM 11/16/2021    3:41 PM 06/16/2021    1:21 PM 12/11/2020    2:53 PM 08/21/2020   10:18 AM  [place] in the past year? 0 0 0 0 0  Number falls in past yr: 0 0 0 0 0  Injury with Fall? 0 0 0 0 0  Risk for fall due to : No Fall Risks No Fall Risks     Follow up Falls prevention discussed Falls evaluation completed Falls evaluation completed      [place]:  Any stairs in or around the home? Yes  If so, are there any without handrails? No  Home free of loose throw rugs in walkways, pet beds, electrical cords, etc? Yes  Adequate lighting in your home to reduce risk of falls? Yes   ASSISTIVE DEVICES UTILIZED TO PREVENT FALLS:  Life alert? No  Use of a cane, walker or w/c? No  Grab bars in the bathroom? Yes  Shower chair or bench in shower? Yes  Elevated toilet seat or a handicapped toilet? No   TIMED UP AND GO:  Was the test performed? Yes .  Length of time to  ambulate 10 feet: 10 sec.   Gait slow and steady without use of assistive device  Cognitive Function:    08/19/2020    2:50 PM  MMSE - Mini Mental State Exam  Orientation to time 5  Orientation to Place 5  Registration 3  Attention/ Calculation 5  Recall 3  Language- name 2 objects 2  Language- repeat 1  Language- follow 3 step command 3  Language- read & follow direction 1  Write a sentence 1  Copy design 1  Total score 30        04/29/2022   11:52 AM  6CIT Screen  What Year? 0 points  What month? 0 points  What time? 0 points  Count back from 20 0 points  Months in reverse 4 points  Repeat phrase 0 points  Total Score 4 points    Immunizations Immunization History  Administered Date(s) Administered   Influenza,inj,Quad PF,6+ Mos 11/16/2016, 01/02/2020   PFIZER(Purple Top)SARS-COV-2 Vaccination 08/23/2019, 09/11/2019, 08/19/2020   Pneumococcal Polysaccharide-23 06/24/2015    TDAP status: Due,  Education has been provided regarding the importance of this vaccine. Advised may receive this vaccine at local pharmacy or Health Dept. Aware to provide a copy of the vaccination record if obtained from local pharmacy or Health Dept. Verbalized acceptance and understanding.  Flu Vaccine status: Declined, Education has been provided regarding the importance of this vaccine but patient still declined. Advised may receive this vaccine at local pharmacy or Health Dept. Aware to provide a copy of the vaccination record if obtained from local pharmacy or Health Dept. Verbalized acceptance and understanding.  Pneumococcal vaccine status: Declined,  Education has been provided regarding the importance of this vaccine but patient still declined. Advised may receive this vaccine at local pharmacy or Health Dept. Aware to provide a copy of the vaccination record if obtained from local pharmacy or Health Dept. Verbalized acceptance and understanding.   Covid-19 vaccine status: Completed vaccines  Qualifies for Shingles Vaccine? Yes   Zostavax completed No   Shingrix Completed?: No.    Education has been provided regarding the importance of this vaccine. Patient has been advised to call insurance company to determine out of pocket expense if they have not yet received this vaccine. Advised may also receive vaccine at local pharmacy or Health Dept. Verbalized acceptance and understanding.  Screening Tests Health Maintenance  Topic Date Due   DTaP/Tdap/Td (1 - Tdap) Never done   COVID-19 Vaccine (4 - 2023-24 season) 05/15/2022 (Originally 10/30/2021)   INFLUENZA VACCINE  05/30/2022 (Originally 09/29/2021)   Zoster Vaccines- Shingrix (1 of 2) 07/28/2022 (Originally [birth year]/24/1973)   Pneumonia Vaccine 74+ Years old (2 of 2 - PCV) 04/29/2023 (Originally 06/23/2016)   COLONOSCOPY (Pts 45-21yr Insurance coverage will need to be confirmed)  04/29/2023 (Originally 12/25/1999)   Medicare Annual Wellness (AWV)  04/29/2023    Hepatitis C Screening  Completed   HPV VACCINES  Aged Out    Health Maintenance  Health Maintenance Due  Topic Date Due   DTaP/Tdap/Td (1 - Tdap) Never done    Colorectal cancer screening: Referral to GI placed 04/29/22. Pt aware the office will call re: appt.  Lung Cancer Screening: (Low Dose CT Chest recommended if Age 52107-80years, 30 pack-year currently smoking OR have quit w/in 15years.) does qualify.   Lung Cancer Screening Referral: Patient deferred  Additional Screening:  Hepatitis C Screening: does qualify; Completed 07/18/15  Vision Screening: Recommended annual ophthalmology exams for early detection of glaucoma and other disorders of  the eye. Is the patient up to date with their annual eye exam?  Yes  Who is the provider or what is the name of the office in which the patient attends annual eye exams? [facility] If pt is not established with a provider, would they like to be referred to a provider to establish care? No .   Dental Screening: Recommended annual dental exams for proper oral hygiene  Community Resource Referral / Chronic Care Management:  CRR required this visit?  No   CCM required this visit?  No      Plan:     I have personally reviewed and noted the following in the patient's chart:   Medical and social history Use of alcohol, tobacco or illicit drugs  Current medications and supplements including opioid prescriptions. Patient is currently taking opioid prescriptions. Information provided to patient regarding non-opioid alternatives. Patient advised to discuss non-opioid treatment plan with their provider. Functional ability and status Nutritional status Physical activity Advanced directives List of other physicians Hospitalizations, surgeries, and ER visits in previous 12 months Vitals Screenings to include cognitive, depression, and falls Referrals and appointments  In addition, I have reviewed and discussed with patient certain  preventive protocols, quality metrics, and best practice recommendations. A written personalized care plan for preventive services as well as general preventive health recommendations were provided to patient.     Criselda Peaches, LPN   [id number]   Nurse Notes:Patient request f/u with concerns of constant pain in lower left abdomen.

## 2022-04-30 ENCOUNTER — Ambulatory Visit (INDEPENDENT_AMBULATORY_CARE_PROVIDER_SITE_OTHER): Payer: Medicare Other | Admitting: Nurse Practitioner

## 2022-04-30 VITALS — BP 115/54 | HR 61 | Temp 97.2°F | Wt 169.4 lb

## 2022-04-30 DIAGNOSIS — I10 Essential (primary) hypertension: Secondary | ICD-10-CM

## 2022-04-30 DIAGNOSIS — E039 Hypothyroidism, unspecified: Secondary | ICD-10-CM | POA: Diagnosis not present

## 2022-04-30 DIAGNOSIS — R1030 Lower abdominal pain, unspecified: Secondary | ICD-10-CM | POA: Diagnosis not present

## 2022-04-30 LAB — POCT URINALYSIS DIP (PROADVANTAGE DEVICE)
Bilirubin, UA: NEGATIVE
Blood, UA: NEGATIVE
Glucose, UA: NEGATIVE mg/dL
Ketones, POC UA: NEGATIVE mg/dL
Leukocytes, UA: NEGATIVE
Nitrite, UA: NEGATIVE
Protein Ur, POC: 30 mg/dL — AB
Specific Gravity, Urine: >=1.03
Urobilinogen, Ur: 0.2
pH, UA: 5.5 (ref 5.0–8.0)

## 2022-04-30 MED ORDER — DOXYCYCLINE HYCLATE 100 MG PO TABS: 100.0000 mg | ORAL_TABLET | Freq: Two times a day (BID) | ORAL | 0 refills | Status: AC

## 2022-04-30 NOTE — Progress Notes (Signed)
Doxycycline   '@Patient'$  ID: [name] [name], male    DOB: [birth year], 67 y.o.   MRN: [id number]  Chief Complaint  Patient presents with   Abdominal Pain    Lower left     Referring provider: Fenton Foy, NP   HPI   [name] [name] 67 y.o. 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 Status post left lower lobectomy.   Patient presents today for an acute visit.  He states that he is having lower abdominal pain.  He states that this started after he had a UroLift procedure and July 2023.  We discussed that he does need to follow with surgeon.  Will check routine labs today as well. Denies f/c/s, n/v/d, hemoptysis, PND, leg swelling Denies chest pain or edema     Allergies  Allergen Reactions   Codeine Nausea Only    Immunization History  Administered Date(s) Administered   Influenza,inj,Quad PF,6+ Mos 11/16/2016, 01/02/2020   PFIZER(Purple Top)SARS-COV-2 Vaccination 08/23/2019, 09/11/2019, 08/19/2020   Pneumococcal Polysaccharide-23 06/24/2015    Past Medical History:  Diagnosis Date   Arthritis    Chronic right ear pain 07/2019   Coronary artery disease    Depression 06/12/11   Buproprion per Dr. Lamonte Sakai   Drainage from ear, right 07/2019   Dyspnea    Full dentures    Fitting Per Dr. 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  of Cisplatin and Dhydrtion   Skin rash 01/2019   Smoking 06/12/11   1/2 pack/day   Status post chemotherapy 10/30/10 - 11/09/10    2 doses of Q 3 week Cisplatin   Status post radiation therapy 10/11/10 - 12/24/10   Bilateral Neck and Mucosa axis /  70 gray in 35 fractions   Xerostomia     Tobacco History: Social History   Tobacco Use  Smoking Status Every Day   Packs/day: 0.50   Years: 55.00   Total pack years: 27.50   Types: Cigarettes  Smokeless Tobacco Never  Tobacco Comments   Has not smoked since  07/14/21 ARJ 08/04/21   Ready to quit: Not Answered Counseling given: Not Answered Tobacco comments: Has not smoked since  07/14/21 ARJ 08/04/21   Outpatient Encounter Medications as of 04/30/2022  Medication Sig   albuterol (VENTOLIN HFA) 108 (90 Base) MCG/ACT inhaler INHALE 2 PUFFS INTO THE LUNGS EVERY 4 (FOUR) HOURS AS NEEDED FOR WHEEZING OR SHORTNESS OF BREATH (COUGH, SHORTNESS OF BREATH OR WHEEZING.). (Patient taking differently: 2 puffs every 4 (four) hours as needed for wheezing or shortness of breath (cough).)   amLODipine (NORVASC) 10 MG tablet Take 1 tablet (10 mg total) by mouth every morning.   ASPIRIN LOW DOSE 81 MG tablet TAKE 1 TABLET(81 MG) BY MOUTH DAILY. SWALLOW WHOLE   atorvastatin (LIPITOR) 40 MG tablet Take 1 tablet (40 mg total) by mouth daily.   Budeson-Glycopyrrol-Formoterol (BREZTRI AEROSPHERE) 160-9-4.8 MCG/ACT AERO Inhale 2 puffs into the  lungs in the morning and at bedtime. (Patient taking differently: Inhale 2 puffs into the lungs every morning.)   budesonide-formoterol (SYMBICORT) 80-4.5 MCG/ACT inhaler INHALE 2 PUFFS INTO THE LUNGS 2 (TWO) TIMES DAILY. (Patient taking differently: Inhale 2 puffs into the lungs at bedtime.)   clopidogrel (PLAVIX) 75 MG tablet TAKE 1 TABLET(75 MG) BY MOUTH DAILY   docusate sodium (COLACE) 100 MG capsule Take 100 mg by mouth at bedtime.   [EXPIRED] doxycycline (VIBRA-TABS) 100 MG tablet Take 1 tablet (100 mg total) by mouth 2  (two) times daily for 10 days.   hydrALAZINE (APRESOLINE) 25 MG tablet TAKE 2 TABLETS (50 MG TOTAL) BY MOUTH EVERY 8 (EIGHT) HOURS.   isosorbide mononitrate (IMDUR) 30 MG 24 hr tablet Take 1 tablet (30 mg total) by mouth every 8 (eight) hours as needed.   levothyroxine (SYNTHROID) 112 MCG tablet Take 1 tablet (112 mcg total) by mouth daily.   metoprolol tartrate (LOPRESSOR) 25 MG tablet Take 1 tablet (25 mg total) by mouth 2 (two) times daily.   Vitamin D, Ergocalciferol, (DRISDOL) 1.25 MG (50000 UNIT) CAPS capsule TAKE 1 CAPSULE BY MOUTH EVERY 7 DAYS.   mirtazapine (REMERON SOL-TAB) 15 MG disintegrating tablet Take 1 tablet (15 mg total) by mouth at bedtime. (Patient not taking: Reported on 07/14/2021)   nitrofurantoin, macrocrystal-monohydrate, (MACROBID) 100 MG capsule Take 1 capsule (100 mg total) by mouth 2 times daily for 5 days. (Patient not taking: Reported on 11/16/2021)   oxybutynin (DITROPAN) 5 MG tablet Take 1 tablet (5 mg total) by mouth 3 (three)  times daily as needed (bladder spasm/leakage around catheter). (Patient not taking: Reported on 11/16/2021)   oxyCODONE (OXY IR/ROXICODONE) 5 MG immediate release tablet Take 1 tablet (5 mg total) by mouth every 6 (six) hours as needed for moderate pain or severe pain. (Patient not taking: Reported on 11/16/2021)   No facility-administered encounter medications on file as of 04/30/2022.     Review of Systems  Review of Systems     Physical Exam  BP (!) 115/54   Pulse 61   Temp (!) 97.2 F (36.2 C)   Wt 169 lb 6.4 oz (76.8 kg)   SpO2 99%   BMI 25.02 kg/m   Wt Readings from Last 5 Encounters:  04/30/22 169 lb 6.4 oz (76.8 kg)  04/29/22 170 lb 9.6 oz (77.4 kg)  11/16/21 168 lb 3.2 oz (76.3 kg)  08/04/21 164 lb 6.4 oz (74.6 kg)  07/31/21 164 lb (74.4 kg)     Physical Exam   Lab Results:  CBC    Component Value Date/Time   WBC 10.1 04/30/2022 1606   WBC 10.1 08/07/2021 1219   RBC 4.88 04/30/2022 1606   RBC 4.30  08/07/2021 1219   HGB 9.8 (L) 04/30/2022 1606   HGB 12.9 (L) 05/17/2012 1100   HCT 34.1 (L) 04/30/2022 1606   HCT 38.2 (L) 05/17/2012 1100   PLT 256 04/30/2022 1606   MCV 70 (L) 04/30/2022 1606   MCV 85.8 05/17/2012 1100   MCH 20.1 (L) 04/30/2022 1606   MCH 20.9 (L) 08/07/2021 1219   MCHC 28.7 (L) 04/30/2022 1606   MCHC 28.8 (L) 08/07/2021 1219   RDW 18.3 (H) 04/30/2022 1606   RDW 16.0 (H) 05/17/2012 1100   LYMPHSABS 0.5 (L) 08/07/2021 1219   LYMPHSABS 1.0 06/16/2021 1400   LYMPHSABS 0.9 05/17/2012 1100   MONOABS 0.6 08/07/2021 1219   MONOABS 0.4 05/17/2012 1100   EOSABS 0.3 08/07/2021 1219   EOSABS 0.2  06/16/2021 1400   BASOSABS 0.0 08/07/2021 1219   BASOSABS 0.1 06/16/2021 1400   BASOSABS 0.0 05/17/2012 1100    BMET    Component Value Date/Time   NA 143 04/30/2022 1606   NA 142 05/17/2012 1100   K 4.0 04/30/2022 1606   K 4.2 05/17/2012 1100   CL 108 (H) 04/30/2022 1606   CL 110 (H) 05/17/2012 1100   CO2 20 04/30/2022 1606   CO2 23 05/17/2012 1100   GLUCOSE 93 04/30/2022 1606   GLUCOSE 103 (H) 08/07/2021 1219   GLUCOSE 113 (H) 05/17/2012 1100   BUN 16 04/30/2022 1606   BUN 16.9 12/14/2012 1446   CREATININE 1.58 (H) 04/30/2022 1606   CREATININE 1.51 (H) 01/03/2017 1108   CREATININE 1.6 (H) 12/14/2012 1446   CALCIUM 8.6 04/30/2022 1606   CALCIUM 9.1 05/17/2012 1100   GFRNONAA 55 (L) 08/07/2021 1219   GFRNONAA 49 (L) 01/03/2017 1108   GFRAA 53 (L) 03/10/2018 1501   GFRAA 57 (L) 01/03/2017 1108    BNP No results found for: "BNP"  ProBNP No results found for: "PROBNP"  Imaging: No results found.   Assessment & Plan:   Essential hypertension - CBC - Comprehensive metabolic panel  2. Hypothyroidism, unspecified type  - Thyroid Panel With TSH  3. Pain radiating to lower abdomen  - POCT Urinalysis DIP (Proadvantage Device) - doxycycline (VIBRA-TABS) 100 MG tablet; Take 1 tablet (100 mg total) by mouth 2 (two) times daily for 10 days.  Dispense: 20  tablet; Refill: 0   Follow up:  Follow up in 3 months     Fenton Foy, NP 05/12/2022

## 2022-04-30 NOTE — Patient Instructions (Signed)
1. Essential hypertension  - CBC - Comprehensive metabolic panel  2. Hypothyroidism, unspecified type  - Thyroid Panel With TSH  3. Pain radiating to lower abdomen  - POCT Urinalysis DIP (Proadvantage Device) - doxycycline (VIBRA-TABS) 100 MG tablet; Take 1 tablet (100 mg total) by mouth 2 (two) times daily for 10 days.  Dispense: 20 tablet; Refill: 0   Follow up:  Follow up in 3 months

## 2022-05-01 LAB — CBC
Hematocrit: 34.1 % — ABNORMAL LOW (ref 37.5–51.0)
Hemoglobin: 9.8 g/dL — ABNORMAL LOW (ref 13.0–17.7)
MCH: 20.1 pg — ABNORMAL LOW (ref 26.6–33.0)
MCHC: 28.7 g/dL — ABNORMAL LOW (ref 31.5–35.7)
MCV: 70 fL — ABNORMAL LOW (ref 79–97)
Platelets: 256 10*3/uL (ref 150–450)
RBC: 4.88 x10E6/uL (ref 4.14–5.80)
RDW: 18.3 % — ABNORMAL HIGH (ref 11.6–15.4)
WBC: 10.1 10*3/uL (ref 3.4–10.8)

## 2022-05-01 LAB — COMPREHENSIVE METABOLIC PANEL
ALT: 10 IU/L (ref 0–44)
AST: 11 IU/L (ref 0–40)
Albumin/Globulin Ratio: 1.6 (ref 1.2–2.2)
Albumin: 3.8 g/dL — ABNORMAL LOW (ref 3.9–4.9)
Alkaline Phosphatase: 102 IU/L (ref 44–121)
BUN/Creatinine Ratio: 10 (ref 10–24)
BUN: 16 mg/dL (ref 8–27)
Bilirubin Total: 0.2 mg/dL (ref 0.0–1.2)
CO2: 20 mmol/L (ref 20–29)
Calcium: 8.6 mg/dL (ref 8.6–10.2)
Chloride: 108 mmol/L — ABNORMAL HIGH (ref 96–106)
Creatinine, Ser: 1.58 mg/dL — ABNORMAL HIGH (ref 0.76–1.27)
Globulin, Total: 2.4 g/dL (ref 1.5–4.5)
Glucose: 93 mg/dL (ref 70–99)
Potassium: 4 mmol/L (ref 3.5–5.2)
Sodium: 143 mmol/L (ref 134–144)
Total Protein: 6.2 g/dL (ref 6.0–8.5)
eGFR: 48 mL/min/{1.73_m2} — ABNORMAL LOW (ref >59)

## 2022-05-01 LAB — THYROID PANEL WITH TSH
Free Thyroxine Index: 2.6 (ref 1.2–4.9)
T3 Uptake Ratio: 33 % (ref 24–39)
T4, Total: 7.8 ug/dL (ref 4.5–12.0)
TSH: 2.93 u[IU]/mL (ref 0.450–4.500)

## 2022-05-12 ENCOUNTER — Encounter: Payer: Self-pay | Admitting: Nurse Practitioner

## 2022-05-12 NOTE — Assessment & Plan Note (Signed)
-   CBC - Comprehensive metabolic panel  2. Hypothyroidism, unspecified type  - Thyroid Panel With TSH  3. Pain radiating to lower abdomen  - POCT Urinalysis DIP (Proadvantage Device) - doxycycline (VIBRA-TABS) 100 MG tablet; Take 1 tablet (100 mg total) by mouth 2 (two) times daily for 10 days.  Dispense: 20 tablet; Refill: 0   Follow up:  Follow up in 3 months

## 2022-05-17 ENCOUNTER — Ambulatory Visit: Payer: Self-pay | Admitting: Nurse Practitioner

## 2022-05-24 ENCOUNTER — Other Ambulatory Visit: Payer: Self-pay | Admitting: Internal Medicine

## 2022-05-24 ENCOUNTER — Other Ambulatory Visit: Payer: Self-pay

## 2022-05-24 DIAGNOSIS — I10 Essential (primary) hypertension: Secondary | ICD-10-CM

## 2022-05-25 ENCOUNTER — Other Ambulatory Visit: Payer: Self-pay

## 2022-05-26 ENCOUNTER — Other Ambulatory Visit: Payer: Self-pay

## 2022-05-26 MED ORDER — HYDRALAZINE HCL 25 MG PO TABS
50.0000 mg | ORAL_TABLET | Freq: Three times a day (TID) | ORAL | 3 refills | Status: DC
  Filled 2022-05-26: qty 180, 30d supply, fill #0
  Filled 2022-06-21: qty 180, 30d supply, fill #1
  Filled 2022-08-16 (×2): qty 180, 30d supply, fill #2
  Filled 2022-09-16: qty 180, 30d supply, fill #3

## 2022-05-27 ENCOUNTER — Other Ambulatory Visit: Payer: Self-pay

## 2022-06-08 ENCOUNTER — Other Ambulatory Visit: Payer: Self-pay

## 2022-06-10 ENCOUNTER — Other Ambulatory Visit: Payer: Self-pay

## 2022-06-11 DIAGNOSIS — Z902 Acquired absence of lung [part of]: Secondary | ICD-10-CM | POA: Diagnosis not present

## 2022-06-21 ENCOUNTER — Other Ambulatory Visit: Payer: Self-pay

## 2022-06-25 ENCOUNTER — Other Ambulatory Visit: Payer: Self-pay

## 2022-07-11 DIAGNOSIS — Z902 Acquired absence of lung [part of]: Secondary | ICD-10-CM | POA: Diagnosis not present

## 2022-08-11 DIAGNOSIS — Z902 Acquired absence of lung [part of]: Secondary | ICD-10-CM | POA: Diagnosis not present

## 2022-08-16 ENCOUNTER — Other Ambulatory Visit: Payer: Self-pay | Admitting: Nurse Practitioner

## 2022-08-16 ENCOUNTER — Other Ambulatory Visit: Payer: Self-pay

## 2022-08-16 ENCOUNTER — Other Ambulatory Visit: Payer: Self-pay | Admitting: Thoracic Surgery (Cardiothoracic Vascular Surgery)

## 2022-08-16 DIAGNOSIS — R0602 Shortness of breath: Secondary | ICD-10-CM

## 2022-08-16 DIAGNOSIS — E559 Vitamin D deficiency, unspecified: Secondary | ICD-10-CM

## 2022-08-16 DIAGNOSIS — I10 Essential (primary) hypertension: Secondary | ICD-10-CM

## 2022-08-16 MED ORDER — VITAMIN D (ERGOCALCIFEROL) 1.25 MG (50000 UNIT) PO CAPS
ORAL_CAPSULE | ORAL | 3 refills | Status: DC
  Filled 2022-08-16: qty 4, 28d supply, fill #0
  Filled 2022-09-09: qty 4, 28d supply, fill #1
  Filled 2022-10-13: qty 4, 28d supply, fill #2
  Filled 2022-11-09: qty 4, 28d supply, fill #3

## 2022-08-16 MED ORDER — ATORVASTATIN CALCIUM 40 MG PO TABS
40.0000 mg | ORAL_TABLET | Freq: Every day | ORAL | 0 refills | Status: DC
  Filled 2022-08-16: qty 90, 90d supply, fill #0

## 2022-08-16 NOTE — Telephone Encounter (Signed)
Please advise KH 

## 2022-08-17 ENCOUNTER — Other Ambulatory Visit: Payer: Self-pay

## 2022-08-17 NOTE — Telephone Encounter (Signed)
Please advise KH 

## 2022-08-18 ENCOUNTER — Other Ambulatory Visit: Payer: Self-pay

## 2022-08-18 MED ORDER — ALBUTEROL SULFATE HFA 108 (90 BASE) MCG/ACT IN AERS
2.0000 | INHALATION_SPRAY | RESPIRATORY_TRACT | 11 refills | Status: DC | PRN
  Filled 2022-08-18: qty 8.5, 17d supply, fill #0

## 2022-08-18 MED ORDER — BUDESONIDE-FORMOTEROL FUMARATE 80-4.5 MCG/ACT IN AERO
2.0000 | INHALATION_SPRAY | Freq: Two times a day (BID) | RESPIRATORY_TRACT | 11 refills | Status: DC
  Filled 2022-08-18: qty 10.2, 30d supply, fill #0

## 2022-08-25 ENCOUNTER — Other Ambulatory Visit: Payer: Self-pay

## 2022-08-27 ENCOUNTER — Other Ambulatory Visit: Payer: Self-pay

## 2022-09-10 DIAGNOSIS — Z902 Acquired absence of lung [part of]: Secondary | ICD-10-CM | POA: Diagnosis not present

## 2022-09-14 ENCOUNTER — Other Ambulatory Visit: Payer: Self-pay

## 2022-09-14 ENCOUNTER — Other Ambulatory Visit: Payer: Self-pay | Admitting: *Deleted

## 2022-09-14 DIAGNOSIS — I998 Other disorder of circulatory system: Secondary | ICD-10-CM

## 2022-09-14 DIAGNOSIS — T81718A Complication of other artery following a procedure, not elsewhere classified, initial encounter: Secondary | ICD-10-CM

## 2022-09-17 ENCOUNTER — Other Ambulatory Visit: Payer: Self-pay

## 2022-10-01 ENCOUNTER — Other Ambulatory Visit (HOSPITAL_COMMUNITY): Payer: Self-pay

## 2022-10-04 ENCOUNTER — Telehealth: Payer: Self-pay | Admitting: Vascular Surgery

## 2022-10-04 NOTE — Telephone Encounter (Signed)
lvm to r/s appt

## 2022-10-05 ENCOUNTER — Ambulatory Visit: Payer: Medicare Other

## 2022-10-05 ENCOUNTER — Ambulatory Visit (HOSPITAL_COMMUNITY): Payer: Medicare Other

## 2022-10-11 DIAGNOSIS — Z902 Acquired absence of lung [part of]: Secondary | ICD-10-CM | POA: Diagnosis not present

## 2022-10-13 ENCOUNTER — Other Ambulatory Visit: Payer: Self-pay

## 2022-10-13 ENCOUNTER — Other Ambulatory Visit: Payer: Self-pay | Admitting: Nurse Practitioner

## 2022-10-13 DIAGNOSIS — I10 Essential (primary) hypertension: Secondary | ICD-10-CM

## 2022-10-13 MED ORDER — HYDRALAZINE HCL 25 MG PO TABS
50.0000 mg | ORAL_TABLET | Freq: Three times a day (TID) | ORAL | 3 refills | Status: DC
  Filled 2022-10-13: qty 180, 30d supply, fill #0

## 2022-10-13 NOTE — Telephone Encounter (Signed)
Please advise Kh 

## 2022-10-14 ENCOUNTER — Other Ambulatory Visit: Payer: Self-pay

## 2022-11-03 ENCOUNTER — Encounter: Payer: Self-pay | Admitting: Nurse Practitioner

## 2022-11-03 ENCOUNTER — Ambulatory Visit (INDEPENDENT_AMBULATORY_CARE_PROVIDER_SITE_OTHER): Payer: Medicare PPO | Admitting: Nurse Practitioner

## 2022-11-03 VITALS — BP 115/53 | HR 58 | Temp 98.7°F | Resp 16 | Ht 69.0 in | Wt 159.4 lb

## 2022-11-03 DIAGNOSIS — R42 Dizziness and giddiness: Secondary | ICD-10-CM | POA: Diagnosis not present

## 2022-11-03 DIAGNOSIS — R001 Bradycardia, unspecified: Secondary | ICD-10-CM

## 2022-11-03 DIAGNOSIS — E039 Hypothyroidism, unspecified: Secondary | ICD-10-CM

## 2022-11-03 DIAGNOSIS — G8929 Other chronic pain: Secondary | ICD-10-CM | POA: Diagnosis not present

## 2022-11-03 DIAGNOSIS — R413 Other amnesia: Secondary | ICD-10-CM

## 2022-11-03 DIAGNOSIS — R519 Headache, unspecified: Secondary | ICD-10-CM | POA: Diagnosis not present

## 2022-11-03 DIAGNOSIS — R109 Unspecified abdominal pain: Secondary | ICD-10-CM | POA: Diagnosis not present

## 2022-11-03 NOTE — Progress Notes (Signed)
@Patient  ID: [name] [name], male    DOB: 1955-07-23, 67 y.o.   MRN: [id number]  Chief Complaint  Patient presents with   Follow-up    Referring provider: Ivonne Andrew, NP  HPI  [name] [name] 67 y.o. male  has a past medical history of Arthritis, Chronic right ear pain (07/2019), Coronary artery disease, Depression (06/12/11), Drainage from ear, right (07/2019), Dyspnea, Full dentures, Heart murmur, History of kidney stones, Hypertension, Hypothyroidism, Oropharyngeal cancer ([facility]) (2012), Protein calorie malnutrition ([facility]) (05/31/11), Renal insufficiency (2012), Skin rash (01/2019), Smoking (06/12/11), Status post chemotherapy (10/30/10 - 11/09/10), Status post radiation therapy (10/11/10 - 12/24/10), and Xerostomia. Status post left lower lobectomy.   Hypothyroidism  [name] [name] is a 67 y.o. male who presents for follow up of hypothyroidism. Current symptoms: none. Patient denies diarrhea, heat / cold intolerance, nervousness, palpitations, and weight changes. Symptoms have stabilized.  Hypertension  Patient presents today for a follow-up on hypertension.  He states that he has not followed up with cardiology in the past couple years.  He has been having issues with dizziness.  We will place a referral for physical therapy will also place referral back to cardiology.  It was noted that blood pressure and heart rate was low in the office today.  We discussed that patient May take 25 mg apresoline every 8 hours (instead of 50 mg) - daughter will check blood pressure. Patient previously seeing Dr. Tresa Endo  Patient also complains today of right side headaches and memory loss.  Patient is also having dizziness.  We will place a referral for neurology.  Patient also complains today of the left side abdominal pain.  He states that this has been an issue for several months.  Patient has never had a colonoscopy.  We will place a referral to GI.  Patient denies any blood in stools.  No constipation or  diarrhea.     Denies f/c/s, n/v/d, hemoptysis, PND, leg swelling Denies chest pain or edema   Allergies  Allergen Reactions   Codeine Nausea Only    Immunization History  Administered Date(s) Administered   Influenza,inj,Quad PF,6+ Mos 11/16/2016, 01/02/2020   PFIZER(Purple Top)SARS-COV-2 Vaccination 08/23/2019, 09/11/2019, 08/19/2020   Pneumococcal Polysaccharide-23 06/24/2015    Past Medical History:  Diagnosis Date   Arthritis    Chronic right ear pain 07/2019   Coronary artery disease    Depression 06/12/11   Buproprion per Dr. [name]   Drainage from ear, right 07/2019   Dyspnea    Full dentures    Fitting Per Dr. 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 of Cisplatin and Dhydrtion   Skin rash 01/2019   Smoking 06/12/11   1/2 pack/day   Status post chemotherapy 10/30/10 - 11/09/10    2 doses of Q 3 week Cisplatin   Status post radiation therapy 10/11/10 - 12/24/10   Bilateral Neck and Mucosa axis /  70 gray in 35 fractions   Xerostomia     Tobacco History: Social History   Tobacco Use  Smoking Status Every Day   Current packs/day: 0.50   Average packs/day: 0.5 packs/day for 55.0 years (27.5 ttl pk-yrs)   Types: Cigarettes  Smokeless Tobacco Never  Tobacco Comments   Has not smoked since  07/14/21  ARJ 08/04/21   Ready to quit: Not Answered Counseling given: Not Answered Tobacco comments: Has not smoked since  07/14/21 ARJ 08/04/21   Outpatient Encounter Medications as of 11/03/2022  Medication Sig   albuterol (VENTOLIN HFA) 108 (90 Base) MCG/ACT inhaler INHALE 2 PUFFS INTO THE LUNGS EVERY 4 (FOUR) HOURS AS NEEDED FOR WHEEZING OR SHORTNESS OF BREATH (COUGH, SHORTNESS OF BREATH OR WHEEZING.).   amLODipine (NORVASC) 10 MG tablet Take 1 tablet (10 mg total) by mouth every morning.    ASPIRIN LOW DOSE 81 MG tablet TAKE 1 TABLET(81 MG) BY MOUTH DAILY. SWALLOW WHOLE   atorvastatin (LIPITOR) 40 MG tablet Take 1 tablet (40 mg total) by mouth daily.   Budeson-Glycopyrrol-Formoterol (BREZTRI AEROSPHERE) 160-9-4.8 MCG/ACT AERO Inhale 2 puffs into the lungs in the morning and at bedtime. (Patient taking differently: Inhale 2 puffs into the lungs every morning.)   budesonide-formoterol (SYMBICORT) 80-4.5 MCG/ACT inhaler INHALE 2 PUFFS INTO THE LUNGS 2 (TWO) TIMES DAILY.   clopidogrel (PLAVIX) 75 MG tablet TAKE 1 TABLET(75 MG) BY MOUTH DAILY   docusate sodium (COLACE) 100 MG capsule Take 100 mg by mouth at bedtime.   hydrALAZINE (APRESOLINE) 25 MG tablet Take 2 tablets (50 mg total) by mouth every 8 (eight) hours.   isosorbide mononitrate (IMDUR) 30 MG 24 hr tablet Take 1 tablet (30 mg total) by mouth every 8 (eight) hours as needed.   levothyroxine (SYNTHROID) 112 MCG tablet Take 1 tablet (112 mcg total) by mouth daily.   metoprolol tartrate (LOPRESSOR) 25 MG tablet Take 1 tablet (25 mg total) by mouth 2 (two) times daily.   nitrofurantoin, macrocrystal-monohydrate, (MACROBID) 100 MG capsule Take 1 capsule (100 mg total) by mouth 2 times daily for 5 days.   oxybutynin (DITROPAN) 5 MG tablet Take 1 tablet (5 mg total) by mouth 3 (three)  times daily as needed (bladder spasm/leakage around catheter).   oxyCODONE (OXY IR/ROXICODONE) 5 MG immediate release tablet Take 1 tablet (5 mg total) by mouth every 6 (six) hours as needed for moderate pain or severe pain.   Vitamin D, Ergocalciferol, (DRISDOL) 1.25 MG (50000 UNIT) CAPS capsule TAKE 1 CAPSULE BY MOUTH EVERY 7 DAYS.   [DISCONTINUED] mirtazapine (REMERON SOL-TAB) 15 MG disintegrating tablet Take 1 tablet (15 mg total) by mouth at bedtime. (Patient not taking: Reported on 07/14/2021)   No facility-administered encounter medications on file as of 11/03/2022.     Review of Systems  Review of Systems  Constitutional: Negative.   HENT:  Negative.    Cardiovascular: Negative.   Gastrointestinal: Negative.   Allergic/Immunologic: Negative.   Neurological:  Positive for dizziness and light-headedness.  Psychiatric/Behavioral: Negative.         Physical Exam  BP (!) 115/53 (BP Location: Left Arm, Patient Position: Sitting, Cuff Size: Normal)   Pulse (!) 58   Temp 98.7 F (37.1 C)   Resp 16   Ht 5\' 9"  (1.753 m)   Wt 159 lb 6.4 oz (72.3 kg)   SpO2 98%   BMI 23.54 kg/m   Wt Readings from Last 5 Encounters:  11/03/22 159 lb 6.4 oz (72.3 kg)  04/30/22 169 lb 6.4 oz (76.8 kg)  04/29/22 170 lb 9.6 oz (77.4 kg)  11/16/21 168 lb 3.2 oz (76.3 kg)  08/04/21 164 lb 6.4 oz (74.6 kg)     Physical Exam Vitals and nursing note reviewed.  Constitutional:      General: He is not in acute distress.    Appearance: He is well-developed.  Cardiovascular:     Rate and Rhythm: Normal rate and regular rhythm.  Pulmonary:     Effort: Pulmonary effort is normal.     Breath sounds: Normal breath sounds.  Skin:    General: Skin is warm and dry.  Neurological:     Mental Status: He is alert and oriented to person, place, and time.  Psychiatric:        Mood and Affect: Mood normal.        Behavior: Behavior normal.      Lab Results:  CBC    Component Value Date/Time   WBC 10.1 04/30/2022 1606   WBC 10.1 08/07/2021 1219   RBC 4.88 04/30/2022 1606   RBC 4.30 08/07/2021 1219   HGB 9.8 (L) 04/30/2022 1606   HGB 12.9 (L) 05/17/2012 1100   HCT 34.1 (L) 04/30/2022 1606   HCT 38.2 (L) 05/17/2012 1100   PLT 256 04/30/2022 1606   MCV 70 (L) 04/30/2022 1606   MCV 85.8 05/17/2012 1100   MCH 20.1 (L) 04/30/2022 1606   MCH 20.9 (L) 08/07/2021 1219   MCHC 28.7 (L) 04/30/2022 1606   MCHC 28.8 (L) 08/07/2021 1219   RDW 18.3 (H) 04/30/2022 1606   RDW 16.0 (H) 05/17/2012 1100   LYMPHSABS 0.5 (L) 08/07/2021 1219   LYMPHSABS 1.0 06/16/2021 1400   LYMPHSABS 0.9 05/17/2012 1100   MONOABS 0.6 08/07/2021 1219   MONOABS 0.4  05/17/2012 1100   EOSABS 0.3 08/07/2021 1219   EOSABS 0.2 06/16/2021 1400   BASOSABS 0.0 08/07/2021 1219   BASOSABS 0.1 06/16/2021 1400   BASOSABS 0.0 05/17/2012 1100    BMET    Component Value Date/Time   NA 143 04/30/2022 1606   NA 142 05/17/2012 1100   K 4.0 04/30/2022 1606   K 4.2 05/17/2012 1100   CL 108 (H) 04/30/2022 1606   CL 110 (H) 05/17/2012 1100   CO2 20 04/30/2022 1606   CO2 23 05/17/2012 1100   GLUCOSE 93 04/30/2022 1606   GLUCOSE 103 (H) 08/07/2021 1219   GLUCOSE 113 (H) 05/17/2012 1100   BUN 16 04/30/2022 1606   BUN 16.9 12/14/2012 1446   CREATININE 1.58 (H) 04/30/2022 1606   CREATININE 1.51 (H) 01/03/2017 1108   CREATININE 1.6 (H) 12/14/2012 1446   CALCIUM 8.6 04/30/2022 1606   CALCIUM 9.1 05/17/2012 1100   GFRNONAA 55 (L) 08/07/2021 1219   GFRNONAA 49 (L) 01/03/2017 1108   GFRAA 53 (L) 03/10/2018 1501   GFRAA 57 (L) 01/03/2017 1108     Assessment & Plan:   Left sided abdominal pain - Ambulatory referral to Gastroenterology  2. Dizziness  - Ambulatory referral to Physical Therapy - Ambulatory referral to Cardiology - CBC - Comprehensive metabolic panel - Ambulatory referral to Neurology  3. Bradycardia  - Ambulatory referral to Cardiology  4. Hypothyroidism, unspecified type  - CBC - Comprehensive metabolic panel - Thyroid Panel With TSH  5. Chronic nonintractable headache, unspecified headache type  - Ambulatory referral to Neurology  6. Memory loss  - Ambulatory referral to Neurology     Follow up:  Follow up in 3 months     Ivonne Andrew, NP 11/03/2022

## 2022-11-03 NOTE — Patient Instructions (Addendum)
1. Left sided abdominal pain  - Ambulatory referral to Gastroenterology  2. Dizziness  - Ambulatory referral to Physical Therapy - Ambulatory referral to Cardiology - CBC - Comprehensive metabolic panel - Ambulatory referral to Neurology  3. Bradycardia  - Ambulatory referral to Cardiology  4. Hypothyroidism, unspecified type  - CBC - Comprehensive metabolic panel - Thyroid Panel With TSH  5. Chronic nonintractable headache, unspecified headache type  - Ambulatory referral to Neurology  6. Memory loss  - Ambulatory referral to Neurology     Follow up:  Follow up in 3 months

## 2022-11-03 NOTE — Assessment & Plan Note (Signed)
-   Ambulatory referral to Gastroenterology  2. Dizziness  - Ambulatory referral to Physical Therapy - Ambulatory referral to Cardiology - CBC - Comprehensive metabolic panel - Ambulatory referral to Neurology  3. Bradycardia  - Ambulatory referral to Cardiology  4. Hypothyroidism, unspecified type  - CBC - Comprehensive metabolic panel - Thyroid Panel With TSH  5. Chronic nonintractable headache, unspecified headache type  - Ambulatory referral to Neurology  6. Memory loss  - Ambulatory referral to Neurology     Follow up:  Follow up in 3 months

## 2022-11-04 ENCOUNTER — Encounter: Payer: Self-pay | Admitting: Physician Assistant

## 2022-11-04 LAB — COMPREHENSIVE METABOLIC PANEL
ALT: 9 IU/L (ref 0–44)
AST: 9 IU/L (ref 0–40)
Albumin: 3.8 g/dL — ABNORMAL LOW (ref 3.9–4.9)
Alkaline Phosphatase: 95 IU/L (ref 44–121)
BUN/Creatinine Ratio: 10 (ref 10–24)
BUN: 14 mg/dL (ref 8–27)
Bilirubin Total: 0.3 mg/dL (ref 0.0–1.2)
CO2: 19 mmol/L — ABNORMAL LOW (ref 20–29)
Calcium: 8.6 mg/dL (ref 8.6–10.2)
Chloride: 109 mmol/L — ABNORMAL HIGH (ref 96–106)
Creatinine, Ser: 1.47 mg/dL — ABNORMAL HIGH (ref 0.76–1.27)
Globulin, Total: 2.5 g/dL (ref 1.5–4.5)
Glucose: 90 mg/dL (ref 70–99)
Potassium: 4.1 mmol/L (ref 3.5–5.2)
Sodium: 142 mmol/L (ref 134–144)
Total Protein: 6.3 g/dL (ref 6.0–8.5)
eGFR: 52 mL/min/{1.73_m2} — ABNORMAL LOW (ref >59)

## 2022-11-04 LAB — THYROID PANEL WITH TSH
Free Thyroxine Index: 2.2 (ref 1.2–4.9)
T3 Uptake Ratio: 30 % (ref 24–39)
T4, Total: 7.2 ug/dL (ref 4.5–12.0)
TSH: 2.68 u[IU]/mL (ref 0.450–4.500)

## 2022-11-04 LAB — CBC
Hematocrit: 36.3 % — ABNORMAL LOW (ref 37.5–51.0)
Hemoglobin: 9.9 g/dL — ABNORMAL LOW (ref 13.0–17.7)
MCH: 19 pg — ABNORMAL LOW (ref 26.6–33.0)
MCHC: 27.3 g/dL — ABNORMAL LOW (ref 31.5–35.7)
MCV: 70 fL — ABNORMAL LOW (ref 79–97)
Platelets: 283 10*3/uL (ref 150–450)
RBC: 5.2 x10E6/uL (ref 4.14–5.80)
RDW: 17.6 % — ABNORMAL HIGH (ref 11.6–15.4)
WBC: 11.5 10*3/uL — ABNORMAL HIGH (ref 3.4–10.8)

## 2022-11-05 ENCOUNTER — Other Ambulatory Visit: Payer: Self-pay

## 2022-11-05 ENCOUNTER — Ambulatory Visit: Payer: Medicare PPO | Attending: Physical Therapy | Admitting: Physical Therapy

## 2022-11-05 DIAGNOSIS — E039 Hypothyroidism, unspecified: Secondary | ICD-10-CM

## 2022-11-05 MED ORDER — LEVOTHYROXINE SODIUM 112 MCG PO TABS
112.0000 ug | ORAL_TABLET | Freq: Every day | ORAL | 1 refills | Status: DC
Start: 2022-11-05 — End: 2022-11-18

## 2022-11-09 ENCOUNTER — Other Ambulatory Visit: Payer: Self-pay

## 2022-11-10 ENCOUNTER — Other Ambulatory Visit: Payer: Self-pay

## 2022-11-11 DIAGNOSIS — Z902 Acquired absence of lung [part of]: Secondary | ICD-10-CM | POA: Diagnosis not present

## 2022-11-12 ENCOUNTER — Ambulatory Visit: Payer: Medicare PPO | Admitting: Physical Therapy

## 2022-11-16 ENCOUNTER — Other Ambulatory Visit: Payer: Self-pay

## 2022-11-16 DIAGNOSIS — E559 Vitamin D deficiency, unspecified: Secondary | ICD-10-CM

## 2022-11-16 DIAGNOSIS — I10 Essential (primary) hypertension: Secondary | ICD-10-CM

## 2022-11-16 MED ORDER — VITAMIN D (ERGOCALCIFEROL) 1.25 MG (50000 UNIT) PO CAPS: ORAL_CAPSULE | ORAL | 3 refills | Status: DC

## 2022-11-16 MED ORDER — HYDRALAZINE HCL 25 MG PO TABS
50.0000 mg | ORAL_TABLET | Freq: Three times a day (TID) | ORAL | 3 refills | Status: DC
Start: 2022-11-16 — End: 2022-11-24

## 2022-11-16 MED ORDER — ATORVASTATIN CALCIUM 40 MG PO TABS: 40.0000 mg | ORAL_TABLET | Freq: Every day | ORAL | 0 refills | Status: DC

## 2022-11-18 ENCOUNTER — Other Ambulatory Visit: Payer: Self-pay

## 2022-11-18 DIAGNOSIS — E039 Hypothyroidism, unspecified: Secondary | ICD-10-CM

## 2022-11-18 MED ORDER — LEVOTHYROXINE SODIUM 112 MCG PO TABS: 112.0000 ug | ORAL_TABLET | Freq: Every day | ORAL | 1 refills | Status: DC

## 2022-11-20 ENCOUNTER — Other Ambulatory Visit: Payer: Self-pay | Admitting: Vascular Surgery

## 2022-11-24 ENCOUNTER — Other Ambulatory Visit: Payer: Self-pay

## 2022-11-24 ENCOUNTER — Ambulatory Visit: Payer: Medicare PPO | Admitting: Physician Assistant

## 2022-11-24 ENCOUNTER — Ambulatory Visit: Payer: Medicare PPO

## 2022-11-24 DIAGNOSIS — E559 Vitamin D deficiency, unspecified: Secondary | ICD-10-CM

## 2022-11-24 DIAGNOSIS — E039 Hypothyroidism, unspecified: Secondary | ICD-10-CM

## 2022-11-24 DIAGNOSIS — I10 Essential (primary) hypertension: Secondary | ICD-10-CM

## 2022-11-24 DIAGNOSIS — Z029 Encounter for administrative examinations, unspecified: Secondary | ICD-10-CM

## 2022-11-24 MED ORDER — ATORVASTATIN CALCIUM 40 MG PO TABS: 40.0000 mg | ORAL_TABLET | Freq: Every day | ORAL | 0 refills | Status: DC

## 2022-11-24 MED ORDER — VITAMIN D (ERGOCALCIFEROL) 1.25 MG (50000 UNIT) PO CAPS: ORAL_CAPSULE | ORAL | 3 refills | Status: DC

## 2022-11-24 MED ORDER — LEVOTHYROXINE SODIUM 112 MCG PO TABS: 112.0000 ug | ORAL_TABLET | Freq: Every day | ORAL | 1 refills | Status: DC

## 2022-11-24 MED ORDER — METOPROLOL TARTRATE 25 MG PO TABS: 25.0000 mg | ORAL_TABLET | Freq: Two times a day (BID) | ORAL | 3 refills | Status: DC

## 2022-11-24 MED ORDER — HYDRALAZINE HCL 25 MG PO TABS
50.0000 mg | ORAL_TABLET | Freq: Three times a day (TID) | ORAL | 3 refills | Status: AC
Start: 2022-11-24 — End: ?

## 2022-11-24 NOTE — Telephone Encounter (Signed)
Please advise [facility]

## 2022-11-24 NOTE — Progress Notes (Deleted)
Assessment/Plan:   [name] [name] is a very pleasant 67 y.o. year old RH male with a history of hypertension, hyperlipidemia, chronic symptomatic bradycardia, depression, CAD, hypothyroidism, emphysema, history of lung cancer status post LLL, with non-intractable headache, status post chemo with cisplatin, seen today for evaluation of memory loss. MoCA today is /30.***.  Workup is in progress.  Patient is able to participate on his IADLs and continues to drive without significant difficulties.***    Memory Impairment of unclear etiology  MRI brain without contrast to assess for underlying structural abnormality and assess vascular load  Neurocognitive testing to further evaluate cognitive concerns and determine other underlying cause of memory changes, including potential contribution from sleep, anxiety, attention, or depression  Check B12 Continue PT for strength and balance Continue cardiology follow-up for bradycardia Continue to control mood as per PCP Recommend good control of cardiovascular risk factors Folllow up in 1-2  months***  Headaches, not intractable.     Etiology still unclear, however the patient has had chronic anemia with HH on the latest labs in September 2024 of 9.9-36.3, and MCV of 70, stable from prior labs dating back to at least January 2023.  This can be a contributor to his headaches.  I do not see any iron studies drawn, will order for further evaluation to rule out iron deficiency anemia. Patient admits to poor oral intake, dehydration can certainly be a factor as well. Will await results from MRI of the brain to further evaluate any structural abnormalities and vascular load.   Limit use of pain relievers to no more than 2 days out of week to prevent risk of rebound or medication-overuse headache.  Keep headache diary  Exercise, hydration, caffeine cessation, sleep hygiene, monitor for and avoid triggers  Consider:  magnesium citrate 400mg  daily,  riboflavin 400mg  daily, and coenzyme Q10 100mg  three times daily Always keep in mind that currently taking a hormone or birth control may be a possible trigger or aggravating factor for migraine.    Subjective:   The patient is accompanied by ***  who supplements the history.   How long did patient have memory difficulties? For the last ***.  Reports some difficulty remembering new information, conversations and names.  Long-term memory is good. repeats oneself?  Endorsed Disoriented when walking into a room?  Patient denies except occasionally not remembering what patient came to the room for ***  Leaving objects in unusual places? Denies.   Wandering behavior?  denies .  Any personality changes?  Denies.   Any history of depression?:  Denies   Hallucinations or paranoia?  Denies   Seizures?  Denies    Any sleep changes?   Sleeps well***does not sleep well***denies vivid dreams, REM behavior or sleepwalking   Sleep apnea?  Denies   Any hygiene concerns?  Denies   Independent of bathing and dressing?  Endorsed  Does the patient needs help with medications? Patient is in charge *** Who is in charge of the finances? Patient is in charge   *** Any changes in appetite?  Denies ***   Patient have trouble swallowing? Denies.   Does the patient cook? ***Not much. Any kitchen accidents such as leaving the stove on? Denies.   Any history of headaches?  He reports right-sided headaches*** Chronic pain ? Denies.   Ambulates with difficulty?  Denies. *** Recent falls or head injuries? Denies.   Vision changes? Denies.   Unilateral weakness, numbness or tingling? Denies.   Any tremors?  Denies.   Any anosmia?  Denies.   Any incontinence of urine? Denies.   Any bowel dysfunction? Denies.      Patient lives with  *** History of heavy alcohol intake? Denies.   History of heavy tobacco use? Denies.   Family history of dementia? Denies.  Does patient drive? Yes *** Recent labs 424, TSH  2.680, H&H 9.9-36.3, with low MCV 70   Past Medical History:  Diagnosis Date   Arthritis    Chronic right ear pain 07/2019   Coronary artery disease    Depression 06/12/11   Buproprion per Dr. [name]   Drainage from ear, right 07/2019   Dyspnea    Full dentures    Fitting Per Dr. 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 of Cisplatin and Dhydrtion   Skin rash 01/2019   Smoking 06/12/11   1/2 pack/day   Status post chemotherapy 10/30/10 - 11/09/10    2 doses of Q 3 week Cisplatin   Status post radiation therapy 10/11/10 - 12/24/10   Bilateral Neck and Mucosa axis /  70 gray in 35 fractions   Xerostomia      Past Surgical History:  Procedure Laterality Date   APPENDECTOMY     BIOPSY TONGUE     TonsilandBaseoftongue   BRONCHIAL BRUSHINGS  07/20/2021   Procedure: BRONCHIAL BRUSHINGS;  Surgeon: Leslye Peer, MD;  Location: [facility] ENDOSCOPY;  Service: Pulmonary;;   BRONCHIAL NEEDLE ASPIRATION BIOPSY  07/20/2021   Procedure: BRONCHIAL NEEDLE ASPIRATION BIOPSIES;  Surgeon: Leslye Peer, MD;  Location: [facility] ENDOSCOPY;  Service: Pulmonary;;   FIDUCIAL MARKER PLACEMENT  07/20/2021   Procedure: FIDUCIAL DYE MARKING;  Surgeon: Leslye Peer, MD;  Location: [facility] ENDOSCOPY;  Service: Pulmonary;;   HERNIA REPAIR     umbilical with mesh   INTERCOSTAL NERVE BLOCK Left 07/20/2021   Procedure: INTERCOSTAL NERVE BLOCK;  Surgeon: Corliss Skains, MD;  Location: [facility] OR;  Service: Thoracic;  Laterality: Left;   IR NEPHROSTOMY EXCHANGE LEFT  01/10/2017   IR NEPHROSTOMY PLACEMENT LEFT  11/15/2016   LEFT HEART CATH AND CORONARY ANGIOGRAPHY N/A 01/18/2017   Procedure: LEFT HEART CATH AND CORONARY ANGIOGRAPHY;  Surgeon: Lennette Bihari, MD;  Location: [facility] INVASIVE CV LAB;  Service: Cardiovascular;  Laterality: N/A;   LYMPH NODE  BIOPSY Left 07/20/2021   Procedure: LYMPH NODE BIOPSY;  Surgeon: Corliss Skains, MD;  Location: [facility] OR;  Service: Thoracic;  Laterality: Left;   NEPHROLITHOTOMY Left 02/11/2017   Procedure: NEPHROLITHOTOMY PERCUTANEOUS;  Surgeon: Ihor Gully, MD;  Location: [facility] ORS;  Service: Urology;  Laterality: Left;   PEG TUBE REMOVAL  04/13/11   PERIPHERAL VASCULAR INTERVENTION Right 03/05/2021   Procedure: PERIPHERAL VASCULAR INTERVENTION;  Surgeon: Nada Libman, MD;  Location: [facility] INVASIVE CV LAB;  Service: Cardiovascular;  Laterality: Right;  right subclavian   THROMBECTOMY BRACHIAL ARTERY Right 03/02/2021   Procedure: THROMBECTOMY RIGHT SUBCLAVIAN, BRACHIAL, AXILLA, AND RADIAL ARTERY;  Surgeon: Cephus Shelling, MD;  Location: [facility] OR;  Service: Vascular;  Laterality: Right;   TONSILLECTOMY     right side   UPPER EXTREMITY ANGIOGRAPHY N/A 03/05/2021   Procedure: UPPER EXTREMITY ANGIOGRAPHY;  Surgeon: Nada Libman, MD;  Location: [facility] INVASIVE CV LAB;  Service: Cardiovascular;  Laterality: N/A;  VIDEO BRONCHOSCOPY WITH RADIAL ENDOBRONCHIAL ULTRASOUND  07/20/2021   Procedure: VIDEO BRONCHOSCOPY WITH RADIAL ENDOBRONCHIAL ULTRASOUND;  Surgeon: Leslye Peer, MD;  Location: [facility] ENDOSCOPY;  Service: Pulmonary;;     Allergies  Allergen Reactions   Codeine Nausea Only    Current Outpatient Medications  Medication Instructions   albuterol (VENTOLIN HFA) 108 (90 Base) MCG/ACT inhaler INHALE 2 PUFFS INTO THE LUNGS EVERY 4 (FOUR) HOURS AS NEEDED FOR WHEEZING OR SHORTNESS OF BREATH (COUGH, SHORTNESS OF BREATH OR WHEEZING.).   amLODipine (NORVASC) 10 mg, Oral, Every morning   ASPIRIN LOW DOSE 81 MG tablet TAKE 1 TABLET(81 MG) BY MOUTH DAILY. SWALLOW WHOLE   atorvastatin (LIPITOR) 40 mg, Oral, Daily   Budeson-Glycopyrrol-Formoterol (BREZTRI AEROSPHERE) 160-9-4.8 MCG/ACT AERO 2 puffs, Inhalation, 2 times daily   budesonide-formoterol (SYMBICORT) 80-4.5 MCG/ACT inhaler INHALE 2 PUFFS INTO THE LUNGS 2 (TWO)  TIMES DAILY.   clopidogrel (PLAVIX) 75 MG tablet TAKE 1 TABLET(75 MG) BY MOUTH DAILY   docusate sodium (COLACE) 100 mg, Oral, Daily at bedtime   hydrALAZINE (APRESOLINE) 50 mg, Oral, Every 8 hours   isosorbide mononitrate (IMDUR) 30 mg, Oral, Every 8 hours PRN   levothyroxine (SYNTHROID) 112 mcg, Oral, Daily   metoprolol tartrate (LOPRESSOR) 25 mg, Oral, 2 times daily   nitrofurantoin, macrocrystal-monohydrate, (MACROBID) 100 MG capsule Take 1 capsule (100 mg total) by mouth 2 times daily for 5 days.   oxybutynin (DITROPAN) 5 MG tablet Take 1 tablet (5 mg total) by mouth 3 (three)  times daily as needed (bladder spasm/leakage around catheter).   oxyCODONE (OXY IR/ROXICODONE) 5 mg, Oral, Every 6 hours PRN   Vitamin D, Ergocalciferol, (DRISDOL) 1.25 MG (50000 UNIT) CAPS capsule TAKE 1 CAPSULE BY MOUTH EVERY 7 DAYS.     VITALS:  There were no vitals filed for this visit.    PHYSICAL EXAM   HEENT:  Normocephalic, atraumatic. The superficial temporal arteries are without ropiness or tenderness. Cardiovascular: Regular rate and rhythm. Lungs: Clear to auscultation bilaterally. Neck: There are no carotid bruits noted bilaterally.  NEUROLOGICAL:     No data to display             08/19/2020    2:50 PM  MMSE - Mini Mental State Exam  Orientation to time 5  Orientation to Place 5  Registration 3  Attention/ Calculation 5  Recall 3  Language- name 2 objects 2  Language- repeat 1  Language- follow 3 step command 3  Language- read & follow direction 1  Write a sentence 1  Copy design 1  Total score 30     Orientation:  Alert and oriented to person, place and time. No aphasia or dysarthria. Fund of knowledge is appropriate. Recent memory impaired and remote memory intact.  Attention and concentration are normal.  Able to name objects and repeat phrases. Delayed recall  /5 Cranial nerves: There is good facial symmetry. Extraocular muscles are intact and visual fields are full to  confrontational testing. Speech is fluent and clear. No tongue deviation. Hearing is intact to conversational tone. Tone: Tone is good throughout. Sensation: Sensation is intact to light touch. Vibration is intact at the bilateral big toe.  Coordination: The patient has no difficulty with RAM's or FNF bilaterally. Normal finger to nose  Motor: Strength is 5/5 in the bilateral upper and lower extremities. There is no pronator drift. There are no fasciculations noted. DTR's: Deep tendon reflexes are 2/4 bilaterally. Gait and Station: The patient is able to ambulate without difficulty  The patient is able to heel toe walk . Gait is cautious and narrow. The patient is able to ambulate in a tandem fashion.       Thank you for allowing [place] the opportunity to participate in the care of this nice patient. Please do not hesitate to contact us for any questions or concerns.   Total time spent on today's visit was *** minutes dedicated to this patient today, preparing to see patient, examining the patient, ordering tests and/or medications and counseling the patient, documenting clinical information in the EHR or other health record, independently interpreting results and communicating results to the patient/family, discussing treatment and goals, answering patient's questions and coordinating care.  Cc:  Ivonne Andrew, NP  Marlowe Kays 11/24/2022 7:34 AM

## 2022-11-25 ENCOUNTER — Encounter: Payer: Self-pay | Admitting: Physician Assistant

## 2022-11-29 ENCOUNTER — Ambulatory Visit: Payer: Self-pay | Admitting: Nurse Practitioner

## 2022-11-30 ENCOUNTER — Ambulatory Visit (INDEPENDENT_AMBULATORY_CARE_PROVIDER_SITE_OTHER): Payer: Medicare PPO | Admitting: Nurse Practitioner

## 2022-11-30 ENCOUNTER — Encounter: Payer: Self-pay | Admitting: Nurse Practitioner

## 2022-11-30 VITALS — BP 105/52 | HR 50 | Resp 18 | Ht 69.0 in | Wt 157.0 lb

## 2022-11-30 DIAGNOSIS — W19XXXA Unspecified fall, initial encounter: Secondary | ICD-10-CM | POA: Diagnosis not present

## 2022-11-30 DIAGNOSIS — F17200 Nicotine dependence, unspecified, uncomplicated: Secondary | ICD-10-CM

## 2022-11-30 DIAGNOSIS — I10 Essential (primary) hypertension: Secondary | ICD-10-CM

## 2022-11-30 DIAGNOSIS — M25512 Pain in left shoulder: Secondary | ICD-10-CM | POA: Diagnosis not present

## 2022-11-30 DIAGNOSIS — M25562 Pain in left knee: Secondary | ICD-10-CM | POA: Diagnosis not present

## 2022-11-30 DIAGNOSIS — F323 Major depressive disorder, single episode, severe with psychotic features: Secondary | ICD-10-CM | POA: Diagnosis not present

## 2022-11-30 MED ORDER — HYDRALAZINE HCL 25 MG PO TABS
25.0000 mg | ORAL_TABLET | Freq: Three times a day (TID) | ORAL | 3 refills | Status: DC
Start: 2022-11-30 — End: 2022-12-12

## 2022-11-30 MED ORDER — OXYCODONE HCL 5 MG PO TABS
5.0000 mg | ORAL_TABLET | Freq: Four times a day (QID) | ORAL | 0 refills | Status: DC | PRN
Start: 2022-11-30 — End: 2022-12-12

## 2022-11-30 MED ORDER — ESCITALOPRAM OXALATE 10 MG PO TABS
10.0000 mg | ORAL_TABLET | Freq: Every day | ORAL | 1 refills | Status: DC
Start: 2022-11-30 — End: 2022-12-12

## 2022-11-30 NOTE — Assessment & Plan Note (Signed)
Home fall prevention education completed Encouraged to use his walker regularly to help prevent falls We discussed referral for physical therapy but the patient declined

## 2022-11-30 NOTE — Assessment & Plan Note (Signed)
BP Readings from Last 3 Encounters:  11/30/22 (!) 105/52  11/03/22 (!) 115/53  04/30/22 (!) 115/54  Currently on hydralazine 25 mg 3 times daily, metoprolol 25 mg twice daily Has amlodipine ordered but he has not been taking the medication.  Stated that he ran out of isosorbide mononitrate about 3 to 4 weeks ago We will continue hydralazine 25 mg 3 times daily, metoprolol 25 mg twice daily Amlodipine and isosorbide mononitrate discontinued as the patient has not been taking both medications and blood pressure is soft Patient encouraged to monitor blood pressure at home and report blood pressure readings greater then 130/80 Follow-up in the office in 4 weeks DASH diet advised, engage in regular walking exercises as tolerated

## 2022-11-30 NOTE — Assessment & Plan Note (Signed)
Flowsheet Row Office Visit from 11/30/2022 in [facility]  PHQ-9 Total Score 18     Start Lexapro 10 mg daily Patient denies SI, HI Patient referred for counseling Follow-up in the office in 4 weeks

## 2022-11-30 NOTE — Assessment & Plan Note (Signed)
Smokes about half pack of cigarettes daily Need to avoid smoking discussed with the patient.

## 2022-11-30 NOTE — Assessment & Plan Note (Addendum)
Secondary to fall Patient deceased x-ray oxycodone 5 mg every 6 hours as needed ordered.  I discussed with the patient that this medication can make him drowsy and he should only take med as prescribed Encouraged to alternate with Tylenol 650 mg every 6 hours as needed Fall prevention education completed We discussed referral for physical therapy but the patient declined

## 2022-11-30 NOTE — Progress Notes (Signed)
Acute Office Visit  Subjective:     Patient ID: [name] [name], male    DOB: 05/26/1955, 67 y.o.   MRN: [id number]  Chief Complaint  Patient presents with   Fall    He has fallen a couple of times. He states that he just walks down hallway and falls. No dizziness. Hasn't been on isosorbide in quite a while. Is down to 1 pill every 8 hours instead of 2 of the hydralazine. Thinks he is falling from drop in pressure.     HPI [name] [name]  has a past medical history of Arthritis, Chronic right ear pain (07/2019), Coronary artery disease, Depression (06/12/11), Drainage from ear, right (07/2019), Dyspnea, Full dentures, Heart murmur, History of kidney stones, Hypertension, Hypothyroidism, Oropharyngeal cancer (HCC) (2012), Protein calorie malnutrition (HCC) (05/31/11), Renal insufficiency (2012), Skin rash (01/2019), Smoking (06/12/11), Status post chemotherapy (10/30/10 - 11/09/10), Status post radiation therapy (10/11/10 - 12/24/10), and Xerostomia.    Fall . Patient presents with complaints of fall.  He is accompanied by his daughter Patient stated that he fell 4 days ago while he was going to the bathroom, he had fell on his left side hitting his left shoulder left knee and front of  his head .  He reports constant throbbing pain in the left shoulder, pain in the left knee with ambulation.  He denies pain in the head.  Currently has aching pain rated 8/10.  Has unsteady gait he has a walker at home that he has been using.    Depression.  Patient states that he is depressed from not having things to do since his wife died 4 years ago.  He lives at home with his children.  He denies SI, HI.  Tobacco use disorder .smokes half pack of cigarettes daily.  He currently denies shortness of breath, wheezing cough     Review of Systems  Constitutional:  Negative for activity change, appetite change, chills, diaphoresis, fatigue and fever.  HENT:  Negative for congestion, dental problem, drooling and ear  discharge.   Respiratory:  Negative for apnea, cough, choking, chest tightness, shortness of breath and wheezing.   Cardiovascular: Negative.  Negative for chest pain, palpitations and leg swelling.  Gastrointestinal:  Negative for abdominal distention, abdominal pain, anal bleeding, blood in stool, constipation, diarrhea and vomiting.  Genitourinary:  Negative for difficulty urinating, flank pain, frequency and genital sores.  Musculoskeletal:  Positive for arthralgias and gait problem. Negative for back pain and joint swelling.  Skin:  Negative for color change, pallor and rash.  Neurological:  Negative for dizziness, facial asymmetry, light-headedness, numbness and headaches.  Psychiatric/Behavioral:  Negative for agitation, behavioral problems, confusion, hallucinations, self-injury and suicidal ideas.         Objective:    BP (!) 105/52   Pulse (!) 50   Resp 18   Ht 5\' 9"  (1.753 m)   Wt 157 lb (71.2 kg)   SpO2 96%   BMI 23.18 kg/m    Physical Exam Vitals and nursing note reviewed.  Constitutional:      General: He is not in acute distress.    Appearance: Normal appearance. He is not ill-appearing, toxic-appearing or diaphoretic.  HENT:     Mouth/Throat:     Mouth: Mucous membranes are moist.     Pharynx: Oropharynx is clear. No oropharyngeal exudate or posterior oropharyngeal erythema.  Eyes:     General: No scleral icterus.       Right eye: No discharge.  Left eye: No discharge.     Extraocular Movements: Extraocular movements intact.     Conjunctiva/sclera: Conjunctivae normal.  Cardiovascular:     Rate and Rhythm: Normal rate and regular rhythm.     Pulses: Normal pulses.     Heart sounds: Normal heart sounds. No murmur heard.    No friction rub. No gallop.  Pulmonary:     Effort: Pulmonary effort is normal. No respiratory distress.     Breath sounds: Normal breath sounds. No stridor. No wheezing, rhonchi or rales.  Chest:     Chest wall: No tenderness.   Abdominal:     General: There is no distension.     Palpations: Abdomen is soft.     Tenderness: There is no abdominal tenderness. There is no right CVA tenderness, left CVA tenderness or guarding.  Musculoskeletal:        General: Tenderness present. No swelling, deformity or signs of injury.     Right lower leg: No edema.     Left lower leg: No edema.     Comments: Tenderness on range of motion of left shoulder and left knee Has abrasion on his left knee and left elbow, wound does not appear infected No swelling or drainage noted  Skin:    General: Skin is warm and dry.     Capillary Refill: Capillary refill takes less than 2 seconds.     Coloration: Skin is not jaundiced or pale.     Findings: Bruising present. No erythema or lesion.  Neurological:     Mental Status: He is alert and oriented to person, place, and time.     Motor: No weakness.     Coordination: Coordination normal.     Gait: Gait abnormal.  Psychiatric:        Mood and Affect: Mood normal.        Behavior: Behavior normal.        Thought Content: Thought content normal.        Judgment: Judgment normal.     No results found for any visits on 11/30/22.      Assessment & Plan:   Problem List Items Addressed This Visit       Cardiovascular and Mediastinum   Essential hypertension    BP Readings from Last 3 Encounters:  11/30/22 (!) 105/52  11/03/22 (!) 115/53  04/30/22 (!) 115/54  Currently on hydralazine 25 mg 3 times daily, metoprolol 25 mg twice daily Has amlodipine ordered but he has not been taking the medication.  Stated that he ran out of isosorbide mononitrate about 3 to 4 weeks ago We will continue hydralazine 25 mg 3 times daily, metoprolol 25 mg twice daily Amlodipine and isosorbide mononitrate discontinued as the patient has not been taking both medications and blood pressure is soft Patient encouraged to monitor blood pressure at home and report blood pressure readings greater then  130/80 Follow-up in the office in 4 weeks DASH diet advised, engage in regular walking exercises as tolerated      Relevant Medications   hydrALAZINE (APRESOLINE) 25 MG tablet     Other   Tobacco use disorder    Smokes about half pack of cigarettes daily Need to avoid smoking discussed with the patient.      Acute pain of left knee    Secondary to fall Patient deceased x-ray oxycodone 5 mg every 6 hours as needed ordered.  I discussed with the patient that this medication can make him drowsy and he should  only take med as prescribed Encouraged to alternate with Tylenol 650 mg every 6 hours as needed Fall prevention education completed We discussed referral for physical therapy but the patient declined      Relevant Medications   oxyCODONE (OXY IR/ROXICODONE) 5 MG immediate release tablet   Acute pain of left shoulder    Secondary to fall Patient deceased x-ray oxycodone 5 mg every 6 hours as needed ordered. 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)    Flowsheet Row Office Visit from 11/30/2022 in [facility]  PHQ-9 Total Score 18     Start Lexapro 10 mg daily Patient denies SI, HI Patient referred for counseling Follow-up in the office in 4 weeks      Relevant Medications   escitalopram (LEXAPRO) 10 MG tablet   Fall - Primary    Home fall prevention education completed Encouraged to use his walker regularly to help prevent falls We discussed referral for physical therapy but the patient declined       Meds ordered this encounter   Medications   oxyCODONE (OXY IR/ROXICODONE) 5 MG immediate release tablet    Sig: Take 1 tablet (5 mg total) by mouth every 6 (six) hours as needed for moderate pain or severe pain.    Dispense:  10 tablet    Refill:  0   escitalopram (LEXAPRO) 10 MG tablet    Sig: Take 1 tablet (10 mg total) by mouth daily.    Dispense:  60 tablet    Refill:  1   hydrALAZINE (APRESOLINE) 25 MG tablet    Sig: Take 1 tablet (25 mg total) by mouth 3 (three) times daily.    Dispense:  90 tablet    Refill:  3    Return in about 4 weeks (around 12/28/2022) for depression , HTN, F/U WITH SUSAN FOR COUNSELING.  Donell Beers, FNP

## 2022-11-30 NOTE — Patient Instructions (Signed)
1. Essential hypertension Blood pressure goal is less than 130/80.  Please continue your current medications and report blood pressure readings greater than 130/80  2. Acute pain of left knee  - oxyCODONE (OXY IR/ROXICODONE) 5 MG immediate release tablet; Take 1 tablet (5 mg total) by mouth every 6 (six) hours as needed for moderate pain or severe pain.  Dispense: 10 tablet; Refill: 0  Alternate oxycodone weeks Tylenol 650 mg every 6 hours as needed.   3. Acute pain of left shoulder   - oxyCODONE (OXY IR/ROXICODONE) 5 MG immediate release tablet; Take 1 tablet (5 mg total) by mouth every 6 (six) hours as needed for moderate pain or severe pain.  Dispense: 10 tablet; Refill: 0  Alternate oxycodone weeks Tylenol 650 mg every 6 hours as needed.   4. Current severe episode of major depressive disorder with psychotic features, unspecified whether recurrent (HCC)  - escitalopram (LEXAPRO) 10 MG tablet; Take 1 tablet (10 mg total) by mouth daily.  Dispense: 60 tablet; Refill: 1     It is important that you exercise regularly at least 30 minutes 5 times a week as tolerated  Think about what you will eat, plan ahead. Choose " clean, green, fresh or frozen" over canned, processed or packaged foods which are more sugary, salty and fatty. 70 to 75% of food eaten should be vegetables and fruit. Three meals at set times with snacks allowed between meals, but they must be fruit or vegetables. Aim to eat over a 12 hour period , example 7 am to 7 pm, and STOP after  your last meal of the day. Drink water,generally about 64 ounces per day, no other drink is as healthy. Fruit juice is best enjoyed in a healthy way, by EATING the fruit.  [facility] for choosing [facility] we consider it a privelige to serve you.

## 2022-12-03 ENCOUNTER — Other Ambulatory Visit: Payer: Self-pay | Admitting: Nurse Practitioner

## 2022-12-03 DIAGNOSIS — Z1212 Encounter for screening for malignant neoplasm of rectum: Secondary | ICD-10-CM

## 2022-12-03 DIAGNOSIS — Z1211 Encounter for screening for malignant neoplasm of colon: Secondary | ICD-10-CM

## 2022-12-09 ENCOUNTER — Encounter (HOSPITAL_COMMUNITY): Payer: Self-pay

## 2022-12-09 ENCOUNTER — Emergency Department (HOSPITAL_COMMUNITY): Payer: Medicare PPO

## 2022-12-09 ENCOUNTER — Inpatient Hospital Stay (HOSPITAL_COMMUNITY)
Admission: EM | Admit: 2022-12-09 | Discharge: 2022-12-31 | DRG: 270 | Disposition: E | Payer: Medicare PPO | Attending: Pulmonary Disease | Admitting: Pulmonary Disease

## 2022-12-09 ENCOUNTER — Other Ambulatory Visit: Payer: Self-pay

## 2022-12-09 DIAGNOSIS — Z8521 Personal history of malignant neoplasm of larynx: Secondary | ICD-10-CM

## 2022-12-09 DIAGNOSIS — I214 Non-ST elevation (NSTEMI) myocardial infarction: Secondary | ICD-10-CM | POA: Diagnosis not present

## 2022-12-09 DIAGNOSIS — I272 Pulmonary hypertension, unspecified: Secondary | ICD-10-CM | POA: Diagnosis present

## 2022-12-09 DIAGNOSIS — I130 Hypertensive heart and chronic kidney disease with heart failure and stage 1 through stage 4 chronic kidney disease, or unspecified chronic kidney disease: Secondary | ICD-10-CM | POA: Diagnosis present

## 2022-12-09 DIAGNOSIS — E039 Hypothyroidism, unspecified: Secondary | ICD-10-CM | POA: Diagnosis present

## 2022-12-09 DIAGNOSIS — J449 Chronic obstructive pulmonary disease, unspecified: Secondary | ICD-10-CM | POA: Diagnosis not present

## 2022-12-09 DIAGNOSIS — E874 Mixed disorder of acid-base balance: Secondary | ICD-10-CM | POA: Diagnosis present

## 2022-12-09 DIAGNOSIS — J984 Other disorders of lung: Secondary | ICD-10-CM | POA: Diagnosis not present

## 2022-12-09 DIAGNOSIS — I512 Rupture of papillary muscle, not elsewhere classified: Secondary | ICD-10-CM | POA: Diagnosis not present

## 2022-12-09 DIAGNOSIS — N1832 Chronic kidney disease, stage 3b: Secondary | ICD-10-CM | POA: Diagnosis present

## 2022-12-09 DIAGNOSIS — Z885 Allergy status to narcotic agent status: Secondary | ICD-10-CM

## 2022-12-09 DIAGNOSIS — I2510 Atherosclerotic heart disease of native coronary artery without angina pectoris: Secondary | ICD-10-CM | POA: Diagnosis not present

## 2022-12-09 DIAGNOSIS — J440 Chronic obstructive pulmonary disease with acute lower respiratory infection: Secondary | ICD-10-CM | POA: Diagnosis present

## 2022-12-09 DIAGNOSIS — J9601 Acute respiratory failure with hypoxia: Secondary | ICD-10-CM | POA: Diagnosis present

## 2022-12-09 DIAGNOSIS — I5031 Acute diastolic (congestive) heart failure: Secondary | ICD-10-CM | POA: Diagnosis not present

## 2022-12-09 DIAGNOSIS — J189 Pneumonia, unspecified organism: Secondary | ICD-10-CM | POA: Diagnosis not present

## 2022-12-09 DIAGNOSIS — D75839 Thrombocytosis, unspecified: Secondary | ICD-10-CM | POA: Diagnosis present

## 2022-12-09 DIAGNOSIS — R9431 Abnormal electrocardiogram [ECG] [EKG]: Secondary | ICD-10-CM | POA: Diagnosis present

## 2022-12-09 DIAGNOSIS — Z66 Do not resuscitate: Secondary | ICD-10-CM | POA: Diagnosis not present

## 2022-12-09 DIAGNOSIS — J810 Acute pulmonary edema: Secondary | ICD-10-CM | POA: Diagnosis not present

## 2022-12-09 DIAGNOSIS — I340 Nonrheumatic mitral (valve) insufficiency: Secondary | ICD-10-CM | POA: Diagnosis present

## 2022-12-09 DIAGNOSIS — I341 Nonrheumatic mitral (valve) prolapse: Secondary | ICD-10-CM | POA: Diagnosis present

## 2022-12-09 DIAGNOSIS — I700 Atherosclerosis of aorta: Secondary | ICD-10-CM | POA: Diagnosis not present

## 2022-12-09 DIAGNOSIS — I739 Peripheral vascular disease, unspecified: Secondary | ICD-10-CM | POA: Diagnosis present

## 2022-12-09 DIAGNOSIS — I252 Old myocardial infarction: Secondary | ICD-10-CM

## 2022-12-09 DIAGNOSIS — Z902 Acquired absence of lung [part of]: Secondary | ICD-10-CM | POA: Diagnosis not present

## 2022-12-09 DIAGNOSIS — Z809 Family history of malignant neoplasm, unspecified: Secondary | ICD-10-CM

## 2022-12-09 DIAGNOSIS — R570 Cardiogenic shock: Secondary | ICD-10-CM | POA: Diagnosis present

## 2022-12-09 DIAGNOSIS — Z515 Encounter for palliative care: Secondary | ICD-10-CM | POA: Diagnosis not present

## 2022-12-09 DIAGNOSIS — Z1152 Encounter for screening for COVID-19: Secondary | ICD-10-CM | POA: Diagnosis not present

## 2022-12-09 DIAGNOSIS — Z452 Encounter for adjustment and management of vascular access device: Secondary | ICD-10-CM | POA: Diagnosis not present

## 2022-12-09 DIAGNOSIS — D509 Iron deficiency anemia, unspecified: Secondary | ICD-10-CM | POA: Diagnosis present

## 2022-12-09 DIAGNOSIS — M25512 Pain in left shoulder: Secondary | ICD-10-CM | POA: Diagnosis present

## 2022-12-09 DIAGNOSIS — R0600 Dyspnea, unspecified: Principal | ICD-10-CM

## 2022-12-09 DIAGNOSIS — Z7982 Long term (current) use of aspirin: Secondary | ICD-10-CM

## 2022-12-09 DIAGNOSIS — Z79890 Hormone replacement therapy (postmenopausal): Secondary | ICD-10-CM

## 2022-12-09 DIAGNOSIS — F32A Depression, unspecified: Secondary | ICD-10-CM | POA: Diagnosis present

## 2022-12-09 DIAGNOSIS — J9602 Acute respiratory failure with hypercapnia: Secondary | ICD-10-CM | POA: Diagnosis not present

## 2022-12-09 DIAGNOSIS — R578 Other shock: Secondary | ICD-10-CM | POA: Diagnosis not present

## 2022-12-09 DIAGNOSIS — J441 Chronic obstructive pulmonary disease with (acute) exacerbation: Secondary | ICD-10-CM | POA: Diagnosis not present

## 2022-12-09 DIAGNOSIS — N179 Acute kidney failure, unspecified: Secondary | ICD-10-CM | POA: Diagnosis present

## 2022-12-09 DIAGNOSIS — J9801 Acute bronchospasm: Secondary | ICD-10-CM | POA: Diagnosis present

## 2022-12-09 DIAGNOSIS — I160 Hypertensive urgency: Secondary | ICD-10-CM | POA: Diagnosis present

## 2022-12-09 DIAGNOSIS — R918 Other nonspecific abnormal finding of lung field: Secondary | ICD-10-CM | POA: Diagnosis not present

## 2022-12-09 DIAGNOSIS — R579 Shock, unspecified: Secondary | ICD-10-CM | POA: Diagnosis not present

## 2022-12-09 DIAGNOSIS — Z79899 Other long term (current) drug therapy: Secondary | ICD-10-CM

## 2022-12-09 DIAGNOSIS — Z7951 Long term (current) use of inhaled steroids: Secondary | ICD-10-CM

## 2022-12-09 DIAGNOSIS — Z85118 Personal history of other malignant neoplasm of bronchus and lung: Secondary | ICD-10-CM

## 2022-12-09 DIAGNOSIS — Z7902 Long term (current) use of antithrombotics/antiplatelets: Secondary | ICD-10-CM

## 2022-12-09 DIAGNOSIS — F17210 Nicotine dependence, cigarettes, uncomplicated: Secondary | ICD-10-CM | POA: Diagnosis present

## 2022-12-09 DIAGNOSIS — Z9981 Dependence on supplemental oxygen: Secondary | ICD-10-CM

## 2022-12-09 DIAGNOSIS — Z9221 Personal history of antineoplastic chemotherapy: Secondary | ICD-10-CM

## 2022-12-09 DIAGNOSIS — Z923 Personal history of irradiation: Secondary | ICD-10-CM

## 2022-12-09 DIAGNOSIS — R0602 Shortness of breath: Secondary | ICD-10-CM | POA: Diagnosis not present

## 2022-12-09 DIAGNOSIS — R062 Wheezing: Secondary | ICD-10-CM | POA: Diagnosis not present

## 2022-12-09 DIAGNOSIS — Z4682 Encounter for fitting and adjustment of non-vascular catheter: Secondary | ICD-10-CM | POA: Diagnosis not present

## 2022-12-09 DIAGNOSIS — Z806 Family history of leukemia: Secondary | ICD-10-CM

## 2022-12-09 DIAGNOSIS — J90 Pleural effusion, not elsewhere classified: Secondary | ICD-10-CM | POA: Diagnosis not present

## 2022-12-09 LAB — CBC WITH DIFFERENTIAL/PLATELET
Abs Immature Granulocytes: 0.35 10*3/uL — ABNORMAL HIGH (ref 0.00–0.07)
Basophils Absolute: 0.1 10*3/uL (ref 0.0–0.1)
Basophils Relative: 0 %
Eosinophils Absolute: 0 10*3/uL (ref 0.0–0.5)
Eosinophils Relative: 0 %
HCT: 42 % (ref 39.0–52.0)
Hemoglobin: 11.5 g/dL — ABNORMAL LOW (ref 13.0–17.0)
Immature Granulocytes: 1 %
Lymphocytes Relative: 9 %
Lymphs Abs: 2.4 10*3/uL (ref 0.7–4.0)
MCH: 20 pg — ABNORMAL LOW (ref 26.0–34.0)
MCHC: 27.4 g/dL — ABNORMAL LOW (ref 30.0–36.0)
MCV: 72.9 fL — ABNORMAL LOW (ref 80.0–100.0)
Monocytes Absolute: 1.6 10*3/uL — ABNORMAL HIGH (ref 0.1–1.0)
Monocytes Relative: 6 %
Neutro Abs: 20.9 10*3/uL — ABNORMAL HIGH (ref 1.7–7.7)
Neutrophils Relative %: 84 %
Platelets: 536 10*3/uL — ABNORMAL HIGH (ref 150–400)
RBC: 5.76 MIL/uL (ref 4.22–5.81)
RDW: 21 % — ABNORMAL HIGH (ref 11.5–15.5)
WBC: 25.3 10*3/uL — ABNORMAL HIGH (ref 4.0–10.5)
nRBC: 0 % (ref 0.0–0.2)

## 2022-12-09 LAB — COMPREHENSIVE METABOLIC PANEL
ALT: 13 U/L (ref 0–44)
AST: 14 U/L — ABNORMAL LOW (ref 15–41)
Albumin: 3.3 g/dL — ABNORMAL LOW (ref 3.5–5.0)
Alkaline Phosphatase: 99 U/L (ref 38–126)
Anion gap: 12 (ref 5–15)
BUN: 16 mg/dL (ref 8–23)
CO2: 19 mmol/L — ABNORMAL LOW (ref 22–32)
Calcium: 8.8 mg/dL — ABNORMAL LOW (ref 8.9–10.3)
Chloride: 107 mmol/L (ref 98–111)
Creatinine, Ser: 1.7 mg/dL — ABNORMAL HIGH (ref 0.61–1.24)
GFR, Estimated: 44 mL/min — ABNORMAL LOW (ref >60)
Glucose, Bld: 178 mg/dL — ABNORMAL HIGH (ref 70–99)
Potassium: 4.6 mmol/L (ref 3.5–5.1)
Sodium: 138 mmol/L (ref 135–145)
Total Bilirubin: 0.6 mg/dL (ref 0.3–1.2)
Total Protein: 7.2 g/dL (ref 6.5–8.1)

## 2022-12-09 LAB — I-STAT CG4 LACTIC ACID, ED
Lactic Acid, Venous: 6.2 mmol/L (ref 0.5–1.9)
Lactic Acid, Venous: 3.6 mmol/L (ref 0.5–1.9)

## 2022-12-09 LAB — PROCALCITONIN: Procalcitonin: 1.61 ng/mL

## 2022-12-09 LAB — BRAIN NATRIURETIC PEPTIDE: B Natriuretic Peptide: 1307.3 pg/mL — ABNORMAL HIGH (ref 0.0–100.0)

## 2022-12-09 LAB — TROPONIN I (HIGH SENSITIVITY)
Troponin I (High Sensitivity): 518 ng/L (ref <18)
Troponin I (High Sensitivity): 636 ng/L (ref <18)

## 2022-12-09 MED ORDER — MAGNESIUM SULFATE 2 GM/50ML IV SOLN
2.0000 g | Freq: Once | INTRAVENOUS | Status: AC
  Administered 2022-12-09: 2 g via INTRAVENOUS
  Filled 2022-12-09: qty 50

## 2022-12-09 MED ORDER — POLYETHYLENE GLYCOL 3350 17 G PO PACK: 17.0000 g | PACK | Freq: Every day | ORAL | Status: DC | PRN

## 2022-12-09 MED ORDER — IPRATROPIUM-ALBUTEROL 0.5-2.5 (3) MG/3ML IN SOLN
3.0000 mL | Freq: Once | RESPIRATORY_TRACT | Status: AC
  Administered 2022-12-09: 3 mL via RESPIRATORY_TRACT
  Filled 2022-12-09: qty 3

## 2022-12-09 MED ORDER — ONDANSETRON HCL 4 MG/2ML IJ SOLN
4.0000 mg | Freq: Once | INTRAMUSCULAR | Status: AC
  Administered 2022-12-09: 4 mg via INTRAVENOUS
  Filled 2022-12-09: qty 2

## 2022-12-09 MED ORDER — FUROSEMIDE 10 MG/ML IJ SOLN
40.0000 mg | Freq: Once | INTRAMUSCULAR | Status: AC
  Administered 2022-12-09: 40 mg via INTRAVENOUS
  Filled 2022-12-09: qty 4

## 2022-12-09 MED ORDER — HYDRALAZINE HCL 25 MG PO TABS: 25.0000 mg | ORAL_TABLET | Freq: Three times a day (TID) | ORAL | Status: DC

## 2022-12-09 MED ORDER — ASPIRIN 81 MG PO CHEW: 324.0000 mg | CHEWABLE_TABLET | ORAL | Status: AC

## 2022-12-09 MED ORDER — METHYLPREDNISOLONE SODIUM SUCC 125 MG IJ SOLR
125.0000 mg | Freq: Once | INTRAMUSCULAR | Status: AC
  Administered 2022-12-09: 125 mg via INTRAVENOUS
  Filled 2022-12-09: qty 2

## 2022-12-09 MED ORDER — VANCOMYCIN HCL IN DEXTROSE 1-5 GM/200ML-% IV SOLN: 1000.0000 mg | INTRAVENOUS | Status: DC

## 2022-12-09 MED ORDER — OXYCODONE HCL 5 MG PO TABS: 5.0000 mg | ORAL_TABLET | Freq: Four times a day (QID) | ORAL | Status: DC | PRN

## 2022-12-09 MED ORDER — ARFORMOTEROL TARTRATE 15 MCG/2ML IN NEBU
15.0000 ug | INHALATION_SOLUTION | Freq: Two times a day (BID) | RESPIRATORY_TRACT | Status: DC
  Administered 2022-12-09 – 2022-12-11 (×4): 15 ug via RESPIRATORY_TRACT
  Filled 2022-12-09 (×4): qty 2

## 2022-12-09 MED ORDER — MORPHINE SULFATE (PF) 4 MG/ML IV SOLN
4.0000 mg | Freq: Once | INTRAVENOUS | Status: AC
  Administered 2022-12-09: 4 mg via INTRAVENOUS
  Filled 2022-12-09: qty 1

## 2022-12-09 MED ORDER — REVEFENACIN 175 MCG/3ML IN SOLN
175.0000 ug | Freq: Every day | RESPIRATORY_TRACT | Status: DC
  Administered 2022-12-10 – 2022-12-11 (×2): 175 ug via RESPIRATORY_TRACT
  Filled 2022-12-09 (×3): qty 3

## 2022-12-09 MED ORDER — NITROGLYCERIN IN D5W 200-5 MCG/ML-% IV SOLN
0.0000 ug/min | INTRAVENOUS | Status: DC
  Filled 2022-12-09: qty 250

## 2022-12-09 MED ORDER — VANCOMYCIN HCL 1500 MG/300ML IV SOLN
1500.0000 mg | Freq: Once | INTRAVENOUS | Status: AC
  Administered 2022-12-09: 1500 mg via INTRAVENOUS
  Filled 2022-12-09: qty 300

## 2022-12-09 MED ORDER — SODIUM CHLORIDE 0.9 % IV SOLN
2.0000 g | Freq: Two times a day (BID) | INTRAVENOUS | Status: DC
  Administered 2022-12-10: 2 g via INTRAVENOUS
  Filled 2022-12-09: qty 12.5

## 2022-12-09 MED ORDER — ASPIRIN 325 MG PO TBEC: 325.0000 mg | DELAYED_RELEASE_TABLET | Freq: Every day | ORAL | Status: DC

## 2022-12-09 MED ORDER — ASPIRIN 325 MG PO TABS
325.0000 mg | ORAL_TABLET | Freq: Once | ORAL | Status: AC
  Administered 2022-12-09: 325 mg via ORAL
  Filled 2022-12-09: qty 1

## 2022-12-09 MED ORDER — HYDRALAZINE HCL 20 MG/ML IJ SOLN: 10.0000 mg | INTRAMUSCULAR | Status: DC | PRN

## 2022-12-09 MED ORDER — HEPARIN BOLUS VIA INFUSION
4000.0000 [IU] | Freq: Once | INTRAVENOUS | Status: AC
  Administered 2022-12-09: 4000 [IU] via INTRAVENOUS
  Filled 2022-12-09: qty 4000

## 2022-12-09 MED ORDER — LEVOTHYROXINE SODIUM 112 MCG PO TABS
112.0000 ug | ORAL_TABLET | Freq: Every day | ORAL | Status: DC
  Administered 2022-12-10 – 2022-12-11 (×2): 112 ug via ORAL
  Filled 2022-12-09 (×2): qty 1

## 2022-12-09 MED ORDER — METHYLPREDNISOLONE SODIUM SUCC 125 MG IJ SOLR
80.0000 mg | Freq: Two times a day (BID) | INTRAMUSCULAR | Status: DC
  Administered 2022-12-10 (×2): 80 mg via INTRAVENOUS
  Filled 2022-12-09 (×3): qty 2

## 2022-12-09 MED ORDER — ASPIRIN 81 MG PO TBEC
81.0000 mg | DELAYED_RELEASE_TABLET | Freq: Every day | ORAL | Status: DC
  Administered 2022-12-10: 81 mg via ORAL
  Filled 2022-12-09: qty 1

## 2022-12-09 MED ORDER — HEPARIN (PORCINE) 25000 UT/250ML-% IV SOLN
1050.0000 [IU]/h | INTRAVENOUS | Status: DC
  Administered 2022-12-09: 850 [IU]/h via INTRAVENOUS
  Filled 2022-12-09: qty 250

## 2022-12-09 MED ORDER — ONDANSETRON HCL 4 MG/2ML IJ SOLN: 4.0000 mg | Freq: Four times a day (QID) | INTRAMUSCULAR | Status: DC | PRN

## 2022-12-09 MED ORDER — ACETAMINOPHEN 325 MG PO TABS: 650.0000 mg | ORAL_TABLET | ORAL | Status: DC | PRN

## 2022-12-09 MED ORDER — CLOPIDOGREL BISULFATE 75 MG PO TABS
75.0000 mg | ORAL_TABLET | Freq: Every day | ORAL | Status: DC
  Administered 2022-12-10: 75 mg via ORAL
  Filled 2022-12-09 (×2): qty 1

## 2022-12-09 MED ORDER — DOCUSATE SODIUM 100 MG PO CAPS: 100.0000 mg | ORAL_CAPSULE | Freq: Two times a day (BID) | ORAL | Status: DC | PRN

## 2022-12-09 MED ORDER — ALBUTEROL SULFATE (2.5 MG/3ML) 0.083% IN NEBU: 2.5000 mg | INHALATION_SOLUTION | RESPIRATORY_TRACT | Status: DC | PRN

## 2022-12-09 MED ORDER — SODIUM CHLORIDE 0.9 % IV SOLN
2.0000 g | Freq: Once | INTRAVENOUS | Status: AC
  Administered 2022-12-09: 2 g via INTRAVENOUS
  Filled 2022-12-09: qty 12.5

## 2022-12-09 MED ORDER — BUDESONIDE 0.5 MG/2ML IN SUSP
0.5000 mg | Freq: Two times a day (BID) | RESPIRATORY_TRACT | Status: DC
  Administered 2022-12-09 – 2022-12-11 (×4): 0.5 mg via RESPIRATORY_TRACT
  Filled 2022-12-09 (×4): qty 2

## 2022-12-09 MED ORDER — ASPIRIN 300 MG RE SUPP: 300.0000 mg | RECTAL | Status: AC

## 2022-12-09 NOTE — Progress Notes (Signed)
Pharmacy Antibiotic Note  [name] [name] is a 67 y.o. male admitted on 12/12/2022 with pneumonia.  Pharmacy has been consulted for Cefepime + Vancomycin dosing.  Plan: Cefepime 2gm IV q12h Vancomycin 1gm IV q24h to target AUC 400-500.  Estimated AUC on this regimen using current parameters = 495.  Check MRSA PCR Monitor renal function and cx data   Height: 5\' 9"  (175.3 cm) Weight: 71.2 kg (156 lb 15.5 oz) IBW/kg (Calculated) : 70.7  Temp (24hrs), Avg:97.6 F (36.4 C), Min:97.6 F (36.4 C), Max:97.6 F (36.4 C)  Recent Labs  Lab 12/01/2022 1829 12/28/2022 1911 12/08/2022 2031  WBC 25.3*  --   --   CREATININE 1.70*  --   --   LATICACIDVEN  --  6.2* 3.6*    Estimated Creatinine Clearance: 42.2 mL/min (A) (by C-G formula based on SCr of 1.7 mg/dL (H)).    Allergies  Allergen Reactions   Codeine Nausea Only    Antimicrobials this admission: 10/10 Cefepime >>  10/10 Vancomycin >>   Dose adjustments this admission:  Microbiology results: 10/10 BCx:  MRSA PCR:  Resp PCR:  Thank you for allowing pharmacy to be a part of this patient's care.  Junita Push PharmD 12/26/2022 11:28 PM

## 2022-12-09 NOTE — ED Triage Notes (Signed)
Patient came in for evaluation of shortness of breath. Reports this started this morning with diaphoresis. Audible wheezing on initial assessment. Also reports back of neck hurting.

## 2022-12-09 NOTE — ED Notes (Signed)
ED TO INPATIENT HANDOFF REPORT  Name/Age/Gender [name] [name] 67 y.o. male  Code Status    Code Status Orders  (From admission, onward)           Start     Ordered   January 06, 2023 2312  Full code  Continuous       Question:  By:  Answer:  Consent: discussion documented in EHR   06-Jan-2023 2314           Code Status History     Date Active Date Inactive Code Status Order ID Comments User Context   07/20/2021 1441 07/28/2021 2332 Full Code [id number]  Rowe Clack, PA-C Inpatient   03/03/2021 0324 03/06/2021 2120 Full Code [id number]  Garth Schlatter Inpatient   02/11/2017 1128 02/12/2017 1959 Full Code [id number]  Ihor Gully, MD Inpatient   01/18/2017 0840 01/18/2017 1421 Full Code [id number]  Lennette Bihari, MD Inpatient   11/14/2016 1040 11/16/2016 1743 Full Code [id number]  Eston Esters, MD ED   06/22/2015 2327 06/25/2015 1513 Full Code [id number]  Hillary Bow, DO ED       Home/SNF/Other Home  Chief Complaint Acute respiratory failure with hypoxia and hypercapnia (HCC) [J96.01, J96.02]  Level of Care/Admitting Diagnosis ED Disposition     ED Disposition  Admit   Condition  --   Comment  Hospital Area: [facility] [[id number]]  Level of Care: ICU [6]  May admit patient to [facility] or [facility] if equivalent level of care is available:: Yes  Covid Evaluation: Symptomatic Person Under Investigation (PUI) or recent exposure (last 10 days) *Testing Required*  Diagnosis: Acute respiratory failure with hypoxia and hypercapnia [facility]) [[id number]]  Admitting Physician: Oretha Milch [[id number]]  Attending Physician: Oretha Milch [[id number]]  Certification:: I certify this patient will need inpatient services for at least 2 midnights  Expected Medical Readiness: 12/13/2022          Medical History Past Medical History:  Diagnosis Date   Arthritis    Chronic right ear pain 07/2019   Coronary artery disease    Depression 06/12/11   Buproprion per  Dr. [name]   Drainage from ear, right 07/2019   Dyspnea    Full dentures    Fitting Per Dr. 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 of Cisplatin and Dhydrtion   Skin rash 01/2019   Smoking 06/12/11   1/2 pack/day   Status post chemotherapy 10/30/10 - 11/09/10    2 doses of Q 3 week Cisplatin   Status post radiation therapy 10/11/10 - 12/24/10   Bilateral Neck and Mucosa axis /  70 gray in 35 fractions   Xerostomia     Allergies Allergies  Allergen Reactions   Codeine Nausea Only    IV Location/Drains/Wounds Patient Lines/Drains/Airways Status     Active Line/Drains/Airways     Name Placement date Placement time Site Days   Peripheral IV 06-Jan-2023 20 G Left Antecubital 06-Jan-2023  1834  Antecubital  less than 1   Peripheral IV 2023-01-06 20 G Posterior;Right Hand 2023/01/06  1846  Hand  less than 1   Incision (Closed) 03/02/21 Arm Right 03/02/21  2144  -- 647   Incision (Closed) 07/20/21 Chest Left 07/20/21  1301  -- 507  Labs/Imaging Results for orders placed or performed during the hospital encounter of 12/05/2022 (from the past 48 hour(s))  CBC with Differential     Status: Abnormal   Collection Time: 12/08/2022  6:29 PM  Result Value Ref Range   WBC 25.3 (H) 4.0 - 10.5 K/uL   RBC 5.76 4.22 - 5.81 MIL/uL   Hemoglobin 11.5 (L) 13.0 - 17.0 g/dL   HCT 08.6 57.8 - 46.9 %   MCV 72.9 (L) 80.0 - 100.0 fL   MCH 20.0 (L) 26.0 - 34.0 pg   MCHC 27.4 (L) 30.0 - 36.0 g/dL   RDW 62.9 (H) 52.8 - 41.3 %   Platelets 536 (H) 150 - 400 K/uL   nRBC 0.0 0.0 - 0.2 %   Neutrophils Relative % 84 %   Neutro Abs 20.9 (H) 1.7 - 7.7 K/uL   Lymphocytes Relative 9 %   Lymphs Abs 2.4 0.7 - 4.0 K/uL   Monocytes Relative 6 %   Monocytes Absolute 1.6 (H) 0.1 - 1.0 K/uL   Eosinophils Relative 0 %   Eosinophils  Absolute 0.0 0.0 - 0.5 K/uL   Basophils Relative 0 %   Basophils Absolute 0.1 0.0 - 0.1 K/uL   Immature Granulocytes 1 %   Abs Immature Granulocytes 0.35 (H) 0.00 - 0.07 K/uL    Comment: Performed at [facility], [address] [place], [place] [address]  Troponin I (High Sensitivity)     Status: Abnormal   Collection Time: 12/27/2022  6:29 PM  Result Value Ref Range   Troponin I (High Sensitivity) 518 (HH) <18 ng/L    Comment: CRITICAL RESULT CALLED TO, READ BACK BY AND VERIFIED WITH rn tj rivers at 1951 11/30/2022 [place] a (NOTE) Elevated high sensitivity troponin I (hsTnI) values and significant  changes across serial measurements may suggest ACS but many other  chronic and acute conditions are known to elevate hsTnI results.  Refer to the "Links" section for chest pain algorithms and additional  guidance. Performed at [facility], [address] [place], [place] [address]   Comprehensive metabolic panel     Status: Abnormal   Collection Time: 12/05/2022  6:29 PM  Result Value Ref Range   Sodium 138 135 - 145 mmol/L   Potassium 4.6 3.5 - 5.1 mmol/L   Chloride 107 98 - 111 mmol/L   CO2 19 (L) 22 - 32 mmol/L   Glucose, Bld 178 (H) 70 - 99 mg/dL    Comment: Glucose reference range applies only to samples taken after fasting for at least 8 hours.   BUN 16 8 - 23 mg/dL   Creatinine, Ser 3.66 (H) 0.61 - 1.24 mg/dL   Calcium 8.8 (L) 8.9 - 10.3 mg/dL   Total Protein 7.2 6.5 - 8.1 g/dL   Albumin 3.3 (L) 3.5 - 5.0 g/dL   AST 14 (L) 15 - 41 U/L   ALT 13 0 - 44 U/L   Alkaline Phosphatase 99 38 - 126 U/L   Total Bilirubin 0.6 0.3 - 1.2 mg/dL   GFR, Estimated 44 (L) >60 mL/min    Comment: (NOTE) Calculated using the CKD-EPI Creatinine Equation (2021)    Anion gap 12 5 - 15    Comment: Performed at [facility], [address], [place], [place] [address]  Brain natriuretic peptide     Status: Abnormal   Collection Time:  12/29/2022  6:29 PM  Result Value Ref Range   B Natriuretic Peptide 1,307.3 (H) 0.0 -  100.0 pg/mL    Comment: Performed at [facility], [address] [place], [place] [address]  Blood gas, arterial     Status: Abnormal   Collection Time: 12/02/2022  6:49 PM  Result Value Ref Range   FIO2 4 %   pH, Arterial 7.07 (LL) 7.35 - 7.45    Comment: CRITICAL RESULT CALLED TO, READ BACK BY AND VERIFIED WITH: RN L SINCLAIR AT 1911 12/26/2022 CRU[place]ANK A    pCO2 arterial 59 (H) 32 - 48 mmHg   pO2, Arterial 206 (H) 83 - 108 mmHg   Bicarbonate 17.1 (L) 20.0 - 28.0 mmol/L   Acid-base deficit 13.2 (H) 0.0 - 2.0 mmol/L   O2 Saturation 99.4 %   Patient temperature 37.0    Collection site LEFT RADIAL    Drawn by [id number]    Allens test (pass/fail) PASS PASS    Comment: Performed at [facility], [address] [place], [place] [address]  I-Stat Lactic Acid     Status: Abnormal   Collection Time: 12/14/2022  7:11 PM  Result Value Ref Range   Lactic Acid, Venous 6.2 (HH) 0.5 - 1.9 mmol/L   Comment NOTIFIED PHYSICIAN   Troponin I (High Sensitivity)     Status: Abnormal   Collection Time: 12/21/2022  8:30 PM  Result Value Ref Range   Troponin I (High Sensitivity) 636 (HH) <18 ng/L    Comment: CRITICAL VALUE NOTED. VALUE IS CONSISTENT WITH PREVIOUSLY REPORTED/CALLED VALUE (NOTE) Elevated high sensitivity troponin I (hsTnI) values and significant  changes across serial measurements may suggest ACS but many other  chronic and acute conditions are known to elevate hsTnI results.  Refer to the "Links" section for chest pain algorithms and additional  guidance. Performed at [facility], [address], [place], [place] [address]   I-Stat Lactic Acid     Status: Abnormal   Collection Time: 12/15/2022  8:31 PM  Result Value Ref Range   Lactic Acid, Venous 3.6 (HH) 0.5 - 1.9 mmol/L   Comment NOTIFIED PHYSICIAN    DG Chest Port 1 View  Result Date:  12/15/2022 CLINICAL DATA:  Shortness of breath.  Wheezing. EXAM: PORTABLE CHEST 1 VIEW COMPARISON:  08/04/2021 FINDINGS: Moderate to large left pleural effusion with obscuration of the left hemidiaphragm. Asymmetric interstitial and airspace opacity in the right lung with indistinctness of pulmonary vasculature especially on the right. A right subclavian stent is noted. Atherosclerotic calcification of the aortic arch. Heart size within normal limits. IMPRESSION: 1. Moderate to large left pleural effusion. 2. Asymmetric interstitial and airspace opacity in the right lung with indistinctness of pulmonary vasculature especially on the right. Possibilities include asymmetric edema, asymmetric atypical pneumonia, or pulmonary embolus. Electronically Signed   By: [name] M.D.   On: 11/30/2022 20:20    Pending Labs Unresulted Labs (From admission, onward)     Start     Ordered   12/05/2022 0500  CBC  Daily,   R     Comments: While on heparin drip    12/19/2022 2023   12/04/2022 0500  Heparin level (unfractionated)  Once-Timed,   URGENT        12/07/2022 2106   12/13/2022 0500  Basic metabolic panel  Tomorrow morning,   R        12/06/2022 2314   12/21/2022 0500  Blood gas, arterial  Tomorrow morning,   R        12/15/2022 2314   12/17/2022 0500  Magnesium  Tomorrow morning,   R        12/12/2022 2314   Jan 03, 2023 0500  Phosphorus  Tomorrow morning,   R        12/17/2022 2314   12/25/2022 2329  MRSA Next Gen by PCR, Nasal  Once,   R        12/15/2022 2332   12/25/2022 2313  Procalcitonin  Add-on,   AD       References:    Procalcitonin Lower Respiratory Tract Infection AND Sepsis Procalcitonin Algorithm   12/04/2022 2314   12/23/2022 2250  Blood gas, arterial  Once,   R       Comments: Repeat - new blood draw please    12/07/2022 2250   12/08/2022 1829  Culture, blood (routine x 2)  BLOOD CULTURE X 2,   R (with STAT occurrences)      12/16/2022 1828   12/13/2022 1829  Resp panel by RT-PCR (RSV, Flu A&B, Covid) Anterior  Nasal Swab  Once,   URGENT        12/14/2022 1828            Vitals/Pain Today's Vitals   12/06/2022 2114 12/18/2022 2130 12/16/2022 2204 12/28/2022 2245  BP:  (!) 135/118  (!) 173/101  Pulse:  75  64  Resp:  (!) 25  (!) 25  Temp:   97.6 F (36.4 C)   TempSrc:   Axillary   SpO2:  98%  94%  Weight:      Height:      PainSc: 6        Isolation Precautions No active isolations  Medications Medications  nitroGLYCERIN 50 mg in dextrose 5 % 250 mL (0.2 mg/mL) infusion (0 mcg/min Intravenous Hold 12/21/2022 1944)  heparin ADULT infusion 100 units/mL (25000 units/26mL) (850 Units/hr Intravenous New Bag/Given 12/05/2022 2049)  docusate sodium (COLACE) capsule 100 mg (has no administration in time range)  polyethylene glycol (MIRALAX / GLYCOLAX) packet 17 g (has no administration in time range)  acetaminophen (TYLENOL) tablet 650 mg (has no administration in time range)  ondansetron (ZOFRAN) injection 4 mg (has no administration in time range)  albuterol (PROVENTIL) (2.5 MG/3ML) 0.083% nebulizer solution 2.5 mg (has no administration in time range)  aspirin chewable tablet 324 mg (324 mg Oral Not Given 12/13/2022 2318)    Or  aspirin suppository 300 mg ( Rectal See Alternative 12/14/2022 2318)  budesonide (PULMICORT) nebulizer solution 0.5 mg (0.5 mg Nebulization Given 12/03/2022 2345)  arformoterol (BROVANA) nebulizer solution 15 mcg (15 mcg Nebulization Given 12/27/2022 2345)  revefenacin (YUPELRI) nebulizer solution 175 mcg (has no administration in time range)  methylPREDNISolone sodium succinate (SOLU-MEDROL) 125 mg/2 mL injection 80 mg (has no administration in time range)  hydrALAZINE (APRESOLINE) injection 10-40 mg (has no administration in time range)  clopidogrel (PLAVIX) tablet 75 mg (has no administration in time range)  oxyCODONE (Oxy IR/ROXICODONE) immediate release tablet 5 mg (has no administration in time range)  levothyroxine (SYNTHROID) tablet 112 mcg (has no administration in time  range)  hydrALAZINE (APRESOLINE) tablet 25 mg (has no administration in time range)  aspirin EC tablet 81 mg (has no administration in time range)  ceFEPIme (MAXIPIME) 2 g in sodium chloride 0.9 % 100 mL IVPB (has no administration in time range)  vancomycin (VANCOCIN) IVPB 1000 mg/200 mL premix (has no administration in time range)  ipratropium-albuterol (DUONEB) 0.5-2.5 (3) MG/3ML nebulizer solution 3 mL (3 mLs Nebulization Given 12/17/2022 1835)  methylPREDNISolone sodium succinate (SOLU-MEDROL) 125  mg/2 mL injection 125 mg (125 mg Intravenous Given 12/29/2022 1835)  magnesium sulfate IVPB 2 g 50 mL (0 g Intravenous Stopped 12/13/2022 1937)  ceFEPIme (MAXIPIME) 2 g in sodium chloride 0.9 % 100 mL IVPB (0 g Intravenous Stopped 12/20/2022 2015)  vancomycin (VANCOREADY) IVPB 1500 mg/300 mL (0 mg Intravenous Stopped 12/15/2022 2139)  furosemide (LASIX) injection 40 mg (40 mg Intravenous Given 12/22/2022 1917)  aspirin tablet 325 mg (325 mg Oral Given 12/17/2022 2049)  heparin bolus via infusion 4,000 Units (4,000 Units Intravenous Bolus from Bag 12/18/2022 2049)  morphine (PF) 4 MG/ML injection 4 mg (4 mg Intravenous Given 12/05/2022 2045)  ondansetron (ZOFRAN) injection 4 mg (4 mg Intravenous Given 12/26/2022 2044)    Mobility walks with person assist

## 2022-12-09 NOTE — ED Provider Notes (Signed)
[place] [facility] AT [facility] Provider Note   CSN: [id number] Arrival date & time: 12/26/2022  1803     History  Chief Complaint  Patient presents with   Shortness of Breath    [name] [name] is a 67 y.o. male.  67 year old with prior medical history as detailed presents for evaluation.  Patient reports increased wheezing and shortness of breath times approximately 24 hours.  Patient reports intermittent use of supplemental O2 at night.  Patient with longstanding history of COPD.  Patient reports increased cough and congestion x 12 hours.  Patient reported using his oxygen at home without improvement in his symptoms.  He denies fever.  He denies chest pain.    The history is provided by the patient and medical records.       Home Medications Prior to Admission medications   Medication Sig Start Date End Date Taking? Authorizing Provider  albuterol (VENTOLIN HFA) 108 (90 Base) MCG/ACT inhaler INHALE 2 PUFFS INTO THE LUNGS EVERY 4 (FOUR) HOURS AS NEEDED FOR WHEEZING OR SHORTNESS OF BREATH (COUGH, SHORTNESS OF BREATH OR WHEEZING.). 08/18/22 08/18/23  Ivonne Andrew, NP  ASPIRIN LOW DOSE 81 MG tablet TAKE 1 TABLET(81 MG) BY MOUTH DAILY. SWALLOW WHOLE 03/16/22   Chuck Hint, MD  atorvastatin (LIPITOR) 40 MG tablet Take 1 tablet (40 mg total) by mouth daily. 11/24/22   Ivonne Andrew, NP  Budeson-Glycopyrrol-Formoterol (BREZTRI AEROSPHERE) 160-9-4.8 MCG/ACT AERO Inhale 2 puffs into the lungs in the morning and at bedtime. Patient taking differently: Inhale 2 puffs into the lungs every morning. 08/04/21   Leslye Peer, MD  budesonide-formoterol (SYMBICORT) 80-4.5 MCG/ACT inhaler INHALE 2 PUFFS INTO THE LUNGS 2 (TWO) TIMES DAILY. 08/18/22 08/18/23  Ivonne Andrew, NP  clopidogrel (PLAVIX) 75 MG tablet TAKE 1 TABLET(75 MG) BY MOUTH DAILY 11/22/22   Cephus Shelling, MD  docusate sodium (COLACE) 100 MG capsule Take 100 mg by mouth at bedtime.     [provider]  escitalopram (LEXAPRO) 10 MG tablet Take 1 tablet (10 mg total) by mouth daily. 11/30/22   Donell Beers, FNP  hydrALAZINE (APRESOLINE) 25 MG tablet Take 1 tablet (25 mg total) by mouth 3 (three) times daily. 11/30/22   Donell Beers, FNP  levothyroxine (SYNTHROID) 112 MCG tablet Take 1 tablet (112 mcg total) by mouth daily. 11/24/22 11/24/23  Ivonne Andrew, NP  metoprolol tartrate (LOPRESSOR) 25 MG tablet Take 1 tablet (25 mg total) by mouth 2 (two) times daily. 11/24/22   Ivonne Andrew, NP  oxybutynin (DITROPAN) 5 MG tablet Take 1 tablet (5 mg total) by mouth 3 (three)  times daily as needed (bladder spasm/leakage around catheter). 09/16/21     oxyCODONE (OXY IR/ROXICODONE) 5 MG immediate release tablet Take 1 tablet (5 mg total) by mouth every 6 (six) hours as needed for moderate pain or severe pain. 11/30/22   Paseda, Baird Kay, FNP  Vitamin D, Ergocalciferol, (DRISDOL) 1.25 MG (50000 UNIT) CAPS capsule TAKE 1 CAPSULE BY MOUTH EVERY 7 DAYS. 11/24/22 11/24/23  Ivonne Andrew, NP      Allergies    Codeine    Review of Systems   Review of Systems  All other systems reviewed and are negative.   Physical Exam Updated Vital Signs BP (!) 173/159   Pulse (!) 112   Resp (!) 30   Ht 5\' 9"  (1.753 m)   Wt 71.2 kg   SpO2 99%   BMI 23.18 kg/m  Physical Exam Vitals and nursing note reviewed.  Constitutional:      General: He is in acute distress.     Appearance: Normal appearance. He is ill-appearing and diaphoretic.  HENT:     Head: Normocephalic and atraumatic.  Eyes:     Conjunctiva/sclera: Conjunctivae normal.     Pupils: Pupils are equal, round, and reactive to light.  Cardiovascular:     Rate and Rhythm: Regular rhythm. Tachycardia present.     Heart sounds: Normal heart sounds.  Pulmonary:     Effort: Tachypnea and respiratory distress present.     Comments: Diffuse expiratory wheezes in all lung fields Abdominal:     General: There is  no distension.     Palpations: Abdomen is soft.     Tenderness: There is no abdominal tenderness.  Musculoskeletal:        General: No deformity. Normal range of motion.     Cervical back: Normal range of motion and neck supple.  Skin:    General: Skin is warm.     Coloration: Skin is pale.     Comments: Diaphoretic  Neurological:     General: No focal deficit present.     Mental Status: He is alert and oriented to person, place, and time.     ED Results / Procedures / Treatments   Labs (all labs ordered are listed, but only abnormal results are displayed) Labs Reviewed  CBC WITH DIFFERENTIAL/PLATELET - Abnormal; Notable for the following components:      Result Value   WBC 25.3 (*)    Hemoglobin 11.5 (*)    MCV 72.9 (*)    MCH 20.0 (*)    MCHC 27.4 (*)    RDW 21.0 (*)    Platelets 536 (*)    Neutro Abs 20.9 (*)    Monocytes Absolute 1.6 (*)    Abs Immature Granulocytes 0.35 (*)    All other components within normal limits  COMPREHENSIVE METABOLIC PANEL - Abnormal; Notable for the following components:   CO2 19 (*)    Glucose, Bld 178 (*)    Creatinine, Ser 1.70 (*)    Calcium 8.8 (*)    Albumin 3.3 (*)    AST 14 (*)    GFR, Estimated 44 (*)    All other components within normal limits  BLOOD GAS, ARTERIAL - Abnormal; Notable for the following components:   pH, Arterial 7.07 (*)    pCO2 arterial 59 (*)    pO2, Arterial 206 (*)    Bicarbonate 17.1 (*)    Acid-base deficit 13.2 (*)    All other components within normal limits  I-STAT CG4 LACTIC ACID, ED - Abnormal; Notable for the following components:   Lactic Acid, Venous 6.2 (*)    All other components within normal limits  TROPONIN I (HIGH SENSITIVITY) - Abnormal; Notable for the following components:   Troponin I (High Sensitivity) 518 (*)    All other components within normal limits  CULTURE, BLOOD (ROUTINE X 2)  CULTURE, BLOOD (ROUTINE X 2)  RESP PANEL BY RT-PCR (RSV, FLU A&B, COVID)  RVPGX2  BRAIN  NATRIURETIC PEPTIDE  I-STAT CG4 LACTIC ACID, ED  TROPONIN I (HIGH SENSITIVITY)    EKG EKG Interpretation Date/Time:  Thursday December 09 2022 19:27:55 EDT Ventricular Rate:  105 PR Interval:  181 QRS Duration:  124 QT Interval:  370 QTC Calculation: 489 R Axis:   97  Text Interpretation: Sinus tachycardia Biatrial enlargement Consider left ventricular hypertrophy Repol abnrm suggests  ischemia, diffuse leads Confirmed by Kristine Royal [phone]) on 12/12/2022 7:31:02 PM  Radiology No results found.  Procedures Procedures    Medications Ordered in ED Medications  vancomycin (VANCOREADY) IVPB 1500 mg/300 mL (1,500 mg Intravenous New Bag/Given 12/03/2022 1939)  nitroGLYCERIN 50 mg in dextrose 5 % 250 mL (0.2 mg/mL) infusion (0 mcg/min Intravenous Hold 12/20/2022 1944)  aspirin tablet 325 mg (has no administration in time range)  ipratropium-albuterol (DUONEB) 0.5-2.5 (3) MG/3ML nebulizer solution 3 mL (3 mLs Nebulization Given 12/17/2022 1835)  methylPREDNISolone sodium succinate (SOLU-MEDROL) 125 mg/2 mL injection 125 mg (125 mg Intravenous Given 12/17/2022 1835)  magnesium sulfate IVPB 2 g 50 mL (0 g Intravenous Stopped 12/21/2022 1937)  ceFEPIme (MAXIPIME) 2 g in sodium chloride 0.9 % 100 mL IVPB (2 g Intravenous New Bag/Given 12/30/2022 1925)  furosemide (LASIX) injection 40 mg (40 mg Intravenous Given 12/01/2022 1917)    ED Course/ Medical Decision Making/ A&P                                 Medical Decision Making Amount and/or Complexity of Data Reviewed Labs: ordered. Radiology: ordered.  Risk OTC drugs. Prescription drug management. Decision regarding hospitalization.    Medical Screen Complete  This patient presented to the ED with complaint of sob.  This complaint involves an extensive number of treatment options. The initial differential diagnosis includes, but is not limited to, CHF exacerbation, COPD exacerbation, ACS, NSTEMI, metabolic abnormality, etc.  This  presentation is: Acute, Chronic, Self-Limited, Previously Undiagnosed, Uncertain Prognosis, Complicated, Systemic Symptoms, and Threat to Life/Bodily Function  Patient presents with significant respiratory distress.  On arrival patient is diaphoretic, tachypneic, hypoxic.  Patient denies fever, chest pain.  Patient placed on BiPAP and given bronchodilators, Solu-Medrol, magnesium.  Chest x-ray is suggestive of congestive heart failure.  40 of Lasix given.   Patient with significant tachycardia and hypertension initially.  Nitroglycerin gtt ordered but held secondary to return of blood pressure back to more normotensive levels without specific intervention.  Broad spectrum antibiotics administered.   EKG without ST elevation.  Troponin is 518 initially.  EKG and case reviewed with Dr. Wyline Mood of Cardiology.  She recommends initiation of heparin gtt, asa.  Cardiology will follow.  Critical Care Vassie Loll) service is aware of case and will evaluate.    Additional history obtained:  External records from outside sources obtained and reviewed including prior ED visits and prior Inpatient records.    Lab Tests:  I ordered and personally interpreted labs.  The pertinent results include: CBC, CMP, BNP, troponin, ABG, lactic acid   Imaging Studies ordered:  I ordered imaging studies including chest x-ray I independently visualized and interpreted obtained imaging which showed likely CHF I agree with the radiologist interpretation.   Cardiac Monitoring:  The patient was maintained on a cardiac monitor.  I personally viewed and interpreted the cardiac monitor which showed an underlying rhythm of: Sinus tach   Medicines ordered:  I ordered medication including antibiotics, bronchodilators, Lasix, heparin, steroids for CHF exacerbation, COPD exacerbation, pneumonia Reevaluation of the patient after these medicines showed that the patient: improved    Problem List / ED  Course:  Dyspnea   Reevaluation:  After the interventions noted above, I reevaluated the patient and found that they have: improved   Disposition:  After consideration of the diagnostic results and the patients response to treatment, I feel that the patent would benefit from admission.  CRITICAL CARE Performed by: Wynetta Fines   Total critical care time: 30 minutes  Critical care time was exclusive of separately billable procedures and treating other patients.  Critical care was necessary to treat or prevent imminent or life-threatening deterioration.  Critical care was time spent personally by me on the following activities: development of treatment plan with patient and/or surrogate as well as nursing, discussions with consultants, evaluation of patient's response to treatment, examination of patient, obtaining history from patient or surrogate, ordering and performing treatments and interventions, ordering and review of laboratory studies, ordering and review of radiographic studies, pulse oximetry and re-evaluation of patient's condition.          Final Clinical Impression(s) / ED Diagnoses Final diagnoses:  Dyspnea, unspecified type    Rx / DC Orders ED Discharge Orders     None         Wynetta Fines, MD 12/08/2022 2315

## 2022-12-09 NOTE — H&P (Signed)
NAME:  [name] [name], MRN:  [id number], DOB:  11-15-1955, LOS: 0 ADMISSION DATE:  2023-01-05, CONSULTATION DATE:  01/05/2023  REFERRING MD:  Rodena Medin, EDP , CHIEF COMPLAINT: Respiratory distress on BiPAP  History of Present Illness:  67 year old half pack per day smoker with COPD presented with respiratory distress, increased shortness of breath and wheezing for 24 hours with sudden onset.  No preceding URI symptoms.  He used his home Symbicort and albuterol without benefit. Initial ABG was 7.0 7/59/206 , placed on BiPAP Chest x-ray showed interstitial asymmetric airspace disease in the right lung more than left with volume loss and elevated left hemidiaphragm consistent with left lobectomy, cannot rule out effusion BNP was high at 03/03/2005, troponins flat 518 and 636.  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 rate (!) 25, height 5\' 9"  (1.753 m), weight 71.2 kg, SpO2 94%.    FiO2 (%):  [60 %-100 %] 60 %  No intake or output data in the 24 hours ending 01/05/2023 2255 Filed Weights   2023/01/05 1821  Weight: 71.2 kg    Examination: General: Well-built, thin man, no distress on BiPAP HENT: No pallor, icterus, no JVD Lungs: Bilateral diffuse rhonchi, crackles right base, no accessory muscle use Cardiovascular: S1-S2 regular, 60s, no  murmur Abdomen: Soft, nontender abdomen, no paraspinal megaly Extremities: No deformity, no edema Neuro: Alert, interactive, nonfocal, no asterixis  EKG shows severe LVH with T wave inversion in inferolateral leads  Resolved Hospital Problem list     Assessment & Plan:  Acute hypercarbic respiratory failure -bronchospasm on exam consistent with COPD exacerbation. However chest x-ray and increased BNP with sudden onset seem to suggest asymmetric acute pulmonary edema .  There is some risk for aspiration given prior history of oropharyngeal cancer and radiation.  He appears to be much improved on BiPAP , but still requires 60% FiO2, will admit to ICU  -IV Solu-Medrol 80 Q12 -Budesonide, Brovana, Yupelri with albuterol as needed for breakthrough -Empiric cefepime, vancomycin, obtain urine strep antigen, if chest x-ray clears, antibiotics can be discontinued -Lasix 40 mg given and will watch urine output and redose -Await COVID testing  Elevated troponins -Trend troponins and obtain echo -Aspirin -If rises higher, can consider heparin -Hold metoprolol given bronchospasm  Hypertensive urgency -use IV hydralazine and levo 4 oh.   Best Practice (right click and "Reselect all SmartList Selections" daily)   Diet/type: NPO DVT prophylaxis: LMWH GI prophylaxis: N/A Lines: N/A Foley:  N/A Code Status:  full code Last date of multidisciplinary goals of care discussion [NA]  Labs   CBC: Recent Labs  Lab 2023-01-05 1829  WBC 25.3*  NEUTROABS 20.9*  HGB 11.5*  HCT 42.0  MCV 72.9*  PLT 536*    Basic Metabolic Panel: Recent Labs  Lab 05-Jan-2023 1829  NA 138  K 4.6  CL 107  CO2 19*  GLUCOSE 178*  BUN 16  CREATININE 1.70*  CALCIUM 8.8*   GFR: Estimated Creatinine Clearance: 42.2 mL/min (A) (by C-G formula based on SCr of 1.7 mg/dL (H)). Recent Labs  Lab 2022/12/31 1829 12-31-22 1911 12-31-22 2031  WBC 25.3*  --   --   LATICACIDVEN  --  6.2* 3.6*    Liver Function  Tests: Recent Labs  Lab 2022-12-31 1829  AST 14*  ALT 13  ALKPHOS 99  BILITOT 0.6  PROT 7.2  ALBUMIN 3.3*   No results for input(s): "LIPASE", "AMYLASE" in the last 168 hours. No results for input(s): "AMMONIA" in the last 168 hours.  ABG    Component Value Date/Time   PHART 7.07 (LL) 12-31-22 1849   PCO2ART 59 (H) 12-31-2022 1849   PO2ART 206 (H) December 31, 2022 1849   HCO3 17.1 (L) 12/31/22 1849   TCO2 17 (L) 03/02/2021 1811   ACIDBASEDEF 13.2 (H) 2022/12/31 1849   O2SAT 99.4 December 31, 2022 1849     Coagulation Profile: No results for input(s): "INR", "PROTIME" in the last 168 hours.  Cardiac Enzymes: No results for input(s): "CKTOTAL", "CKMB", "CKMBINDEX", "TROPONINI" in the last 168 hours.  HbA1C: Hgb A1c MFr Bld  Date/Time Value Ref Range Status  06/16/2021 02:00 PM 5.3 4.8 - 5.6 % Final    Comment:             Prediabetes: 5.7 - 6.4          Diabetes: >6.4          Glycemic control for adults with diabetes: <7.0   05/23/2020 02:15 PM 5.6 4.8 - 5.6 % Final    Comment:             Prediabetes: 5.7 - 6.4          Diabetes: >6.4          Glycemic control for adults with diabetes: <7.0     CBG: No results for input(s): "GLUCAP" in the last 168 hours.  Review of Systems:   Sudden onset shortness of breath No fevers, preceding URI symptoms No sick contacts  Past Medical History:  He,  has a past medical history of Arthritis, Chronic right ear pain (07/2019), Coronary artery disease, Depression (06/12/11), Drainage from ear, right (07/2019), Dyspnea, Full dentures, Heart murmur, History of kidney stones, Hypertension, Hypothyroidism, Oropharyngeal cancer (HCC) (2012), Protein calorie malnutrition (HCC) (05/31/11), Renal insufficiency (2012), Skin rash (01/2019), Smoking (06/12/11), Status post chemotherapy (10/30/10 - 11/09/10), Status post radiation therapy (10/11/10 - 12/24/10), and Xerostomia.   Surgical History:   Past Surgical History:  Procedure Laterality Date    APPENDECTOMY     BIOPSY TONGUE     TonsilandBaseoftongue   BRONCHIAL BRUSHINGS  07/20/2021   Procedure: BRONCHIAL BRUSHINGS;  Surgeon: Leslye Peer, MD;  Location: [facility] ENDOSCOPY;  Service: Pulmonary;;   BRONCHIAL NEEDLE ASPIRATION BIOPSY  07/20/2021   Procedure: BRONCHIAL NEEDLE ASPIRATION BIOPSIES;  Surgeon: Leslye Peer, MD;  Location: [facility] ENDOSCOPY;  Service: Pulmonary;;   FIDUCIAL MARKER PLACEMENT  07/20/2021   Procedure: FIDUCIAL DYE MARKING;  Surgeon: Leslye Peer, MD;  Location: [facility] ENDOSCOPY;  Service: Pulmonary;;   HERNIA REPAIR     umbilical with mesh   INTERCOSTAL NERVE BLOCK Left 07/20/2021   Procedure: INTERCOSTAL NERVE BLOCK;  Surgeon: Corliss Skains, MD;  Location: [facility] OR;  Service: Thoracic;  Laterality: Left;   IR NEPHROSTOMY EXCHANGE LEFT  01/10/2017   IR NEPHROSTOMY PLACEMENT LEFT  11/15/2016   LEFT HEART CATH AND CORONARY ANGIOGRAPHY N/A 01/18/2017  Procedure: LEFT HEART CATH AND CORONARY ANGIOGRAPHY;  Surgeon: Lennette Bihari, MD;  Location: [facility] INVASIVE CV LAB;  Service: Cardiovascular;  Laterality: N/A;   LYMPH NODE BIOPSY Left 07/20/2021   Procedure: LYMPH NODE BIOPSY;  Surgeon: Corliss Skains, MD;  Location: [facility] OR;  Service: Thoracic;  Laterality: Left;   NEPHROLITHOTOMY Left 02/11/2017   Procedure: NEPHROLITHOTOMY PERCUTANEOUS;  Surgeon: Ihor Gully, MD;  Location: [facility] ORS;  Service: Urology;  Laterality: Left;   PEG TUBE REMOVAL  04/13/11   PERIPHERAL VASCULAR INTERVENTION Right 03/05/2021   Procedure: PERIPHERAL VASCULAR INTERVENTION;  Surgeon: Nada Libman, MD;  Location: [facility] INVASIVE CV LAB;  Service: Cardiovascular;  Laterality: Right;  right subclavian   THROMBECTOMY BRACHIAL ARTERY Right 03/02/2021   Procedure: THROMBECTOMY RIGHT SUBCLAVIAN, BRACHIAL, AXILLA, AND RADIAL ARTERY;  Surgeon: Cephus Shelling, MD;  Location: [facility] OR;  Service: Vascular;  Laterality: Right;   TONSILLECTOMY     right side   UPPER EXTREMITY ANGIOGRAPHY N/A 03/05/2021    Procedure: UPPER EXTREMITY ANGIOGRAPHY;  Surgeon: Nada Libman, MD;  Location: [facility] INVASIVE CV LAB;  Service: Cardiovascular;  Laterality: N/A;   VIDEO BRONCHOSCOPY WITH RADIAL ENDOBRONCHIAL ULTRASOUND  07/20/2021   Procedure: VIDEO BRONCHOSCOPY WITH RADIAL ENDOBRONCHIAL ULTRASOUND;  Surgeon: Leslye Peer, MD;  Location: [facility] ENDOSCOPY;  Service: Pulmonary;;     Social History:   reports that he has been smoking cigarettes. He has a 27.5 pack-year smoking history. He has never used smokeless tobacco. He reports that he does not drink alcohol and does not use drugs.   Family History:  His family history includes Cancer (age of onset: 9) in his sister; Cancer (age of onset: 55) in his father.   Allergies Allergies  Allergen Reactions   Codeine Nausea Only     Home Medications  Prior to Admission medications   Medication Sig Start Date End Date Taking? Authorizing Provider  albuterol (VENTOLIN HFA) 108 (90 Base) MCG/ACT inhaler INHALE 2 PUFFS INTO THE LUNGS EVERY 4 (FOUR) HOURS AS NEEDED FOR WHEEZING OR SHORTNESS OF BREATH (COUGH, SHORTNESS OF BREATH OR WHEEZING.). 08/18/22 08/18/23  Ivonne Andrew, NP  ASPIRIN LOW DOSE 81 MG tablet TAKE 1 TABLET(81 MG) BY MOUTH DAILY. SWALLOW WHOLE 03/16/22   Chuck Hint, MD  atorvastatin (LIPITOR) 40 MG tablet Take 1 tablet (40 mg total) by mouth daily. 11/24/22   Ivonne Andrew, NP  Budeson-Glycopyrrol-Formoterol (BREZTRI AEROSPHERE) 160-9-4.8 MCG/ACT AERO Inhale 2 puffs into the lungs in the morning and at bedtime. Patient taking differently: Inhale 2 puffs into the lungs every morning. 08/04/21   Leslye Peer, MD  budesonide-formoterol (SYMBICORT) 80-4.5 MCG/ACT inhaler INHALE 2 PUFFS INTO THE LUNGS 2 (TWO) TIMES DAILY. 08/18/22 08/18/23  Ivonne Andrew, NP  clopidogrel (PLAVIX) 75 MG tablet TAKE 1 TABLET(75 MG) BY MOUTH DAILY 11/22/22   Cephus Shelling, MD  docusate sodium (COLACE) 100 MG capsule Take 100 mg by mouth at bedtime.     [provider]  escitalopram (LEXAPRO) 10 MG tablet Take 1 tablet (10 mg total) by mouth daily. 11/30/22   Donell Beers, FNP  hydrALAZINE (APRESOLINE) 25 MG tablet Take 1 tablet (25 mg total) by mouth 3 (three) times daily. 11/30/22   Donell Beers, FNP  levothyroxine (SYNTHROID) 112 MCG tablet Take 1 tablet (112 mcg total) by mouth daily. 11/24/22 11/24/23  Ivonne Andrew, NP  metoprolol tartrate (LOPRESSOR) 25 MG tablet Take 1 tablet (25 mg total) by mouth 2 (two) times  daily. 11/24/22   Ivonne Andrew, NP  oxybutynin (DITROPAN) 5 MG tablet Take 1 tablet (5 mg total) by mouth 3 (three)  times daily as needed (bladder spasm/leakage around catheter). 09/16/21     oxyCODONE (OXY IR/ROXICODONE) 5 MG immediate release tablet Take 1 tablet (5 mg total) by mouth every 6 (six) hours as needed for moderate pain or severe pain. 11/30/22   Paseda, Baird Kay, FNP  Vitamin D, Ergocalciferol, (DRISDOL) 1.25 MG (50000 UNIT) CAPS capsule TAKE 1 CAPSULE BY MOUTH EVERY 7 DAYS. 11/24/22 11/24/23  Ivonne Andrew, NP     Critical care time: 34 m       Cyril Mourning MD. Tonny Bollman. [place] [facility] Pager : [phone]  If no response to pager , please call [phone] until 7 pm After 7:00 pm call Elink  [phone]   10-Dec-2022

## 2022-12-09 NOTE — Progress Notes (Signed)
A consult was received from an ED physician for cefepime & vancomycin per pharmacy dosing.  The patient's profile has been reviewed for ht/wt/allergies/indication/available labs.   A one time order has been placed for cefepime 2 gm & vancomycin 1500 mg.    Further antibiotics/pharmacy consults should be ordered by admitting physician if indicated.                       Thank you,  Herby Abraham, Pharm.D Use secure chat for questions 12/25/2022 7:01 PM

## 2022-12-09 NOTE — Progress Notes (Signed)
RT NOTE:  Pt placed on BiPAP, given Duoneb and ABG obtained per EDP orders.

## 2022-12-09 NOTE — Progress Notes (Signed)
PHARMACY - ANTICOAGULATION CONSULT NOTE  Pharmacy Consult for heparin  Indication: ACS/STEMI  Allergies  Allergen Reactions   Codeine Nausea Only    Patient Measurements: Height: 5\' 9"  (175.3 cm) Weight: 71.2 kg (156 lb 15.5 oz) IBW/kg (Calculated) : 70.7 Heparin Dosing Weight: 71.2 kg  Vital Signs: BP: 173/159 (10/10 1900) Pulse Rate: 112 (10/10 1900)  Labs: Recent Labs    12/20/2022 1829  HGB 11.5*  HCT 42.0  PLT 536*  CREATININE 1.70*  TROPONINIHS 518*    Estimated Creatinine Clearance: 42.2 mL/min (A) (by C-G formula based on SCr of 1.7 mg/dL (H)).   Medical History: Past Medical History:  Diagnosis Date   Arthritis    Chronic right ear pain 07/2019   Coronary artery disease    Depression 06/12/11   Buproprion per Dr. [name]   Drainage from ear, right 07/2019   Dyspnea    Full dentures    Fitting Per Dr. 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 of Cisplatin and Dhydrtion   Skin rash 01/2019   Smoking 06/12/11   1/2 pack/day   Status post chemotherapy 10/30/10 - 11/09/10    2 doses of Q 3 week Cisplatin   Status post radiation therapy 10/11/10 - 12/24/10   Bilateral Neck and Mucosa axis /  70 gray in 35 fractions   Xerostomia     Assessment: Pharmacy consulted to dose heparin for ACS/STEMI.  Hg 11.5, PLT 536, SCr 1.7, troponin 518 No anticoagulants PTA   Goal of Therapy:  Heparin level 0.3-0.7 units/ml Monitor platelets by anticoagulation protocol: Yes   Plan:  Heparin bolus 3000 units IV x 1  Heparin drip 850 units/hr Check 8 hr heparin level Daily CBC & heparin level   Herby Abraham, Pharm.D Use secure chat for questions 11/30/2022 8:20 PM

## 2022-12-10 ENCOUNTER — Inpatient Hospital Stay (HOSPITAL_COMMUNITY): Payer: Medicare PPO

## 2022-12-10 ENCOUNTER — Encounter (HOSPITAL_COMMUNITY): Admission: EM | Disposition: E | Payer: Self-pay | Source: Home / Self Care | Attending: Pulmonary Disease

## 2022-12-10 ENCOUNTER — Other Ambulatory Visit (HOSPITAL_COMMUNITY): Payer: Medicare PPO

## 2022-12-10 DIAGNOSIS — J9602 Acute respiratory failure with hypercapnia: Secondary | ICD-10-CM | POA: Diagnosis not present

## 2022-12-10 DIAGNOSIS — R570 Cardiogenic shock: Secondary | ICD-10-CM | POA: Diagnosis not present

## 2022-12-10 DIAGNOSIS — R9431 Abnormal electrocardiogram [ECG] [EKG]: Secondary | ICD-10-CM | POA: Diagnosis present

## 2022-12-10 DIAGNOSIS — I340 Nonrheumatic mitral (valve) insufficiency: Secondary | ICD-10-CM

## 2022-12-10 DIAGNOSIS — I2510 Atherosclerotic heart disease of native coronary artery without angina pectoris: Secondary | ICD-10-CM

## 2022-12-10 DIAGNOSIS — R579 Shock, unspecified: Secondary | ICD-10-CM | POA: Diagnosis not present

## 2022-12-10 DIAGNOSIS — J449 Chronic obstructive pulmonary disease, unspecified: Secondary | ICD-10-CM

## 2022-12-10 DIAGNOSIS — R578 Other shock: Secondary | ICD-10-CM

## 2022-12-10 DIAGNOSIS — J9601 Acute respiratory failure with hypoxia: Secondary | ICD-10-CM | POA: Diagnosis not present

## 2022-12-10 HISTORY — PX: IABP INSERTION: CATH118242

## 2022-12-10 HISTORY — PX: TRANSESOPHAGEAL ECHOCARDIOGRAM: EP1259

## 2022-12-10 HISTORY — PX: RIGHT/LEFT HEART CATH AND CORONARY ANGIOGRAPHY: CATH118266

## 2022-12-10 LAB — BLOOD GAS, VENOUS
Acid-base deficit: 10.4 mmol/L — ABNORMAL HIGH (ref 0.0–2.0)
Bicarbonate: 16.8 mmol/L — ABNORMAL LOW (ref 20.0–28.0)
O2 Saturation: 45.1 %
Patient temperature: 37
pCO2, Ven: 41 mm[Hg] — ABNORMAL LOW (ref 44–60)
pH, Ven: 7.22 — ABNORMAL LOW (ref 7.25–7.43)
pO2, Ven: 32 mm[Hg] (ref 32–45)

## 2022-12-10 LAB — MAGNESIUM: Magnesium: 2.8 mg/dL — ABNORMAL HIGH (ref 1.7–2.4)

## 2022-12-10 LAB — CBC
HCT: 40.3 % (ref 39.0–52.0)
Hemoglobin: 10.9 g/dL — ABNORMAL LOW (ref 13.0–17.0)
MCH: 20.1 pg — ABNORMAL LOW (ref 26.0–34.0)
MCHC: 27 g/dL — ABNORMAL LOW (ref 30.0–36.0)
MCV: 74.5 fL — ABNORMAL LOW (ref 80.0–100.0)
Platelets: 555 10*3/uL — ABNORMAL HIGH (ref 150–400)
RBC: 5.41 MIL/uL (ref 4.22–5.81)
RDW: 21 % — ABNORMAL HIGH (ref 11.5–15.5)
WBC: 42.5 10*3/uL — ABNORMAL HIGH (ref 4.0–10.5)
nRBC: 0.1 % (ref 0.0–0.2)

## 2022-12-10 LAB — GLUCOSE, CAPILLARY
Glucose-Capillary: 181 mg/dL — ABNORMAL HIGH (ref 70–99)
Glucose-Capillary: 164 mg/dL — ABNORMAL HIGH (ref 70–99)
Glucose-Capillary: 159 mg/dL — ABNORMAL HIGH (ref 70–99)

## 2022-12-10 LAB — BLOOD CULTURE ID PANEL (REFLEXED) - BCID2

## 2022-12-10 LAB — ECHOCARDIOGRAM COMPLETE
Area-P 1/2: 5.02 cm2
Calc EF: 57.9 %
Height: 69 in
MV M vel: 4.7 m/s
MV Peak grad: 88.4 mm[Hg]
MV VTI: 1.64 cm2
Radius: 0.85 cm
S' Lateral: 3.8 cm
Single Plane A2C EF: 54 %
Single Plane A4C EF: 62.1 %
Weight: 2419.77 [oz_av]

## 2022-12-10 LAB — POCT I-STAT 7, (LYTES, BLD GAS, ICA,H+H)
Acid-base deficit: 8 mmol/L — ABNORMAL HIGH (ref 0.0–2.0)
Bicarbonate: 19.8 mmol/L — ABNORMAL LOW (ref 20.0–28.0)
Calcium, Ion: 1.08 mmol/L — ABNORMAL LOW (ref 1.15–1.40)
HCT: 35 % — ABNORMAL LOW (ref 39.0–52.0)
Hemoglobin: 11.9 g/dL — ABNORMAL LOW (ref 13.0–17.0)
O2 Saturation: 97 %
Potassium: 4.6 mmol/L (ref 3.5–5.1)
Sodium: 140 mmol/L (ref 135–145)
TCO2: 21 mmol/L — ABNORMAL LOW (ref 22–32)
pCO2 arterial: 47.4 mm[Hg] (ref 32–48)
pH, Arterial: 7.229 — ABNORMAL LOW (ref 7.35–7.45)
pO2, Arterial: 112 mm[Hg] — ABNORMAL HIGH (ref 83–108)

## 2022-12-10 LAB — CG4 I-STAT (LACTIC ACID): Lactic Acid, Venous: 2.4 mmol/L (ref 0.5–1.9)

## 2022-12-10 LAB — MRSA NEXT GEN BY PCR, NASAL: MRSA by PCR Next Gen: DETECTED — AB

## 2022-12-10 LAB — RESP PANEL BY RT-PCR (RSV, FLU A&B, COVID)  RVPGX2
Influenza A by PCR: NEGATIVE
Influenza B by PCR: NEGATIVE
Resp Syncytial Virus by PCR: NEGATIVE
SARS Coronavirus 2 by RT PCR: NEGATIVE

## 2022-12-10 LAB — PHOSPHORUS: Phosphorus: 7.6 mg/dL — ABNORMAL HIGH (ref 2.5–4.6)

## 2022-12-10 LAB — POCT ACTIVATED CLOTTING TIME
Activated Clotting Time: 207 s
Activated Clotting Time: 140 s

## 2022-12-10 LAB — BASIC METABOLIC PANEL
Anion gap: 17 — ABNORMAL HIGH (ref 5–15)
BUN: 30 mg/dL — ABNORMAL HIGH (ref 8–23)
CO2: 17 mmol/L — ABNORMAL LOW (ref 22–32)
Calcium: 8.4 mg/dL — ABNORMAL LOW (ref 8.9–10.3)
Chloride: 103 mmol/L (ref 98–111)
Creatinine, Ser: 2.73 mg/dL — ABNORMAL HIGH (ref 0.61–1.24)
GFR, Estimated: 25 mL/min — ABNORMAL LOW (ref >60)
Glucose, Bld: 162 mg/dL — ABNORMAL HIGH (ref 70–99)
Potassium: 4.9 mmol/L (ref 3.5–5.1)
Sodium: 137 mmol/L (ref 135–145)

## 2022-12-10 LAB — ECHO TEE

## 2022-12-10 LAB — TROPONIN I (HIGH SENSITIVITY)
Troponin I (High Sensitivity): 1186 ng/L (ref <18)
Troponin I (High Sensitivity): 1283 ng/L (ref <18)
Troponin I (High Sensitivity): 1631 ng/L (ref <18)

## 2022-12-10 LAB — LACTIC ACID, PLASMA: Lactic Acid, Venous: 2.8 mmol/L (ref 0.5–1.9)

## 2022-12-10 LAB — HEPARIN LEVEL (UNFRACTIONATED): Heparin Unfractionated: 0.13 [IU]/mL — ABNORMAL LOW (ref 0.30–0.70)

## 2022-12-10 LAB — BRAIN NATRIURETIC PEPTIDE: B Natriuretic Peptide: 2009.1 pg/mL — ABNORMAL HIGH (ref 0.0–100.0)

## 2022-12-10 SURGERY — RIGHT/LEFT HEART CATH AND CORONARY ANGIOGRAPHY
Anesthesia: LOCAL

## 2022-12-10 SURGERY — LEFT HEART CATH AND CORONARY ANGIOGRAPHY
Anesthesia: LOCAL

## 2022-12-10 MED ORDER — FENTANYL BOLUS VIA INFUSION
25.0000 ug | INTRAVENOUS | Status: DC | PRN
  Administered 2022-12-11 (×2): 25 ug via INTRAVENOUS

## 2022-12-10 MED ORDER — FUROSEMIDE 10 MG/ML IJ SOLN
INTRAMUSCULAR | Status: AC
  Filled 2022-12-10: qty 4

## 2022-12-10 MED ORDER — HEPARIN SODIUM (PORCINE) 1000 UNIT/ML IJ SOLN
INTRAMUSCULAR | Status: DC | PRN
  Administered 2022-12-10: 4000 [IU] via INTRAVENOUS

## 2022-12-10 MED ORDER — SODIUM CHLORIDE 0.9 % IV SOLN: 250.0000 mL | INTRAVENOUS | Status: DC

## 2022-12-10 MED ORDER — FENTANYL 2500MCG IN NS 250ML (10MCG/ML) PREMIX INFUSION
25.0000 ug/h | INTRAVENOUS | Status: DC
  Administered 2022-12-10: 50 ug/h via INTRAVENOUS
  Administered 2022-12-11: 100 ug/h via INTRAVENOUS
  Filled 2022-12-10 (×3): qty 250

## 2022-12-10 MED ORDER — FENTANYL CITRATE PF 50 MCG/ML IJ SOSY: 50.0000 ug | PREFILLED_SYRINGE | Freq: Once | INTRAMUSCULAR | Status: AC

## 2022-12-10 MED ORDER — IOHEXOL 350 MG/ML SOLN
INTRAVENOUS | Status: DC | PRN
  Administered 2022-12-10: 45 mL via INTRA_ARTERIAL

## 2022-12-10 MED ORDER — FUROSEMIDE 10 MG/ML IJ SOLN
10.0000 mg/h | INTRAVENOUS | Status: DC
  Filled 2022-12-10: qty 20

## 2022-12-10 MED ORDER — EPINEPHRINE 1 MG/10ML IJ SOSY
PREFILLED_SYRINGE | INTRAMUSCULAR | Status: AC
  Filled 2022-12-10: qty 10

## 2022-12-10 MED ORDER — FUROSEMIDE 10 MG/ML IJ SOLN
40.0000 mg | Freq: Once | INTRAMUSCULAR | Status: AC
  Administered 2022-12-10: 40 mg via INTRAVENOUS

## 2022-12-10 MED ORDER — MIDAZOLAM HCL 2 MG/2ML IJ SOLN
2.0000 mg | Freq: Once | INTRAMUSCULAR | Status: AC
  Administered 2022-12-10: 1 mg via INTRAVENOUS

## 2022-12-10 MED ORDER — MIDAZOLAM-SODIUM CHLORIDE 100-0.9 MG/100ML-% IV SOLN
0.0000 mg/h | INTRAVENOUS | Status: DC
  Administered 2022-12-10: 2 mg/h via INTRAVENOUS
  Filled 2022-12-10 (×3): qty 100

## 2022-12-10 MED ORDER — VANCOMYCIN HCL IN DEXTROSE 1-5 GM/200ML-% IV SOLN: 1000.0000 mg | INTRAVENOUS | Status: DC

## 2022-12-10 MED ORDER — MIDAZOLAM HCL 2 MG/2ML IJ SOLN
INTRAMUSCULAR | Status: AC
  Administered 2022-12-10: 1 mg via INTRAVENOUS
  Filled 2022-12-10: qty 2

## 2022-12-10 MED ORDER — NOREPINEPHRINE 16 MG/250ML-% IV SOLN
0.0000 ug/min | INTRAVENOUS | Status: DC
  Administered 2022-12-10: 5 ug/min via INTRAVENOUS
  Administered 2022-12-11: 17 ug/min via INTRAVENOUS
  Filled 2022-12-10 (×2): qty 250

## 2022-12-10 MED ORDER — NOREPINEPHRINE BITARTRATE 1 MG/ML IV SOLN: 2.0000 ug/min | INTRAVENOUS | Status: DC

## 2022-12-10 MED ORDER — SODIUM CHLORIDE 0.9% FLUSH
10.0000 mL | Freq: Two times a day (BID) | INTRAVENOUS | Status: DC
  Administered 2022-12-10 (×2): 10 mL via INTRAVENOUS

## 2022-12-10 MED ORDER — HEPARIN (PORCINE) IN NACL 1000-0.9 UT/500ML-% IV SOLN
INTRAVENOUS | Status: DC | PRN
  Administered 2022-12-10 (×2): 500 mL

## 2022-12-10 MED ORDER — ROCURONIUM BROMIDE 50 MG/5ML IV SOLN: 100.0000 mg | Freq: Once | INTRAVENOUS | Status: AC

## 2022-12-10 MED ORDER — LIDOCAINE HCL (PF) 1 % IJ SOLN
INTRAMUSCULAR | Status: DC | PRN
  Administered 2022-12-10: 12 mL

## 2022-12-10 MED ORDER — PANTOPRAZOLE SODIUM 40 MG IV SOLR: 40.0000 mg | Freq: Every day | INTRAVENOUS | Status: DC

## 2022-12-10 MED ORDER — ALBUTEROL SULFATE (2.5 MG/3ML) 0.083% IN NEBU: 2.5000 mg | INHALATION_SOLUTION | RESPIRATORY_TRACT | Status: DC | PRN

## 2022-12-10 MED ORDER — LIDOCAINE HCL (PF) 1 % IJ SOLN
INTRAMUSCULAR | Status: AC
  Filled 2022-12-10: qty 30

## 2022-12-10 MED ORDER — SODIUM CHLORIDE 0.9 % IV SOLN
2.0000 g | INTRAVENOUS | Status: DC
  Administered 2022-12-11: 2 g via INTRAVENOUS
  Filled 2022-12-10: qty 12.5

## 2022-12-10 MED ORDER — CHLORHEXIDINE GLUCONATE CLOTH 2 % EX PADS
6.0000 | MEDICATED_PAD | Freq: Every day | CUTANEOUS | Status: DC
  Administered 2022-12-10: 6 via TOPICAL

## 2022-12-10 MED ORDER — SODIUM CHLORIDE 0.9 % IV SOLN: INTRAVENOUS | Status: DC | PRN

## 2022-12-10 MED ORDER — FENTANYL CITRATE PF 50 MCG/ML IJ SOSY
PREFILLED_SYRINGE | INTRAMUSCULAR | Status: AC
  Administered 2022-12-10: 50 ug via INTRAVENOUS
  Filled 2022-12-10: qty 2

## 2022-12-10 MED ORDER — POLYETHYLENE GLYCOL 3350 17 G PO PACK: 17.0000 g | PACK | Freq: Every day | ORAL | Status: DC

## 2022-12-10 MED ORDER — SODIUM BICARBONATE 8.4 % IV SOLN: 100.0000 meq | Freq: Once | INTRAVENOUS | Status: AC

## 2022-12-10 MED ORDER — FUROSEMIDE 10 MG/ML IJ SOLN: 40.0000 mg | Freq: Once | INTRAMUSCULAR | Status: DC

## 2022-12-10 MED ORDER — NITROGLYCERIN 1 MG/10 ML FOR IR/CATH LAB
INTRA_ARTERIAL | Status: AC
  Filled 2022-12-10: qty 10

## 2022-12-10 MED ORDER — VERAPAMIL HCL 2.5 MG/ML IV SOLN
INTRAVENOUS | Status: DC | PRN
  Administered 2022-12-10: 10 mL via INTRA_ARTERIAL

## 2022-12-10 MED ORDER — SODIUM BICARBONATE 8.4 % IV SOLN
INTRAVENOUS | Status: AC
  Administered 2022-12-10: 100 meq via INTRAVENOUS
  Filled 2022-12-10: qty 100

## 2022-12-10 MED ORDER — MIDAZOLAM HCL 2 MG/2ML IJ SOLN: 1.0000 mg | INTRAMUSCULAR | Status: DC | PRN

## 2022-12-10 MED ORDER — VERAPAMIL HCL 2.5 MG/ML IV SOLN
INTRAVENOUS | Status: AC
  Filled 2022-12-10: qty 2

## 2022-12-10 MED ORDER — INSULIN ASPART 100 UNIT/ML IJ SOLN
0.0000 [IU] | INTRAMUSCULAR | Status: DC
  Administered 2022-12-10 – 2022-12-11 (×3): 2 [IU] via SUBCUTANEOUS

## 2022-12-10 MED ORDER — DOCUSATE SODIUM 50 MG/5ML PO LIQD
100.0000 mg | Freq: Two times a day (BID) | ORAL | Status: DC
  Administered 2022-12-10: 100 mg
  Filled 2022-12-10: qty 10

## 2022-12-10 MED ORDER — NOREPINEPHRINE 4 MG/250ML-% IV SOLN
2.0000 ug/min | INTRAVENOUS | Status: DC
  Administered 2022-12-10: 2 ug/min via INTRAVENOUS
  Administered 2022-12-10: 10 ug/min via INTRAVENOUS
  Filled 2022-12-10 (×3): qty 250

## 2022-12-10 MED ORDER — METRONIDAZOLE 500 MG/100ML IV SOLN
500.0000 mg | Freq: Two times a day (BID) | INTRAVENOUS | Status: DC
  Administered 2022-12-10 – 2022-12-11 (×2): 500 mg via INTRAVENOUS
  Filled 2022-12-10 (×2): qty 100

## 2022-12-10 MED ORDER — NOREPINEPHRINE 4 MG/250ML-% IV SOLN
INTRAVENOUS | Status: AC
  Filled 2022-12-10: qty 250

## 2022-12-10 MED ORDER — ROCURONIUM BROMIDE 10 MG/ML (PF) SYRINGE
PREFILLED_SYRINGE | INTRAVENOUS | Status: AC
  Administered 2022-12-10: 100 mg via INTRAVENOUS
  Filled 2022-12-10: qty 10

## 2022-12-10 MED ORDER — ORAL CARE MOUTH RINSE: 15.0000 mL | OROMUCOSAL | Status: DC | PRN

## 2022-12-10 MED ORDER — SUCCINYLCHOLINE CHLORIDE 200 MG/10ML IV SOSY
PREFILLED_SYRINGE | INTRAVENOUS | Status: AC
  Filled 2022-12-10: qty 10

## 2022-12-10 MED ORDER — ETOMIDATE 2 MG/ML IV SOLN
INTRAVENOUS | Status: AC
  Administered 2022-12-10: 15 mg via INTRAVENOUS
  Filled 2022-12-10: qty 20

## 2022-12-10 MED ORDER — MIDAZOLAM BOLUS VIA INFUSION
0.0000 mg | INTRAVENOUS | Status: DC | PRN
  Administered 2022-12-11: 1 mg via INTRAVENOUS

## 2022-12-10 MED ORDER — HEPARIN BOLUS VIA INFUSION
2000.0000 [IU] | Freq: Once | INTRAVENOUS | Status: AC
  Administered 2022-12-10: 2000 [IU] via INTRAVENOUS
  Filled 2022-12-10: qty 2000

## 2022-12-10 MED ORDER — ETOMIDATE 2 MG/ML IV SOLN: 20.0000 mg | Freq: Once | INTRAVENOUS | Status: AC

## 2022-12-10 MED ORDER — ORAL CARE MOUTH RINSE
15.0000 mL | OROMUCOSAL | Status: DC
  Administered 2022-12-10 – 2022-12-11 (×9): 15 mL via OROMUCOSAL

## 2022-12-10 MED ORDER — HEPARIN SODIUM (PORCINE) 1000 UNIT/ML IJ SOLN
INTRAMUSCULAR | Status: AC
  Filled 2022-12-10: qty 10

## 2022-12-10 SURGICAL SUPPLY — 17 items
BALLN IABP SENSA PLUS 8F 50CC (BALLOONS) ×2
BALLOON IABP SENS PLUS 8F 50CC (BALLOONS) IMPLANT
CATH INFINITI 5 FR JL3.5 (CATHETERS) IMPLANT
CATH INFINITI AMBI 5FR TG (CATHETERS) IMPLANT
CATH SWAN GANZ 7F STRAIGHT (CATHETERS) IMPLANT
CATH-GARD ARROW CATH SHIELD (MISCELLANEOUS) ×2
DEVICE RAD COMP TR BAND LRG (VASCULAR PRODUCTS) IMPLANT
ELECT DEFIB PAD ADLT CADENCE (PAD) IMPLANT
GLIDESHEATH SLEND SS 6F .021 (SHEATH) IMPLANT
GUIDEWIRE INQWIRE 1.5J.035X260 (WIRE) IMPLANT
INQWIRE 1.5J .035X260CM (WIRE) ×2
KIT ENCORE 26 ADVANTAGE (KITS) IMPLANT
KIT MICROPUNCTURE NIT STIFF (SHEATH) IMPLANT
PACK CARDIAC CATHETERIZATION (CUSTOM PROCEDURE TRAY) ×3 IMPLANT
SHEATH PINNACLE 7F 10CM (SHEATH) IMPLANT
SHEATH PROBE COVER 6X72 (BAG) IMPLANT
SHIELD CATHGARD ARROW (MISCELLANEOUS) IMPLANT

## 2022-12-10 NOTE — Consult Note (Addendum)
Cardiology Consultation   Patient ID: AQUILA MAHI MRN: [id number]; DOB: February 01, 1956  Admit date: Dec 11, 2022 Date of Consult: 12/29/2022  PCP:  Ivonne Andrew, NP   [facility] Providers Cardiologist:  Nicki Guadalajara, MD        Patient Profile:   DONLD LUEBBE is a 67 y.o. 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 Vassie Loll.  History of Present Illness:   Mr. Fechner has complex history as above. In 2018 he had an abnormal stress test before urologic surgery prompting cath showing multivessel disease, full report outlined below, managed medically. Given lack of chest pain and ECG felt consistent with collateralized subtotal LAD stenosis, he was granted clearance for surgery and intervention deferred so that he would not be committed to pre-operative DAPT. There was mention of possibly pursuing delayed PCI to RCA but over the years has done okay so was continued on medical management. He also has significant oncologic history. Pre-VATS/lobectomy nuc in 06/2021 was low risk with thinning and decreased tracer activity in the inferoseptal wall (base, mid) inferior wall (mid) consistent with probable soft tissue attenuation (diaphragm), no significant ischemia or scar, normal EF. Last echo 03/2021 showed EF 60-65%, mild LVH, mild LAE, trivial MR. There are multiple phone notes in 2023 where he did not attend oncology f/u with Dr. Arbutus Ped.  He presented to [facility] with 1 day history of worsening SOB with diaphoresis, wheezing, cough, and congestion. He had  not had any chest pain with this presentation or recently. He otherwise has been feeling at baseline except had a mechanical fall about a week ago, injured his knee and hit his head, was seen by primary care and started on depression medicine, no neurologic complaints otherwise. Given respiratory distress upon presentation here, required BiPAP and bronchodilators, steroids, magnesium, and IV Lasix. He was significantly tachycardic and hypertensive originally planned to start IV NTG but this subsequently improved with measures above, then developed hypotension overnight with pressure down to the 60s requiring Levophed. His admitting diagnosis was felt to be mixed picture with COPD exacerbation, PNA, and pulmonary edema. His WBC is markedly elevated to 42k and pro-cal high at 1.6.Marland KitchenHe was placed on broad spectrum antibiotics. Initial CXR showed moderate-large L pleural effusion subsequently decreasing on f/u CXR this AM. Labs otherwise notable for BNP 1307, hsTroponin 518->636->1186, lactate >6, admitting Cr 1.7 -> increasing to 2.73 today. Covid/flu/RSV neg. He also received 325mg  ASA and started on heparin drip. He was written to continue PTA ASA + Plavix. EKGs are concerning for coronary ischemia with inferior ST elevation and ST depression I, avL, V4-V6. QT is also beginning to prolong this morning. See below for further interpretation. He is seen this morning on BiPAP reporting he is feeling a little better and continues to deny chest pain.   Past Medical History:  Diagnosis Date   Arthritis    Chronic right ear pain 07/2019   Coronary artery disease    Depression 06/12/11   Buproprion per Dr. [name]   Drainage from ear, right 07/2019   Dyspnea    Full dentures  Fitting Per Dr. 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  of Cisplatin and Dhydrtion   Skin rash 01/2019   Smoking 06/12/11   1/2 pack/day   Status post chemotherapy 10/30/10 - 11/09/10    2 doses of Q 3 week Cisplatin   Status post radiation therapy 10/11/10 - 12/24/10   Bilateral Neck and Mucosa axis /  70 gray in 35 fractions   Xerostomia     Past Surgical History:  Procedure Laterality Date   APPENDECTOMY     BIOPSY TONGUE     TonsilandBaseoftongue   BRONCHIAL BRUSHINGS  07/20/2021   Procedure: BRONCHIAL BRUSHINGS;  Surgeon: Leslye Peer, MD;  Location: [facility] ENDOSCOPY;  Service: Pulmonary;;   BRONCHIAL NEEDLE ASPIRATION BIOPSY  07/20/2021   Procedure: BRONCHIAL NEEDLE ASPIRATION BIOPSIES;  Surgeon: Leslye Peer, MD;  Location: [facility] ENDOSCOPY;  Service: Pulmonary;;   FIDUCIAL MARKER PLACEMENT  07/20/2021   Procedure: FIDUCIAL DYE MARKING;  Surgeon: Leslye Peer, MD;  Location: [facility] ENDOSCOPY;  Service: Pulmonary;;   HERNIA REPAIR     umbilical with mesh   INTERCOSTAL NERVE BLOCK Left 07/20/2021   Procedure: INTERCOSTAL NERVE BLOCK;  Surgeon: Corliss Skains, MD;  Location: [facility] OR;  Service: Thoracic;  Laterality: Left;   IR NEPHROSTOMY EXCHANGE LEFT  01/10/2017   IR NEPHROSTOMY PLACEMENT LEFT  11/15/2016   LEFT HEART CATH AND CORONARY ANGIOGRAPHY N/A 01/18/2017   Procedure: LEFT HEART CATH AND CORONARY ANGIOGRAPHY;  Surgeon: Lennette Bihari, MD;  Location: [facility] INVASIVE CV LAB;  Service: Cardiovascular;  Laterality: N/A;   LYMPH NODE BIOPSY Left 07/20/2021   Procedure: LYMPH NODE BIOPSY;  Surgeon: Corliss Skains, MD;  Location: [facility] OR;  Service: Thoracic;  Laterality: Left;   NEPHROLITHOTOMY Left 02/11/2017   Procedure: NEPHROLITHOTOMY PERCUTANEOUS;  Surgeon: Ihor Gully, MD;  Location: [facility] ORS;  Service: Urology;  Laterality: Left;   PEG TUBE REMOVAL  04/13/11   PERIPHERAL VASCULAR INTERVENTION Right 03/05/2021   Procedure: PERIPHERAL VASCULAR INTERVENTION;  Surgeon: Nada Libman, MD;  Location: [facility] INVASIVE CV LAB;  Service:  Cardiovascular;  Laterality: Right;  right subclavian   THROMBECTOMY BRACHIAL ARTERY Right 03/02/2021   Procedure: THROMBECTOMY RIGHT SUBCLAVIAN, BRACHIAL, AXILLA, AND RADIAL ARTERY;  Surgeon: Cephus Shelling, MD;  Location: [facility] OR;  Service: Vascular;  Laterality: Right;   TONSILLECTOMY     right side   UPPER EXTREMITY ANGIOGRAPHY N/A 03/05/2021   Procedure: UPPER EXTREMITY ANGIOGRAPHY;  Surgeon: Nada Libman, MD;  Location: [facility] INVASIVE CV LAB;  Service: Cardiovascular;  Laterality: N/A;   VIDEO BRONCHOSCOPY WITH RADIAL ENDOBRONCHIAL ULTRASOUND  07/20/2021   Procedure: VIDEO BRONCHOSCOPY WITH RADIAL ENDOBRONCHIAL ULTRASOUND;  Surgeon: Leslye Peer, MD;  Location: [facility] ENDOSCOPY;  Service: Pulmonary;;     Home Medications:  Prior to Admission medications   Medication Sig Start Date End Date Taking? Authorizing Provider  albuterol (VENTOLIN HFA) 108 (90 Base) MCG/ACT inhaler INHALE 2 PUFFS INTO THE LUNGS EVERY 4 (FOUR) HOURS AS NEEDED FOR WHEEZING OR SHORTNESS OF BREATH (COUGH, SHORTNESS OF BREATH OR WHEEZING.). 08/18/22 08/18/23  Ivonne Andrew, NP  ASPIRIN LOW DOSE 81 MG tablet TAKE 1 TABLET(81 MG) BY MOUTH DAILY. SWALLOW WHOLE 03/16/22   Chuck Hint, MD  atorvastatin (LIPITOR) 40 MG  tablet Take 1 tablet (40 mg total) by mouth daily. 11/24/22   Ivonne Andrew, NP  Budeson-Glycopyrrol-Formoterol (BREZTRI AEROSPHERE) 160-9-4.8 MCG/ACT AERO Inhale 2 puffs into the lungs in the morning and at bedtime. Patient taking differently: Inhale 2 puffs into the lungs every morning. 08/04/21   Leslye Peer, MD  budesonide-formoterol (SYMBICORT) 80-4.5 MCG/ACT inhaler INHALE 2 PUFFS INTO THE LUNGS 2 (TWO) TIMES DAILY. 08/18/22 08/18/23  Ivonne Andrew, NP  clopidogrel (PLAVIX) 75 MG tablet TAKE 1 TABLET(75 MG) BY MOUTH DAILY 11/22/22   Cephus Shelling, MD  docusate sodium (COLACE) 100 MG capsule Take 100 mg by mouth at bedtime.    [provider]  escitalopram (LEXAPRO) 10 MG  tablet Take 1 tablet (10 mg total) by mouth daily. 11/30/22   Donell Beers, FNP  hydrALAZINE (APRESOLINE) 25 MG tablet Take 1 tablet (25 mg total) by mouth 3 (three) times daily. 11/30/22   Donell Beers, FNP  levothyroxine (SYNTHROID) 112 MCG tablet Take 1 tablet (112 mcg total) by mouth daily. 11/24/22 11/24/23  Ivonne Andrew, NP  metoprolol tartrate (LOPRESSOR) 25 MG tablet Take 1 tablet (25 mg total) by mouth 2 (two) times daily. 11/24/22   Ivonne Andrew, NP  oxybutynin (DITROPAN) 5 MG tablet Take 1 tablet (5 mg total) by mouth 3 (three)  times daily as needed (bladder spasm/leakage around catheter). 09/16/21     oxyCODONE (OXY IR/ROXICODONE) 5 MG immediate release tablet Take 1 tablet (5 mg total) by mouth every 6 (six) hours as needed for moderate pain or severe pain. 11/30/22   Paseda, Baird Kay, FNP  Vitamin D, Ergocalciferol, (DRISDOL) 1.25 MG (50000 UNIT) CAPS capsule TAKE 1 CAPSULE BY MOUTH EVERY 7 DAYS. 11/24/22 11/24/23  Ivonne Andrew, NP    Inpatient Medications: Scheduled Meds:  arformoterol  15 mcg Nebulization BID   aspirin  324 mg Oral NOW   Or   aspirin  300 mg Rectal NOW   aspirin EC  81 mg Oral Daily   budesonide (PULMICORT) nebulizer solution  0.5 mg Nebulization BID   Chlorhexidine Gluconate Cloth  6 each Topical Daily   clopidogrel  75 mg Oral Daily   levothyroxine  112 mcg Oral Q0600   methylPREDNISolone (SOLU-MEDROL) injection  80 mg Intravenous Q12H   revefenacin  175 mcg Nebulization Daily   sodium chloride flush  10 mL Intravenous Q12H   Continuous Infusions:  [START ON 12/18/2022] ceFEPime (MAXIPIME) IV     heparin 1,050 Units/hr (January 05, 2023 0800)   norepinephrine (LEVOPHED) Adult infusion 10 mcg/min (01/05/23 0800)   [START ON 12/03/2022] vancomycin     PRN Meds: acetaminophen, albuterol, docusate sodium, hydrALAZINE, oxyCODONE, polyethylene glycol  Allergies:    Allergies  Allergen Reactions   Codeine Nausea Only    Social History:    Social History   Socioeconomic History   Marital status: Single    Spouse name: Not on file   Number of children: 3   Years of education: Not on file   Highest education level: Not on file  Occupational History   Not on file  Tobacco Use   Smoking status: Every Day    Current packs/day: 0.50    Average packs/day: 0.5 packs/day for 55.0 years (27.5 ttl pk-yrs)    Types: Cigarettes   Smokeless tobacco: Never   Tobacco comments:    Has not smoked since  07/14/21 [facility] 08/04/21  Vaping Use   Vaping status: Never Used  Substance and Sexual Activity  Alcohol use: No    Alcohol/week: 0.0 standard drinks of alcohol   Drug use: No   Sexual activity: Not Currently  Other Topics Concern   Not on file  Social History Narrative   Not on file   Social Determinants of Health   Financial Resource Strain: Low Risk  (04/29/2022)   Overall Financial Resource Strain (CARDIA)    Difficulty of Paying Living Expenses: Not hard at all  Food Insecurity: No Food Insecurity (04/29/2022)   Hunger Vital Sign    Worried About Running Out of Food in the Last Year: Never true    Ran Out of Food in the Last Year: Never true  Transportation Needs: No Transportation Needs (04/29/2022)   PRAPARE - Administrator, Civil Service (Medical): No    Lack of Transportation (Non-Medical): No  Physical Activity: Inactive (04/29/2022)   Exercise Vital Sign    Days of Exercise per Week: 0 days    Minutes of Exercise per Session: 0 min  Stress: No Stress Concern Present (04/29/2022)   [employer] of Occupational Health - Occupational Stress Questionnaire    Feeling of Stress : Not at all  Social Connections: Socially Isolated (04/29/2022)   Social Connection and Isolation Panel [NHANES]    Frequency of Communication with Friends and Family: More than three times a week    Frequency of Social Gatherings with Friends and Family: More than three times a week    Attends Religious Services: Never     Database administrator or Organizations: No    Attends Banker Meetings: Never    Marital Status: Widowed  Intimate Partner Violence: Not At Risk (04/29/2022)   Humiliation, Afraid, Rape, and Kick questionnaire    Fear of Current or Ex-Partner: No    Emotionally Abused: No    Physically Abused: No    Sexually Abused: No    Family History:   Family History  Problem Relation Age of Onset   Cancer Father 59       colon   Cancer Sister 17       leukemia     ROS:  Please see the history of present illness.  All other ROS reviewed and negative.     Physical Exam/Data:   Vitals:   12/07/2022 0815 12/23/2022 0830 12/07/2022 0845 12/03/2022 0907  BP: 106/67 96/62 101/68   Pulse: 80 77 78   Resp: (!) 27 (!) 22 (!) 26   Temp:    97.7 F (36.5 C)  TempSrc:    Axillary  SpO2: 96% 93% 94%   Weight:      Height:        Intake/Output Summary (Last 24 hours) at 12/27/2022 0912 Last data filed at 12/01/2022 0800 Gross per 24 hour  Intake 480.07 ml  Output --  Net 480.07 ml      12/19/2022    5:00 AM 12/01/2022   12:46 AM 12/23/2022    6:21 PM  Last 3 Weights  Weight (lbs) 151 lb 3.8 oz 151 lb 3.8 oz 156 lb 15.5 oz  Weight (kg) 68.6 kg 68.6 kg 71.2 kg     Body mass index is 22.33 kg/m.  General: Well developed, well nourished, in no acute distress. Head: Normocephalic, atraumatic, sclera non-icteric, no xanthomas, nares are without discharge. Neck: Negative for carotid bruits. JVP not elevated. Lungs: Diffuse rhonchi on BiPAP. Breathing is unlabored. Heart: RRR S1 S2 without murmurs, rubs, or gallops.  Abdomen: Soft, non-tender, non-distended with  normoactive bowel sounds. No rebound/guarding. Extremities: No clubbing or cyanosis. No edema. Distal pedal pulses are 2+ and equal bilaterally. Mild scrapes on kneecaps Neuro: Alert and oriented X 3. Moves all extremities spontaneously. Psych:  Responds to questions appropriately with a normal affect.   EKG:  The EKG was  personally reviewed and demonstrates:   18:40: sinus tach 108bpm with marked ST depression I, avL, V4-V6 otherwise nonspecific STTW changes 19:27: sinus tach 105bpm with continued ST depression I, avL, V4-V6, questionable inferior ST elevation confounded by baseline artifact 21:30: NSR 76bpm, continued ST depression I, avL, V4-V6, with subtle ST elevation III, avF with TWI in these inferior leads This morning: NSR 78bpm LAE,ST depression now encroaching V3 and continued in  V4-V6, continued subtle ST elevation III, avF with TWI inferiorly, QT now prolonging to Posterior lead EKG was also performed and appears similar to the above  Telemetry:  Telemetry was personally reviewed and demonstrates:  NSR  Relevant CV Studies: 2D Echo 03/2021   1. Left ventricular ejection fraction, by estimation, is 60 to 65%. The  left ventricle has normal function. The left ventricle has no regional  wall motion abnormalities. There is mild concentric left ventricular  hypertrophy. Left ventricular diastolic  parameters were normal.   2. Right ventricular systolic function is normal. The right ventricular  size is normal.   3. Left atrial size was mildly dilated.   4. The mitral valve is normal in structure. Trivial mitral valve  regurgitation.   5. The aortic valve is tricuspid. There is moderate calcification of the  aortic valve. There is moderate thickening of the aortic valve. Aortic  valve regurgitation is not visualized. Aortic valve  sclerosis/calcification is present, without any evidence  of aortic stenosis.   6. The inferior vena cava is normal in size with greater than 50%  respiratory variability, suggesting right atrial pressure of 3 mmHg.   Comparison(s): Compared to prior echo in 05/2020, there is no significant  change.   Laboratory Data:  High Sensitivity Troponin:   Recent Labs  Lab 07-Jan-2023 1829 January 07, 2023 2030 11/30/2022 0520  TROPONINIHS 518* 636* 1,186*      Chemistry Recent Labs  Lab 2023/01/07 1829 12/24/2022 0520  NA 138 137  K 4.6 4.9  CL 107 103  CO2 19* 17*  GLUCOSE 178* 162*  BUN 16 30*  CREATININE 1.70* 2.73*  CALCIUM 8.8* 8.4*  MG  --  2.8*  GFRNONAA 44* 25*  ANIONGAP 12 17*    Recent Labs  Lab Jan 07, 2023 1829  PROT 7.2  ALBUMIN 3.3*  AST 14*  ALT 13  ALKPHOS 99  BILITOT 0.6   Lipids No results for input(s): "CHOL", "TRIG", "HDL", "LABVLDL", "LDLCALC", "CHOLHDL" in the last 168 hours.  Hematology Recent Labs  Lab 01/07/23 1829 11/30/2022 0330  WBC 25.3* 42.5*  RBC 5.76 5.41  HGB 11.5* 10.9*  HCT 42.0 40.3  MCV 72.9* 74.5*  MCH 20.0* 20.1*  MCHC 27.4* 27.0*  RDW 21.0* 21.0*  PLT 536* 555*   Thyroid No results for input(s): "TSH", "FREET4" in the last 168 hours.  BNP Recent Labs  Lab Jan 07, 2023 1829  BNP 1,307.3*    DDimer No results for input(s): "DDIMER" in the last 168 hours.   Radiology/Studies:  DG Chest Port 1 View  Result Date: 12/01/2022 CLINICAL DATA:  67 year old male history of acute respiratory failure. Shortness of breath. EXAM: PORTABLE CHEST 1 VIEW COMPARISON:  Chest x-ray 07-Jan-2023. FINDINGS: Confluent opacity at the left base which  may reflect atelectasis and/or consolidation, with superimposed small left pleural effusion, which is decreased compared to the prior study. Widespread interstitial prominence, diffuse peribronchial cuffing and ill-defined airspace disease noted throughout the right lung, most confluent throughout the right mid to lower lung. No pneumothorax. Heart size is normal. The patient is rotated to the left on today's exam, resulting in distortion of the mediastinal contours and reduced diagnostic sensitivity and specificity for mediastinal pathology. Atherosclerotic calcifications in the thoracic aorta. IMPRESSION: 1. Decreasing left-sided pleural effusion. Radiographic appearance the chest is otherwise unchanged, as above. Electronically Signed   By: Trudie Reed M.D.    On: 11/30/2022 06:11   DG Chest Port 1 View  Result Date: 2023/01/03 CLINICAL DATA:  Shortness of breath.  Wheezing. EXAM: PORTABLE CHEST 1 VIEW COMPARISON:  08/04/2021 FINDINGS: Moderate to large left pleural effusion with obscuration of the left hemidiaphragm. Asymmetric interstitial and airspace opacity in the right lung with indistinctness of pulmonary vasculature especially on the right. A right subclavian stent is noted. Atherosclerotic calcification of the aortic arch. Heart size within normal limits. IMPRESSION: 1. Moderate to large left pleural effusion. 2. Asymmetric interstitial and airspace opacity in the right lung with indistinctness of pulmonary vasculature especially on the right. Possibilities include asymmetric edema, asymmetric atypical pneumonia, or pulmonary embolus. Electronically Signed   By: [name] M.D.   On: 01-03-2023 20:20     Assessment and Plan:   1. Acute hypercarbic respiratory failure felt to be mixed picture with PNA (high pro-cal, leukocytosis), COPD exacerbation, acute heart failure (unknown if systolic vs diastolic but concerning for the former) 2. Shock requiring pressors, suspect septic but cannot exclude cardiogenic, BP 90s-low 100s on Levophed 3. Suspected acute MI with known severe CAD 2018 4. QT prolongation, suspect ischemic in etiology 5. AKI on CKD 3b with worsening renal failure 6. H/o oropharyngeal cancer s/p chemoradiation 2012, neuroendocrine lung CA S/p VATS/LLLectomy 2023 7. H/o PAD with right arm ischemia 03/2021 s/p RUE thrombectomy and right subclavian stent by VVS 03/2021 (mention of possible etiology due to postradiation stenosis), AAA 3.4cm by PET 04/2021 8. Hypothyroidism 9. Mild microcytic anemia/ thrombocytosis  Patient seen/examined urgently with Dr. Izora Ribas given highly abnormal EKG. Very challenging situation - medical picture concerning for acute infection, cardiac ischemia, complicated by worsening renal  insufficiency. Have ordered a f/u troponin to trend. Called echo team for stat echo. Avoid additional QT prolonging agents. May need transfer to [facility]. Further recs to follow.  Risk Assessment/Risk Scores:     TIMI Risk Score if we are putting this into STEMI bucket:   The patient's TIMI risk score is 10, which indicates a 35.9% risk of all cause mortality at 30 days.   New York Heart Association (NYHA) Functional Class NYHA Class IV on arrival        For questions or updates, please contact [facility] Please consult [email or web address] for contact info under    Signed, Laurann Montana, PA-C  12/15/2022 8:38 AM     Personally seen and examined. Agree with APP above with the following comments:  Mr. Rayas, a 67 year old with a history of multiple malignancies including throat and lung cancer, both previously treated, presents with NSTEMI, shock and COPD. He experienced an overnight increase in cardiac enzymes from 518 to 1086, coupled with an increase in creatinine from a baseline of 1.5 to 2.73. He initially presented with respiratory distress and acute onset shortness of breath, with a white count of 25,000 and a lactate of  6.2, later proven to be 3.6.  His hypotension worsened, and there were concerns about inferior ST elevations. His last chest imaging showed enlargement of pulmonary nodules with new nodules, emphysema, and diffuse chronic calcifications.  Despite these findings, he reported feeling fine, with shortness of breath rapidly improving on BiPAP and medical therapy. He denied any chest pain, pressure, or tightness. He has a history of multi-vessel disease with collaterals from a heart catheterization in 2018 and was managed medically. He was asymptomatic when last seen by his cardiologist, Dr. Tresa Endo.  Exam notable for  Ill male, tolerateing BIPAP 12/6 and able to lie supine.  Coarse breath sounds bilaterally.  RRR wth murmur. +2 femoral pulses No tachypnea. Rest  as above  Personally reviewed relevant tests;  LABS Cardiac enzymes: 518 to 636 to 1086 (12/08/2022) Creatinine: 2.73 (12/21/2022) WBC: 25000 (12/08/2022) Lactate: 6.2 to 3.6 (12/28/2022)  RADIOLOGY Chest imaging from 2023: Enlargement of pulmonary nodules, new pulmonary nodules, emphysema, diffuse chronic calcifications  DIAGNOSTIC REPORTS Heart catheterization: Multivessel disease with collaterals (2018- report only)  A bedside echo was performed: Abnormal echogenicity on mitral valve, significant mitral regurgitation with splay artifact, grossly  LV function with basal inferior and inferolateral wall motion abnormlities, Mitral valve echodensity not present on prior imaging. WMAs are new  Would recommend  - Transfer to [facility] for eval -High risk for cath; Inferior STEMI on EKG,  will defer LHC for now; he is consented for this and aware of his kidney risk, as well as general risks, if he decompensates we have reviewed his risks and benefits at length - DAPT and heparin - Repeat IV lasix dose; his creatinine may be related to unmasking of CKD - when improved needs TEE; follow blood cultures - wean pressors as tolerate, I do not suspect he will need inotropes - formal TTE -Continue monitoring cardiac enzymes and EKG changes. -Monitor renal function. -Continue BiPAP and monitor respiratory status. With TRH - Evidence of infective endocarditis with abnormal echo density on mitral valve and significant mitral regurgitation; continue anitbiotics and  f/u blood cultures  Discussed case with PCCM, nursing team, Intervention on call, and his daughter Zella Ball  CRITICAL CARE Performed by: Brailyn Delman A Machai Desmith  Total critical care time: 71 minutes. Critical care time was exclusive of separately billable procedures and treating other patients. Critical care was necessary to treat or prevent imminent or life-threatening deterioration. Critical care was time spent personally by me on the  following activities: development of treatment plan with patient and/or surrogate as well as nursing, discussions with PCCM, evaluation of patient's response to treatment, examination of patient, obtaining history from patient or surrogate (daughter Zella Ball), ordering and performing treatments and interventions, ordering and review of laboratory studies, ordering and review of radiographic studies, pulse oximetry and re-evaluation of patient's condition' including.    Signed, Riley Lam, MD FASE [facility] [place]  [facility]  2022/12/30 9:26 AM    Patient doing worse through transport.  Troponin is markedly elevated. Pressor requirement increased. He gives his consent for LHC/RHC, Intubation, and central line. Updating Daughter Lavella Lemons for [facility] Room 9 and access.  Has not had CXR and needs fluoro. Shock team notification. Increase to 12 of levophed  CRITICAL CARE Performed by: Landen Knoedler A Veona Bittman  Total critical care time: 30 minutes. Critical care time was exclusive of separately billable procedures and treating other patients. Critical care was necessary to treat or prevent imminent or life-threatening deterioration. Critical care was time spent personally by me on the  following activities: Cath and shock team activation.  Updating daughter.  Medical management    Signed, Riley Lam, MD FASE [facility] [place]  [facility]  12/01/2022 1:14 PM

## 2022-12-10 NOTE — Transportation (Signed)
[facility] arrived for transportation to [facility]. Patient remains on Bipap @50 %. 3 intact IVs in place. Levophed infusion at 27mcg/min and Heparin infusion at 1050 units/hr.   Transported via stretcher.  Contacted receiving unit/[facility] RN Rocky Link to notify of departure.

## 2022-12-10 NOTE — Progress Notes (Signed)
Pt transported from cath lab 9 to 2H04 on ventilator. Pt tolerated well and remains on documented settings.

## 2022-12-10 NOTE — Consult Note (Signed)
[address]       [name] [address]             [phone]           ALGERT RYLAND [facility] Medical Record #[id number] Date of Birth: 08-01-1955  No ref. provider found Ivonne Andrew, NP  Chief Complaint:    Chief Complaint  Patient presents with   Shortness of Breath    History of Present Illness:     Very unfortunate 67 yo with COPD on night O2, known CAD with occluded LAD, sp lobectomy for tumor and chemo radiation for laryngeal cancer who continues to smoke who was admitted with evidence of shock and difficulty breathing. Eventually was taken to cath lab emergently after needing to be intubated and found to have baseline CAD but on TEE with ruptured papillary muscle and torential MR. Pt with what appears to have normal LV function in light of very severe MR. Not a Mteer candidate due to anatomy. Currently with Cr 2.7      Past Medical History:  Diagnosis Date   Arthritis    Chronic right ear pain 07/2019   Coronary artery disease    Depression 06/12/11   Buproprion per Dr. [name]   Drainage from ear, right 07/2019   Dyspnea    Full dentures    Fitting Per Dr. 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 of Cisplatin and Dhydrtion   Skin rash 01/2019   Smoking 06/12/11   1/2 pack/day   Status post chemotherapy 10/30/10 - 11/09/10    2 doses of Q 3 week Cisplatin   Status post radiation therapy 10/11/10 - 12/24/10   Bilateral Neck and Mucosa axis /  70 gray in 35 fractions   Xerostomia     Past Surgical History:  Procedure Laterality Date   APPENDECTOMY     BIOPSY TONGUE     TonsilandBaseoftongue   BRONCHIAL BRUSHINGS  07/20/2021   Procedure: BRONCHIAL BRUSHINGS;  Surgeon: Leslye Peer, MD;  Location: [facility] ENDOSCOPY;  Service: Pulmonary;;   BRONCHIAL NEEDLE  ASPIRATION BIOPSY  07/20/2021   Procedure: BRONCHIAL NEEDLE ASPIRATION BIOPSIES;  Surgeon: Leslye Peer, MD;  Location: [facility] ENDOSCOPY;  Service: Pulmonary;;   FIDUCIAL MARKER PLACEMENT  07/20/2021   Procedure: FIDUCIAL DYE MARKING;  Surgeon: Leslye Peer, MD;  Location: [facility] ENDOSCOPY;  Service: Pulmonary;;   HERNIA REPAIR     umbilical with mesh   INTERCOSTAL NERVE BLOCK Left 07/20/2021   Procedure: INTERCOSTAL NERVE BLOCK;  Surgeon: Corliss Skains, MD;  Location: [facility] OR;  Service: Thoracic;  Laterality: Left;   IR NEPHROSTOMY EXCHANGE LEFT  01/10/2017   IR NEPHROSTOMY PLACEMENT LEFT  11/15/2016   LEFT HEART CATH AND CORONARY ANGIOGRAPHY N/A 01/18/2017   Procedure: LEFT HEART CATH AND CORONARY ANGIOGRAPHY;  Surgeon: Lennette Bihari, MD;  Location: [facility] INVASIVE CV LAB;  Service: Cardiovascular;  Laterality: N/A;   LYMPH NODE BIOPSY Left 07/20/2021   Procedure: LYMPH NODE BIOPSY;  Surgeon: Corliss Skains, MD;  Location: [facility] OR;  Service: Thoracic;  Laterality: Left;   NEPHROLITHOTOMY Left 02/11/2017   Procedure: NEPHROLITHOTOMY PERCUTANEOUS;  Surgeon: Ihor Gully, MD;  Location: [facility] ORS;  Service: Urology;  Laterality: Left;  PEG TUBE REMOVAL  04/13/11   PERIPHERAL VASCULAR INTERVENTION Right 03/05/2021   Procedure: PERIPHERAL VASCULAR INTERVENTION;  Surgeon: Nada Libman, MD;  Location: [facility]VAS[facility] CV LAB;  Service: Cardiovascular;  Laterality: Right;  right subclavian   THROMBECTOMY BRACHIAL ARTERY Right 03/02/2021   Procedure: THROMBECTOMY RIGHT SUBCLAVIAN, BRACHIAL, AXILLA, AND RADIAL ARTERY;  Surgeon: Cephus Shelling, MD;  Location: [facility] OR;  Service: Vascular;  Laterality: Right;   TONSILLECTOMY     right side   UPPER EXTREMITY ANGIOGRAPHY N/A 03/05/2021   Procedure: UPPER EXTREMITY ANGIOGRAPHY;  Surgeon: Nada Libman, MD;  Location: [facility]VAS[facility] CV LAB;  Service: Cardiovascular;  Laterality: N/A;   VIDEO BRONCHOSCOPY WITH RADIAL ENDOBRONCHIAL ULTRASOUND  07/20/2021    Procedure: VIDEO BRONCHOSCOPY WITH RADIAL ENDOBRONCHIAL ULTRASOUND;  Surgeon: Leslye Peer, MD;  Location: [facility] ENDOSCOPY;  Service: Pulmonary;;    Social History   Tobacco Use  Smoking Status Every Day   Current packs/day: 0.50   Average packs/day: 0.5 packs/day for 55.0 years (27.5 ttl pk-yrs)   Types: Cigarettes  Smokeless Tobacco Never  Tobacco Comments   Has not smoked since  07/14/21 ARJ 08/04/21    Social History   Substance and Sexual Activity  Alcohol Use No   Alcohol/week: 0.0 standard drinks of alcohol    Social History   Socioeconomic History   Marital status: Single    Spouse name: Not on file   Number of children: 3   Years of education: Not on file   Highest education level: Not on file  Occupational History   Not on file  Tobacco Use   Smoking status: Every Day    Current packs/day: 0.50    Average packs/day: 0.5 packs/day for 55.0 years (27.5 ttl pk-yrs)    Types: Cigarettes   Smokeless tobacco: Never   Tobacco comments:    Has not smoked since  07/14/21 ARJ 08/04/21  Vaping Use   Vaping status: Never Used  Substance and Sexual Activity   Alcohol use: No    Alcohol/week: 0.0 standard drinks of alcohol   Drug use: No   Sexual activity: Not Currently  Other Topics Concern   Not on file  Social History Narrative   Not on file   Social Determinants of Health   Financial Resource Strain: Low Risk  (04/29/2022)   Overall Financial Resource Strain (CARDIA)    Difficulty of Paying Living Expenses: Not hard at all  Food Insecurity: No Food Insecurity (04/29/2022)   Hunger Vital Sign    Worried About Running Out of Food in the Last Year: Never true    Ran Out of Food in the Last Year: Never true  Transportation Needs: No Transportation Needs (04/29/2022)   PRAPARE - Administrator, Civil Service (Medical): No    Lack of Transportation (Non-Medical): No  Physical Activity: Inactive (04/29/2022)   Exercise Vital Sign    Days of Exercise per  Week: 0 days    Minutes of Exercise per Session: 0 min  Stress: No Stress Concern Present (04/29/2022)   [employer] of Occupational Health - Occupational Stress Questionnaire    Feeling of Stress : Not at all  Social Connections: Socially Isolated (04/29/2022)   Social Connection and Isolation Panel [NHANES]    Frequency of Communication with Friends and Family: More than three times a week    Frequency of Social Gatherings with Friends and Family: More than three times a week    Attends Religious Services: Never  Active Member of Clubs or Organizations: No    Attends Banker Meetings: Never    Marital Status: Widowed  Intimate Partner Violence: Not At Risk (04/29/2022)   Humiliation, Afraid, Rape, and Kick questionnaire    Fear of Current or Ex-Partner: No    Emotionally Abused: No    Physically Abused: No    Sexually Abused: No    Allergies  Allergen Reactions   Codeine Nausea Only    Current Facility-Administered Medications  Medication Dose Route Frequency Provider Last Rate Last Admin   acetaminophen (TYLENOL) tablet 650 mg  650 mg Oral Q4H PRN Leslye Peer, MD       albuterol (PROVENTIL) (2.5 MG/3ML) 0.083% nebulizer solution 2.5 mg  2.5 mg Nebulization Q4H PRN Byrum, Les Pou, MD       arformoterol (BROVANA) nebulizer solution 15 mcg  15 mcg Nebulization BID Leslye Peer, MD   15 mcg at 12/24/2022 0747   aspirin chewable tablet 324 mg  324 mg Oral NOW Leslye Peer, MD       Or   aspirin suppository 300 mg  300 mg Rectal NOW Leslye Peer, MD       aspirin EC tablet 81 mg  81 mg Oral Daily Leslye Peer, MD   81 mg at 12/08/2022 0923   budesonide (PULMICORT) nebulizer solution 0.5 mg  0.5 mg Nebulization BID Leslye Peer, MD   0.5 mg at 12/12/2022 0749   [START ON 12/23/2022] ceFEPIme (MAXIPIME) 2 g in sodium chloride 0.9 % 100 mL IVPB  2 g Intravenous Q24H Byrum, Les Pou, MD       Chlorhexidine Gluconate Cloth 2 % PADS 6 each  6 each Topical  Daily Leslye Peer, MD   6 each at 12/25/2022 1234   clopidogrel (PLAVIX) tablet 75 mg  75 mg Oral Daily Leslye Peer, MD   75 mg at 12/08/2022 5732   docusate (COLACE) 50 MG/5ML liquid 100 mg  100 mg Per Tube BID Selmer Dominion B, NP       docusate sodium (COLACE) capsule 100 mg  100 mg Oral BID PRN Leslye Peer, MD       EPINEPHrine (ADRENALIN) 1 MG/10ML injection            fentaNYL (SUBLIMAZE) bolus via infusion 25-100 mcg  25-100 mcg Intravenous Q15 min PRN Selmer Dominion B, NP       fentaNYL in NS (75mcg/ml) infusion-PREMIX  25-200 mcg/hr Intravenous Continuous Selmer Dominion B, NP 5 mL/hr at 12/22/2022 1302 50 mcg/hr at 12/03/2022 1302   Heparin (Porcine) in NaCl 1000-0.9 UT/500ML-% SOLN    PRN Marykay Lex, MD   500 mL at 12/07/2022 1339   heparin ADULT infusion 100 units/mL (25000 units/276mL)  1,050 Units/hr Intravenous Continuous Leslye Peer, MD 10.5 mL/hr at 12/08/2022 1100 1,050 Units/hr at 12/28/2022 1100   heparin sodium (porcine) injection    PRN Marykay Lex, MD   4,000 Units at 12/14/2022 1349   hydrALAZINE (APRESOLINE) injection 10-40 mg  10-40 mg Intravenous Q4H PRN Leslye Peer, MD       insulin aspart (novoLOG) injection 0-9 Units  0-9 Units Subcutaneous Q4H Selmer Dominion B, NP       levothyroxine (SYNTHROID) tablet 112 mcg  112 mcg Oral Q0600 Leslye Peer, MD   112 mcg at 12/08/2022 0610   lidocaine (PF) (XYLOCAINE) 1 % injection    PRN Marykay Lex, MD  12 mL at 12/12/2022 1518   methylPREDNISolone sodium succinate (SOLU-MEDROL) 125 mg/2 mL injection 80 mg  80 mg Intravenous Q12H Leslye Peer, MD   80 mg at 12/29/2022 1610   metroNIDAZOLE (FLAGYL) IVPB 500 mg  500 mg Intravenous Q12H Lynnell Catalan, MD       midazolam (VERSED) 100 mg/100 mL (1 mg/mL) premix infusion  0-10 mg/hr Intravenous Continuous Selmer Dominion B, NP 2 mL/hr at 12/25/2022 1305 2 mg/hr at 12/04/2022 1305   midazolam (VERSED) bolus via infusion 0-5 mg  0-5 mg Intravenous Q1H PRN  Selmer Dominion B, NP       nitroGLYCERIN 100 mcg/mL intra-arterial injection            norepinephrine (LEVOPHED) 4mg  in (0.016 mg/mL) premix infusion  2-15 mcg/min Intravenous Titrated Leslye Peer, MD 26.3 mL/hr at 12/21/2022 1100 7 mcg/min at 12/26/2022 1100   Oral care mouth rinse  15 mL Mouth Rinse Q2H Selmer Dominion B, NP       Oral care mouth rinse  15 mL Mouth Rinse PRN Selmer Dominion B, NP       oxyCODONE (Oxy IR/ROXICODONE) immediate release tablet 5 mg  5 mg Oral Q6H PRN Leslye Peer, MD       pantoprazole (PROTONIX) injection 40 mg  40 mg Intravenous Daily Selmer Dominion B, NP       polyethylene glycol (MIRALAX / GLYCOLAX) packet 17 g  17 g Oral Daily PRN Leslye Peer, MD       polyethylene glycol (MIRALAX / GLYCOLAX) packet 17 g  17 g Per Tube Daily Norton Blizzard, NP       Radial Cocktail/Verapamil only    PRN Marykay Lex, MD   10 mL at 12/13/2022 1346   revefenacin (YUPELRI) nebulizer solution 175 mcg  175 mcg Nebulization Daily Leslye Peer, MD   175 mcg at 12/24/2022 0754   sodium chloride flush (NS) 0.9 % injection 10 mL  10 mL Intravenous Q12H Leslye Peer, MD   10 mL at 12/13/2022 9604   succinylcholine (ANECTINE) 200 MG/10ML syringe              Family History  Problem Relation Age of Onset   Cancer Father 26       colon   Cancer Sister 24       leukemia       Physical Exam: BP 104/79   Pulse (!) 127   Temp (!) 95 F (35 C) (Axillary) Comment: nurse notified and aware  Resp (!) 40   Ht 5\' 9"  (1.753 m)   Wt 68.6 kg   SpO2 98%   BMI 22.33 kg/m      Diagnostic Studies & Laboratory data: I have personally reviewed the following studies and agree with the findings     Recent Radiology Findings:   ECHOCARDIOGRAM COMPLETE  Result Date: 12/01/2022    ECHOCARDIOGRAM REPORT   Patient Name:   DEAGEN GAUNA Date of Exam: 12/22/2022 Medical Rec #:  [id number]      Height:       69.0 in Accession #:    [id number]     Weight:       151.2 lb Date  of Birth:  07/01/1955     BSA:          1.835 m Patient Age:    66 years       BP:  96/62 mmHg Patient Gender: M              HR:           75 bpm. Exam Location:  Inpatient Procedure: 2D Echo, 3D Echo, Color Doppler and Cardiac Doppler STAT ECHO REPORT CONTAINS CRITICAL RESULT Indications:    Shock  History:        Patient has prior history of Echocardiogram examinations, most                 recent 03/04/2021. COPD, Signs/Symptoms:Shortness of Breath and                 Dyspnea; Risk Factors:Current Smoker and Hypertension. Cancer.  Sonographer:    Sheralyn Boatman RDCS Referring Phys: 4 DAYNA N DUNN IMPRESSIONS  1. Left ventricular ejection fraction, by estimation, is 60 to 65%. The left ventricle has normal function. The left ventricle demonstrates regional wall motion abnormalities (see scoring diagram/findings for description). The left ventricular internal cavity size was mildly dilated. Left ventricular diastolic parameters are indeterminate. Elevated left atrial pressure. The E/e' is 18.5. The basal inferior segment is dyskinetic.  2. Right ventricular systolic function is normal. The right ventricular size is normal.  3. Large pleural effusion in the left lateral region.  4. Mobile structure (1.33 x 0.81 cm) on the anterior leaflet (on the atrial side) differential diagnosis includes but not limited to (vegetation, flail chordee / scallop) clinical correlation required . The mitral valve is abnormal. Severe mitral valve regurgitation. No evidence of mitral stenosis.  5. The aortic valve is tricuspid. Aortic valve regurgitation is not visualized. Aortic valve sclerosis/calcification is present, without any evidence of aortic stenosis.  6. The inferior vena cava is normal in size with greater than 50% respiratory variability, suggesting right atrial pressure of 3 mmHg. Comparison(s): A prior study was performed on 03/04/2021. Mitral valve findings are new compared to prior study.  Conclusion(s)/Recommendation(s): Findings concerning for mitral valve pathology , would recommend Transesophageal Echocardiogram for clarification. Findings conveyed to referring team/provider. FINDINGS  Left Ventricle: Left ventricular ejection fraction, by estimation, is 60 to 65%. The left ventricle has normal function. The left ventricle demonstrates regional wall motion abnormalities. The left ventricular internal cavity size was mildly dilated. There is no left ventricular hypertrophy. Left ventricular diastolic parameters are indeterminate. Elevated left atrial pressure. The E/e' is 18.5.  LV Wall Scoring: The basal inferior segment is dyskinetic. The basal inferior segment is dyskinetic. Right Ventricle: The right ventricular size is normal. No increase in right ventricular wall thickness. Right ventricular systolic function is normal. Left Atrium: Left atrial size was normal in size. Right Atrium: Right atrial size was normal in size. Pericardium: There is no evidence of pericardial effusion. Mitral Valve: Mobile structure (1.33 x 0.81 cm) on the anterior leaflet (on the atrial side) differential diagnosis includes but not limited to (vegetation, flail chordee / scallop) clinical correlation required. The mitral valve is abnormal. Severe mitral valve regurgitation. No evidence of mitral valve stenosis. MV peak gradient, 8.1 mmHg. The mean mitral valve gradient is 3.0 mmHg. Tricuspid Valve: The tricuspid valve is grossly normal. Tricuspid valve regurgitation is trivial. No evidence of tricuspid stenosis. Aortic Valve: The aortic valve is tricuspid. Aortic valve regurgitation is not visualized. Aortic valve sclerosis/calcification is present, without any evidence of aortic stenosis. Pulmonic Valve: The pulmonic valve was normal in structure. Pulmonic valve regurgitation is not visualized. No evidence of pulmonic stenosis. Aorta: The aortic root and ascending aorta are structurally normal, with  no evidence of  dilitation. Venous: A pattern of systolic flow reversal, suggestive of severe mitral regurgitation is recorded from the right upper pulmonary vein. The inferior vena cava is normal in size with greater than 50% respiratory variability, suggesting right atrial pressure of 3 mmHg. IAS/Shunts: The atrial septum is grossly normal. Additional Comments: There is a large pleural effusion in the left lateral region.  LEFT VENTRICLE PLAX 2D LVIDd:         6.50 cm      Diastology LVIDs:         3.80 cm      LV e' medial:    5.87 cm/s LV PW:         0.90 cm      LV E/e' medial:  23.2 LV IVS:        0.90 cm      LV e' lateral:   9.46 cm/s LVOT diam:     2.20 cm      LV E/e' lateral: 14.4 LV SV:         69 LV SV Index:   38 LVOT Area:     3.80 cm  LV Volumes (MOD) LV vol d, MOD A2C: 166.5 ml LV vol d, MOD A4C: 142.0 ml LV vol s, MOD A2C: 76.6 ml LV vol s, MOD A4C: 53.8 ml LV SV MOD A2C:     89.8 ml LV SV MOD A4C:     142.0 ml LV SV MOD BP:      88.9 ml RIGHT VENTRICLE             IVC RV S prime:     12.50 cm/s  IVC diam: 1.70 cm TAPSE (M-mode): 1.8 cm LEFT ATRIUM             Index        RIGHT ATRIUM           Index LA diam:        5.30 cm 2.89 cm/m   RA Area:     14.50 cm LA Vol (A2C):   38.4 ml 20.93 ml/m  RA Volume:   32.20 ml  17.55 ml/m LA Vol (A4C):   72.0 ml 39.25 ml/m LA Biplane Vol: 52.7 ml 28.73 ml/m  AORTIC VALVE LVOT Vmax:   110.00 cm/s LVOT Vmean:  67.900 cm/s LVOT VTI:    0.181 m  AORTA Ao Root diam: 3.10 cm Ao Asc diam:  3.30 cm MITRAL VALVE MV Area (PHT): 5.02 cm       SHUNTS MV Area VTI:   1.64 cm       Systemic VTI:  0.18 m MV Peak grad:  8.1 mmHg       Systemic Diam: 2.20 cm MV Mean grad:  3.0 mmHg MV Vmax:       1.42 m/s MV Vmean:      78.0 cm/s MV Decel Time: 151 msec MR Peak grad:    88.4 mmHg MR Mean grad:    45.0 mmHg MR Vmax:         470.00 cm/s MR Vmean:        313.0 cm/s MR PISA:         4.54 cm MR PISA Eff ROA: 37 mm MR PISA Radius:  0.85 cm MV E velocity: 136.00 cm/s MV A velocity: 79.60  cm/s MV E/A ratio:  1.71 Sunit Tolia Electronically signed by Tessa Lerner Signature Date/Time: 12/02/2022/11:29:09 AM    Final    DG Chest [facility]  1 View  Result Date: 12/26/2022 CLINICAL DATA:  67 year old male history of acute respiratory failure. Shortness of breath. EXAM: PORTABLE CHEST 1 VIEW COMPARISON:  Chest x-ray 12-29-22. FINDINGS: Confluent opacity at the left base which may reflect atelectasis and/or consolidation, with superimposed small left pleural effusion, which is decreased compared to the prior study. Widespread interstitial prominence, diffuse peribronchial cuffing and ill-defined airspace disease noted throughout the right lung, most confluent throughout the right mid to lower lung. No pneumothorax. Heart size is normal. The patient is rotated to the left on today's exam, resulting in distortion of the mediastinal contours and reduced diagnostic sensitivity and specificity for mediastinal pathology. Atherosclerotic calcifications in the thoracic aorta. IMPRESSION: 1. Decreasing left-sided pleural effusion. Radiographic appearance the chest is otherwise unchanged, as above. Electronically Signed   By: Trudie Reed M.D.   On: 12/20/2022 06:11   DG Chest Port 1 View  Result Date: 12/29/2022 CLINICAL DATA:  Shortness of breath.  Wheezing. EXAM: PORTABLE CHEST 1 VIEW COMPARISON:  08/04/2021 FINDINGS: Moderate to large left pleural effusion with obscuration of the left hemidiaphragm. Asymmetric interstitial and airspace opacity in the right lung with indistinctness of pulmonary vasculature especially on the right. A right subclavian stent is noted. Atherosclerotic calcification of the aortic arch. Heart size within normal limits. IMPRESSION: 1. Moderate to large left pleural effusion. 2. Asymmetric interstitial and airspace opacity in the right lung with indistinctness of pulmonary vasculature especially on the right. Possibilities include asymmetric edema, asymmetric atypical pneumonia,  or pulmonary embolus. Electronically Signed   By: [name] M.D.   On: December 29, 2022 20:20      Recent Lab Findings: Lab Results  Component Value Date   WBC 42.5 (H) 12/30/2022   HGB 11.9 (L) 12/21/2022   HCT 35.0 (L) 12/02/2022   PLT 555 (H) 12/19/2022   GLUCOSE 162 (H) 12/19/2022   CHOL 96 (L) 06/16/2021   TRIG 92 06/16/2021   HDL 26 (L) 06/16/2021   LDLCALC 52 06/16/2021   ALT 13 2022-12-29   AST 14 (L) December 29, 2022   NA 140 12/16/2022   K 4.6 12/06/2022   CL 103 12/17/2022   CREATININE 2.73 (H) 12/24/2022   BUN 30 (H) 12/23/2022   CO2 17 (L) 12/27/2022   TSH 2.680 11/03/2022   INR 1.0 07/16/2021   HGBA1C 5.3 06/16/2021      Assessment / Plan:     Unfortunate 67 yo with cardiogenic shock and with ruptured papillary muscle and CAD. Pt with multiple cormorbidities. Discussed with daughter, Zella Ball on phone who actually admitted to her dad's feeling of not wanting to live actually.. I told her he is not a surgical candidate with reasonable chance of survival at least with a long protacted course and possiblity of long ventilator need and dialysis and she understands why we are not proceeding.    I have spent 45 min in review of the records, viewing studies and in face to face with patient and in coordination of future care    Eugenio Hoes 12/03/2022 3:45 PM

## 2022-12-10 NOTE — Progress Notes (Signed)
9Fr IABP removed @ 6:25pm continuous pressure held for 30 mins, guaze and Tegaderm applied to site, pt intubated, 2H RN viewed site, no bleeding or hematoma Site level:0 DP pulse +1

## 2022-12-10 NOTE — Progress Notes (Signed)
PCCM Interval Note  7 yoM smoker, hx COPD, oropharyngeal cancer s/p chemoradiation, HTN, right [place] stent stenosis/ PAD transferred,from [facility] with respiratory failure on BiPAP, NSTEMI w concerns for inferior STE, AKI, and progressive cardiogenic and septic shock.  Echo noted MV echodensity, new from prior imaging with new WMA.     Pt arrived from [facility] acutely ill appearing, diaphoretic, cool extremiteis, with ongoing WOB despite BiPAP.  Had episode of vomiting without nausea in transport, apparently [facility] able to remove mask prior to aspiration.  Pt awake, but in distress, diaphoretic and cool extremities, increased RLL rhonchi with labored breathing on BiPAP, airhungry.    Discussed with attending and cardiology.  Plans to intubate, stabilize, and cardiology taking for emergent LHC.    Daughter, Zella Ball called and updated of events.  Planning to come to hospital.    Plan:  - intubate now, LTVV - 2 amps bicarb prior to intubation  - send trach asp - CXR/ ABG post intubation - cont abx as schedule - CVL placement deferred > will be done in cath lab by cardiology, lab ready - cont NE for MAP goal > 65 - pending LHC    CCT 25 mins   Posey Boyer, MSN, AG-ACNP-BC [facility] 01-09-23, 1:09 PM  See Amion for pager If no response to pager , please call [phone] until 7pm After 7:00 pm call Elink  [phone]         Posey Boyer, MSN, AG-ACNP-BC [facility] 01/09/2023, 12:51 PM  See Amion for pager If no response to pager , please call [phone] until 7pm After 7:00 pm call Elink  [phone]

## 2022-12-10 NOTE — Progress Notes (Signed)
Pharmacy Antibiotic Note  [name] [name] is a 67 y.o. male admitted on 12/01/2022 with pneumonia.  Pharmacy has been consulted for Cefepime + Vancomycin dosing.  Today, 12/14/2022 On pressors, WBC elevated, and SCr up to 2.73 and est CrCl 26 ml/min necessitating renal dose adjustment of cefepime and vancomycin.  Plan: Change to Cefepime 2 gm IV q24h Change Vancomycin to 1 gm IV q36h to target AUC 400-500.  Estimated AUC on this regimen using current parameters = 495.  Monitor clinical progress, renal function, vancomycin levels as indicated F/U C&S, abx deescalation / LOT    Height: 5\' 9"  (175.3 cm) Weight: 68.6 kg (151 lb 3.8 oz) IBW/kg (Calculated) : 70.7  Temp (24hrs), Avg:97 F (36.1 C), Min:96.6 F (35.9 C), Max:97.6 F (36.4 C)  Recent Labs  Lab 12/16/2022 1829 12/19/2022 1911 12/02/2022 2031 12/15/2022 0330 12/01/2022 0520  WBC 25.3*  --   --  42.5*  --   CREATININE 1.70*  --   --   --  2.73*  LATICACIDVEN  --  6.2* 3.6*  --   --     Estimated Creatinine Clearance: 25.5 mL/min (A) (by C-G formula based on SCr of 2.73 mg/dL (H)).    Allergies  Allergen Reactions   Codeine Nausea Only    Antimicrobials this admission: 10/10 Cefepime >>  10/10 Vancomycin >>   Dose adjustments this admission: 10/11 - change cefepime to q24h & vanc to q36h   Microbiology results: 10/10 BCx: sent MRSA PCR: detected Resp PCR: neg  Thank you for allowing pharmacy to be a part of this patient's care.  Lynden Ang PharmD, BCPS 12/30/2022 7:38 AM

## 2022-12-10 NOTE — Progress Notes (Signed)
PHARMACY - PHYSICIAN COMMUNICATION CRITICAL VALUE ALERT - BLOOD CULTURE IDENTIFICATION (BCID)  CANTON YEARBY is an 67 y.o. male who presented to [facility] on 12/23/2022 with a chief complaint of cardiogenic shock.  Assessment:  Bcx 1of 3 growing staph epidermidis, possible contamination. Patient may be transitioning to comfort care.   Name of physician (or Provider) Contacted: Dr. Marlane Mingle  Current antibiotics: Cefepime   Changes to prescribed antibiotics recommended:  Patient is on recommended antibiotics - No changes needed  No results found for this or any previous visit.   Alphia Moh, PharmD, BCPS, [facility] Clinical Pharmacist  Please check AMION for all [facility] Pharmacy phone numbers After 10:00 PM, call Main Pharmacy [phone]'

## 2022-12-10 NOTE — Progress Notes (Signed)
Echocardiogram 2D Echocardiogram has been performed. Results witnessed by cardiologist.   Sheralyn Boatman R 12/22/2022, 9:40 AM

## 2022-12-10 NOTE — Progress Notes (Signed)
PHARMACY - ANTICOAGULATION CONSULT NOTE  Pharmacy Consult for heparin  Indication: ACS/STEMI  Allergies  Allergen Reactions   Codeine Nausea Only    Patient Measurements: Height: 5\' 9"  (175.3 cm) Weight: 68.6 kg (151 lb 3.8 oz) IBW/kg (Calculated) : 70.7 Heparin Dosing Weight: 71.2 kg  Vital Signs: Temp: 96.6 F (35.9 C) (10/11 0320) Temp Source: Axillary (10/11 0320) BP: 83/55 (10/11 0400) Pulse Rate: 66 (10/11 0400)  Labs: Recent Labs    12/17/2022 1829 12/22/2022 2030 12/08/2022 0330  HGB 11.5*  --  10.9*  HCT 42.0  --  40.3  PLT 536*  --  555*  HEPARINUNFRC  --   --  0.13*  CREATININE 1.70*  --   --   TROPONINIHS 518* 636*  --     Estimated Creatinine Clearance: 40.9 mL/min (A) (by C-G formula based on SCr of 1.7 mg/dL (H)).   Medical History: Past Medical History:  Diagnosis Date   Arthritis    Chronic right ear pain 07/2019   Coronary artery disease    Depression 06/12/11   Buproprion per Dr. [name]   Drainage from ear, right 07/2019   Dyspnea    Full dentures    Fitting Per Dr. 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 of Cisplatin and Dhydrtion   Skin rash 01/2019   Smoking 06/12/11   1/2 pack/day   Status post chemotherapy 10/30/10 - 11/09/10    2 doses of Q 3 week Cisplatin   Status post radiation therapy 10/11/10 - 12/24/10   Bilateral Neck and Mucosa axis /  70 gray in 35 fractions   Xerostomia     Assessment: Pharmacy consulted to dose heparin for ACS/STEMI.  Hg 11.5, PLT 536, SCr 1.7, troponin 518 No anticoagulants PTA   12/24/2022: Initial heparin level 0.13; sub-therapeutic on IV heparin 850 units/hr CBC: Hg 10.9 low/stable; pltc 555- elevated/stable No bleeding or infusion related concerns reported by RN   Goal of Therapy:  Heparin level 0.3-0.7  units/ml Monitor platelets by anticoagulation protocol: Yes   Plan:  Heparin bolus 2000 units IV x 1  Increase Heparin infusion to 1050 units/hr Check 8 hr heparin level after rate increase Daily CBC & heparin level while on heparin F/U long-term anticoagulation plans  Junita Push, PharmD, BCPS 12/07/2022 4:27 AM

## 2022-12-10 NOTE — Progress Notes (Signed)
December 29, 2022 0751  Oxygen Therapy/Pulse Ox  O2 Device Nasal Cannula  O2 Therapy Oxygen  O2 Flow Rate (L/min) (S)  5 L/min (RT placed pt on 5 L [place] for a trial off of BIPAP , SP02 dropped significantly to 87% fast, placed pt back on the bipap, Sp02 improved to 93% within 1 minute.  Will continue bipap at this time.)  FiO2 (%) 40 %  SpO2 (!) 87 %

## 2022-12-10 NOTE — Progress Notes (Addendum)
eLink Physician-Brief Progress Note Patient Name: [name] [name] DOB: 03/04/1955 MRN: [id number]   Date of Service  12/15/2022  HPI/Events of Note  Pharmacist  want to stop heparin drip, since patient is off of AIBP. As per Notes, [name] [name] is now DNR, discussion going on for comfort care.  Camera: On Vent.   eICU Interventions  Do not have a fib or other indication for continue heparin for now. Ok to stop.     Intervention Category Minor Interventions: Communication with other healthcare providers and/or family  Ranee Gosselin 12/03/2022, 8:04 PM  22:29  Changed levo drip from PIV to central venous cath titration goals . Blood cx staph epidermidis, per Pharmacy. No changes needed for this.

## 2022-12-10 NOTE — Consult Note (Addendum)
Advanced Heart Failure Team Consult Note   Primary Physician: Ivonne Andrew, NP PCP-Cardiologist:  Nicki Guadalajara, MD  Reason for Consultation: Cardiogenic shock, severe MR  HPI:    [name] [name] is seen today for evaluation of cardiogenic shock and severe MR at the request of Dr. Herbie Baltimore with [facility]. 67 y.o. 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 Initial ABG 7.0/7/59/206. Placed on BiPAP. Presentation initially appeared consistent with COPD exacerbation. Started on antibiotics, bronchodilators and antibiotics. Xray suggestive of pulmonary edema.  Troponin 518>636>1,186>1,283, lactic acid > 6, WBCs up to 25K, BNP 1,307, procalcitonin 1.6. He was admitted to the ICU under CCM. Patient initially hypertensive and tachycardic. Subsequently developed hypotension requiring pressors. ECG with interior ST elevations and ST depressions in leads I, aVL, V4-V6.  Cardiology consulted. Bedside echo with new mitral valve echodensity and significant MR with splay artifact, LV function okay with basal inferior and inferolateral WMA. He was transferred to [facility] for further management of respiratory failure, shock and acute MI. On arrival to [facility], he appeared ill and in acute respiratory distress. He was intubated and taken for emergent LHC.  Echo today: EF 60-65%, RV okay, mobile structure on anterior leaflet of mitral valve, severe MR     Home Medications Prior to Admission medications   Medication Sig Start Date End Date Taking? Authorizing Provider  albuterol (VENTOLIN HFA) 108 (90 Base) MCG/ACT inhaler INHALE 2 PUFFS INTO THE LUNGS  EVERY 4 (FOUR) HOURS AS NEEDED FOR WHEEZING OR SHORTNESS OF BREATH (COUGH, SHORTNESS OF BREATH OR WHEEZING.). 08/18/22 08/18/23  Ivonne Andrew, NP  ASPIRIN LOW DOSE 81 MG tablet TAKE 1 TABLET(81 MG) BY MOUTH DAILY. SWALLOW WHOLE 03/16/22   Chuck Hint, MD  atorvastatin (LIPITOR) 40 MG tablet Take 1 tablet (40 mg total) by mouth daily. 11/24/22   Ivonne Andrew, NP  Budeson-Glycopyrrol-Formoterol (BREZTRI AEROSPHERE) 160-9-4.8 MCG/ACT AERO Inhale 2 puffs into the lungs in the morning and at bedtime. Patient taking differently: Inhale 2 puffs into the lungs every morning. 08/04/21   Leslye Peer, MD  budesonide-formoterol (SYMBICORT) 80-4.5 MCG/ACT inhaler INHALE 2 PUFFS INTO THE LUNGS 2 (TWO) TIMES DAILY. 08/18/22 08/18/23  Ivonne Andrew, NP  clopidogrel (PLAVIX) 75 MG tablet TAKE 1 TABLET(75 MG) BY MOUTH DAILY 11/22/22   Cephus Shelling, MD  docusate sodium (COLACE) 100 MG capsule Take 100 mg by mouth at bedtime.    [provider]  escitalopram (LEXAPRO) 10 MG tablet Take 1 tablet (10 mg total) by mouth daily. 11/30/22   Donell Beers, FNP  hydrALAZINE (APRESOLINE) 25 MG tablet Take 1 tablet (25 mg total) by mouth 3 (three) times daily. 11/30/22   Donell Beers, FNP  levothyroxine (SYNTHROID) 112 MCG tablet Take 1 tablet (112 mcg total) by mouth daily. 11/24/22 11/24/23  Ivonne Andrew, NP  metoprolol tartrate (LOPRESSOR) 25 MG tablet Take 1 tablet (25 mg total) by mouth 2 (two) times daily. 11/24/22   Ivonne Andrew, NP  oxybutynin (DITROPAN) 5 MG tablet Take 1 tablet (5 mg total) by mouth 3 (three)  times daily as needed (bladder spasm/leakage around catheter). 09/16/21     oxyCODONE (OXY  IR/ROXICODONE) 5 MG immediate release tablet Take 1 tablet (5 mg total) by mouth every 6 (six) hours as needed for moderate pain or severe pain. 11/30/22   Paseda, Baird Kay, FNP  Vitamin D, Ergocalciferol, (DRISDOL) 1.25 MG (50000 UNIT) CAPS capsule TAKE 1 CAPSULE BY MOUTH  EVERY 7 DAYS. 11/24/22 11/24/23  Ivonne Andrew, NP    Past Medical History: Past Medical History:  Diagnosis Date   Arthritis    Chronic right ear pain 07/2019   Coronary artery disease    Depression 06/12/11   Buproprion per Dr. [name]   Drainage from ear, right 07/2019   Dyspnea    Full dentures    Fitting Per Dr. 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 of Cisplatin and Dhydrtion   Skin rash 01/2019   Smoking 06/12/11   1/2 pack/day   Status post chemotherapy 10/30/10 - 11/09/10    2 doses of Q 3 week Cisplatin   Status post radiation therapy 10/11/10 - 12/24/10   Bilateral Neck and Mucosa axis /  70 gray in 35 fractions   Xerostomia     Past Surgical History: Past Surgical History:  Procedure Laterality Date   APPENDECTOMY     BIOPSY TONGUE     TonsilandBaseoftongue   BRONCHIAL BRUSHINGS  07/20/2021   Procedure: BRONCHIAL BRUSHINGS;  Surgeon: Leslye Peer, MD;  Location: [facility] ENDOSCOPY;  Service: Pulmonary;;   BRONCHIAL NEEDLE ASPIRATION BIOPSY  07/20/2021   Procedure: BRONCHIAL NEEDLE ASPIRATION BIOPSIES;  Surgeon: Leslye Peer, MD;  Location: [facility] ENDOSCOPY;  Service: Pulmonary;;   FIDUCIAL MARKER PLACEMENT  07/20/2021   Procedure: FIDUCIAL DYE MARKING;  Surgeon: Leslye Peer, MD;  Location: [facility] ENDOSCOPY;  Service: Pulmonary;;   HERNIA REPAIR     umbilical with mesh   INTERCOSTAL NERVE BLOCK Left 07/20/2021   Procedure: INTERCOSTAL NERVE BLOCK;  Surgeon: Corliss Skains, MD;  Location: [facility] OR;  Service: Thoracic;  Laterality: Left;   IR NEPHROSTOMY EXCHANGE LEFT  01/10/2017   IR NEPHROSTOMY PLACEMENT LEFT  11/15/2016   LEFT HEART CATH AND CORONARY ANGIOGRAPHY N/A 01/18/2017   Procedure: LEFT HEART CATH AND CORONARY ANGIOGRAPHY;  Surgeon: Lennette Bihari, MD;  Location: [facility] INVASIVE CV  LAB;  Service: Cardiovascular;  Laterality: N/A;   LYMPH NODE BIOPSY Left 07/20/2021   Procedure: LYMPH NODE BIOPSY;  Surgeon: Corliss Skains, MD;  Location: [facility] OR;  Service: Thoracic;  Laterality: Left;   NEPHROLITHOTOMY Left 02/11/2017   Procedure: NEPHROLITHOTOMY PERCUTANEOUS;  Surgeon: Ihor Gully, MD;  Location: [facility] ORS;  Service: Urology;  Laterality: Left;   PEG TUBE REMOVAL  04/13/11   PERIPHERAL VASCULAR INTERVENTION Right 03/05/2021   Procedure: PERIPHERAL VASCULAR INTERVENTION;  Surgeon: Nada Libman, MD;  Location: [facility] INVASIVE CV LAB;  Service: Cardiovascular;  Laterality: Right;  right subclavian   THROMBECTOMY BRACHIAL ARTERY Right 03/02/2021   Procedure: THROMBECTOMY RIGHT SUBCLAVIAN, BRACHIAL, AXILLA, AND RADIAL ARTERY;  Surgeon: Cephus Shelling, MD;  Location: [facility] OR;  Service: Vascular;  Laterality: Right;   TONSILLECTOMY     right side   UPPER EXTREMITY ANGIOGRAPHY N/A 03/05/2021   Procedure: UPPER EXTREMITY ANGIOGRAPHY;  Surgeon: Nada Libman, MD;  Location: [facility] INVASIVE CV LAB;  Service: Cardiovascular;  Laterality: N/A;   VIDEO  BRONCHOSCOPY WITH RADIAL ENDOBRONCHIAL ULTRASOUND  07/20/2021   Procedure: VIDEO BRONCHOSCOPY WITH RADIAL ENDOBRONCHIAL ULTRASOUND;  Surgeon: Leslye Peer, MD;  Location: [facility] ENDOSCOPY;  Service: Pulmonary;;    Family History: Family History  Problem Relation Age of Onset   Cancer Father 97       colon   Cancer Sister 82       leukemia    Social History: Social History   Socioeconomic History   Marital status: Single    Spouse name: Not on file   Number of children: 3   Years of education: Not on file   Highest education level: Not on file  Occupational History   Not on file  Tobacco Use   Smoking status: Every Day    Current packs/day: 0.50    Average packs/day: 0.5 packs/day for 55.0 years (27.5 ttl pk-yrs)    Types: Cigarettes   Smokeless tobacco: Never   Tobacco comments:    Has not smoked since  07/14/21 [facility]  08/04/21  Vaping Use   Vaping status: Never Used  Substance and Sexual Activity   Alcohol use: No    Alcohol/week: 0.0 standard drinks of alcohol   Drug use: No   Sexual activity: Not Currently  Other Topics Concern   Not on file  Social History Narrative   Not on file   Social Determinants of Health   Financial Resource Strain: Low Risk  (04/29/2022)   Overall Financial Resource Strain (CARDIA)    Difficulty of Paying Living Expenses: Not hard at all  Food Insecurity: No Food Insecurity (04/29/2022)   Hunger Vital Sign    Worried About Running Out of Food in the Last Year: Never true    Ran Out of Food in the Last Year: Never true  Transportation Needs: No Transportation Needs (04/29/2022)   PRAPARE - Administrator, Civil Service (Medical): No    Lack of Transportation (Non-Medical): No  Physical Activity: Inactive (04/29/2022)   Exercise Vital Sign    Days of Exercise per Week: 0 days    Minutes of Exercise per Session: 0 min  Stress: No Stress Concern Present (04/29/2022)   [employer] of Occupational Health - Occupational Stress Questionnaire    Feeling of Stress : Not at all  Social Connections: Socially Isolated (04/29/2022)   Social Connection and Isolation Panel [NHANES]    Frequency of Communication with Friends and Family: More than three times a week    Frequency of Social Gatherings with Friends and Family: More than three times a week    Attends Religious Services: Never    Database administrator or Organizations: No    Attends Banker Meetings: Never    Marital Status: Widowed    Allergies:  Allergies  Allergen Reactions   Codeine Nausea Only    Objective:    Vital Signs:   Temp:  [95 F (35 C)-97.7 F (36.5 C)] 95 F (35 C) (10/11 1225) Pulse Rate:  [60-127] 127 (10/11 1259) Resp:  [19-40] 40 (10/11 1208) BP: (68-173)/(43-159) 104/79 (10/11 1259) SpO2:  [74 %-100 %] 98 % (10/11 1259) FiO2 (%):  [40 %-100 %] 100 % (10/11  1259) Weight:  [68.6 kg-71.2 kg] 68.6 kg (10/11 0500) Last BM Date :  (PTA)  Weight change: Filed Weights   2022-12-20 1821 12/07/2022 0046 12/27/2022 0500  Weight: 71.2 kg 68.6 kg 68.6 kg    Intake/Output:   Intake/Output Summary (Last 24 hours) at 12/06/2022 1359 Last data  filed at 12/28/2022 1100 Gross per 24 hour  Intake 593.1 ml  Output --  Net 593.1 ml      Physical Exam    General:  Critically ill appearing, on cath table HEENT: + ETT Neck: supple. JVP elevated Extremities: no cyanosis, clubbing, rash, edema Neuro: Sedated on vent  EKG    10/10 18:40: Sinus tach 108 bpm, slight ST elevation inferior leads, ST depression, I, aVL, V4-V6  Labs   Basic Metabolic Panel: Recent Labs  Lab 12-12-2022 1829 12/19/2022 0520  NA 138 137  K 4.6 4.9  CL 107 103  CO2 19* 17*  GLUCOSE 178* 162*  BUN 16 30*  CREATININE 1.70* 2.73*  CALCIUM 8.8* 8.4*  MG  --  2.8*  PHOS  --  7.6*    Liver Function Tests: Recent Labs  Lab 12/12/2022 1829  AST 14*  ALT 13  ALKPHOS 99  BILITOT 0.6  PROT 7.2  ALBUMIN 3.3*   No results for input(s): "LIPASE", "AMYLASE" in the last 168 hours. No results for input(s): "AMMONIA" in the last 168 hours.  CBC: Recent Labs  Lab 12-Dec-2022 1829 12/08/2022 0330  WBC 25.3* 42.5*  NEUTROABS 20.9*  --   HGB 11.5* 10.9*  HCT 42.0 40.3  MCV 72.9* 74.5*  PLT 536* 555*    Cardiac Enzymes: No results for input(s): "CKTOTAL", "CKMB", "CKMBINDEX", "TROPONINI" in the last 168 hours.  BNP: BNP (last 3 results) Recent Labs    12/12/2022 1829 12/27/2022 0950  BNP 1,307.3* 2,009.1*    ProBNP (last 3 results) No results for input(s): "PROBNP" in the last 8760 hours.   CBG: No results for input(s): "GLUCAP" in the last 168 hours.  Coagulation Studies: No results for input(s): "LABPROT", "INR" in the last 72 hours.   Imaging   ECHOCARDIOGRAM COMPLETE  Result Date: 12/18/2022    ECHOCARDIOGRAM REPORT   Patient Name:   TRINTON MACFADYEN Date of  Exam: 12/16/2022 Medical Rec #:  [id number]      Height:       69.0 in Accession #:    [id number]     Weight:       151.2 lb Date of Birth:  1955-07-15     BSA:          1.835 m Patient Age:    50 years       BP:           96/62 mmHg Patient Gender: M              HR:           75 bpm. Exam Location:  Inpatient Procedure: 2D Echo, 3D Echo, Color Doppler and Cardiac Doppler STAT ECHO REPORT CONTAINS CRITICAL RESULT Indications:    Shock  History:        Patient has prior history of Echocardiogram examinations, most                 recent 03/04/2021. COPD, Signs/Symptoms:Shortness of Breath and                 Dyspnea; Risk Factors:Current Smoker and Hypertension. Cancer.  Sonographer:    Sheralyn Boatman RDCS Referring Phys: 68 DAYNA N DUNN IMPRESSIONS  1. Left ventricular ejection fraction, by estimation, is 60 to 65%. The left ventricle has normal function. The left ventricle demonstrates regional wall motion abnormalities (see scoring diagram/findings for description). The left ventricular internal cavity size was mildly dilated. Left ventricular diastolic parameters are indeterminate. Elevated left atrial  pressure. The E/e' is 18.5. The basal inferior segment is dyskinetic.  2. Right ventricular systolic function is normal. The right ventricular size is normal.  3. Large pleural effusion in the left lateral region.  4. Mobile structure (1.33 x 0.81 cm) on the anterior leaflet (on the atrial side) differential diagnosis includes but not limited to (vegetation, flail chordee / scallop) clinical correlation required . The mitral valve is abnormal. Severe mitral valve regurgitation. No evidence of mitral stenosis.  5. The aortic valve is tricuspid. Aortic valve regurgitation is not visualized. Aortic valve sclerosis/calcification is present, without any evidence of aortic stenosis.  6. The inferior vena cava is normal in size with greater than 50% respiratory variability, suggesting right atrial pressure of 3 mmHg.  Comparison(s): A prior study was performed on 03/04/2021. Mitral valve findings are new compared to prior study. Conclusion(s)/Recommendation(s): Findings concerning for mitral valve pathology , would recommend Transesophageal Echocardiogram for clarification. Findings conveyed to referring team/provider. FINDINGS  Left Ventricle: Left ventricular ejection fraction, by estimation, is 60 to 65%. The left ventricle has normal function. The left ventricle demonstrates regional wall motion abnormalities. The left ventricular internal cavity size was mildly dilated. There is no left ventricular hypertrophy. Left ventricular diastolic parameters are indeterminate. Elevated left atrial pressure. The E/e' is 18.5.  LV Wall Scoring: The basal inferior segment is dyskinetic. The basal inferior segment is dyskinetic. Right Ventricle: The right ventricular size is normal. No increase in right ventricular wall thickness. Right ventricular systolic function is normal. Left Atrium: Left atrial size was normal in size. Right Atrium: Right atrial size was normal in size. Pericardium: There is no evidence of pericardial effusion. Mitral Valve: Mobile structure (1.33 x 0.81 cm) on the anterior leaflet (on the atrial side) differential diagnosis includes but not limited to (vegetation, flail chordee / scallop) clinical correlation required. The mitral valve is abnormal. Severe mitral valve regurgitation. No evidence of mitral valve stenosis. MV peak gradient, 8.1 mmHg. The mean mitral valve gradient is 3.0 mmHg. Tricuspid Valve: The tricuspid valve is grossly normal. Tricuspid valve regurgitation is trivial. No evidence of tricuspid stenosis. Aortic Valve: The aortic valve is tricuspid. Aortic valve regurgitation is not visualized. Aortic valve sclerosis/calcification is present, without any evidence of aortic stenosis. Pulmonic Valve: The pulmonic valve was normal in structure. Pulmonic valve regurgitation is not visualized. No  evidence of pulmonic stenosis. Aorta: The aortic root and ascending aorta are structurally normal, with no evidence of dilitation. Venous: A pattern of systolic flow reversal, suggestive of severe mitral regurgitation is recorded from the right upper pulmonary vein. The inferior vena cava is normal in size with greater than 50% respiratory variability, suggesting right atrial pressure of 3 mmHg. IAS/Shunts: The atrial septum is grossly normal. Additional Comments: There is a large pleural effusion in the left lateral region.  LEFT VENTRICLE PLAX 2D LVIDd:         6.50 cm      Diastology LVIDs:         3.80 cm      LV e' medial:    5.87 cm/s LV PW:         0.90 cm      LV E/e' medial:  23.2 LV IVS:        0.90 cm      LV e' lateral:   9.46 cm/s LVOT diam:     2.20 cm      LV E/e' lateral: 14.4 LV SV:  69 LV SV Index:   38 LVOT Area:     3.80 cm  LV Volumes (MOD) LV vol d, MOD A2C: 166.5 ml LV vol d, MOD A4C: 142.0 ml LV vol s, MOD A2C: 76.6 ml LV vol s, MOD A4C: 53.8 ml LV SV MOD A2C:     89.8 ml LV SV MOD A4C:     142.0 ml LV SV MOD BP:      88.9 ml RIGHT VENTRICLE             IVC RV S prime:     12.50 cm/s  IVC diam: 1.70 cm TAPSE (M-mode): 1.8 cm LEFT ATRIUM             Index        RIGHT ATRIUM           Index LA diam:        5.30 cm 2.89 cm/m   RA Area:     14.50 cm LA Vol (A2C):   38.4 ml 20.93 ml/m  RA Volume:   32.20 ml  17.55 ml/m LA Vol (A4C):   72.0 ml 39.25 ml/m LA Biplane Vol: 52.7 ml 28.73 ml/m  AORTIC VALVE LVOT Vmax:   110.00 cm/s LVOT Vmean:  67.900 cm/s LVOT VTI:    0.181 m  AORTA Ao Root diam: 3.10 cm Ao Asc diam:  3.30 cm MITRAL VALVE MV Area (PHT): 5.02 cm       SHUNTS MV Area VTI:   1.64 cm       Systemic VTI:  0.18 m MV Peak grad:  8.1 mmHg       Systemic Diam: 2.20 cm MV Mean grad:  3.0 mmHg MV Vmax:       1.42 m/s MV Vmean:      78.0 cm/s MV Decel Time: 151 msec MR Peak grad:    88.4 mmHg MR Mean grad:    45.0 mmHg MR Vmax:         470.00 cm/s MR Vmean:        313.0 cm/s MR  PISA:         4.54 cm MR PISA Eff ROA: 37 mm MR PISA Radius:  0.85 cm MV E velocity: 136.00 cm/s MV A velocity: 79.60 cm/s MV E/A ratio:  1.71 Sunit Tolia Electronically signed by Tessa Lerner Signature Date/Time: 12/16/22/11:29:09 AM    Final    DG Chest Port 1 View  Result Date: December 16, 2022 CLINICAL DATA:  67 year old male history of acute respiratory failure. Shortness of breath. EXAM: PORTABLE CHEST 1 VIEW COMPARISON:  Chest x-ray 12/03/2022. FINDINGS: Confluent opacity at the left base which may reflect atelectasis and/or consolidation, with superimposed small left pleural effusion, which is decreased compared to the prior study. Widespread interstitial prominence, diffuse peribronchial cuffing and ill-defined airspace disease noted throughout the right lung, most confluent throughout the right mid to lower lung. No pneumothorax. Heart size is normal. The patient is rotated to the left on today's exam, resulting in distortion of the mediastinal contours and reduced diagnostic sensitivity and specificity for mediastinal pathology. Atherosclerotic calcifications in the thoracic aorta. IMPRESSION: 1. Decreasing left-sided pleural effusion. Radiographic appearance the chest is otherwise unchanged, as above. Electronically Signed   By: Trudie Reed M.D.   On: 12/16/22 06:11   DG Chest Port 1 View  Result Date: 12/22/2022 CLINICAL DATA:  Shortness of breath.  Wheezing. EXAM: PORTABLE CHEST 1 VIEW COMPARISON:  08/04/2021 FINDINGS: Moderate to large left pleural effusion with obscuration of  the left hemidiaphragm. Asymmetric interstitial and airspace opacity in the right lung with indistinctness of pulmonary vasculature especially on the right. A right subclavian stent is noted. Atherosclerotic calcification of the aortic arch. Heart size within normal limits. IMPRESSION: 1. Moderate to large left pleural effusion. 2. Asymmetric interstitial and airspace opacity in the right lung with indistinctness  of pulmonary vasculature especially on the right. Possibilities include asymmetric edema, asymmetric atypical pneumonia, or pulmonary embolus. Electronically Signed   By: [name] M.D.   On: 12/23/2022 20:20     Medications:     Current Medications:  arformoterol  15 mcg Nebulization BID   aspirin  324 mg Oral NOW   Or   aspirin  300 mg Rectal NOW   aspirin EC  81 mg Oral Daily   budesonide (PULMICORT) nebulizer solution  0.5 mg Nebulization BID   Chlorhexidine Gluconate Cloth  6 each Topical Daily   clopidogrel  75 mg Oral Daily   docusate  100 mg Per Tube BID   EPINEPHrine       insulin aspart  0-9 Units Subcutaneous Q4H   levothyroxine  112 mcg Oral Q0600   methylPREDNISolone (SOLU-MEDROL) injection  80 mg Intravenous Q12H   nitroGLYCERIN       mouth rinse  15 mL Mouth Rinse Q2H   pantoprazole (PROTONIX) IV  40 mg Intravenous Daily   polyethylene glycol  17 g Per Tube Daily   revefenacin  175 mcg Nebulization Daily   sodium chloride flush  10 mL Intravenous Q12H   succinylcholine        Infusions:  [START ON December 23, 2022] ceFEPime (MAXIPIME) IV     fentaNYL infusion INTRAVENOUS 50 mcg/hr (12/24/2022 1302)   heparin 1,050 Units/hr (12/08/2022 1100)   metronidazole     midazolam 2 mg/hr (12/03/2022 1305)   norepinephrine (LEVOPHED) Adult infusion 7 mcg/min (12/02/2022 1100)      Patient Profile   67 y.o. male with history of CAD including subtotal LAD on cath in 2018 treated medically, COPD on nocturnal O2, LL lobectomy in 06/2021 for neuroendocrine cancer, oropharyngeal cancer in 2012 s/p chemoradiation.   Admitted with acute respiratory failure and acute MI c/b shock, acute CHF, severe MR and AKI  Assessment/Plan   Shock -Suspect primarily cardiogenic shock in setting of inferior MI w/ associated papillary muscle rupture resulting in severe MR and pulmonary edema -LHC with occluded RCA and LAD. RHC with elevated filling pressures and pulmonary hypertension with  mean PA 39 mmHg, PCWP 40 with V wave of 58 mmHg, Fick CO/CI 5.49/2.99. -Lactic acid 6.2>3.6>2.8>2.4 -Now on NE at 14 to maintain MAP.  -IABP placed in cath lab in effort to stabilize for consideration of high-risk MVR. Initially unable to reach family, after daughter contacted decision made to transition towards comfort measures -Will continue IABP overnight and allow family time to visit with patient.  -Palliative care consult  2. Inferior MI -Likely within last 1-2 days -Cath with likely subacute 100% RCA and CTO LAD -PCI of RCA not attempted d/t concern it would not help stabilize patient as he has ruptured papillary muscle   3. Acute diastolic CHF -TEE today: EF 55-60%, RV okay, ruptured posterior papillary muscle with torrential MR -D/t severe MR. Today's RHC as above -Diurese today until transition to comfort care. Give 40 mg lasix IV then lasix gtt at 10/hr  4. Severe MR -D/t papillary muscle rupture -Plan comfort care as above  5.  AKI -Baseline Scr seems to be 1.5 -Cr 1.70>2.73  in setting of shock  6. Acute respiratory failure -D/t acute pulmonary edema +/- possible PNA -Vent management per CCM     Length of Stay: 1  FINCH, LINDSAY N, PA-C  2022-12-27, 1:59 PM  Advanced Heart Failure Team Pager [phone] (M-F; 7a - 5p)  Please contact [facility] for night-coverage after hours (4p -7a ) and weekends on [email or web address]   Patient seen with PA, agree with the above note.   I saw this patient (patient is intubated) in the cath lab with Drs Herbie Baltimore, Leafy Ro, and Chandrasekhar.  He has a ruptured posterior papillary muscle by TEE with torrential mitral regurgitation.  This is likely due to completed inferior MI with occluded RCA, most likely several days ago.  He is not a candidate for Mitraclip (reviewed with structural heart colleagues).  He would be a very high risk surgical candidate for mitral valve repair/replacement with AKI (creatinine 2.7), underlying COPD on  oxygen at night, severe CAD, and history of neuroendocrine lung cancer with left lower lobectomy in 2023.  While trying to contact the daughter, IABP was placed.    Dr. Leafy Ro spoke with the patient's daughter, and they decided based on his prior wishes and the high risk nature of the surgery not to proceed with surgery.  He does not have good options for nonsurgical treatment, and plan will be to proceed with comfort care.  The IABP will be removed.    Marca Ancona 12/27/2022 5:42 PM

## 2022-12-10 NOTE — Progress Notes (Signed)
01-02-23 2052  Spiritual Encounters  Type of Visit Initial  Care provided to: Pt and family  Conversation partners present during encounter Nurse  Referral source Nurse (RN/NT/LPN)  Reason for visit End-of-life  OnCall Visit Yes   Chaplain responded to call from RN that Pt is being transitioned to comfort care and family needed spiritual support. Chaplain arrived to provide compassionate presence and empathetic listening.  Chaplain was asked to pray with family and complied.  When we went into the room, chaplain was asked to pray for Pt as well. Chaplain facilitated life review and storytelling to help family to being processing their grief.  After a good time of sharing memories, chaplain allowed family time to grieve and share in private.  Chaplain explained to family how to reach out should they desire spiritual support to return.  Chaplain services remain available by Spiritual Consult or for emergent cases, paging [phone]  Chaplain Raelene Bott, MDiv [email or web address] [phone]

## 2022-12-10 NOTE — CV Procedure (Signed)
Procedure: TEE  Sedation: Versed/Fentanyl  Indication: Mitral regurgitation  Findings: Please see echo section for full report.  Normal LV size with EF 55-60%.  There is basal to mid inferior and inferolateral akinesis.  Normal RV size and systolic function.  Moderate left atrial enlargement, normal right atrium.  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 There is systolic flow reversal in the pulmonary vein doppler signal.    [name] [name] 12/29/2022 2:55 PM

## 2022-12-10 NOTE — Progress Notes (Addendum)
eLink Physician-Brief Progress Note Patient Name: [name] [name] DOB: 17-Jul-1955 MRN: [id number]   Date of Service  12/08/2022  HPI/Events of Note  67 year old half pack per day smoker with COPD presented with respiratory distress, increased shortness of breath and wheezing for 24 hours with sudden onset.  Admitted to the ICU for acute COPD exacerbation with acute hypercapnic respiratory failure and acute pulmonary edema.  On presentation he is tachypneic, hypotensive with a MAP of 68 otherwise normal vitals.  Saturating 93% on FiO2 50%.  Initial results show severe mixed acidosis which improved with BiPAP therapy and now primarily metabolic acidosis with no anion gap.  Elevated creatinine to 1.7.  BNP elevated, troponin elevated, lactic acid elevated, leukocytosis to 25.  Thrombocytosis.  Blood cultures collected.  Left-sided pleural effusion with pulmonary edema on chest radiograph.    Progressively more hypotensive.  Intermittently with MAP of 50.  eICU Interventions  Initiate norepinephrine as needed to maintain MAP greater than 65.  Discontinue scheduled hydralazine.   Scheduled SVNs, steroids, and Lasix.  Hold additional diuresis in the setting of hypotension  Trend troponin till nadir.  IV heparin in place.  Repeat VBG with a.m. labs  DVT prophylaxis with therapeutic heparin GI prophylaxis not indicated   06014 -borderline blood pressures through peripheral IV norepinephrine.  Increase ceiling 10 to 15 mcg.  Day team to readdress potential central line.  Intervention Category Major Interventions: Hypotension - evaluation and management Evaluation Type: New Patient Evaluation  Terea Neubauer 11/30/2022, 12:57 AM

## 2022-12-10 NOTE — Plan of Care (Signed)
  Problem: Education: Goal: Knowledge of General Education information will improve Description Including pain rating scale, medication(s)/side effects and non-pharmacologic comfort measures Outcome: Progressing   

## 2022-12-10 NOTE — IPAL (Signed)
Interdisciplinary Goals of Care Family Meeting   Date carried out: 12/24/2022  Location of the meeting: Bedside  Member's involved: Physician, Bedside Registered Nurse, and Family Member or next of kin  Durable Power of Attorney or acting medical decision maker: Daughter, Zella Ball.    Discussion: We discussed goals of care for [name] [name] .  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 This is non survivable and is not surgically correctable given extensive comorbid conditions per Dr Leafy Ro. Per that conversation, patient had been in declining health and per conversation with Dr Delton Coombes, patient did not wish extraordinary or protracted measures. As such, the only therapeutic option is a transition to comfort care.  Will remove IABP in anticipation of compassionate extubation once family has come to see him. Continue versed and fentanyl infusions. Family aware that patient will not receive CPR if arrests.   Code status:   Code Status: Do not attempt resuscitation (DNR) - Comfort care   Disposition: In-patient comfort care  Time spent for the meeting: 10 min.    Lynnell Catalan, MD  12/07/2022, 5:31 PM

## 2022-12-10 NOTE — Progress Notes (Addendum)
NAME:  [name] [name], MRN:  [id number], DOB:  1956-02-19, LOS: 1 ADMISSION DATE:  12/20/22, CONSULTATION DATE:  11/30/2022  REFERRING MD:  Rodena Medin, EDP , CHIEF COMPLAINT: Respiratory distress on BiPAP  History of Present Illness:  67 year old half pack per day smoker with COPD presented with respiratory distress, increased shortness of breath and wheezing for 24 hours with sudden onset.  No preceding URI symptoms.  He used his home Symbicort and albuterol without benefit. Initial ABG was 7.0 7/59/206 , placed on BiPAP Chest x-ray showed interstitial asymmetric airspace disease in the right lung more than left with volume loss and elevated left hemidiaphragm consistent with left lobectomy, cannot rule out effusion BNP was high at 03/03/2005, troponins flat 518 and 636.  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 rate (!) 22, height 5\' 9"  (1.753 m), weight 68.6 kg, SpO2 97%.    FiO2 (%):  [50 %-100 %] 50 %   Intake/Output Summary (Last 24 hours) at 12/17/2022 0810 Last data filed at 12/30/2022 0700 Gross per 24 hour  Intake 432.34 ml  Output --  Net 432.34 ml   Filed Weights   20-Dec-2022 1821 12/01/2022 0046 12/28/2022 0500   Weight: 71.2 kg 68.6 kg 68.6 kg    Examination: General: Elderly gentleman comfortable on BiPAP HENT: Oropharynx clear, no secretions, no stridor Lungs: Bilateral basilar inspiratory crackles, more so on the right.  He has a prolonged expiratory phase but no wheezing at this time Cardiovascular: Regular, hyperdynamic, no murmur Abdomen: Soft, nondistended with positive bowel sounds Extremities: No edema Neuro: Awake, alert, interacts appropriately, follows commands  Chest x-ray 10/11 reviewed by me, asymmetric interstitial/alveolar infiltrate right much greater than left, persistent left pleural effusion  ECG 10/10 > significant ST depressions laterally-inferior T wave inversion  Resolved Hospital Problem list     Assessment & Plan:  Acute hypercarbic and hypoxemic respiratory failure Acute COPD exacerbation. Asymmetric pulmonary infiltrate right > left.  Consider CAP or aspiration pneumonia versus acute pulmonary edema especially in setting of apparent non-STEMI History throat cancer History neuroendocrine lung cancer, left lower lobectomy -Continue BiPAP ad lib. for now.  Hopefully he can have a break 10/11.  Repeat his ABG midday 10/11 -Corticosteroids: Solu-Medrol 80 mg every 12 -Brovana/Yupelri, budesonide.  Albuterol as needed -Empiric broad-spectrum antibiotics > Cefepime, vancomycin.  His MRSA screen was positive but MRSA pneumonia unlikely.  Given his renal function I am going to stop his vancomycin 10/11 -Empiric Lasix given on presentation, following chest x-ray, urine output, hemodynamics -Follow chest x-ray -Smoking cessation  CAD Acute non-STEMI (question type II, vs primary issue) Possible cardiogenic pulmonary edema -Appreciate cardiology evaluation and assistance. Discussed case this am. Challenging decision regarding L cath given renal fxn. Plan will be to transfer to [facility] in event that  cath needed.  -with norepi needs improving, will defer dobuta / milrinone.  Could reconsider based on formal echo results -Heparin started 10/11, plan continue -ASA + Plavix  -Metoprolol held -Continue to trend troponins for plateau -EKG now and follow -Echocardiogram ordered, pending  History of hypertension, initially hypertension on presentation Shock.  Evolving overnight after admission, norepinephrine added.  Consider cardiogenic shock, consider septic shock given possible right lower lobe pneumonia -Home BP regimen on hold (hydralazine, metoprolol) -Antibiotics as above -Treating possible ACS as above, await echocardiogram -May require CVC placement -Consider co-oximetry, does not currently have central access  Acute on chronic renal failure (baseline SCr ~1.5) -Careful with diuresis, may be unable to tolerate without inotropes -Follow urine output, BMP closely  PAD, right subclavian stenting -On his aspirin/Plavix  Hypothyroidism -Continue levothyroxine  Recent fall, left shoulder pain -Home oxycodone as ordered, careful with sedating meds  Goals of care -Discussed patient's wishes with him this morning 10/11.  He would want all aggressive care at least temporarily in order to address reversible processes.  This would include mechanical ventilation, ACLS, etc.  He does indicate that he would not want extended invasive care, vent, etc.    Best Practice (right click and "Reselect all SmartList Selections" daily)   Diet/type: NPO DVT prophylaxis: LMWH GI prophylaxis: N/A Lines: N/A Foley:  N/A Code Status:  full code Last date of multidisciplinary goals of care discussion [discussed with patient by Dr. Delton Coombes 10/11.  Full scope of care.  Patient would not want chronic vent, trach, dependent care]  Labs   CBC: Recent Labs  Lab 12/08/2022 1829 12/15/22 0330  WBC 25.3* 42.5*  NEUTROABS 20.9*  --   HGB 11.5* 10.9*  HCT 42.0 40.3  MCV 72.9* 74.5*  PLT 536* 555*    Basic Metabolic Panel: Recent Labs  Lab 12/15/2022 1829 15-Dec-2022 0520   NA 138 137  K 4.6 4.9  CL 107 103  CO2 19* 17*  GLUCOSE 178* 162*  BUN 16 30*  CREATININE 1.70* 2.73*  CALCIUM 8.8* 8.4*  MG  --  2.8*  PHOS  --  7.6*   GFR: Estimated Creatinine Clearance: 25.5 mL/min (A) (by C-G formula based on SCr of 2.73 mg/dL (H)). Recent Labs  Lab 12/15/2022 1829 12/17/2022 1911 12/05/2022 2031 12/27/2022 2326 12/15/2022 0330  PROCALCITON  --   --   --  1.61  --   WBC 25.3*  --   --   --  42.5*  LATICACIDVEN  --  6.2* 3.6*  --   --     Liver Function Tests: Recent Labs  Lab 12/16/2022 1829  AST 14*  ALT 13  ALKPHOS 99  BILITOT 0.6  PROT 7.2  ALBUMIN 3.3*   No results for input(s): "LIPASE", "AMYLASE" in the last 168 hours. No results for input(s): "AMMONIA" in the last 168 hours.  ABG    Component Value Date/Time   PHART 7.22 (L) 12/02/2022 2250   PCO2ART 41 12/15/2022 2250   PO2ART 99 12/22/2022 2250   HCO3 16.8 (L) 15-Dec-2022 0330   TCO2 17 (L) 03/02/2021 1811   ACIDBASEDEF 10.4 (H) December 15, 2022 0330   O2SAT 45.1 December 15, 2022 0330     Coagulation Profile: No results for input(s): "INR", "PROTIME" in the last 168 hours.  Cardiac Enzymes: No results for input(s): "CKTOTAL", "CKMB", "CKMBINDEX", "TROPONINI" in the last 168 hours.  HbA1C: Hgb A1c MFr Bld  Date/Time Value Ref Range Status  06/16/2021 02:00 PM 5.3 4.8 - 5.6 % Final  Comment:             Prediabetes: 5.7 - 6.4          Diabetes: >6.4          Glycemic control for adults with diabetes: <7.0   05/23/2020 02:15 PM 5.6 4.8 - 5.6 % Final    Comment:             Prediabetes: 5.7 - 6.4          Diabetes: >6.4          Glycemic control for adults with diabetes: <7.0     Critical care time: 40 min     Levy Pupa, MD, PhD 12-31-22, 8:10 AM [facility] [phone] or if no answer before 7:00PM call [phone] For any issues after 7:00PM please call eLink [phone]

## 2022-12-10 NOTE — Procedures (Signed)
Intubation Procedure Note  DENISE BRAMBLETT  [id number]  10/18/1955  Date:12/13/2022  Time:1:08 PM   Provider Performing:Whitt Auletta    Procedure: Intubation (31500)  Indication(s) Respiratory Failure  Consent Risks of the procedure as well as the alternatives and risks of each were explained to the patient and/or caregiver.  Consent for the procedure was obtained and is signed in the bedside chart   Anesthesia Etomidate, Versed, Fentanyl, and Rocuronium   Time Out Verified patient identification, verified procedure, site/side was marked, verified correct patient position, special equipment/implants available, medications/allergies/relevant history reviewed, required imaging and test results available.   Sterile Technique Usual hand hygeine, masks, and gloves were used   Procedure Description Patient positioned in bed supine.  Sedation given as noted above.  Patient was intubated with endotracheal tube using Glidescope.  View was Grade 1 full glottis .  Number of attempts was 1.  Colorimetric CO2 detector was consistent with tracheal placement.   Complications/Tolerance None; patient tolerated the procedure well. Chest X-ray is ordered to verify placement.  Lynnell Catalan, MD [facility] ICU Physician [facility]  Pager: [phone] Or Epic Secure Chat After hours: [phone].  12/27/2022, 1:08 PM

## 2022-12-11 LAB — BLOOD GAS, ARTERIAL
Acid-base deficit: 13.2 mmol/L — ABNORMAL HIGH (ref 0.0–2.0)
Acid-base deficit: 10.7 mmol/L — ABNORMAL HIGH (ref 0.0–2.0)
Bicarbonate: 17.1 mmol/L — ABNORMAL LOW (ref 20.0–28.0)
Bicarbonate: 16.8 mmol/L — ABNORMAL LOW (ref 20.0–28.0)
Delivery systems: POSITIVE
Drawn by: 560231
Drawn by: 56037
Expiratory PAP: 6 cm[H2O]
FIO2: 4 %
FIO2: 60 %
Inspiratory PAP: 12 cm[H2O]
O2 Saturation: 99.4 %
O2 Saturation: 97.5 %
Patient temperature: 37
Patient temperature: 36.4
pCO2 arterial: 59 mm[Hg] — ABNORMAL HIGH (ref 32–48)
pCO2 arterial: 41 mm[Hg] (ref 32–48)
pH, Arterial: 7.07 — CL (ref 7.35–7.45)
pH, Arterial: 7.22 — ABNORMAL LOW (ref 7.35–7.45)
pO2, Arterial: 206 mm[Hg] — ABNORMAL HIGH (ref 83–108)
pO2, Arterial: 99 mm[Hg] (ref 83–108)

## 2022-12-11 LAB — GLUCOSE, CAPILLARY
Glucose-Capillary: 152 mg/dL — ABNORMAL HIGH (ref 70–99)
Glucose-Capillary: 159 mg/dL — ABNORMAL HIGH (ref 70–99)

## 2022-12-11 MED ORDER — ACETAMINOPHEN 325 MG PO TABS: 650.0000 mg | ORAL_TABLET | Freq: Four times a day (QID) | ORAL | Status: DC | PRN

## 2022-12-11 MED ORDER — FENTANYL BOLUS VIA INFUSION
100.0000 ug | INTRAVENOUS | Status: DC | PRN
  Administered 2022-12-11: 100 ug via INTRAVENOUS

## 2022-12-11 MED ORDER — POLYVINYL ALCOHOL 1.4 % OP SOLN: 1.0000 [drp] | Freq: Four times a day (QID) | OPHTHALMIC | Status: DC | PRN

## 2022-12-11 MED ORDER — MIDAZOLAM BOLUS VIA INFUSION (WITHDRAWAL LIFE SUSTAINING TX): 2.0000 mg | INTRAVENOUS | Status: DC | PRN

## 2022-12-11 MED ORDER — GLYCOPYRROLATE 0.2 MG/ML IJ SOLN: 0.2000 mg | INTRAMUSCULAR | Status: DC | PRN

## 2022-12-11 MED ORDER — GLYCOPYRROLATE 1 MG PO TABS: 1.0000 mg | ORAL_TABLET | ORAL | Status: DC | PRN

## 2022-12-11 MED ORDER — MIDAZOLAM HCL 2 MG/2ML IJ SOLN: 1.0000 mg | INTRAMUSCULAR | Status: DC | PRN

## 2022-12-11 MED ORDER — FENTANYL 2500MCG IN NS 250ML (10MCG/ML) PREMIX INFUSION: 0.0000 ug/h | INTRAVENOUS | Status: DC

## 2022-12-11 MED ORDER — ACETAMINOPHEN 650 MG RE SUPP: 650.0000 mg | Freq: Four times a day (QID) | RECTAL | Status: DC | PRN

## 2022-12-11 MED ORDER — MIDAZOLAM-SODIUM CHLORIDE 100-0.9 MG/100ML-% IV SOLN: 0.0000 mg/h | INTRAVENOUS | Status: DC

## 2022-12-12 LAB — CULTURE, BLOOD (ROUTINE X 2)

## 2022-12-13 ENCOUNTER — Encounter (HOSPITAL_COMMUNITY): Payer: Self-pay | Admitting: Cardiology

## 2022-12-13 ENCOUNTER — Institutional Professional Consult (permissible substitution): Payer: Self-pay | Admitting: Clinical

## 2022-12-13 LAB — POCT I-STAT 7, (LYTES, BLD GAS, ICA,H+H)
Acid-base deficit: 7 mmol/L — ABNORMAL HIGH (ref 0.0–2.0)
Bicarbonate: 20.7 mmol/L (ref 20.0–28.0)
Calcium, Ion: 1.09 mmol/L — ABNORMAL LOW (ref 1.15–1.40)
HCT: 36 % — ABNORMAL LOW (ref 39.0–52.0)
Hemoglobin: 12.2 g/dL — ABNORMAL LOW (ref 13.0–17.0)
O2 Saturation: 66 %
Potassium: 4.7 mmol/L (ref 3.5–5.1)
Sodium: 140 mmol/L (ref 135–145)
TCO2: 22 mmol/L (ref 22–32)
pCO2 arterial: 52.2 mm[Hg] — ABNORMAL HIGH (ref 32–48)
pH, Arterial: 7.207 — ABNORMAL LOW (ref 7.35–7.45)
pO2, Arterial: 42 mm[Hg] — ABNORMAL LOW (ref 83–108)

## 2022-12-13 LAB — POCT I-STAT EG7
Acid-base deficit: 7 mmol/L — ABNORMAL HIGH (ref 0.0–2.0)
Bicarbonate: 21.7 mmol/L (ref 20.0–28.0)
Calcium, Ion: 1.1 mmol/L — ABNORMAL LOW (ref 1.15–1.40)
HCT: 35 % — ABNORMAL LOW (ref 39.0–52.0)
Hemoglobin: 11.9 g/dL — ABNORMAL LOW (ref 13.0–17.0)
O2 Saturation: 68 %
Potassium: 4.7 mmol/L (ref 3.5–5.1)
Sodium: 139 mmol/L (ref 135–145)
TCO2: 23 mmol/L (ref 22–32)
pCO2, Ven: 54.1 mm[Hg] (ref 44–60)
pH, Ven: 7.211 — ABNORMAL LOW (ref 7.25–7.43)
pO2, Ven: 43 mm[Hg] (ref 32–45)

## 2022-12-14 ENCOUNTER — Ambulatory Visit: Payer: Medicare Other

## 2022-12-14 ENCOUNTER — Other Ambulatory Visit (HOSPITAL_COMMUNITY): Payer: Medicare Other

## 2022-12-14 LAB — CULTURE, BLOOD (ROUTINE X 2): Culture: NO GROWTH

## 2022-12-27 ENCOUNTER — Ambulatory Visit: Payer: Self-pay | Admitting: Nurse Practitioner

## 2022-12-31 NOTE — Progress Notes (Signed)
Patient pronounced dead by Lloyd Huger, RN and Rogue Jury, RN at [phone]. Family remained at bedside and all questions/concerns were answered. [facility] and Patient Placement were notified.

## 2022-12-31 NOTE — Progress Notes (Signed)
Patient extubated per comfort order. Family at bedside.

## 2022-12-31 NOTE — Progress Notes (Signed)
Wasted 20 ml versed and 150 ml fentanyl with Eloise Harman

## 2022-12-31 NOTE — Discharge Summary (Signed)
of blood received in culture bottles Performed at [facility], [address] [place], [place] [address]    Culture   Final    NO GROWTH 5 DAYS Performed at [facility], [address] [place], [place] [address]    Report Status 12/14/2022 FINAL  Final  MRSA Next Gen by PCR, Nasal     Status: Abnormal   Collection Time: 12/01/2022 11:29 PM   Specimen: Nasal Mucosa; Nasal Swab  Result Value Ref Range Status   MRSA by PCR Next Gen Detected (A) NOT DETECTED Final    Comment: Performed at [facility], [address], [place], [place] [address]  Resp panel by RT-PCR (RSV, Flu A&B, Covid) Anterior Nasal Swab     Status: None   Collection Time: 12/02/2022  3:24 AM   Specimen: Anterior Nasal Swab  Result Value Ref Range Status   SARS Coronavirus 2 by RT PCR NEGATIVE NEGATIVE Final    Comment: (NOTE) SARS-CoV-2 target nucleic acids are NOT DETECTED.  The SARS-CoV-2 RNA is generally detectable in upper respiratory specimens during the acute phase of infection. The lowest concentration of SARS-CoV-2 viral copies this assay can detect is 138 copies/mL. A negative result does not preclude SARS-Cov-2 infection and should not be used as the sole basis for treatment or other patient management decisions. A negative result may occur with  improper specimen collection/handling, submission of specimen other than nasopharyngeal swab, presence of viral mutation(s) within the areas targeted by this assay, and inadequate number of viral copies(<138 copies/mL). A negative result must be combined with clinical observations, patient history, and  epidemiological information. The expected result is Negative.  Fact Sheet for Patients:  BloggerCourse.com  Fact Sheet for Healthcare Providers:  SeriousBroker.it  This test is no t yet approved or cleared by the [place] FDA and  has been authorized for detection and/or diagnosis of SARS-CoV-2 by FDA under an Emergency Use Authorization (EUA). This EUA will remain  in effect (meaning this test can be used) for the duration of the COVID-19 declaration under Section 564(b)(1) of the Act, 21 U.S.C.section 360bbb-3(b)(1), unless the authorization is terminated  or revoked sooner.       Influenza A by PCR NEGATIVE NEGATIVE Final   Influenza B by PCR NEGATIVE NEGATIVE Final    Comment: (NOTE) The Xpert Xpress SARS-CoV-2/FLU/RSV plus assay is intended as an aid in the diagnosis of influenza from Nasopharyngeal swab specimens and should not be used as a sole basis for treatment. Nasal washings and aspirates are unacceptable for Xpert Xpress SARS-CoV-2/FLU/RSV testing.  Fact Sheet for Patients: BloggerCourse.com  Fact Sheet for Healthcare Providers: SeriousBroker.it  This test is not yet approved or cleared by the [place] FDA and has been authorized for detection and/or diagnosis of SARS-CoV-2 by FDA under an Emergency Use Authorization (EUA). This EUA will remain in effect (meaning this test can be used) for the duration of the COVID-19 declaration under Section 564(b)(1) of the Act, 21 U.S.C. section 360bbb-3(b)(1), unless the authorization is terminated or revoked.     Resp Syncytial Virus by PCR NEGATIVE NEGATIVE Final    Comment: (NOTE) Fact Sheet for Patients: BloggerCourse.com  Fact Sheet for Healthcare Providers: SeriousBroker.it  This test is not yet approved or cleared by the [place] FDA and has been  authorized for detection and/or diagnosis of SARS-CoV-2 by FDA under an Emergency Use Authorization (EUA). This EUA will remain in effect (meaning this test can be used) for  of blood received in culture bottles Performed at [facility], [address] [place], [place] [address]    Culture   Final    NO GROWTH 5 DAYS Performed at [facility], [address] [place], [place] [address]    Report Status 12/14/2022 FINAL  Final  MRSA Next Gen by PCR, Nasal     Status: Abnormal   Collection Time: 12/01/2022 11:29 PM   Specimen: Nasal Mucosa; Nasal Swab  Result Value Ref Range Status   MRSA by PCR Next Gen Detected (A) NOT DETECTED Final    Comment: Performed at [facility], [address], [place], [place] [address]  Resp panel by RT-PCR (RSV, Flu A&B, Covid) Anterior Nasal Swab     Status: None   Collection Time: 12/02/2022  3:24 AM   Specimen: Anterior Nasal Swab  Result Value Ref Range Status   SARS Coronavirus 2 by RT PCR NEGATIVE NEGATIVE Final    Comment: (NOTE) SARS-CoV-2 target nucleic acids are NOT DETECTED.  The SARS-CoV-2 RNA is generally detectable in upper respiratory specimens during the acute phase of infection. The lowest concentration of SARS-CoV-2 viral copies this assay can detect is 138 copies/mL. A negative result does not preclude SARS-Cov-2 infection and should not be used as the sole basis for treatment or other patient management decisions. A negative result may occur with  improper specimen collection/handling, submission of specimen other than nasopharyngeal swab, presence of viral mutation(s) within the areas targeted by this assay, and inadequate number of viral copies(<138 copies/mL). A negative result must be combined with clinical observations, patient history, and  epidemiological information. The expected result is Negative.  Fact Sheet for Patients:  BloggerCourse.com  Fact Sheet for Healthcare Providers:  SeriousBroker.it  This test is no t yet approved or cleared by the [place] FDA and  has been authorized for detection and/or diagnosis of SARS-CoV-2 by FDA under an Emergency Use Authorization (EUA). This EUA will remain  in effect (meaning this test can be used) for the duration of the COVID-19 declaration under Section 564(b)(1) of the Act, 21 U.S.C.section 360bbb-3(b)(1), unless the authorization is terminated  or revoked sooner.       Influenza A by PCR NEGATIVE NEGATIVE Final   Influenza B by PCR NEGATIVE NEGATIVE Final    Comment: (NOTE) The Xpert Xpress SARS-CoV-2/FLU/RSV plus assay is intended as an aid in the diagnosis of influenza from Nasopharyngeal swab specimens and should not be used as a sole basis for treatment. Nasal washings and aspirates are unacceptable for Xpert Xpress SARS-CoV-2/FLU/RSV testing.  Fact Sheet for Patients: BloggerCourse.com  Fact Sheet for Healthcare Providers: SeriousBroker.it  This test is not yet approved or cleared by the [place] FDA and has been authorized for detection and/or diagnosis of SARS-CoV-2 by FDA under an Emergency Use Authorization (EUA). This EUA will remain in effect (meaning this test can be used) for the duration of the COVID-19 declaration under Section 564(b)(1) of the Act, 21 U.S.C. section 360bbb-3(b)(1), unless the authorization is terminated or revoked.     Resp Syncytial Virus by PCR NEGATIVE NEGATIVE Final    Comment: (NOTE) Fact Sheet for Patients: BloggerCourse.com  Fact Sheet for Healthcare Providers: SeriousBroker.it  This test is not yet approved or cleared by the [place] FDA and has been  authorized for detection and/or diagnosis of SARS-CoV-2 by FDA under an Emergency Use Authorization (EUA). This EUA will remain in effect (meaning this test can be used) for  V wave of 58 mmHg. => TEE performed while patient was in the Cath Lab revealed severe MR with ruptured posterior papillary muscle. Successful IABP placement via Left Femoral Artery. RECOMMENDATIONS With likely recent, but not within the last 12 to  24-hour occlusion of the RCA, attempted PCI of the RCA would not help the situation of ruptured papillary muscle and acute renal failure with cardiogenic shock.  Also, based on discussion with the family, will likely be moving towards comfort care. Patient will return to the ICU for management by the primary service and PCCM. We are using the radial artery sheath for arterial pressure measurement, this can be removed once it is decided that we were terminating care.  IVP is also at one-to-one and can be removed at the discretion of the primary team. Bryan Lemma, MD  ECHO TEE  Result Date: 12/24/2022    TRANSESOPHOGEAL ECHO REPORT   Patient Name:   [name] [name] Date of Exam: 12/06/2022 Medical Rec #:  [id number]      Height:       69.0 in Accession #:    [id number]     Weight:       151.2 lb Date of Birth:  January 26, 1956     BSA:          1.835 m Patient Age:    67 years       BP:           115/60 mmHg Patient Gender: M              HR:           100 bpm. Exam Location:  Inpatient Procedure: Transesophageal Echo, 3D Echo, Cardiac Doppler and Color Doppler Indications:     Mitral Valve Disorder  History:         Patient has prior history of Echocardiogram examinations, most                  recent 12/26/2022.  Sonographer:     Darlys Gales Referring Phys:  [phone] Eliot Ford MCLEAN Diagnosing Phys: Wilfred Lacy PROCEDURE: The transesophogeal probe was passed without difficulty through the esophogus of the patient. Sedation performed by different physician. The patient developed no complications during the procedure.  IMPRESSIONS  1. Left ventricular ejection fraction, by estimation, is 55 to 60%. The left ventricle has normal function. The left ventricle demonstrates regional wall motion abnormalities with basal to mid inferior and basal to mid inferolateral akinesis.  2. Right ventricular systolic function is normal. The right ventricular size is normal.  3. Left atrial size was moderately dilated. No left  atrial/left atrial appendage thrombus was detected.  4. The mitral valve is abnormal. 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 Systolic flow reversal in the pulmonary vein systolic doppler signal. No evidence of mitral stenosis.  5. The aortic valve is tricuspid. There is mild calcification of the aortic valve. Aortic valve regurgitation is not visualized. No aortic stenosis is present. FINDINGS  Left Ventricle: Left ventricular ejection fraction, by estimation, is 55 to 60%. The left ventricle has normal function. The left ventricle demonstrates regional wall motion abnormalities. The left ventricular internal cavity size was normal in size. Right Ventricle: The right ventricular size is normal. No increase in right ventricular wall thickness. Right ventricular systolic function is normal. Left Atrium: Left atrial size was moderately dilated. No left atrial/left atrial appendage thrombus was detected. Right Atrium: Right atrial size was normal in size. Pericardium: There is  of blood received in culture bottles Performed at [facility], [address] [place], [place] [address]    Culture   Final    NO GROWTH 5 DAYS Performed at [facility], [address] [place], [place] [address]    Report Status 12/14/2022 FINAL  Final  MRSA Next Gen by PCR, Nasal     Status: Abnormal   Collection Time: 12/01/2022 11:29 PM   Specimen: Nasal Mucosa; Nasal Swab  Result Value Ref Range Status   MRSA by PCR Next Gen Detected (A) NOT DETECTED Final    Comment: Performed at [facility], [address], [place], [place] [address]  Resp panel by RT-PCR (RSV, Flu A&B, Covid) Anterior Nasal Swab     Status: None   Collection Time: 12/02/2022  3:24 AM   Specimen: Anterior Nasal Swab  Result Value Ref Range Status   SARS Coronavirus 2 by RT PCR NEGATIVE NEGATIVE Final    Comment: (NOTE) SARS-CoV-2 target nucleic acids are NOT DETECTED.  The SARS-CoV-2 RNA is generally detectable in upper respiratory specimens during the acute phase of infection. The lowest concentration of SARS-CoV-2 viral copies this assay can detect is 138 copies/mL. A negative result does not preclude SARS-Cov-2 infection and should not be used as the sole basis for treatment or other patient management decisions. A negative result may occur with  improper specimen collection/handling, submission of specimen other than nasopharyngeal swab, presence of viral mutation(s) within the areas targeted by this assay, and inadequate number of viral copies(<138 copies/mL). A negative result must be combined with clinical observations, patient history, and  epidemiological information. The expected result is Negative.  Fact Sheet for Patients:  BloggerCourse.com  Fact Sheet for Healthcare Providers:  SeriousBroker.it  This test is no t yet approved or cleared by the [place] FDA and  has been authorized for detection and/or diagnosis of SARS-CoV-2 by FDA under an Emergency Use Authorization (EUA). This EUA will remain  in effect (meaning this test can be used) for the duration of the COVID-19 declaration under Section 564(b)(1) of the Act, 21 U.S.C.section 360bbb-3(b)(1), unless the authorization is terminated  or revoked sooner.       Influenza A by PCR NEGATIVE NEGATIVE Final   Influenza B by PCR NEGATIVE NEGATIVE Final    Comment: (NOTE) The Xpert Xpress SARS-CoV-2/FLU/RSV plus assay is intended as an aid in the diagnosis of influenza from Nasopharyngeal swab specimens and should not be used as a sole basis for treatment. Nasal washings and aspirates are unacceptable for Xpert Xpress SARS-CoV-2/FLU/RSV testing.  Fact Sheet for Patients: BloggerCourse.com  Fact Sheet for Healthcare Providers: SeriousBroker.it  This test is not yet approved or cleared by the [place] FDA and has been authorized for detection and/or diagnosis of SARS-CoV-2 by FDA under an Emergency Use Authorization (EUA). This EUA will remain in effect (meaning this test can be used) for the duration of the COVID-19 declaration under Section 564(b)(1) of the Act, 21 U.S.C. section 360bbb-3(b)(1), unless the authorization is terminated or revoked.     Resp Syncytial Virus by PCR NEGATIVE NEGATIVE Final    Comment: (NOTE) Fact Sheet for Patients: BloggerCourse.com  Fact Sheet for Healthcare Providers: SeriousBroker.it  This test is not yet approved or cleared by the [place] FDA and has been  authorized for detection and/or diagnosis of SARS-CoV-2 by FDA under an Emergency Use Authorization (EUA). This EUA will remain in effect (meaning this test can be used) for  V wave of 58 mmHg. => TEE performed while patient was in the Cath Lab revealed severe MR with ruptured posterior papillary muscle. Successful IABP placement via Left Femoral Artery. RECOMMENDATIONS With likely recent, but not within the last 12 to  24-hour occlusion of the RCA, attempted PCI of the RCA would not help the situation of ruptured papillary muscle and acute renal failure with cardiogenic shock.  Also, based on discussion with the family, will likely be moving towards comfort care. Patient will return to the ICU for management by the primary service and PCCM. We are using the radial artery sheath for arterial pressure measurement, this can be removed once it is decided that we were terminating care.  IVP is also at one-to-one and can be removed at the discretion of the primary team. Bryan Lemma, MD  ECHO TEE  Result Date: 12/24/2022    TRANSESOPHOGEAL ECHO REPORT   Patient Name:   [name] [name] Date of Exam: 12/06/2022 Medical Rec #:  [id number]      Height:       69.0 in Accession #:    [id number]     Weight:       151.2 lb Date of Birth:  January 26, 1956     BSA:          1.835 m Patient Age:    67 years       BP:           115/60 mmHg Patient Gender: M              HR:           100 bpm. Exam Location:  Inpatient Procedure: Transesophageal Echo, 3D Echo, Cardiac Doppler and Color Doppler Indications:     Mitral Valve Disorder  History:         Patient has prior history of Echocardiogram examinations, most                  recent 12/26/2022.  Sonographer:     Darlys Gales Referring Phys:  [phone] Eliot Ford MCLEAN Diagnosing Phys: Wilfred Lacy PROCEDURE: The transesophogeal probe was passed without difficulty through the esophogus of the patient. Sedation performed by different physician. The patient developed no complications during the procedure.  IMPRESSIONS  1. Left ventricular ejection fraction, by estimation, is 55 to 60%. The left ventricle has normal function. The left ventricle demonstrates regional wall motion abnormalities with basal to mid inferior and basal to mid inferolateral akinesis.  2. Right ventricular systolic function is normal. The right ventricular size is normal.  3. Left atrial size was moderately dilated. No left  atrial/left atrial appendage thrombus was detected.  4. The mitral valve is abnormal. 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 Systolic flow reversal in the pulmonary vein systolic doppler signal. No evidence of mitral stenosis.  5. The aortic valve is tricuspid. There is mild calcification of the aortic valve. Aortic valve regurgitation is not visualized. No aortic stenosis is present. FINDINGS  Left Ventricle: Left ventricular ejection fraction, by estimation, is 55 to 60%. The left ventricle has normal function. The left ventricle demonstrates regional wall motion abnormalities. The left ventricular internal cavity size was normal in size. Right Ventricle: The right ventricular size is normal. No increase in right ventricular wall thickness. Right ventricular systolic function is normal. Left Atrium: Left atrial size was moderately dilated. No left atrial/left atrial appendage thrombus was detected. Right Atrium: Right atrial size was normal in size. Pericardium: There is  V wave of 58 mmHg. => TEE performed while patient was in the Cath Lab revealed severe MR with ruptured posterior papillary muscle. Successful IABP placement via Left Femoral Artery. RECOMMENDATIONS With likely recent, but not within the last 12 to  24-hour occlusion of the RCA, attempted PCI of the RCA would not help the situation of ruptured papillary muscle and acute renal failure with cardiogenic shock.  Also, based on discussion with the family, will likely be moving towards comfort care. Patient will return to the ICU for management by the primary service and PCCM. We are using the radial artery sheath for arterial pressure measurement, this can be removed once it is decided that we were terminating care.  IVP is also at one-to-one and can be removed at the discretion of the primary team. Bryan Lemma, MD  ECHO TEE  Result Date: 12/24/2022    TRANSESOPHOGEAL ECHO REPORT   Patient Name:   [name] [name] Date of Exam: 12/06/2022 Medical Rec #:  [id number]      Height:       69.0 in Accession #:    [id number]     Weight:       151.2 lb Date of Birth:  January 26, 1956     BSA:          1.835 m Patient Age:    67 years       BP:           115/60 mmHg Patient Gender: M              HR:           100 bpm. Exam Location:  Inpatient Procedure: Transesophageal Echo, 3D Echo, Cardiac Doppler and Color Doppler Indications:     Mitral Valve Disorder  History:         Patient has prior history of Echocardiogram examinations, most                  recent 12/26/2022.  Sonographer:     Darlys Gales Referring Phys:  [phone] Eliot Ford MCLEAN Diagnosing Phys: Wilfred Lacy PROCEDURE: The transesophogeal probe was passed without difficulty through the esophogus of the patient. Sedation performed by different physician. The patient developed no complications during the procedure.  IMPRESSIONS  1. Left ventricular ejection fraction, by estimation, is 55 to 60%. The left ventricle has normal function. The left ventricle demonstrates regional wall motion abnormalities with basal to mid inferior and basal to mid inferolateral akinesis.  2. Right ventricular systolic function is normal. The right ventricular size is normal.  3. Left atrial size was moderately dilated. No left  atrial/left atrial appendage thrombus was detected.  4. The mitral valve is abnormal. 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 Systolic flow reversal in the pulmonary vein systolic doppler signal. No evidence of mitral stenosis.  5. The aortic valve is tricuspid. There is mild calcification of the aortic valve. Aortic valve regurgitation is not visualized. No aortic stenosis is present. FINDINGS  Left Ventricle: Left ventricular ejection fraction, by estimation, is 55 to 60%. The left ventricle has normal function. The left ventricle demonstrates regional wall motion abnormalities. The left ventricular internal cavity size was normal in size. Right Ventricle: The right ventricular size is normal. No increase in right ventricular wall thickness. Right ventricular systolic function is normal. Left Atrium: Left atrial size was moderately dilated. No left atrial/left atrial appendage thrombus was detected. Right Atrium: Right atrial size was normal in size. Pericardium: There is  V wave of 58 mmHg. => TEE performed while patient was in the Cath Lab revealed severe MR with ruptured posterior papillary muscle. Successful IABP placement via Left Femoral Artery. RECOMMENDATIONS With likely recent, but not within the last 12 to  24-hour occlusion of the RCA, attempted PCI of the RCA would not help the situation of ruptured papillary muscle and acute renal failure with cardiogenic shock.  Also, based on discussion with the family, will likely be moving towards comfort care. Patient will return to the ICU for management by the primary service and PCCM. We are using the radial artery sheath for arterial pressure measurement, this can be removed once it is decided that we were terminating care.  IVP is also at one-to-one and can be removed at the discretion of the primary team. Bryan Lemma, MD  ECHO TEE  Result Date: 12/24/2022    TRANSESOPHOGEAL ECHO REPORT   Patient Name:   [name] [name] Date of Exam: 12/06/2022 Medical Rec #:  [id number]      Height:       69.0 in Accession #:    [id number]     Weight:       151.2 lb Date of Birth:  January 26, 1956     BSA:          1.835 m Patient Age:    67 years       BP:           115/60 mmHg Patient Gender: M              HR:           100 bpm. Exam Location:  Inpatient Procedure: Transesophageal Echo, 3D Echo, Cardiac Doppler and Color Doppler Indications:     Mitral Valve Disorder  History:         Patient has prior history of Echocardiogram examinations, most                  recent 12/26/2022.  Sonographer:     Darlys Gales Referring Phys:  [phone] Eliot Ford MCLEAN Diagnosing Phys: Wilfred Lacy PROCEDURE: The transesophogeal probe was passed without difficulty through the esophogus of the patient. Sedation performed by different physician. The patient developed no complications during the procedure.  IMPRESSIONS  1. Left ventricular ejection fraction, by estimation, is 55 to 60%. The left ventricle has normal function. The left ventricle demonstrates regional wall motion abnormalities with basal to mid inferior and basal to mid inferolateral akinesis.  2. Right ventricular systolic function is normal. The right ventricular size is normal.  3. Left atrial size was moderately dilated. No left  atrial/left atrial appendage thrombus was detected.  4. The mitral valve is abnormal. 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 Systolic flow reversal in the pulmonary vein systolic doppler signal. No evidence of mitral stenosis.  5. The aortic valve is tricuspid. There is mild calcification of the aortic valve. Aortic valve regurgitation is not visualized. No aortic stenosis is present. FINDINGS  Left Ventricle: Left ventricular ejection fraction, by estimation, is 55 to 60%. The left ventricle has normal function. The left ventricle demonstrates regional wall motion abnormalities. The left ventricular internal cavity size was normal in size. Right Ventricle: The right ventricular size is normal. No increase in right ventricular wall thickness. Right ventricular systolic function is normal. Left Atrium: Left atrial size was moderately dilated. No left atrial/left atrial appendage thrombus was detected. Right Atrium: Right atrial size was normal in size. Pericardium: There is  V wave of 58 mmHg. => TEE performed while patient was in the Cath Lab revealed severe MR with ruptured posterior papillary muscle. Successful IABP placement via Left Femoral Artery. RECOMMENDATIONS With likely recent, but not within the last 12 to  24-hour occlusion of the RCA, attempted PCI of the RCA would not help the situation of ruptured papillary muscle and acute renal failure with cardiogenic shock.  Also, based on discussion with the family, will likely be moving towards comfort care. Patient will return to the ICU for management by the primary service and PCCM. We are using the radial artery sheath for arterial pressure measurement, this can be removed once it is decided that we were terminating care.  IVP is also at one-to-one and can be removed at the discretion of the primary team. Bryan Lemma, MD  ECHO TEE  Result Date: 12/24/2022    TRANSESOPHOGEAL ECHO REPORT   Patient Name:   [name] [name] Date of Exam: 12/06/2022 Medical Rec #:  [id number]      Height:       69.0 in Accession #:    [id number]     Weight:       151.2 lb Date of Birth:  January 26, 1956     BSA:          1.835 m Patient Age:    67 years       BP:           115/60 mmHg Patient Gender: M              HR:           100 bpm. Exam Location:  Inpatient Procedure: Transesophageal Echo, 3D Echo, Cardiac Doppler and Color Doppler Indications:     Mitral Valve Disorder  History:         Patient has prior history of Echocardiogram examinations, most                  recent 12/26/2022.  Sonographer:     Darlys Gales Referring Phys:  [phone] Eliot Ford MCLEAN Diagnosing Phys: Wilfred Lacy PROCEDURE: The transesophogeal probe was passed without difficulty through the esophogus of the patient. Sedation performed by different physician. The patient developed no complications during the procedure.  IMPRESSIONS  1. Left ventricular ejection fraction, by estimation, is 55 to 60%. The left ventricle has normal function. The left ventricle demonstrates regional wall motion abnormalities with basal to mid inferior and basal to mid inferolateral akinesis.  2. Right ventricular systolic function is normal. The right ventricular size is normal.  3. Left atrial size was moderately dilated. No left  atrial/left atrial appendage thrombus was detected.  4. The mitral valve is abnormal. 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 Systolic flow reversal in the pulmonary vein systolic doppler signal. No evidence of mitral stenosis.  5. The aortic valve is tricuspid. There is mild calcification of the aortic valve. Aortic valve regurgitation is not visualized. No aortic stenosis is present. FINDINGS  Left Ventricle: Left ventricular ejection fraction, by estimation, is 55 to 60%. The left ventricle has normal function. The left ventricle demonstrates regional wall motion abnormalities. The left ventricular internal cavity size was normal in size. Right Ventricle: The right ventricular size is normal. No increase in right ventricular wall thickness. Right ventricular systolic function is normal. Left Atrium: Left atrial size was moderately dilated. No left atrial/left atrial appendage thrombus was detected. Right Atrium: Right atrial size was normal in size. Pericardium: There is

## 2022-12-31 NOTE — Progress Notes (Signed)
   Notes reviewed. Patient now switched to comfort care.  AHF team will sign-off. Please call with questions   Arvilla Meres, MD  10:07 AM

## 2022-12-31 DEATH — deceased

## 2023-01-12 ENCOUNTER — Ambulatory Visit: Payer: Medicare PPO | Admitting: Cardiovascular Disease

## 2023-01-18 ENCOUNTER — Other Ambulatory Visit (HOSPITAL_COMMUNITY): Payer: Self-pay

## 2023-01-18 ENCOUNTER — Ambulatory Visit: Payer: Medicare Other

## 2023-05-04 ENCOUNTER — Ambulatory Visit: Payer: Self-pay | Admitting: Nurse Practitioner
